# Patient Record
Sex: Male | Born: 1950 | Race: Black or African American | Hispanic: No | Smoking: Current every day smoker
Health system: Southern US, Community
[De-identification: ages and names within clinical notes are randomized; demographics above are authoritative.]

## PROBLEM LIST (undated history)

## (undated) DIAGNOSIS — Z87442 Personal history of urinary calculi: Secondary | ICD-10-CM

## (undated) DIAGNOSIS — M545 Low back pain, unspecified: Secondary | ICD-10-CM

## (undated) DIAGNOSIS — E7800 Pure hypercholesterolemia, unspecified: Secondary | ICD-10-CM

## (undated) DIAGNOSIS — I219 Acute myocardial infarction, unspecified: Secondary | ICD-10-CM

## (undated) DIAGNOSIS — I10 Essential (primary) hypertension: Secondary | ICD-10-CM

## (undated) DIAGNOSIS — I509 Heart failure, unspecified: Secondary | ICD-10-CM

## (undated) DIAGNOSIS — C3490 Malignant neoplasm of unspecified part of unspecified bronchus or lung: Secondary | ICD-10-CM

## (undated) DIAGNOSIS — Z95810 Presence of automatic (implantable) cardiac defibrillator: Secondary | ICD-10-CM

## (undated) DIAGNOSIS — N289 Disorder of kidney and ureter, unspecified: Secondary | ICD-10-CM

## (undated) DIAGNOSIS — I5020 Unspecified systolic (congestive) heart failure: Secondary | ICD-10-CM

## (undated) DIAGNOSIS — E119 Type 2 diabetes mellitus without complications: Secondary | ICD-10-CM

## (undated) DIAGNOSIS — Z720 Tobacco use: Secondary | ICD-10-CM

## (undated) DIAGNOSIS — Z923 Personal history of irradiation: Secondary | ICD-10-CM

## (undated) DIAGNOSIS — I209 Angina pectoris, unspecified: Secondary | ICD-10-CM

## (undated) DIAGNOSIS — G8929 Other chronic pain: Secondary | ICD-10-CM

## (undated) HISTORY — PX: CARDIAC CATHETERIZATION: SHX172

## (undated) HISTORY — PX: KNEE SURGERY: SHX244

## (undated) HISTORY — PX: INGUINAL HERNIA REPAIR: SUR1180

---

## 1980-12-14 DIAGNOSIS — Z87442 Personal history of urinary calculi: Secondary | ICD-10-CM

## 1980-12-14 HISTORY — DX: Personal history of urinary calculi: Z87.442

## 1998-10-29 ENCOUNTER — Inpatient Hospital Stay (HOSPITAL_COMMUNITY): Admission: EM | Admit: 1998-10-29 | Discharge: 1998-11-04 | Payer: Self-pay | Admitting: Emergency Medicine

## 1998-10-29 ENCOUNTER — Encounter: Payer: Self-pay | Admitting: Emergency Medicine

## 1999-03-08 ENCOUNTER — Emergency Department (HOSPITAL_COMMUNITY): Admission: EM | Admit: 1999-03-08 | Discharge: 1999-03-08 | Payer: Self-pay | Admitting: Emergency Medicine

## 1999-03-26 ENCOUNTER — Emergency Department (HOSPITAL_COMMUNITY): Admission: EM | Admit: 1999-03-26 | Discharge: 1999-03-26 | Payer: Self-pay | Admitting: Emergency Medicine

## 1999-03-26 ENCOUNTER — Encounter: Payer: Self-pay | Admitting: Emergency Medicine

## 1999-08-25 ENCOUNTER — Emergency Department (HOSPITAL_COMMUNITY): Admission: EM | Admit: 1999-08-25 | Discharge: 1999-08-25 | Payer: Self-pay | Admitting: Emergency Medicine

## 1999-08-27 ENCOUNTER — Emergency Department (HOSPITAL_COMMUNITY): Admission: EM | Admit: 1999-08-27 | Discharge: 1999-08-27 | Payer: Self-pay | Admitting: Emergency Medicine

## 2000-08-18 ENCOUNTER — Encounter: Payer: Self-pay | Admitting: Occupational Medicine

## 2000-08-18 ENCOUNTER — Encounter: Admission: RE | Admit: 2000-08-18 | Discharge: 2000-08-18 | Payer: Self-pay | Admitting: Occupational Medicine

## 2001-02-01 ENCOUNTER — Ambulatory Visit (HOSPITAL_BASED_OUTPATIENT_CLINIC_OR_DEPARTMENT_OTHER): Admission: RE | Admit: 2001-02-01 | Discharge: 2001-02-01 | Payer: Self-pay | Admitting: Orthopedic Surgery

## 2002-06-14 ENCOUNTER — Emergency Department (HOSPITAL_COMMUNITY): Admission: EM | Admit: 2002-06-14 | Discharge: 2002-06-14 | Payer: Self-pay | Admitting: Emergency Medicine

## 2004-10-19 ENCOUNTER — Emergency Department (HOSPITAL_COMMUNITY): Admission: EM | Admit: 2004-10-19 | Discharge: 2004-10-20 | Payer: Self-pay | Admitting: Emergency Medicine

## 2004-10-19 IMAGING — CR DG CERVICAL SPINE COMPLETE 4+V
7 series · 7 of 7 positions shown · non-contrast
Comparison: None.

CLINICAL DATA: Left arm and shoulder pain.  No history of injury. 
 CERVICAL SPINE, COMPLETE [DATE]:

[view not recorded (1 of 7)]
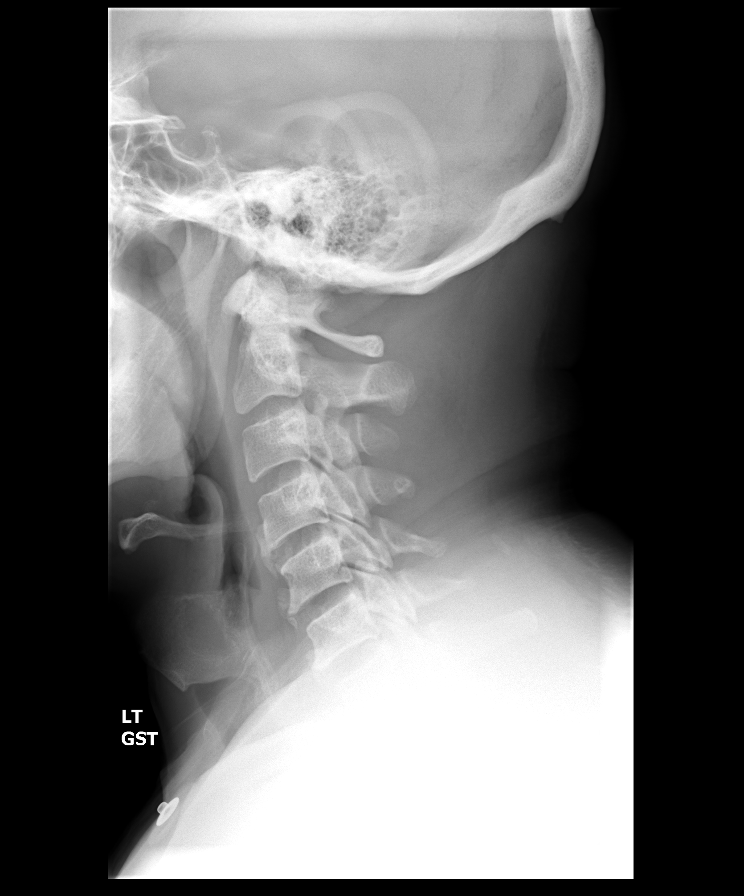

[view not recorded (2 of 7)]
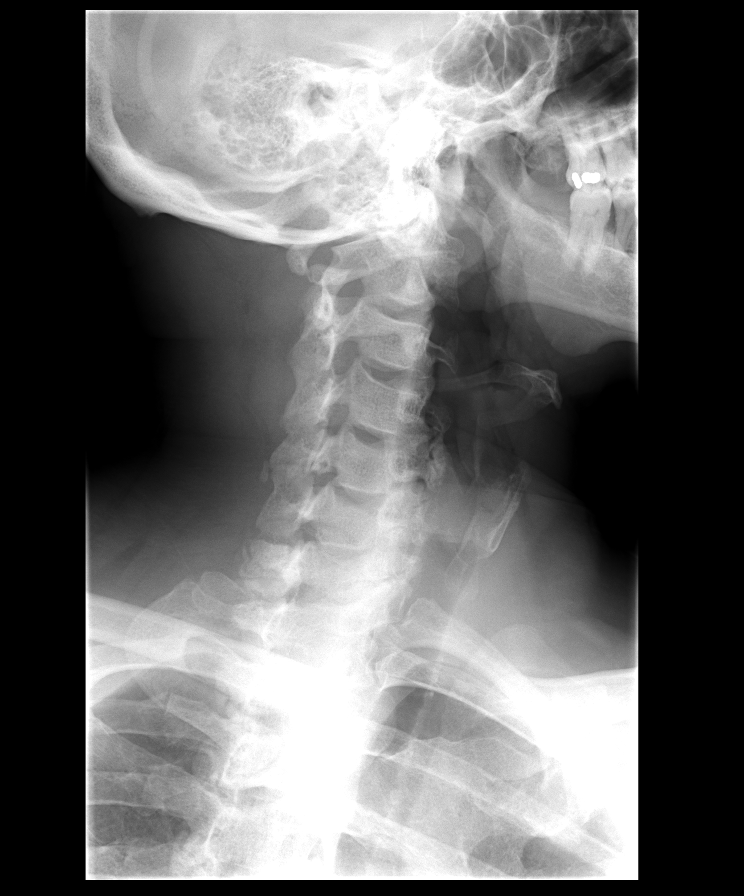

[view not recorded (3 of 7)]
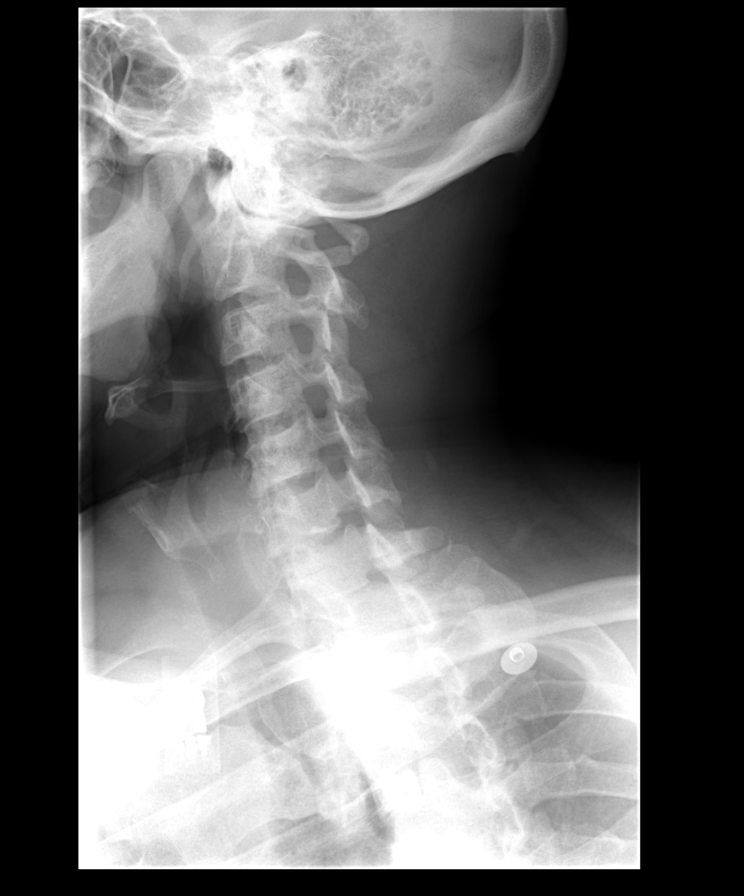

[view not recorded (4 of 7)]
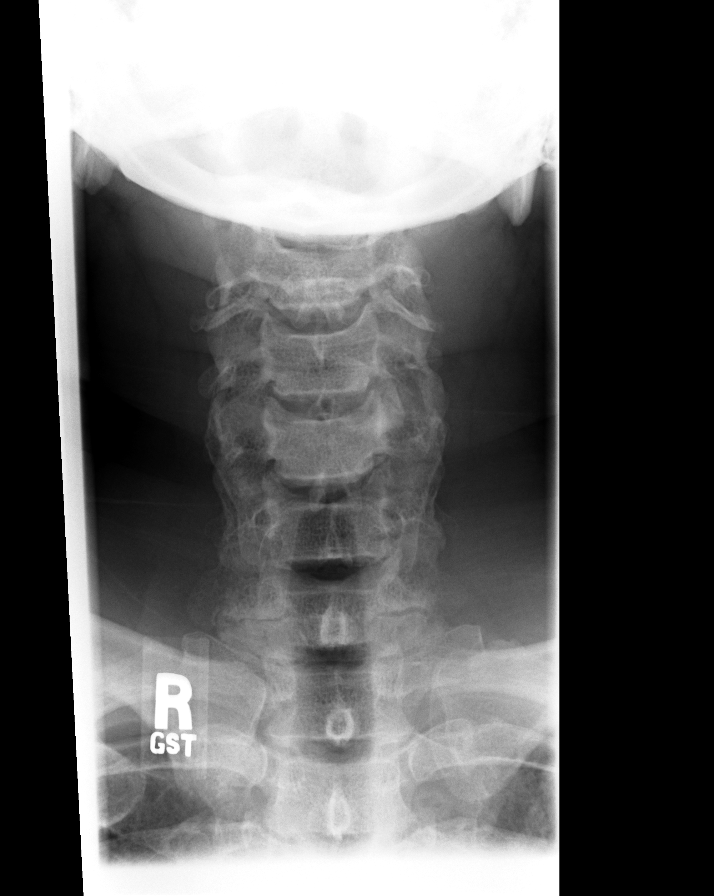

[view not recorded (5 of 7)]
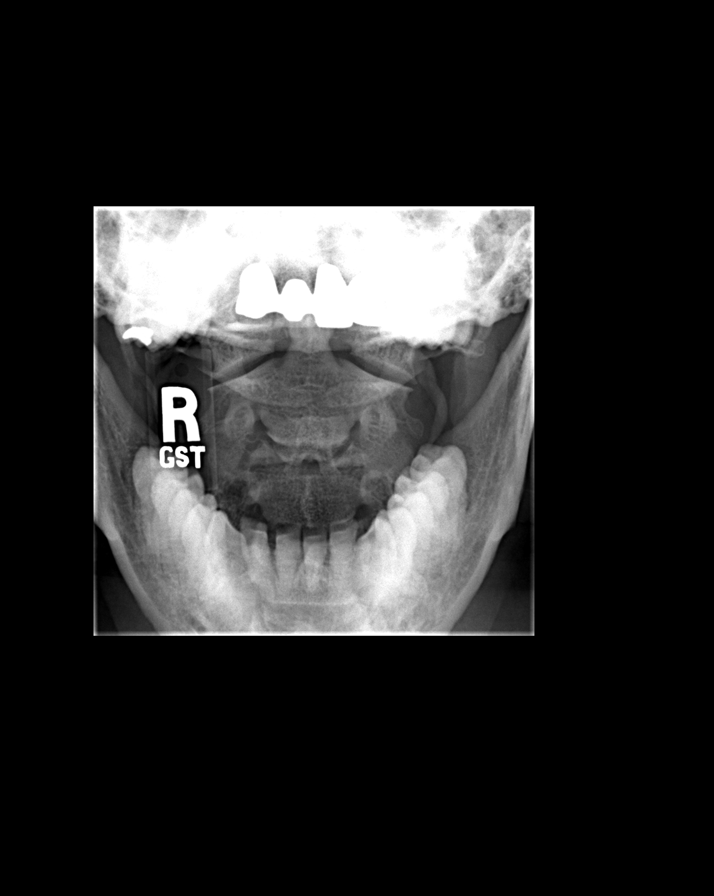

[view not recorded (6 of 7)]
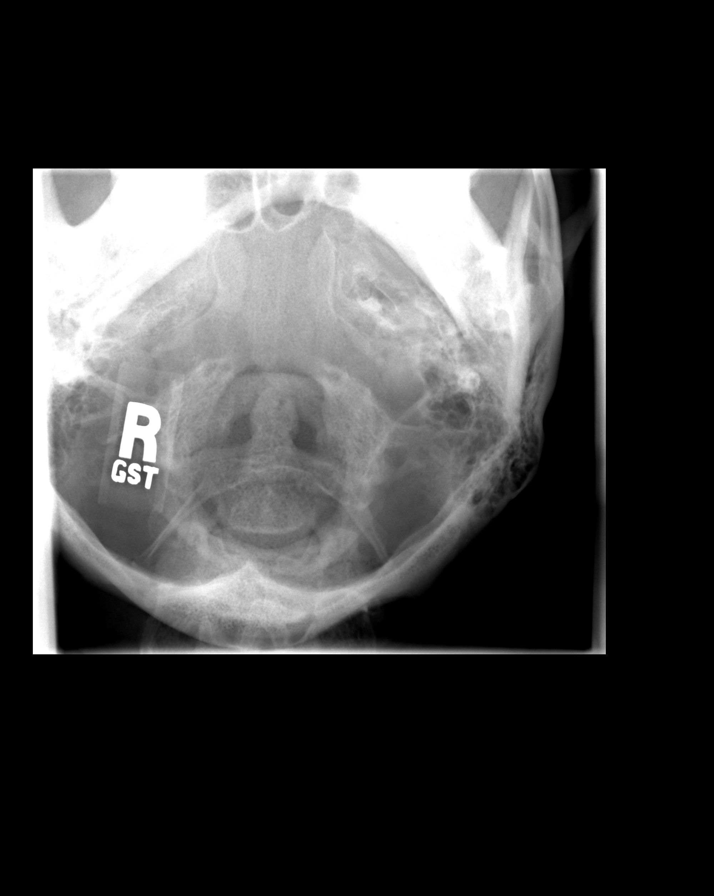

[view not recorded (7 of 7)]
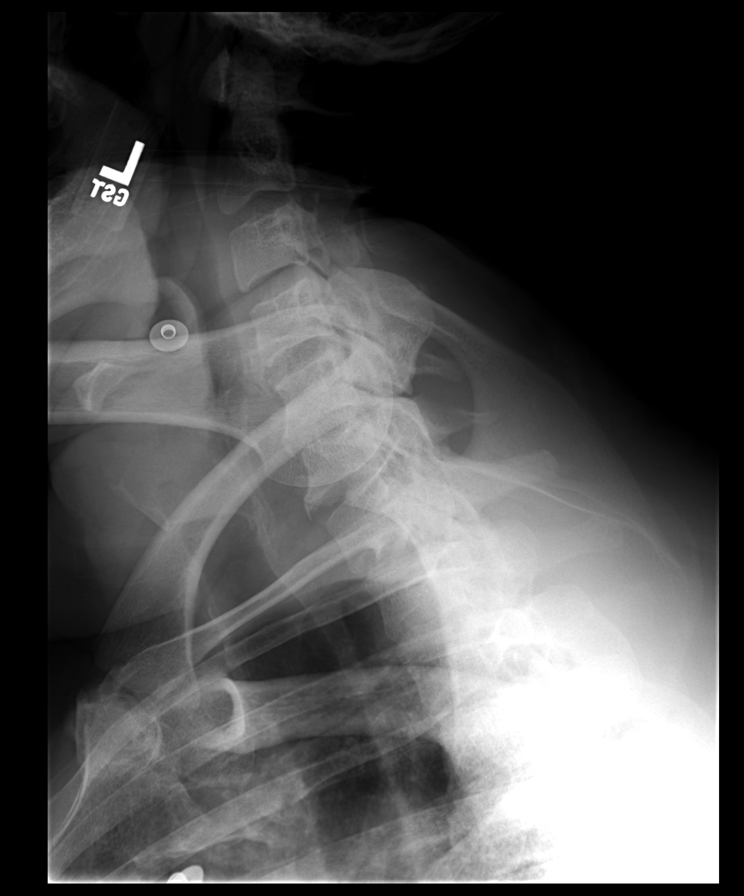

[7 of 7 positions shown; findings below may reference images not displayed]

FINDINGS: Five-view exam of the cervical spine shows no evidence for an acute fracture or subluxation.  Alignment is normal.  The intervertebral disc spaces are preserved.  Facet degeneration characterizes the lower cervical spine.  There may be narrowing of the right C5-6 and C6-7 neural foramen, although the apparent narrowing may be related to patient position.  The left neural foramen appear patent.  
 No evidence for prevertebral soft tissue thickening.
IMPRESSION: No acute bony abnormality.

## 2004-10-19 IMAGING — CR DG CHEST 2V
2 series · 2 of 2 positions shown · non-contrast
Comparison: None.

CLINICAL DATA: Shortness of breath and left shoulder pain.

[view not recorded (1 of 2)]
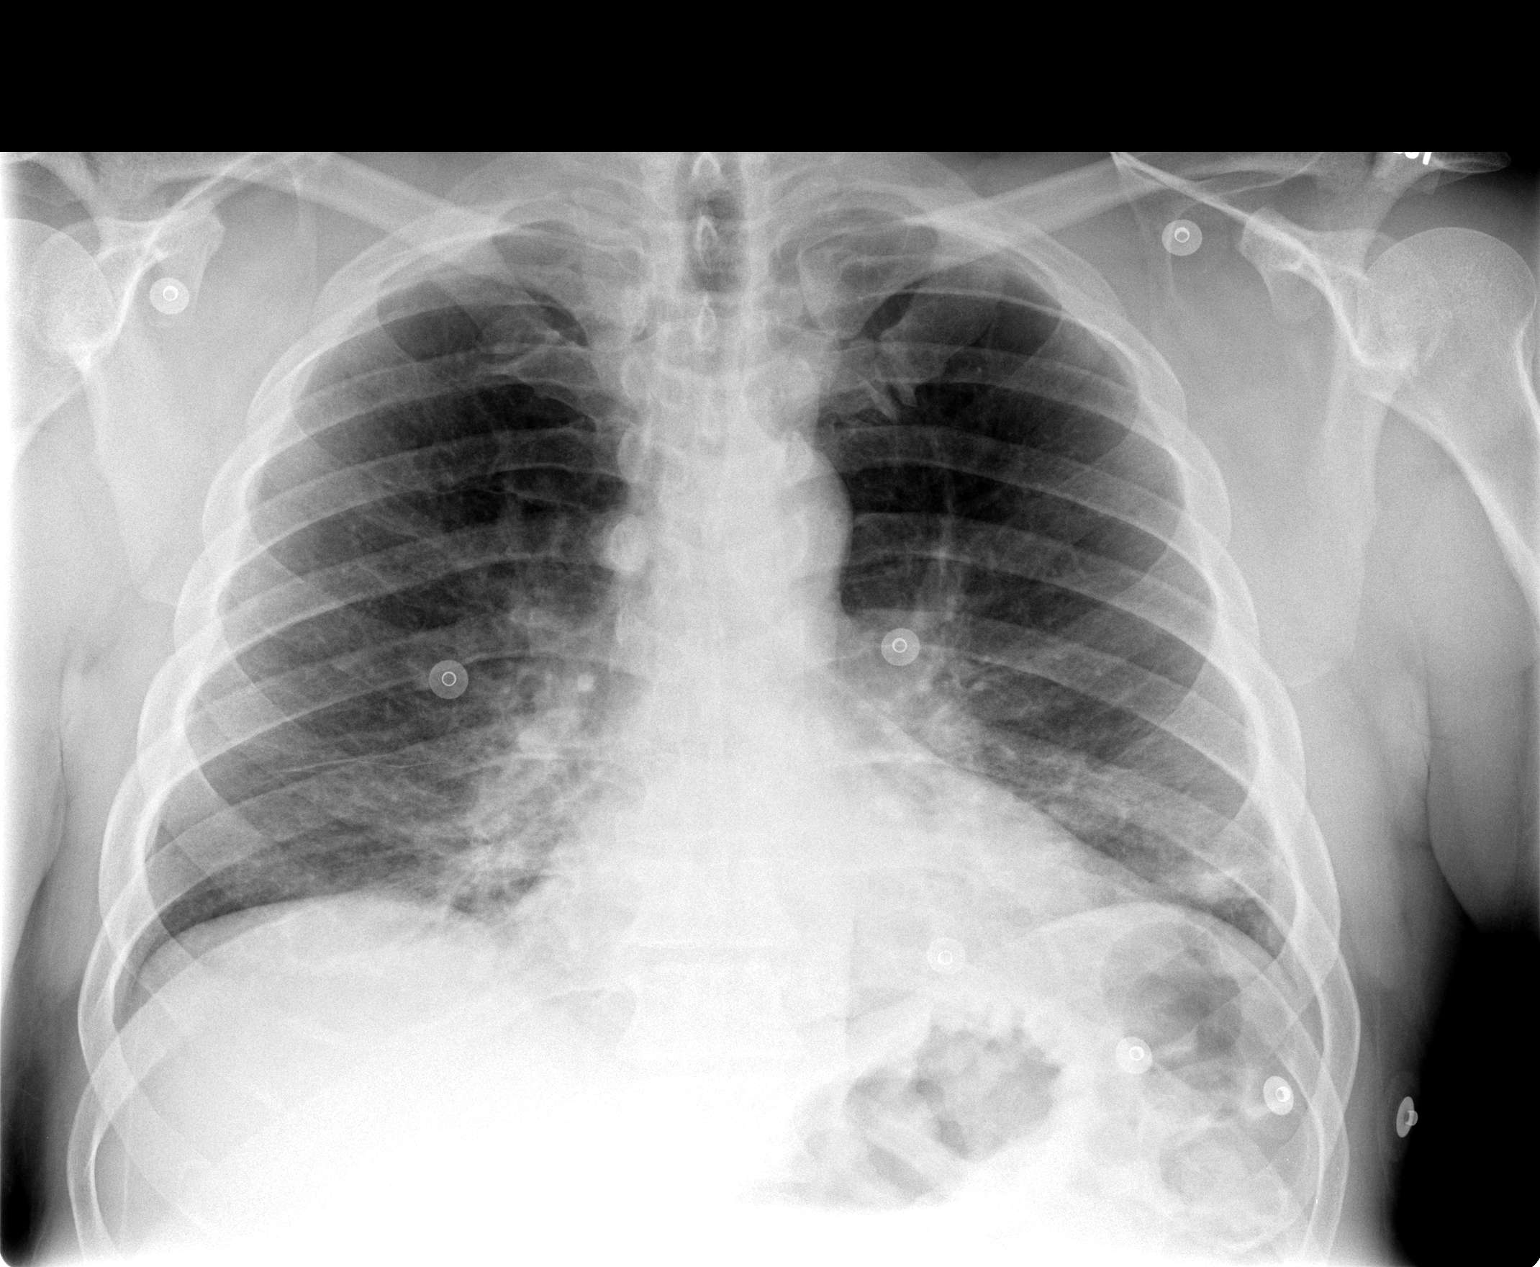

[view not recorded (2 of 2)]
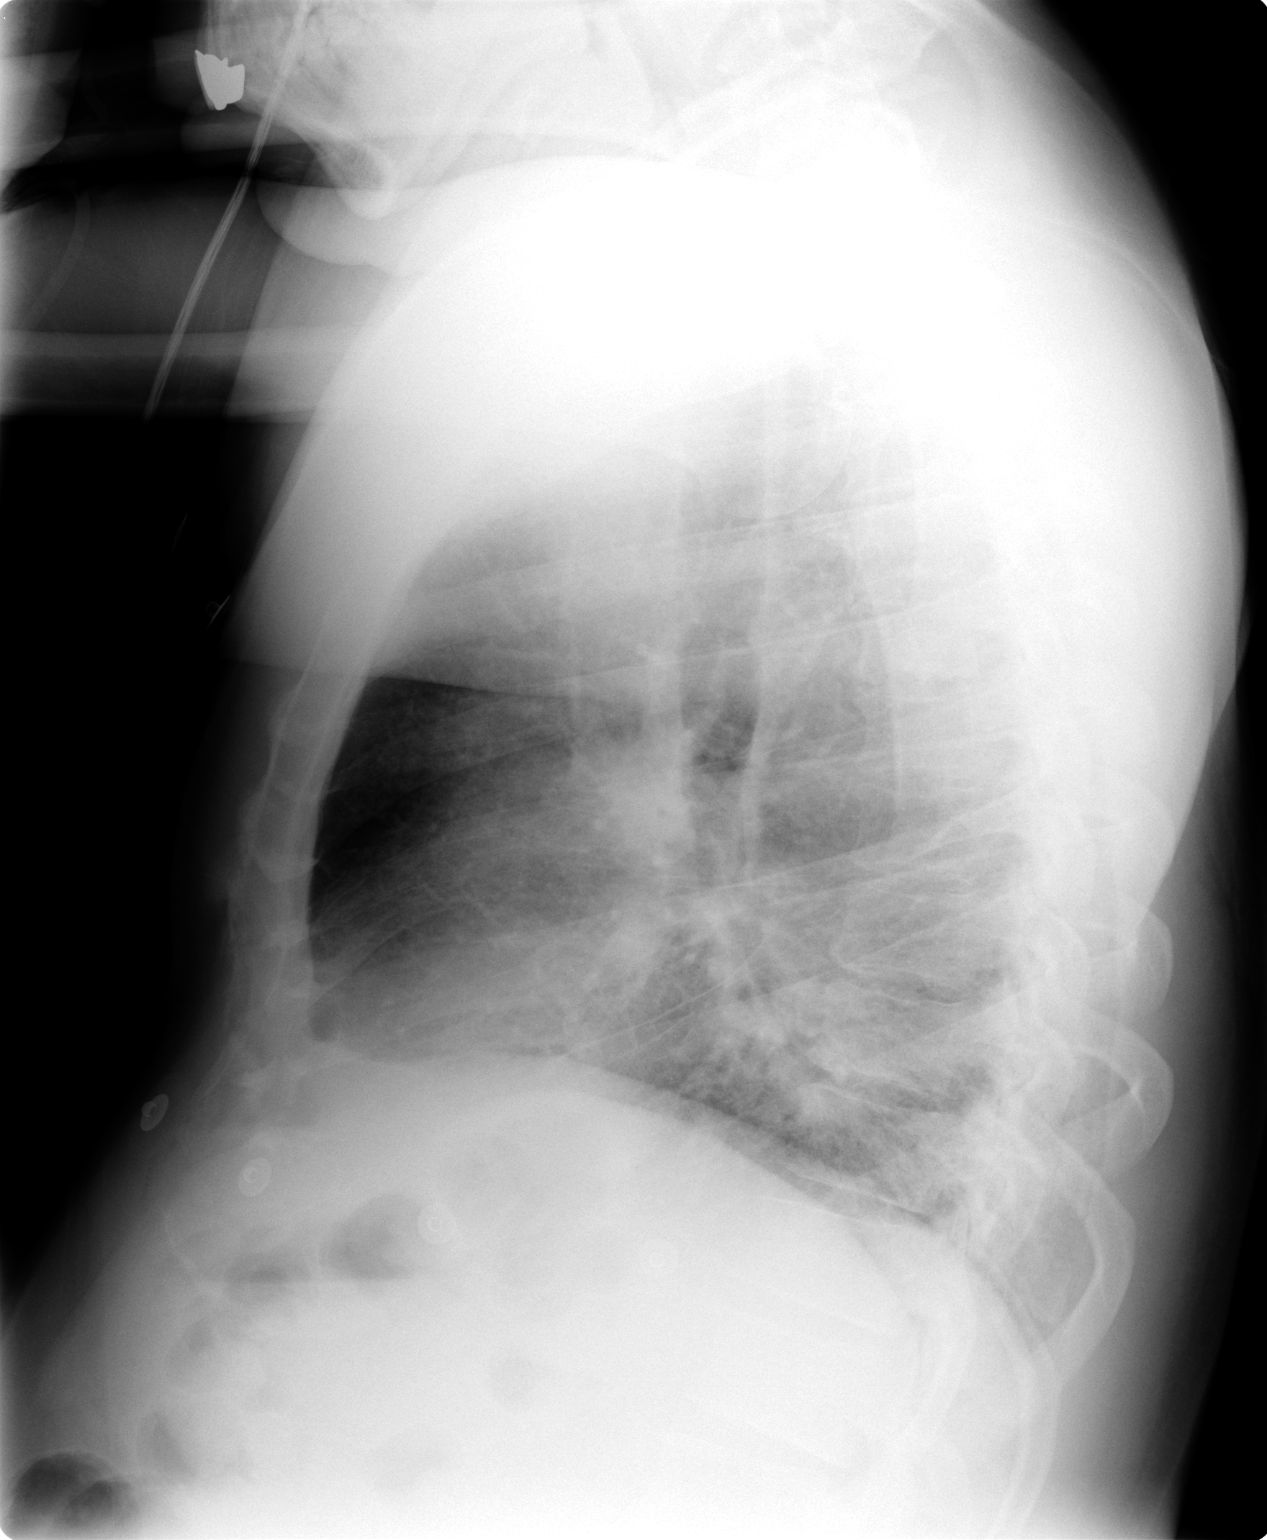

[2 of 2 positions shown; findings below may reference images not displayed]

CHEST - 2 VIEW:
 Two view chest shows low volumes with bibasilar atelectasis.  Nodular component to the opacity at the left base noted.  Low volumes accentuate the cardiac silhouette.  Imaged bony structures are unremarkable.  
 Lateral view shows an apparent 5 mm nodular opacity projecting at the posterior wall of the distal trachea.
IMPRESSION: 1.  Low volume film with bibasilar atelectasis.  There is a more nodular component to the changes in the left base and follow-up two-view chest x-ray after resolution of the patient?s acute symptoms is recommended to exclude an underlying parenchymal nodule.  
 2.  Question nodular opacity along the posterior wall of the distal trachea.  This could also be reassessed at the time of short-term interval follow-up chest x-ray.

## 2004-12-11 ENCOUNTER — Ambulatory Visit (HOSPITAL_COMMUNITY): Admission: RE | Admit: 2004-12-11 | Discharge: 2004-12-11 | Payer: Self-pay | Admitting: Family Medicine

## 2005-04-06 ENCOUNTER — Encounter (INDEPENDENT_AMBULATORY_CARE_PROVIDER_SITE_OTHER): Payer: Self-pay | Admitting: *Deleted

## 2005-04-06 ENCOUNTER — Ambulatory Visit (HOSPITAL_COMMUNITY): Admission: RE | Admit: 2005-04-06 | Discharge: 2005-04-06 | Payer: Self-pay | Admitting: *Deleted

## 2005-09-10 ENCOUNTER — Emergency Department (HOSPITAL_COMMUNITY): Admission: EM | Admit: 2005-09-10 | Discharge: 2005-09-10 | Payer: Self-pay | Admitting: Emergency Medicine

## 2006-03-21 ENCOUNTER — Emergency Department (HOSPITAL_COMMUNITY): Admission: AD | Admit: 2006-03-21 | Discharge: 2006-03-21 | Payer: Self-pay | Admitting: Family Medicine

## 2006-07-04 ENCOUNTER — Emergency Department (HOSPITAL_COMMUNITY): Admission: EM | Admit: 2006-07-04 | Discharge: 2006-07-04 | Payer: Self-pay | Admitting: Emergency Medicine

## 2010-01-13 ENCOUNTER — Emergency Department (HOSPITAL_COMMUNITY): Admission: EM | Admit: 2010-01-13 | Discharge: 2010-01-13 | Payer: Self-pay | Admitting: Emergency Medicine

## 2010-03-03 ENCOUNTER — Emergency Department (HOSPITAL_COMMUNITY): Admission: EM | Admit: 2010-03-03 | Discharge: 2010-03-03 | Payer: Self-pay | Admitting: Family Medicine

## 2010-12-23 IMAGING — CR DG HIP (WITH OR WITHOUT PELVIS) 2-3V*L*
3 series · 3 of 3 positions shown · non-contrast
Comparison: None

CLINICAL DATA: Left hip pain following injury

LEFT HIP - COMPLETE 2+ VIEW

[t pelvis a.p.]
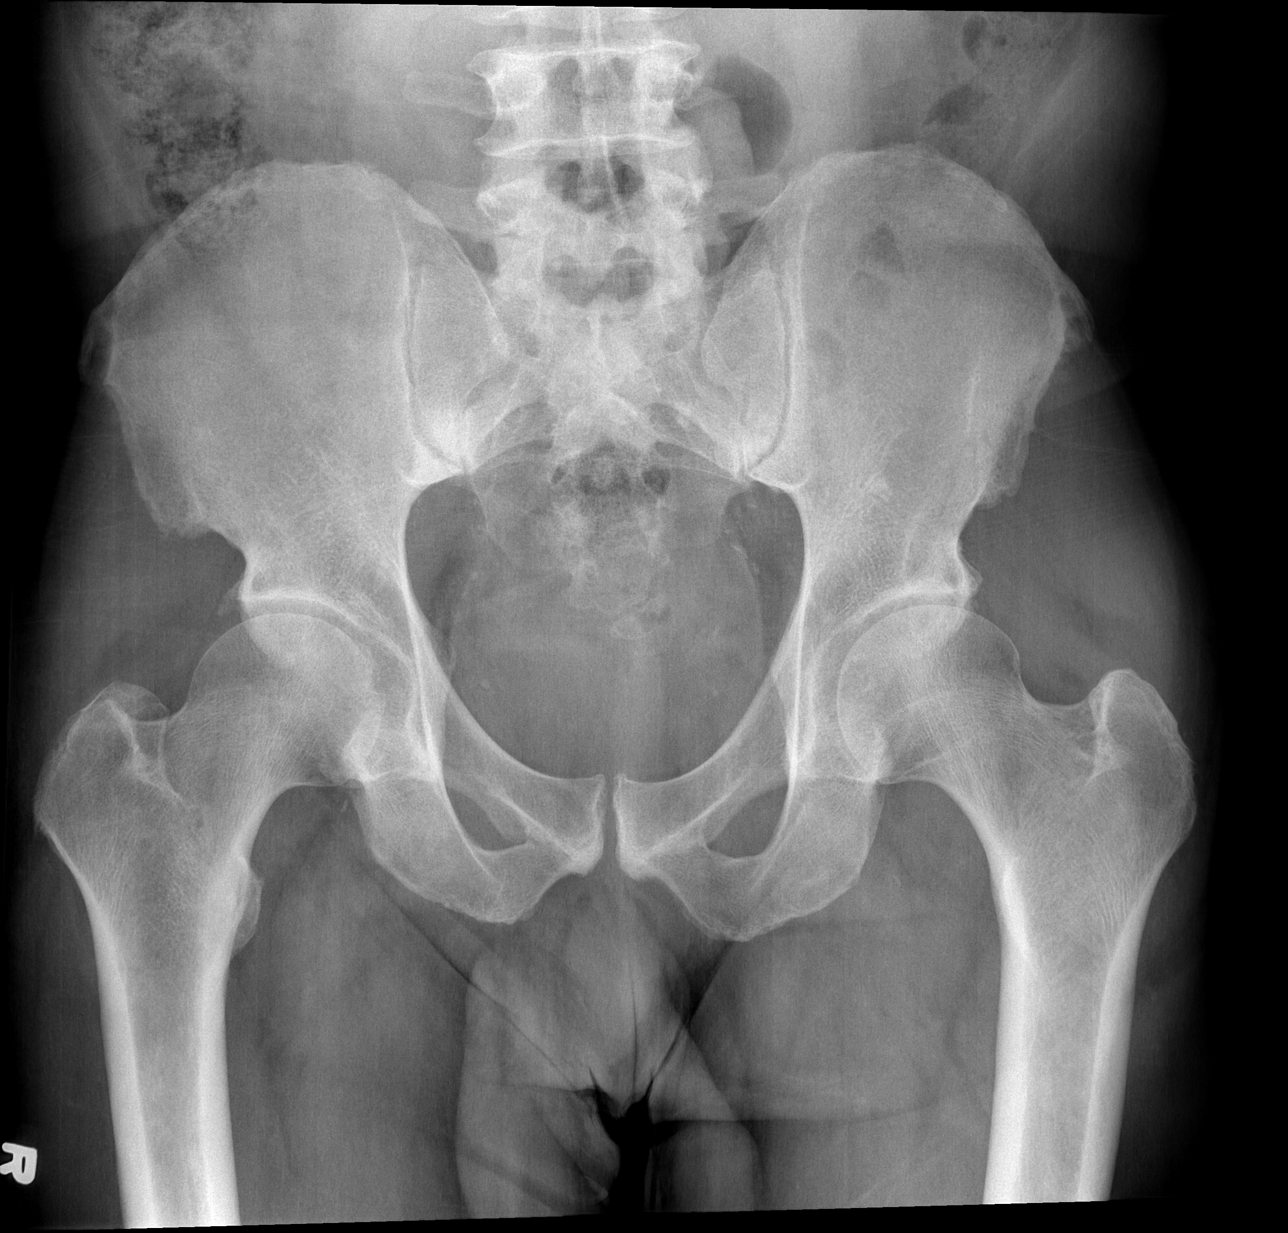

[t hip ap left]
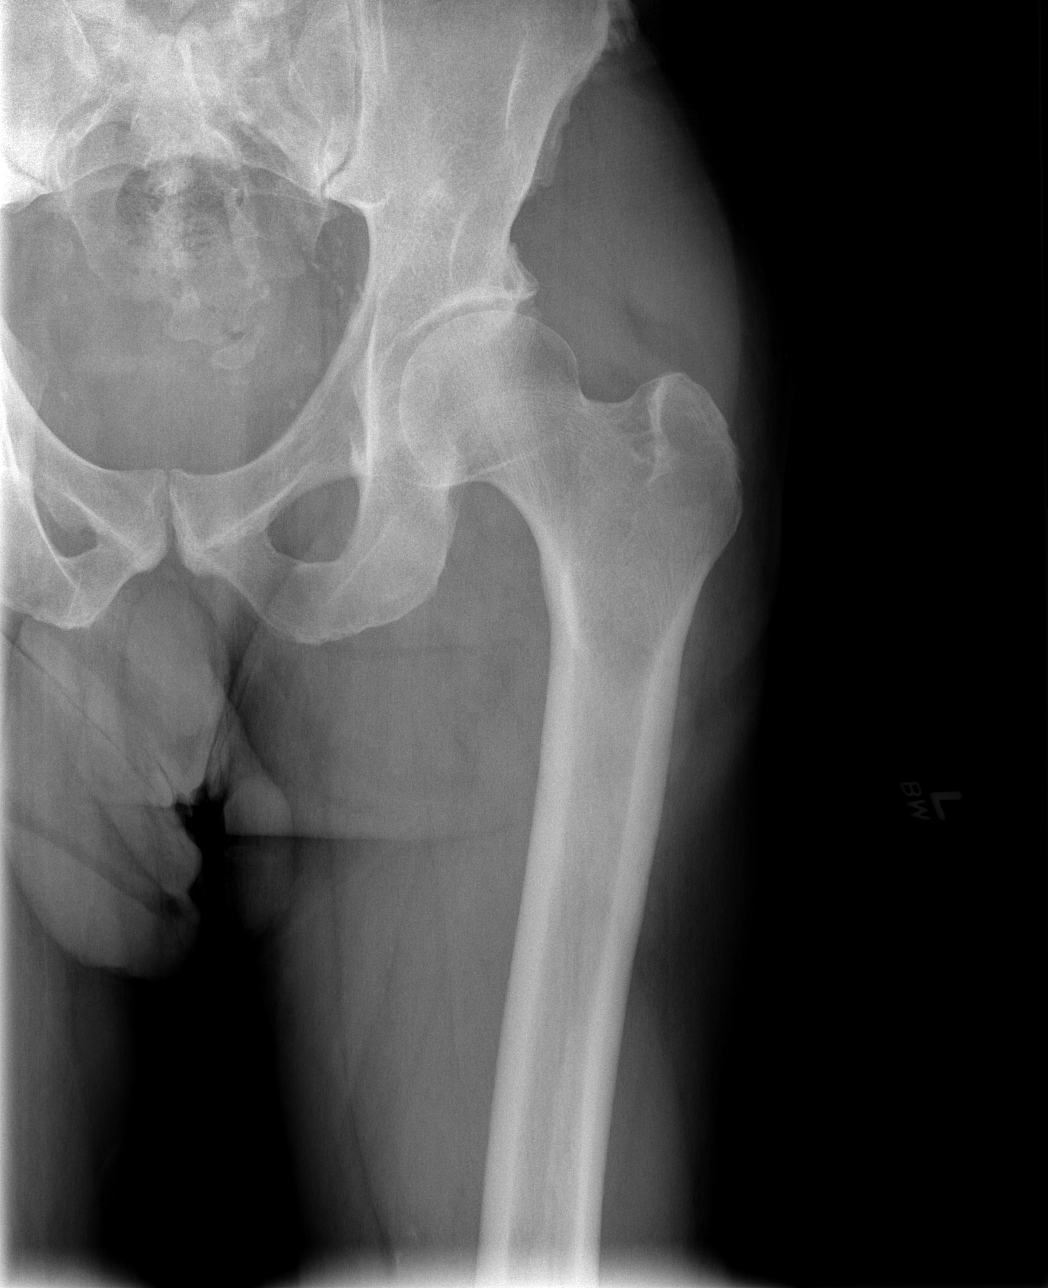

[t hip frog leg left]
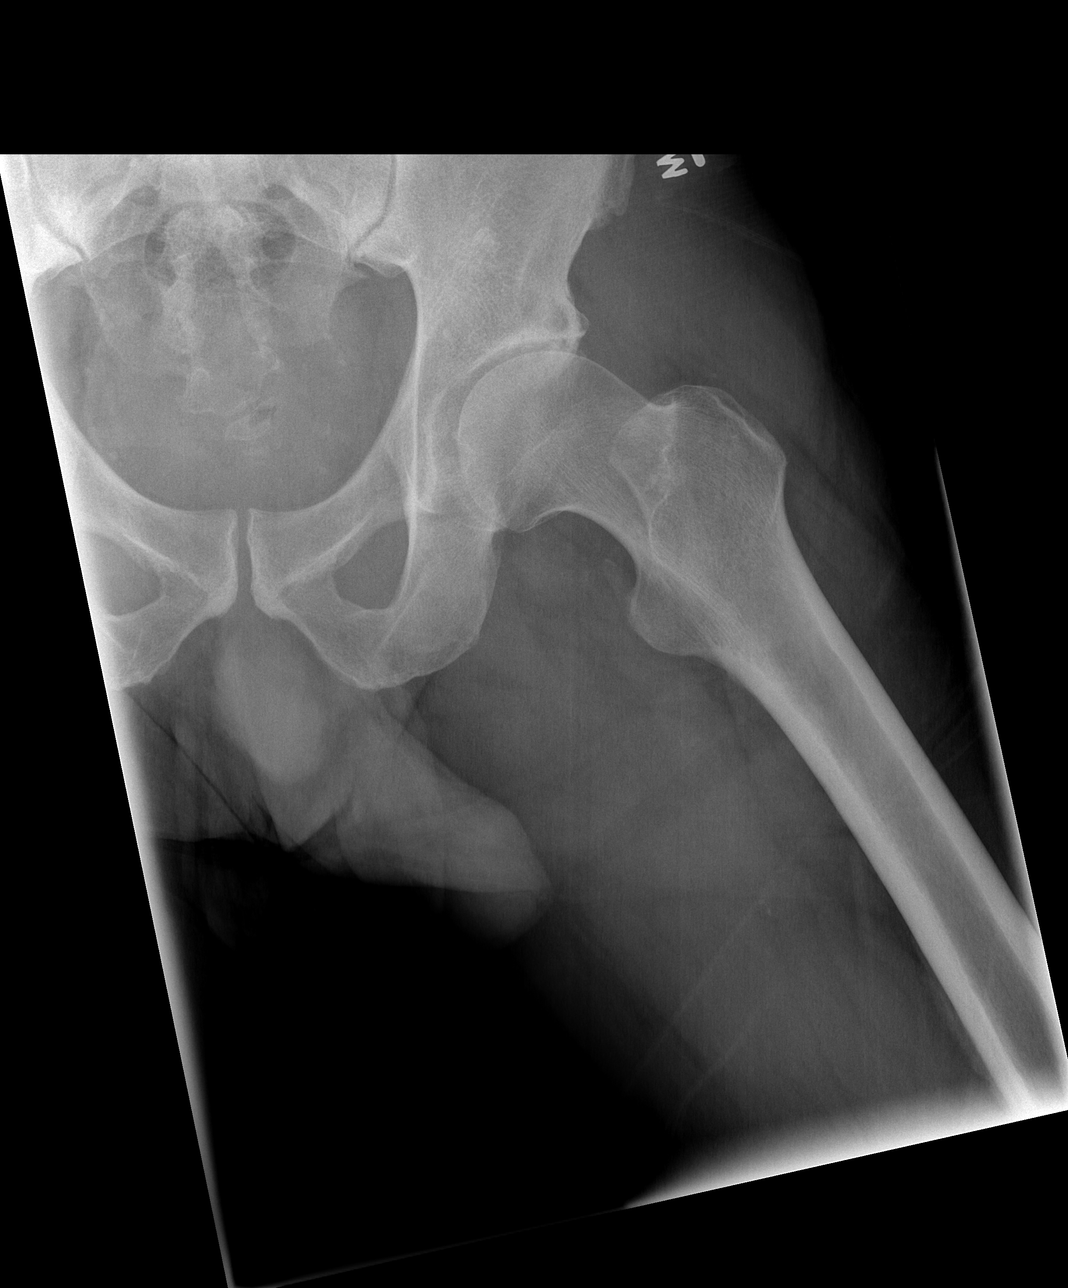

[3 of 3 positions shown; findings below may reference images not displayed]

FINDINGS: No fracture or dislocation.  Mild degenerative changes of
the hips and SI joints.
IMPRESSION: No acute findings.

## 2011-02-19 ENCOUNTER — Inpatient Hospital Stay (INDEPENDENT_AMBULATORY_CARE_PROVIDER_SITE_OTHER)
Admission: RE | Admit: 2011-02-19 | Discharge: 2011-02-19 | Disposition: A | Discharge status: Home / Self Care | Payer: BC Managed Care – PPO | Source: Ambulatory Visit | Attending: Family Medicine | Admitting: Family Medicine

## 2011-02-19 DIAGNOSIS — L723 Sebaceous cyst: Secondary | ICD-10-CM

## 2011-05-01 NOTE — Op Note (Signed)
[facility]  Patient:    Juan Norris, Juan Norris                          MRN: [id number] Proc. Date: 02/01/01 Adm. Date:  [id number] Attending:  Susa Day                           Operative Report  PREOPERATIVE DIAGNOSIS:  Chronic entrapment neuropathy, median nerve, right carpal tunnel.  POSTOPERATIVE DIAGNOSIS:  Chronic entrapment neuropathy, median nerve, right carpal tunnel.  OPERATION PERFORMED:  Release of right transverse carpal ligament.  OPERATING SURGEON:  Josephine Igo, M.D.  ASSISTANT:  Jonni Sanger, P.A.  ANESTHESIA:  General by LMA.  ANESTHESIOLOGIST:  Dr. Krista Blue.  INDICATIONS:  Juan Norris is a 60 year old gentleman who has been employed in Restaurant manager, fast food for years.  He had noted numbness in his hand at night followed by the onset of impaired prehension.  Clinical examination suggests entrapment neuropathy of the median nerve.  Electrodiagnostic studies confirmed median neuropathy.  Due to failure of nonoperative measures, the patient is brought to the operating room at this time for release of his right transverse carpal ligament.  DESCRIPTION OF PROCEDURE:  Juan Norris was brought to the operating room and placed in supine position on the operating table.  Following induction of general anesthesia, the right arm was prepped with Betadine soap and solution and sterilely draped.  Following exsanguination of the limb with an Esmarch bandage, the arterial tourniquet was inflated to 220 mmHg.  The procedure commenced with a short incision in line of the ring finger in the palm.  The subcutaneous tissues were carefully divided revealing the palmar fascia.  This was split longitudinally to reveal the common sensory branch of the median nerve.  These were followed back to the transverse carpal ligament which was carefully separated from the median nerve.  The ligament was released on its ulnar border extending  into the distal forearm.  This widely opened the carpal canal.  No masses or other predicaments were noted.  Bleeding points along the margin of the released ligament were electrocauterized with bipolar current followed by repair of the skin with intradermal 3-0 Prolene suture.  A compressive dressing was applied with a volar plaster splint maintaining the wrist in 5 degrees dorsiflexion.  For aftercare, Juan Norris is given prescription for Mepergan Fortis 24 tablets 1 or 2 p.o. q.4-6h. p.r.n. pain without refill. DD:  02/01/01 TD:  02/01/01 Job: [id number]

## 2012-04-07 ENCOUNTER — Observation Stay (HOSPITAL_COMMUNITY)
Admission: EM | Admit: 2012-04-07 | Discharge: 2012-04-08 | Disposition: A | Discharge status: Home / Self Care | Payer: BC Managed Care – PPO | Attending: Interventional Cardiology | Admitting: Interventional Cardiology

## 2012-04-07 ENCOUNTER — Emergency Department (HOSPITAL_COMMUNITY): Payer: BC Managed Care – PPO

## 2012-04-07 ENCOUNTER — Encounter (HOSPITAL_COMMUNITY): Payer: Self-pay

## 2012-04-07 DIAGNOSIS — I252 Old myocardial infarction: Secondary | ICD-10-CM

## 2012-04-07 DIAGNOSIS — I428 Other cardiomyopathies: Secondary | ICD-10-CM | POA: Insufficient documentation

## 2012-04-07 DIAGNOSIS — R079 Chest pain, unspecified: Principal | ICD-10-CM

## 2012-04-07 DIAGNOSIS — F17200 Nicotine dependence, unspecified, uncomplicated: Secondary | ICD-10-CM | POA: Insufficient documentation

## 2012-04-07 DIAGNOSIS — R0602 Shortness of breath: Secondary | ICD-10-CM | POA: Insufficient documentation

## 2012-04-07 HISTORY — DX: Acute myocardial infarction, unspecified: I21.9

## 2012-04-07 LAB — CARDIAC PANEL(CRET KIN+CKTOT+MB+TROPI)
CK, MB: 3.7 ng/mL (ref 0.3–4.0)
CK, MB: 3.2 ng/mL (ref 0.3–4.0)
Relative Index: 1.9 (ref 0.0–2.5)
Relative Index: 2 (ref 0.0–2.5)
Total CK: 193 U/L (ref 7–232)
Total CK: 159 U/L (ref 7–232)
Troponin I: <0.3 ng/mL (ref <0.30)

## 2012-04-07 LAB — BASIC METABOLIC PANEL
BUN: 12 mg/dL (ref 6–23)
CO2: 23 mEq/L (ref 19–32)
GFR calc Af Amer: >90 mL/min (ref >90)
GFR calc non Af Amer: >90 mL/min (ref >90)
Glucose, Bld: 187 mg/dL — ABNORMAL HIGH (ref 70–99)
Potassium: 3.5 mEq/L (ref 3.5–5.1)
Sodium: 138 mEq/L (ref 135–145)

## 2012-04-07 LAB — COMPREHENSIVE METABOLIC PANEL
ALT: 13 U/L (ref 0–53)
AST: 23 U/L (ref 0–37)
CO2: 26 mEq/L (ref 19–32)
Chloride: 104 mEq/L (ref 96–112)
GFR calc non Af Amer: 89 mL/min — ABNORMAL LOW (ref >90)
Potassium: 3.8 mEq/L (ref 3.5–5.1)
Sodium: 139 mEq/L (ref 135–145)
Total Bilirubin: 0.4 mg/dL (ref 0.3–1.2)

## 2012-04-07 LAB — CBC
HCT: 41.9 % (ref 39.0–52.0)
MCH: 29.9 pg (ref 26.0–34.0)
MCHC: 34.4 g/dL (ref 30.0–36.0)
Platelets: 207 10*3/uL (ref 150–400)

## 2012-04-07 LAB — PROTIME-INR: INR: 1.05 (ref 0.00–1.49)

## 2012-04-07 LAB — DIFFERENTIAL
Basophils Absolute: 0 10*3/uL (ref 0.0–0.1)
Eosinophils Absolute: 0.2 10*3/uL (ref 0.0–0.7)
Eosinophils Relative: 3 % (ref 0–5)
Lymphocytes Relative: 27 % (ref 12–46)
Lymphs Abs: 2.2 10*3/uL (ref 0.7–4.0)
Monocytes Relative: 6 % (ref 3–12)
Neutrophils Relative %: 64 % (ref 43–77)

## 2012-04-07 LAB — HEPARIN LEVEL (UNFRACTIONATED): Heparin Unfractionated: 0.25 IU/mL — ABNORMAL LOW (ref 0.30–0.70)

## 2012-04-07 LAB — POCT I-STAT TROPONIN I

## 2012-04-07 IMAGING — CR DG CHEST 1V PORT
1 series · 1 of 1 positions shown · non-contrast
Comparison: [DATE]

CLINICAL DATA: Chest pain

PORTABLE CHEST - 1 VIEW

[view not recorded]
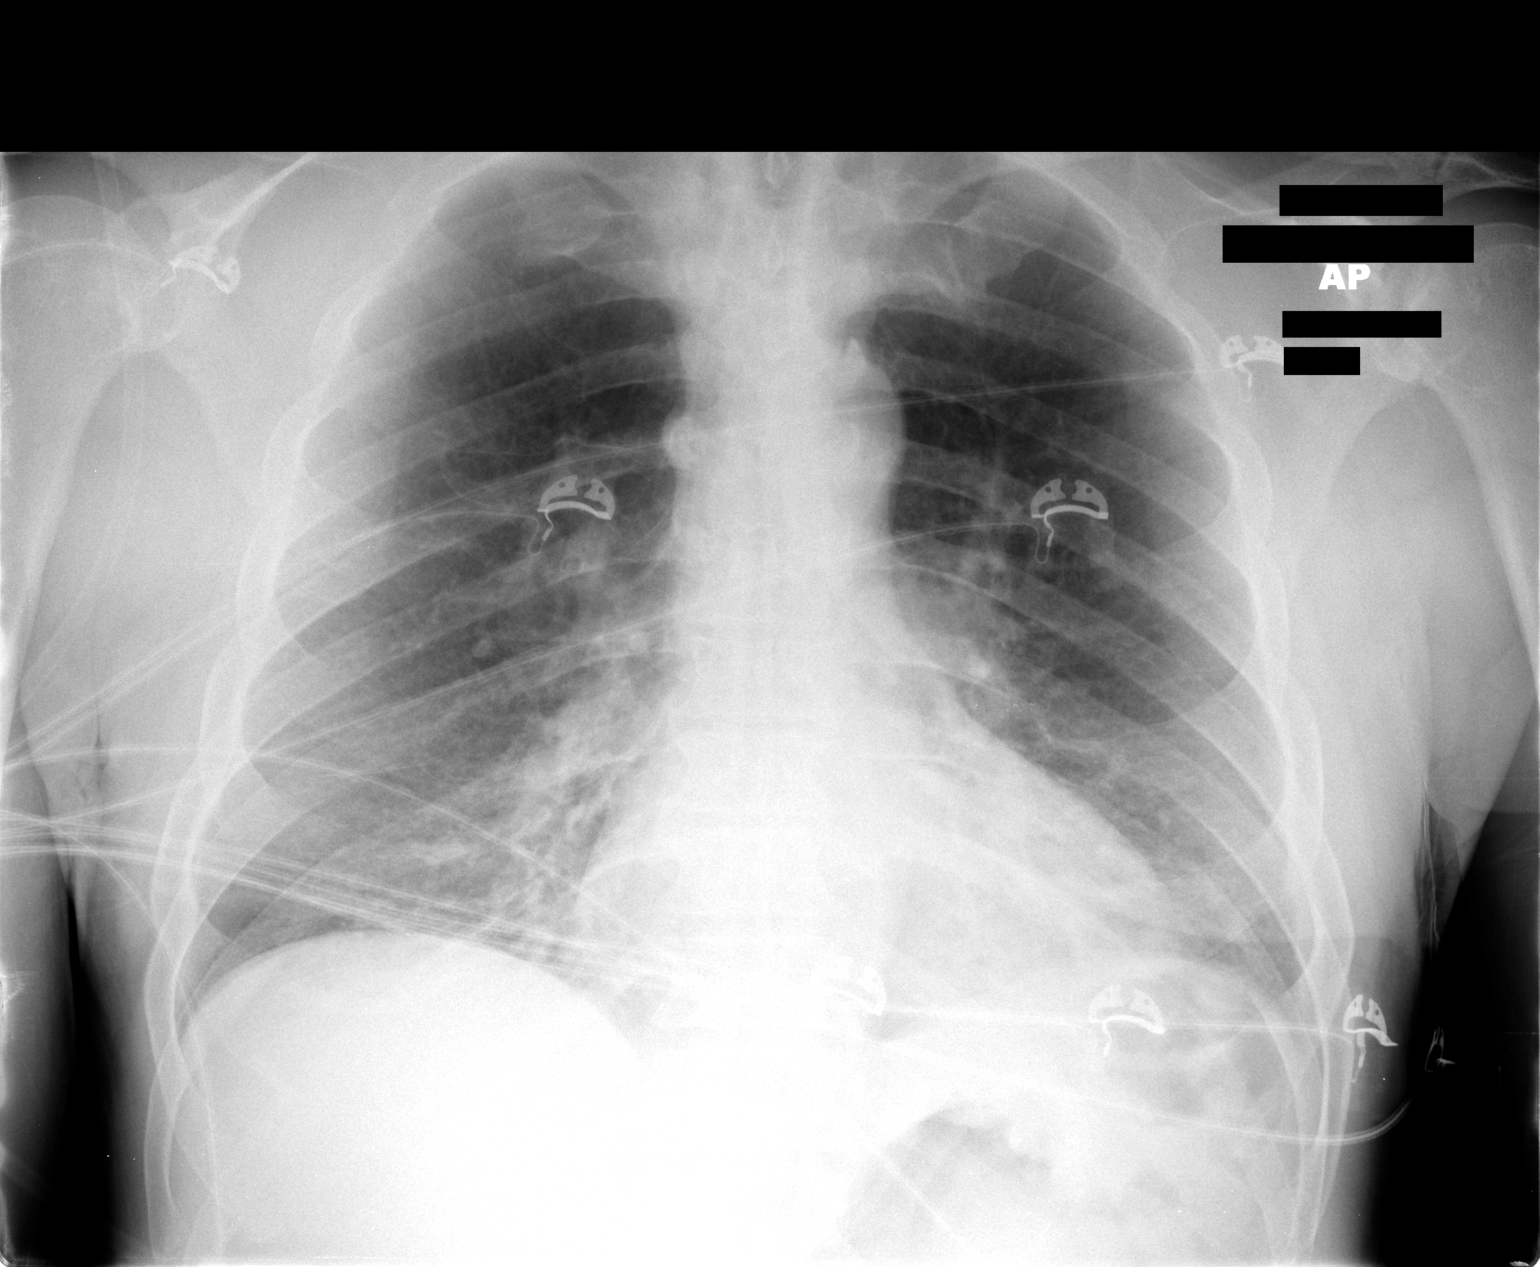

[1 of 1 positions shown; findings below may reference images not displayed]

FINDINGS: Central bibasilar airspace opacities left greater than
right have developed.  Upper lung zones clear.  Upper normal heart
size.  No pneumothorax.
IMPRESSION: Bibasilar airspace disease left greater than right.

## 2012-04-07 MED ORDER — NITROGLYCERIN 2 % TD OINT
1.0000 [in_us] | TOPICAL_OINTMENT | Freq: Once | TRANSDERMAL | Status: AC
  Administered 2012-04-07: 1 [in_us] via TOPICAL
  Filled 2012-04-07: qty 1

## 2012-04-07 MED ORDER — ASPIRIN EC 81 MG PO TBEC
81.0000 mg | DELAYED_RELEASE_TABLET | Freq: Every day | ORAL | Status: DC
  Administered 2012-04-08: 81 mg via ORAL
  Filled 2012-04-07: qty 1

## 2012-04-07 MED ORDER — ASPIRIN 81 MG PO CHEW: 324.0000 mg | CHEWABLE_TABLET | Freq: Once | ORAL | Status: DC

## 2012-04-07 MED ORDER — ALPRAZOLAM 0.25 MG PO TABS
0.2500 mg | ORAL_TABLET | Freq: Two times a day (BID) | ORAL | Status: DC | PRN
  Administered 2012-04-08: 0.25 mg via ORAL
  Filled 2012-04-07: qty 1

## 2012-04-07 MED ORDER — METOPROLOL TARTRATE 12.5 MG HALF TABLET
12.5000 mg | ORAL_TABLET | Freq: Two times a day (BID) | ORAL | Status: DC
  Administered 2012-04-07 – 2012-04-08 (×2): 12.5 mg via ORAL
  Filled 2012-04-07 (×3): qty 1

## 2012-04-07 MED ORDER — ASPIRIN 300 MG RE SUPP
300.0000 mg | RECTAL | Status: DC
  Filled 2012-04-07: qty 1

## 2012-04-07 MED ORDER — NITROGLYCERIN 0.4 MG SL SUBL: 0.4000 mg | SUBLINGUAL_TABLET | SUBLINGUAL | Status: DC | PRN

## 2012-04-07 MED ORDER — SODIUM CHLORIDE 0.9 % IV SOLN: 250.0000 mL | INTRAVENOUS | Status: DC | PRN

## 2012-04-07 MED ORDER — HEPARIN BOLUS VIA INFUSION
4000.0000 [IU] | Freq: Once | INTRAVENOUS | Status: AC
  Administered 2012-04-07: 4000 [IU] via INTRAVENOUS

## 2012-04-07 MED ORDER — NITROGLYCERIN 2 % TD OINT
0.5000 [in_us] | TOPICAL_OINTMENT | Freq: Four times a day (QID) | TRANSDERMAL | Status: DC
  Administered 2012-04-07 – 2012-04-08 (×3): 0.5 [in_us] via TOPICAL
  Filled 2012-04-07: qty 30

## 2012-04-07 MED ORDER — SODIUM CHLORIDE 0.9 % IJ SOLN: 3.0000 mL | INTRAMUSCULAR | Status: DC | PRN

## 2012-04-07 MED ORDER — SIMVASTATIN 40 MG PO TABS
40.0000 mg | ORAL_TABLET | Freq: Every day | ORAL | Status: DC
  Filled 2012-04-07: qty 1

## 2012-04-07 MED ORDER — HEPARIN (PORCINE) IN NACL 100-0.45 UNIT/ML-% IJ SOLN
1300.0000 [IU]/h | INTRAMUSCULAR | Status: DC
  Administered 2012-04-07: 1000 [IU]/h via INTRAVENOUS
  Administered 2012-04-08: 1300 [IU]/h via INTRAVENOUS
  Filled 2012-04-07 (×3): qty 250

## 2012-04-07 MED ORDER — SODIUM CHLORIDE 0.9 % IJ SOLN
3.0000 mL | Freq: Two times a day (BID) | INTRAMUSCULAR | Status: DC
  Administered 2012-04-08: 3 mL via INTRAVENOUS

## 2012-04-07 MED ORDER — ACETAMINOPHEN 325 MG PO TABS
650.0000 mg | ORAL_TABLET | ORAL | Status: DC | PRN
  Administered 2012-04-08: 650 mg via ORAL
  Filled 2012-04-07 (×2): qty 2

## 2012-04-07 MED ORDER — ZOLPIDEM TARTRATE 5 MG PO TABS: 10.0000 mg | ORAL_TABLET | Freq: Every evening | ORAL | Status: DC | PRN

## 2012-04-07 MED ORDER — ONDANSETRON HCL 4 MG/2ML IJ SOLN: 4.0000 mg | Freq: Four times a day (QID) | INTRAMUSCULAR | Status: DC | PRN

## 2012-04-07 NOTE — ED Notes (Signed)
Cardiology MD at bedside speaking to pt

## 2012-04-07 NOTE — ED Notes (Signed)
Pt sitting in stretcher in NAD, respirations even and unlabored. 

## 2012-04-07 NOTE — H&P (Signed)
Juan Norris is a 61 y.o. male  Admit date: 04/07/2012 Referring Physician:  [facility] Primary Cardiologist:: Gwynneth Albright, M.D. 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 He was sweeping/vacuuming when the discomfort started. There was radiation to the left arm. There was mild shortness of breath. He came to the emergency room where in transport by EMS nitroglycerin was given with gradual reduction in the discomfort. A nitroglycerin patch was started, and he is now totally pain free. He gives a history of prior heart attack in 1993 however, he was not hospitalized. He has no chronic medical illnesses and is on no medications.    PMH:    Past Medical History  Diagnosis Date  . Myocardial infarction     PSH:    Past Surgical History  Procedure Date  . Knee surgery     ALLERGIES:   Review of patient's allergies indicates no known allergies.  Prior to Admit Meds:   (Not in a hospital admission) Family HX:   History reviewed. No pertinent family history. Social HX:    History   Social History  . Marital Status: Married    Spouse Name: N/A    Number of Children: N/A  . Years of Education: N/A   Occupational History  . Not on file.   Social History Main Topics  . Smoking status: Current Everyday Smoker -- 0.5 packs/day for 40 years  . Smokeless tobacco: Not on file  . Alcohol Use: No  . Drug Use: No  . Sexually Active:    Other Topics Concern  . Not on file   Social History Narrative  . No narrative on file     ROS: No history of cancer. Denies chills and fever. Good appetite. No weight loss. No history of gastrointestinal disease. Never had a stroke. He denies peripheral edema. He has not had palpitations or syncope.  Physical Exam: Blood pressure 129/65, pulse 56, temperature 97.8 F  (36.6 C), temperature source Oral, resp. rate 14, height 5\' 6"  (1.676 m), weight 84.369 kg (186 lb), SpO2 98.00%.    Well-developed short statue African American male who is in no acute distress  HEENT exam is unremarkable.  Neck exam reveals no carotid bruits. Carotid upstrokes 2+ bilaterally.  The chest exam reveals no deformities. Lungs are clear. No wheezing or rales are heard.  Cardiac exam is unremarkable. No bruits. S4 gallop is audible. No rubs.  Abdomen soft. Bowel sounds are normal. Liver and spleen are not palpable.  Extremities revealed 2+ radial pulses bilaterally 2+ femoral pulses and 2+ posterior tibial pulses bilaterally.  The neurological exam is unremarkable.  Labs:   Lab Results  Component Value Date   WBC 8.0 04/07/2012   HGB 14.4 04/07/2012   HCT 41.9 04/07/2012   MCV 86.9 04/07/2012   PLT 207 04/07/2012     Lab 04/07/12 1504  NA 138  K 3.5  CL 103  CO2 23  BUN 12  CREATININE 0.82  CALCIUM 9.3  PROT --  BILITOT --  ALKPHOS --  ALT --  AST --  GLUCOSE 187*   Lab Results  Component Value Date   CKTOTAL 193 04/07/2012   CKMB 3.7 04/07/2012   TROPONINI <0.30 04/07/2012      Radiology:   PORTABLE CHEST - 1 VIEW  Comparison: 12/11/2004  Findings: Central bibasilar airspace opacities left greater than  right have developed. Upper lung zones clear. Upper normal heart  size. No pneumothorax.  IMPRESSION:  Bibasilar airspace disease left greater than right.  Original Report Authenticated By: Donavan Burnet, M.D.     EKG:  Normal sinus rhythm with anteroseptal infarction noted by Q waves V1 through V4. Left axis  ASSESSMENT:   1. Exertional chest discomfort, responsive to sublingual nitroglycerin and a 61 year old smoker with a history of prior "heart attack". The History around the heart attack is questionable since he was not admitted.  2. Tobacco abuse  3. Abnormal EKG with anteroseptal Q waves and left axis deviation with interventricular  conduction delay.  Plan:  1. Will obtain serial markers and EKGs.  2. Aspirin, statin therapy, monitoring, oxygen therapy, and Lovenox will be started.  3. Will need cath or stress test based upon data base. [name] 04/07/2012 7:04 PM

## 2012-04-07 NOTE — ED Provider Notes (Signed)
History     CSN: [id number]  Arrival date & time 04/07/12  1444   First MD Initiated Contact with Patient 04/07/12 1455      Chief Complaint  Patient presents with  . 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 The pain was sharp and constant and did not radiate. Resolved after EMS gave him nitroglycerin. Denies any shortness of breath, nausea diaphoresis. He denies any medical problems and takes no medications. He states he may have had a heart attack in 1993 he was just observed in the ER and sent home. he has never seen a cardiologist. He is pain-free at this time to a lesser in. He feels back to his baseline denies any shortness of breath, nausea, diaphoresis, chills or vomiting.  The history is provided by the patient and the EMS personnel.    Past Medical History  Diagnosis Date  . Myocardial infarction     Past Surgical History  Procedure Date  . Knee surgery     History reviewed. No pertinent family history.  History  Substance Use Topics  . Smoking status: Current Everyday Smoker -- 0.5 packs/day for 40 years  . Smokeless tobacco: Not on file  . Alcohol Use: No      Review of Systems  Constitutional: Negative for activity change and appetite change.  Respiratory: Positive for chest tightness and shortness of breath.   Cardiovascular: Positive for chest pain.  Gastrointestinal: Negative for nausea, vomiting and abdominal pain.  Genitourinary: Negative for dysuria.  Musculoskeletal: Negative for back pain.  Neurological: Negative for headaches.    Allergies  Review of patient's allergies indicates no known allergies.  Home Medications  No current outpatient prescriptions on file.  BP 137/82  Pulse 62  Temp(Src) 97.7 F (36.5 C) (Oral)  Resp 20  Ht 5\' 6"  (1.676 m)  Wt 186 lb  (84.369 kg)  BMI 30.02 kg/m2  SpO2 99%  Physical Exam  Constitutional: He is oriented to person, place, and time. He appears well-developed and well-nourished. No distress.  HENT:  Head: Normocephalic and atraumatic.  Mouth/Throat: Oropharynx is clear and moist. No oropharyngeal exudate.  Eyes: Conjunctivae and EOM are normal. Pupils are equal, round, and reactive to light.  Neck: Normal range of motion. Neck supple.  Cardiovascular: Normal rate, regular rhythm and normal heart sounds.   No murmur heard. Pulmonary/Chest: Effort normal and breath sounds normal. No respiratory distress.  Abdominal: Soft. There is no tenderness. There is no rebound and no guarding.  Musculoskeletal: Normal range of motion. He exhibits no edema and no tenderness.  Neurological: He is alert and oriented to person, place, and time. No cranial nerve deficit.  Skin: Skin is warm.    ED Course  Procedures (including critical care time)  Labs Reviewed  BASIC METABOLIC PANEL - Abnormal; Notable for the following:    Glucose, Bld 187 (*)    All other components within normal limits  COMPREHENSIVE METABOLIC PANEL - Abnormal; Notable for the following:    Glucose, Bld 193 (*)    GFR calc non Af Amer 89 (*)    All other components within normal limits  HEPARIN LEVEL (UNFRACTIONATED) - Abnormal; Notable for the following:    Heparin Unfractionated 0.25 (*)    All other components within normal limits  GLUCOSE, CAPILLARY - Abnormal; Notable for the following:    Glucose-Capillary 218 (*)  All other components within normal limits  CBC  DIFFERENTIAL  CARDIAC PANEL(CRET KIN+CKTOT+MB+TROPI)  PROTIME-INR  POCT I-STAT TROPONIN I  CARDIAC PANEL(CRET KIN+CKTOT+MB+TROPI)  MAGNESIUM  PRO B NATRIURETIC PEPTIDE  CBC  HEPARIN LEVEL (UNFRACTIONATED)  CARDIAC PANEL(CRET KIN+CKTOT+MB+TROPI)  CARDIAC PANEL(CRET KIN+CKTOT+MB+TROPI)  HEMOGLOBIN A1C  LIPID PANEL   Dg Chest Portable 1 View  04/07/2012  *RADIOLOGY  REPORT*  Clinical Data: Chest pain  PORTABLE CHEST - 1 VIEW  Comparison: 12/11/2004  Findings: Central bibasilar airspace opacities left greater than right have developed.  Upper lung zones clear.  Upper normal heart size.  No pneumothorax.  IMPRESSION: Bibasilar airspace disease left greater than right.  Original Report Authenticated By: Donavan Burnet, M.D.     1. Angina pectoris       MDM  Left-sided chest pain resolved with nitroglycerin concerning for angina. Pain-free at this time. The patient received aspirin. Will apply Nitropaste to chest. EKG shows anterior septal Q waves and J-point elevation in leads V3 and V4, no comparison  Troponin negative. History concerning for ACS.  D/w Dr. Katrinka Blazing of [facility] cardiology who will evaluate patient.    Date: 04/07/2012  Rate: 63  Rhythm: normal sinus rhythm  QRS Axis: left  Intervals: normal  ST/T Wave abnormalities: ST elevations anteriorly  Conduction Disutrbances:none  Narrative Interpretation: Anterior Q waves with J-point elevation in V3 and V4 concave upward ST segments  Old EKG Reviewed: none available    Date: 04/07/2012  Rate: 55  Rhythm: normal sinus rhythm and premature ventricular contractions (PVC)  QRS Axis: left  Intervals: normal  ST/T Wave abnormalities: ST elevations anteriorly  Conduction Disutrbances:none  Narrative Interpretation: J point elevation unchanged.  Old EKG Reviewed: changes noted  CRITICAL CARE Performed by: Glynn Octave   Total critical care time: 30  Critical care time was exclusive of separately billable procedures and treating other patients.  Critical care was necessary to treat or prevent imminent or life-threatening deterioration.  Critical care was time spent personally by me on the following activities: development of treatment plan with patient and/or surrogate as well as nursing, discussions with consultants, evaluation of patient's response to treatment, examination of patient,  obtaining history from patient or surrogate, ordering and performing treatments and interventions, ordering and review of laboratory studies, ordering and review of radiographic studies, pulse oximetry and re-evaluation of patient's condition.      Glynn Octave, MD 04/07/12 [phone]

## 2012-04-07 NOTE — ED Notes (Signed)
Lab at bedside

## 2012-04-07 NOTE — ED Notes (Signed)
Pt resting in stretcher.  Friend at bedside.

## 2012-04-07 NOTE — Progress Notes (Signed)
ANTICOAGULATION CONSULT NOTE - Initial Consult  Pharmacy Consult for [facility] Indication: chest pain/ACS  No Known Allergies  Patient Measurements: Height: 5\' 6"  (167.6 cm) Weight: 186 lb (84.369 kg) IBW/kg (Calculated) : 63.8  Heparin Dosing Weight: 72kg  Vital Signs: Temp: 97.8 F (36.6 C) (04/25 1458) Temp src: Oral (04/25 1458) BP: 126/67 mmHg (04/25 1534) Pulse Rate: 63  (04/25 1534)  Labs:  Basename 04/07/12 1504  HGB 14.4  HCT 41.9  PLT 207  APTT --  LABPROT 13.9  INR 1.05  HEPARINUNFRC --  CREATININE 0.82  CKTOTAL 193  CKMB 3.7  TROPONINI <0.30   Estimated Creatinine Clearance: 97.6 ml/min (by C-G formula based on Cr of 0.82).  Medical History: Past Medical History  Diagnosis Date  . Myocardial infarction     Medications:   (Not in a hospital admission)  Assessment: 61 y/o male patient admitted with chest pain requiring anticoagulation for r/o MI. CE neg x1, EKG shows anterior ST elevation.   Goal of Therapy:  Heparin level 0.3-0.7 units/ml   Plan:  Heparin 4000 unit IV bolus followed by infusion at 1000 units/hr. Check 6 hour heparin level with daily cbc and heparin level.  Verlene Mayer, PharmD, BCPS Pager [phone] 04/07/2012,4:01 PM

## 2012-04-07 NOTE — ED Notes (Signed)
Family at bedside. 

## 2012-04-07 NOTE — ED Notes (Signed)
Pt presents with onset of L sided chest pain while sweeping at work this morning.  Pt reports pain did not radiate and was constant until EMS gave him NTG.  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  Pt reports shortness of breath, denies any nausea or diaphoresis.  ASA 325mg  taken prior to EMS arrival.  PIV to [facility] is 18g angiocath.

## 2012-04-07 NOTE — Progress Notes (Signed)
MD on call for Dr. Katrinka Blazing said that he did want to start insulin for night time coverage. MD said that he would start insulin in the morning.   Juan Norris M

## 2012-04-07 NOTE — ED Notes (Signed)
Called pharmacy for heparin clarification.  PharmD will call back.

## 2012-04-07 NOTE — ED Notes (Signed)
Attempted to call report; RN unavailable to take report.

## 2012-04-07 NOTE — Progress Notes (Signed)
Pt's CBG was 218 MD on call was paged three times. Still waiting on response.   Juan Norris

## 2012-04-07 NOTE — ED Notes (Signed)
Provided pt with meal tray.  Pt eating in stretcher in NAD, respirations even and unlabored.

## 2012-04-07 NOTE — Progress Notes (Signed)
Pt's CBG was 218 MD on call was paged twice. Still waiting on response.   Juan Norris M

## 2012-04-07 NOTE — ED Notes (Signed)
Heparin infusion and bolus verified with Heriberto [place].  Pt verbalized understanding of heparin.

## 2012-04-07 NOTE — ED Notes (Signed)
Heparin ordered from pharmacy 

## 2012-04-08 ENCOUNTER — Observation Stay (HOSPITAL_COMMUNITY): Payer: BC Managed Care – PPO

## 2012-04-08 LAB — CBC
HCT: 41.2 % (ref 39.0–52.0)
Hemoglobin: 14.2 g/dL (ref 13.0–17.0)
RDW: 13.4 % (ref 11.5–15.5)
WBC: 8.5 10*3/uL (ref 4.0–10.5)

## 2012-04-08 LAB — HEPARIN LEVEL (UNFRACTIONATED): Heparin Unfractionated: 0.25 IU/mL — ABNORMAL LOW (ref 0.30–0.70)

## 2012-04-08 LAB — CARDIAC PANEL(CRET KIN+CKTOT+MB+TROPI)
Relative Index: 2.1 (ref 0.0–2.5)
Total CK: 137 U/L (ref 7–232)

## 2012-04-08 LAB — LIPID PANEL: LDL Cholesterol: 90 mg/dL (ref 0–99)

## 2012-04-08 MED ORDER — TECHNETIUM TC 99M TETROFOSMIN IV KIT
30.0000 | PACK | Freq: Once | INTRAVENOUS | Status: AC | PRN
  Administered 2012-04-08: 30 via INTRAVENOUS

## 2012-04-08 MED ORDER — SIMVASTATIN 40 MG PO TABS: 40.0000 mg | ORAL_TABLET | Freq: Every day | ORAL | Status: DC

## 2012-04-08 MED ORDER — TECHNETIUM TC 99M TETROFOSMIN IV KIT
10.0000 | PACK | Freq: Once | INTRAVENOUS | Status: AC | PRN
  Administered 2012-04-08: 10 via INTRAVENOUS

## 2012-04-08 MED ORDER — HEPARIN SODIUM (PORCINE) 5000 UNIT/ML IJ SOLN
5000.0000 [IU] | Freq: Three times a day (TID) | INTRAMUSCULAR | Status: DC
  Filled 2012-04-08 (×2): qty 1

## 2012-04-08 MED ORDER — LISINOPRIL 10 MG PO TABS: 10.0000 mg | ORAL_TABLET | Freq: Every day | ORAL | Status: DC

## 2012-04-08 MED ORDER — METOPROLOL TARTRATE 12.5 MG HALF TABLET: 12.5000 mg | ORAL_TABLET | Freq: Two times a day (BID) | ORAL | Status: DC

## 2012-04-08 MED ORDER — NITROGLYCERIN 0.4 MG SL SUBL: 0.4000 mg | SUBLINGUAL_TABLET | SUBLINGUAL | Status: DC | PRN

## 2012-04-08 MED ORDER — ACETAMINOPHEN 325 MG PO TABS: 650.0000 mg | ORAL_TABLET | ORAL | Status: DC | PRN

## 2012-04-08 MED ORDER — ASPIRIN 81 MG PO TBEC: 81.0000 mg | DELAYED_RELEASE_TABLET | Freq: Every day | ORAL | Status: AC

## 2012-04-08 NOTE — Progress Notes (Signed)
MD on call for Dr. Katrinka Blazing was paged for pt to get order for non-tele during the stress test ordered for today. Waiting on response.   Iman Reinertsen M

## 2012-04-08 NOTE — Discharge Summary (Signed)
Patient ID: Juan Norris MRN: [id number] DOB/AGE: June 10, 1951 61 y.o.  Admit date: 04/07/2012 Discharge date: 04/08/2012  Primary Discharge Diagnosis: Chest pain, cardiomyopathy Secondary Discharge Diagnosis: Tobacco use  Significant Diagnostic Studies:  Nuclear stress test showed mild ischemia in the distal anteroseptal region, ejection fraction calculated at 41%.  Echocardiogram demonstrated ejection fraction of 40-45% with mid to distal anteroseptal wall akinesis. 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 He was sleeping/vacuuming with radiation to the left arm. Mild shortness of breath. Transported to the ER via EMS. Nitroglycerin gradually reduced his discomfort. By the time Dr. Katrinka Blazing saw him in the emergency room, he was pain-free. He has a prior history in 1993 of a myocardial infarction but he does not recall being hospitalized.  EKG was suggestive of old anterior MI. Nuclear stress test demonstrated mild Ischemia in the anteroseptal/distal region. EF 41%. Echocardiogram also demonstrated decreased ejection fraction of 40-45% with mid to distal anteroseptal wall akinesis , hinge point noted.  I discussed his testing at length with both he and his boss from [employer]. His troponin has been negative x4. He has been chest pain-free since being on telemetry unit. He does complain of some lower left flank discomfort and this started after the stress test. He refused to take Tylenol previously. Currently he is pain-free.  I explained to him that it did appear that he possibly had an old infarction in that his ejection fraction was reduced. My suggestion to him was to proceed with cardiac catheterization to  further define coronary anatomy, in to confirm or deny possible etiology for decreased ejection fraction as ischemic.  I carefully explained the options of him staying here over the weekend and having the cardiac catheterization performed early next week versus going home and coming back as an outpatient for further cardiac workup/cardiac catheterization. Because he did not have an acute event i.e. troponins were negative, no high-risk ischemic changes noted on stress test, I feel comfortable allowing him to go home and to return for cardiac catheterization. He prefers this as well. I have relayed this to Dr. Katrinka Blazing who does not object with treatment  plan.  He clearly understands that if he has any further worsening discomfort/chest pain or any other worrisome symptoms to seek medical attention.  Given his decreased ejection fraction, I started him on low-dose lisinopril 10 mg once a day and we'll continue metoprolol 12.5 mg twice a day. Consider use of carvedilol in the future. His heart rate currently is in the upper 50s low 60s. This may limit further beta blocker titration.  Tobacco cessation.   Discharge Exam: Blood pressure 150/77, pulse 63, temperature 97.6 F (36.4 C), temperature source Oral, resp. rate 20, height 5\' 6"  (1.676 m), weight 82.5 kg (181 lb 14.1 oz), SpO2 99.00%.    General: Alert and oriented x3 no acute distress Cardiovascular: Regular rate and rhythm with no murmurs rubs or gallops Lungs: Clear to auscultation bilaterally no wheezes Abdomen: Soft nontender no bruits normal bowel sounds Musculoskeletal: No flank tenderness/no costovertebral angle tenderness Extremities: Warm dry, no edema Labs:   Lab Results  Component Value Date   WBC 8.5 04/08/2012   HGB 14.2 04/08/2012   HCT 41.2 04/08/2012   MCV 86.4 04/08/2012   PLT 206 04/08/2012  Lab 04/07/12 2114  NA 139  K 3.8  CL 104  CO2 26  BUN 12  CREATININE 0.93  CALCIUM 9.1  PROT 7.0  BILITOT 0.4  ALKPHOS 92    ALT 13  AST 23  GLUCOSE 193*   Lab Results  Component Value Date   CKTOTAL 130 04/08/2012   CKMB 2.7 04/08/2012   TROPONINI <0.30 04/08/2012    Lab Results  Component Value Date   CHOL 142 04/08/2012   Lab Results  Component Value Date   HDL 26* 04/08/2012   Lab Results  Component Value Date   LDLCALC 90 04/08/2012   Lab Results  Component Value Date   TRIG 131 04/08/2012   Lab Results  Component Value Date   CHOLHDL 5.5 04/08/2012   No results found for this basename: LDLDIRECT      FOLLOW UP PLANS AND APPOINTMENTS Discharge Orders    Future Orders Please Complete By Expires   Diet - low sodium heart healthy      Increase activity slowly      Discharge instructions      Comments:   If chest pain worsens or becomes more worrisome, seek medical attention.     Medication List  As of 04/08/2012  4:40 PM   TAKE these medications         acetaminophen 325 MG tablet   Commonly known as: TYLENOL   Take 2 tablets (650 mg total) by mouth every 4 (four) hours as needed.      aspirin 81 MG EC tablet   Take 1 tablet (81 mg total) by mouth daily.      lisinopril 10 MG tablet   Commonly known as: PRINIVIL,ZESTRIL   Take 1 tablet (10 mg total) by mouth daily.      metoprolol tartrate 12.5 mg Tabs   Commonly known as: LOPRESSOR   Take 0.5 tablets (12.5 mg total) by mouth 2 (two) times daily.      nitroGLYCERIN 0.4 MG SL tablet   Commonly known as: NITROSTAT   Place 1 tablet (0.4 mg total) under the tongue every 5 (five) minutes x 3 doses as needed for chest pain.      simvastatin 40 MG tablet   Commonly known as: ZOCOR   Take 1 tablet (40 mg total) by mouth daily at 6 PM.            Bonita Quin from our office [facility]) will be contacting him in regards to cardiac catheterization timing next week.  I do not want him to resume heavy activity until cardiac catheterization is performed.  BRING ALL MEDICATIONS WITH YOU TO FOLLOW UP APPOINTMENTS  Time spent with  patient to include physician time: Greater than 35 minutes spent with discharge, instructions, education, then reconciliation, review of stress test, echocardiogram.  SignedDonato Schultz 04/08/2012, 4:40 PM

## 2012-04-08 NOTE — Progress Notes (Signed)
Patient Name: Juan Norris Date of Encounter: 04/08/2012    SUBJECTIVE: Atypical chest pain last evening that would occur with change of position in the bed. No recurrence of the burning discomfort that he had at work.  TELEMETRY:  Sinus bradycardia.: Filed Vitals:   04/07/12 1945 04/07/12 2022 04/07/12 2129 04/08/12 0536  BP: 140/58 140/58 137/82 114/65  Pulse: 59 67 62 60  Temp:  97.5 F (36.4 C) 97.7 F (36.5 C) 97.7 F (36.5 C)  TempSrc:  Axillary Oral Oral  Resp:  20 20 16   Height:      Weight:    82.5 kg (181 lb 14.1 oz)  SpO2: 97% 95% 99% 94%   No intake or output data in the 24 hours ending 04/08/12 0744  LABS: Basic Metabolic Panel:  Basename 04/07/12 2114 04/07/12 1504  NA 139 138  K 3.8 3.5  CL 104 103  CO2 26 23  GLUCOSE 193* 187*  BUN 12 12  CREATININE 0.93 0.82  CALCIUM 9.1 9.3  MG 1.9 --  PHOS -- --   CBC:  Basename 04/07/12 1504  WBC 8.0  NEUTROABS 5.1  HGB 14.4  HCT 41.9  MCV 86.9  PLT 207   Cardiac Enzymes:  Basename 04/08/12 0300 04/07/12 2114 04/07/12 1504  CKTOTAL 137 159 193  CKMB 2.9 3.2 3.7  CKMBINDEX -- -- --  TROPONINI <0.30 <0.30 <0.30   BNP: <100  Hemoglobin A1C:  Basename 04/07/12 2114  HGBA1C 6.0*   Fasting Lipid Panel:  Basename 04/08/12 0253  CHOL 142  HDL 26*  LDLCALC 90  TRIG 914  CHOLHDL 5.5  LDLDIRECT --    Radiology/Studies:  Chronic bibasilar lung changes noted since 2005  Physical Exam: Blood pressure 114/65, pulse 60, temperature 97.7 F (36.5 C), temperature source Oral, resp. rate 16, height 5\' 6"  (1.676 m), weight 82.5 kg (181 lb 14.1 oz), SpO2 94.00%. Weight change:    S 4 gallop.  ASSESSMENT:  1. Chest pain without evidence of MI  2. Abnormal ECG suggestive of LVH or hypertrophic CM  3. Borderline DM  4. Tobacco use with chronic bibasilar findings on CXR   Plan:  1. Stress cardiolite 2. Echo to r/o LVH 3. Diabetes teaching  Signed, [name] 04/08/2012, 7:44 AM

## 2012-04-08 NOTE — Progress Notes (Signed)
ANTICOAGULATION CONSULT NOTE - Follow Up Consult  Pharmacy Consult for heparin Indication: chest pain/ACS  Labs:  [facility] 04/07/12 2246 04/07/12 2114 04/07/12 1504  HGB -- -- 14.4  HCT -- -- 41.9  PLT -- -- 207  APTT -- -- --  LABPROT -- -- 13.9  INR -- -- 1.05  HEPARINUNFRC 0.25* -- --  CREATININE -- 0.93 0.82  CKTOTAL -- 159 193  CKMB -- 3.2 3.7  TROPONINI -- <0.30 <0.30    Assessment: 61yo male slightly subtherapeutic on heparin with initial dosing for CP.  Goal of Therapy:  Heparin level 0.3-0.7 units/ml   Plan:  Will increase heparin gtt by 2 units/kg/hr to 1150 units/hr and check level with next CE.  Colleen Can PharmD BCPS 04/08/2012,12:02 AM

## 2012-04-08 NOTE — Progress Notes (Signed)
   Pt. Discharged 04/08/2012  4:50 PM Discharge instructions reviewed with patient/family. Patient/family verbalized understanding. All Rx's given. Questions answered as needed. Pt. Discharged to home with family/self.  Juan Norris

## 2012-04-08 NOTE — Progress Notes (Signed)
*  PRELIMINARY RESULTS* Echocardiogram 2D Echocardiogram has been performed.  Katheren Puller 04/08/2012, 3:16 PM

## 2012-04-08 NOTE — Progress Notes (Signed)
Inpatient Diabetes Program Recommendations  AACE/ADA: New Consensus Statement on Inpatient Glycemic Control (2009)  Target Ranges:  Prepandial:   less than 140 mg/dL      Peak postprandial:   less than 180 mg/dL (1-2 hours)      Critically ill patients:  140 - 180 mg/dL   Reason for Visit: Referral received.  Note patient has no previous history of diabetes.  His A1C is 6.0% indicating at risk for diabetes (prediabetes).  Briefly spoke to patient and gave him handout regarding "FAQ's regarding pre-diabetes" from the ADA.  Briefly discussed signs and symptoms of hyperglycemia and reiterated the importance of following up with his PCP.  Patient verbalized understanding.

## 2012-04-08 NOTE — Progress Notes (Signed)
UR Completed. Simmons, Paiton Fosco F [phone]  

## 2012-04-08 NOTE — Consult Note (Signed)
Pt is a 1/2 ppd smoker who is in contemplation stage. Discussed risk factors of continued smoking on his health status and his heart. Reviewed pharmacotherapies available to pt for quitting. He verbalizes understanding and will think about it. Referred to 1-800 quit now for f/u and support. Discussed oral fixation substitutes, second hand smoke and in home smoking policy. Reviewed and gave pt Written education/contact information.

## 2012-04-08 NOTE — Progress Notes (Signed)
ANTICOAGULATION CONSULT NOTE - Follow Up Consult  Pharmacy Consult:  Heparin Indication:  ACS / CP  No Known Allergies  Patient Measurements: Height: 5\' 6"  (167.6 cm) Weight: 181 lb 14.1 oz (82.5 kg) IBW/kg (Calculated) : 63.8  Heparin Dosing Weight: 72 kg  Vital Signs: Temp: 97.7 F (36.5 C) (04/26 0536) Temp src: Oral (04/26 0536) BP: 114/65 mmHg (04/26 0536) Pulse Rate: 60  (04/26 0536)  Labs:  Juan Norris 04/08/12 0855 04/08/12 0852 04/08/12 0300 04/07/12 2246 04/07/12 2114 04/07/12 1504  HGB 14.2 -- -- -- -- 14.4  HCT 41.2 -- -- -- -- 41.9  PLT 206 -- -- -- -- 207  APTT -- -- -- -- -- --  LABPROT -- -- -- -- -- 13.9  INR -- -- -- -- -- 1.05  HEPARINUNFRC 0.25* -- -- 0.25* -- --  CREATININE -- -- -- -- 0.93 0.82  CKTOTAL -- 130 137 -- 159 --  CKMB -- 2.7 2.9 -- 3.2 --  TROPONINI -- <0.30 <0.30 -- <0.30 --   Estimated Creatinine Clearance: 85.2 ml/min (by C-G formula based on Cr of 0.93).     Assessment: 60 YOM on heparin gtt for CP at admission.  No evidence for MI per MD and patient in stress test currently.  Heparin level unchanged despite rate increase.  Heparin was infusing without complications per RN.  Cardiac enzymes negative.   Goal of Therapy:  HL 0.3 - 0.7 units/mL    Plan:  - Increase heparin gtt to 1300 units/hr - Check 6 hr HL if still on heparin post stress test - Daily HL / CBC    Baruch Lewers D. Laney Potash, PharmD, BCPS Pager:  [phone] 04/08/2012, 10:49 AM

## 2012-04-11 ENCOUNTER — Other Ambulatory Visit: Payer: Self-pay | Admitting: Interventional Cardiology

## 2012-04-12 ENCOUNTER — Other Ambulatory Visit: Payer: Self-pay | Admitting: Interventional Cardiology

## 2012-04-14 ENCOUNTER — Encounter (HOSPITAL_BASED_OUTPATIENT_CLINIC_OR_DEPARTMENT_OTHER)
Admission: RE | Disposition: A | Discharge status: Home / Self Care | Payer: Self-pay | Source: Ambulatory Visit | Attending: Interventional Cardiology

## 2012-04-14 ENCOUNTER — Inpatient Hospital Stay (HOSPITAL_BASED_OUTPATIENT_CLINIC_OR_DEPARTMENT_OTHER)
Admission: RE | Admit: 2012-04-14 | Discharge: 2012-04-14 | Disposition: A | Discharge status: Home / Self Care | Payer: BC Managed Care – PPO | Source: Ambulatory Visit | Attending: Interventional Cardiology | Admitting: Interventional Cardiology

## 2012-04-14 DIAGNOSIS — R079 Chest pain, unspecified: Secondary | ICD-10-CM | POA: Insufficient documentation

## 2012-04-14 SURGERY — JV LEFT HEART CATHETERIZATION WITH CORONARY ANGIOGRAM
Anesthesia: Moderate Sedation

## 2012-04-14 MED ORDER — ACETAMINOPHEN 325 MG PO TABS: 650.0000 mg | ORAL_TABLET | ORAL | Status: DC | PRN

## 2012-04-14 MED ORDER — SODIUM CHLORIDE 0.9 % IJ SOLN: 3.0000 mL | INTRAMUSCULAR | Status: DC | PRN

## 2012-04-14 MED ORDER — SODIUM CHLORIDE 0.9 % IV SOLN: INTRAVENOUS | Status: DC

## 2012-04-14 MED ORDER — DIAZEPAM 5 MG PO TABS
5.0000 mg | ORAL_TABLET | ORAL | Status: AC
  Administered 2012-04-14: 5 mg via ORAL

## 2012-04-14 MED ORDER — ASPIRIN 81 MG PO CHEW
324.0000 mg | CHEWABLE_TABLET | ORAL | Status: AC
  Administered 2012-04-14: 243 mg via ORAL

## 2012-04-14 MED ORDER — FLUMAZENIL 1 MG/10ML IV SOLN
0.2000 mg | Freq: Once | INTRAVENOUS | Status: AC
  Administered 2012-04-14: 0.2 mg via INTRAVENOUS

## 2012-04-14 MED ORDER — SODIUM CHLORIDE 0.9 % IV SOLN: 250.0000 mL | INTRAVENOUS | Status: DC | PRN

## 2012-04-14 MED ORDER — SODIUM CHLORIDE 0.9 % IJ SOLN: 3.0000 mL | Freq: Two times a day (BID) | INTRAMUSCULAR | Status: DC

## 2012-04-14 MED ORDER — ONDANSETRON HCL 4 MG/2ML IJ SOLN: 4.0000 mg | Freq: Four times a day (QID) | INTRAMUSCULAR | Status: DC | PRN

## 2012-04-14 NOTE — OR Nursing (Signed)
Dr Katrinka Blazing at bedside to discuss results and treatment plan with pt

## 2012-04-14 NOTE — CV Procedure (Signed)
     Diagnostic Cardiac Catheterization Report  Juan Norris  61 y.o.  male [birth year]  Procedure Date: 04/14/2012  Referring Physician:  ?? Primary Cardiologist:: Darci Needle, III, MD   PROCEDURE:  Left heart catheterization with selective coronary angiography, left ventriculogram.  INDICATIONS:  Recurrent episodes of burning left chest discomfort, recent hospital admission for chest pain (MI ruled out), and abnormal stress nuclear study demonstrating mid to distal anterior wall ischemia with decreased anterior wall motion. See impression of nuclear study:  IMPRESSION:  Small area of reversible ischemia in the apical septal region.  Calculated ejection fraction 41%.  Original Report Authenticated By: Andreas Newport, M.D.  The risks, benefits, and details of the procedure were explained to the patient.  The patient verbalized understanding and wanted to proceed.  Informed written consent was obtained.  PROCEDURE TECHNIQUE:  After Xylocaine anesthesia a 4 Fr sheath was placed in the right femoral artery with a single anterior needle wall stick.   Coronary angiography was done using a 4 Fr A2 #4 left and right Judkins catheters.  Left ventriculography was done using a 4 [place] A2 multipurpose catheter. 200 mcg of intracoronary nitroglycerin was administered during left coronary angiography.   CONTRAST:  Total of 100 cc.  COMPLICATIONS:  Protracted hypotension requiring reversal of sedation with Romazicon.  HEMODYNAMICS:  Aortic pressure was 90/43 mm mercury; LV pressure was 97/7 mmHg; LVEDP 9 mm mercury.  There was no gradient between the left ventricle and aorta.    ANGIOGRAPHIC DATA:   The left main coronary artery is short but widely patent.  The left anterior descending artery is dominant 3 diagonal branches arise from the LAD. Systolic compression of the apical LAD is noted. Irregularities are noted in the mid LAD. Proximal LAD narrowing up to 50% completely resolved after  administration of 200 mcg of intracoronary nitroglycerin..  The left circumflex artery is free lateral wall branches arise from the circumflex in the form of obtuse marginals. Irregularities are noted in the mid circumflex. No significant obstruction is seen..  The right coronary artery is codominant with proximal tortuosity but no significant obstruction.Marland Kitchen  LEFT VENTRICULOGRAM:  Left ventricular angiogram was done in the 30 RAO projection and revealed abnormal left ventricular wall motion with mid to distal anteroapical hypokinesis and estimated ejection fraction of 50 %. \  IMPRESSIONS:  1. No significant obstructive coronary disease is noted. Proximal LAD catheter induced spasm was noted but relieved with intracoronary nitroglycerin.  2. Distal anterior wall hypokinesis with ejection fraction of 50%.  3. The patient does have a wall motion abnormality. Etiology is uncertain but could represent prior injury from transient LAD a diagonal occlusion. Possible etiologies would include remote episode of intense coronary spasm or plaque rupture with thrombosis (and spontaneous recanalization).   RECOMMENDATION:  1. Risk factor modification.  2. Consider GI workup to exclude the possibility that chest discomfort is related to esophageal reflux.Marland Kitchen

## 2012-04-14 NOTE — OR Nursing (Signed)
Discharge instructions reviewed and signed, pt stated understanding, ambulated in hall without difficulty, site level 0, transported to daughter's car via wheelchair 

## 2012-04-14 NOTE — H&P (Signed)
The patient was admitted to the hospital last week after developing chest burning while working at [employer]. He is a custodian at the screw. He ruled out for myocardial infarction, had a stress nuclear performed which revealed anteroapical ischemia, and is here to have diagnostic catheterization done to define anatomy. Since discharge from the hospital he has had one recurrence of chest burning that responded to sublingual nitroglycerin. The procedure, and risks including stroke, death, MI, bleeding, limb ischemia, kidney injury, allergy, among others were discussed in detail with the patient and accepted. There is no change in his physical exam since last week.

## 2012-04-14 NOTE — OR Nursing (Signed)
Tegaderm dressing applied, site level 0, bedrest begins at 1200 

## 2012-05-23 ENCOUNTER — Encounter: Payer: Self-pay | Admitting: *Deleted

## 2012-05-23 ENCOUNTER — Encounter: Payer: BC Managed Care – PPO | Attending: Interventional Cardiology | Admitting: *Deleted

## 2012-05-23 DIAGNOSIS — E119 Type 2 diabetes mellitus without complications: Secondary | ICD-10-CM | POA: Insufficient documentation

## 2012-05-23 DIAGNOSIS — Z713 Dietary counseling and surveillance: Secondary | ICD-10-CM | POA: Insufficient documentation

## 2012-05-23 NOTE — Patient Instructions (Signed)
Plan: Aim for 2 carb choices (30 grams) at each of 3 meals a day Consider having a banana for mid day carb choices at work instead of skipping that meal Continue with current activity level both at work and walking on your days off Consider reading food labels for total carb of foods Notify MD if notice symptoms of high BG such as frequent urination especially at night or increased thirst

## 2012-05-23 NOTE — Progress Notes (Signed)
  Medical Nutrition Therapy:  Appt start time: 0800 end time:  0900.  Assessment:  Primary concerns today: patient here for diabetes education. Lab work indicates he probably has pre-diabetes. 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 He has strong family history of diabetes and wants to avoid any complications. He states he was over weight while in the [employer] for 20 years and works very hard to keep his weight down.   MEDICATIONS: see list   DIETARY INTAKE:  Usual eating pattern includes 2 meals and no snacks per day.  Everyday foods include small amounts of lean meat and starch.  Avoided foods include tired of heavy meats, chicken, salty foods, vegetables, sweets .    24-hr recall:  B ( AM): left overs from supper like tuna and a biscuit, diet Mountain Dew Snk ( AM): none  L ( PM): none Snk ( PM): none or occasionally a fresh fruit D ( PM): occasionally fish, typically toast, or potatoes or rice,  Snk ( PM): none Beverages: diet Mountain Dew, coffee black, occasionally water Has been eating 1-2 meals a day for last 25 years  Usual physical activity: works as Arboriculturist at [employer], walks at least 5-10 miles a day at work, walks instead of driving  Estimated energy needs: 1500 calories 170 g carbohydrates 112 g protein 42 g fat  Progress Towards Goal(s):  In progress.   Nutritional Diagnosis:  NB-1.1 Food and nutrition-related knowledge deficit As related to diabetes.  As evidenced by A1c of 6.0% & FBG of 126 mg/dl on 06/03/3085.    Intervention:  Nutrition counseling and diabetes education provided. He does not eat enough to provide adequate nutrition. Discussed basic physiology of diabetes, symptoms of high and low BG, Carb Counting and Reading food labels Plan: Aim for 2 carb choices (30 grams) at each of 3 meals a day Consider having a banana for mid day carb choices at work instead of skipping that meal Continue with current  activity level both at work and walking on your days off Consider reading food labels for total carb of foods Notify MD if notice symptoms of high BG such as frequent urination especially at night or increased thirst  Handouts given during visit include: Living Well with Diabetes Carb Counting and Food Label handouts Meal Plan Card  Monitoring/Evaluation:  Dietary intake, exercise, reading food labels, and body weight prn.

## 2012-07-04 ENCOUNTER — Ambulatory Visit: Payer: Self-pay

## 2012-07-04 ENCOUNTER — Other Ambulatory Visit: Payer: Self-pay | Admitting: Occupational Medicine

## 2012-07-04 DIAGNOSIS — M79671 Pain in right foot: Secondary | ICD-10-CM

## 2012-07-04 IMAGING — CR DG FOOT COMPLETE 3+V*R*
4 series · 4 of 4 positions shown · non-contrast
Comparison: None.

CLINICAL DATA: Blunt force trauma, pain at first and second toes

RIGHT FOOT COMPLETE - 3+ VIEW

[view not recorded (1 of 4)]
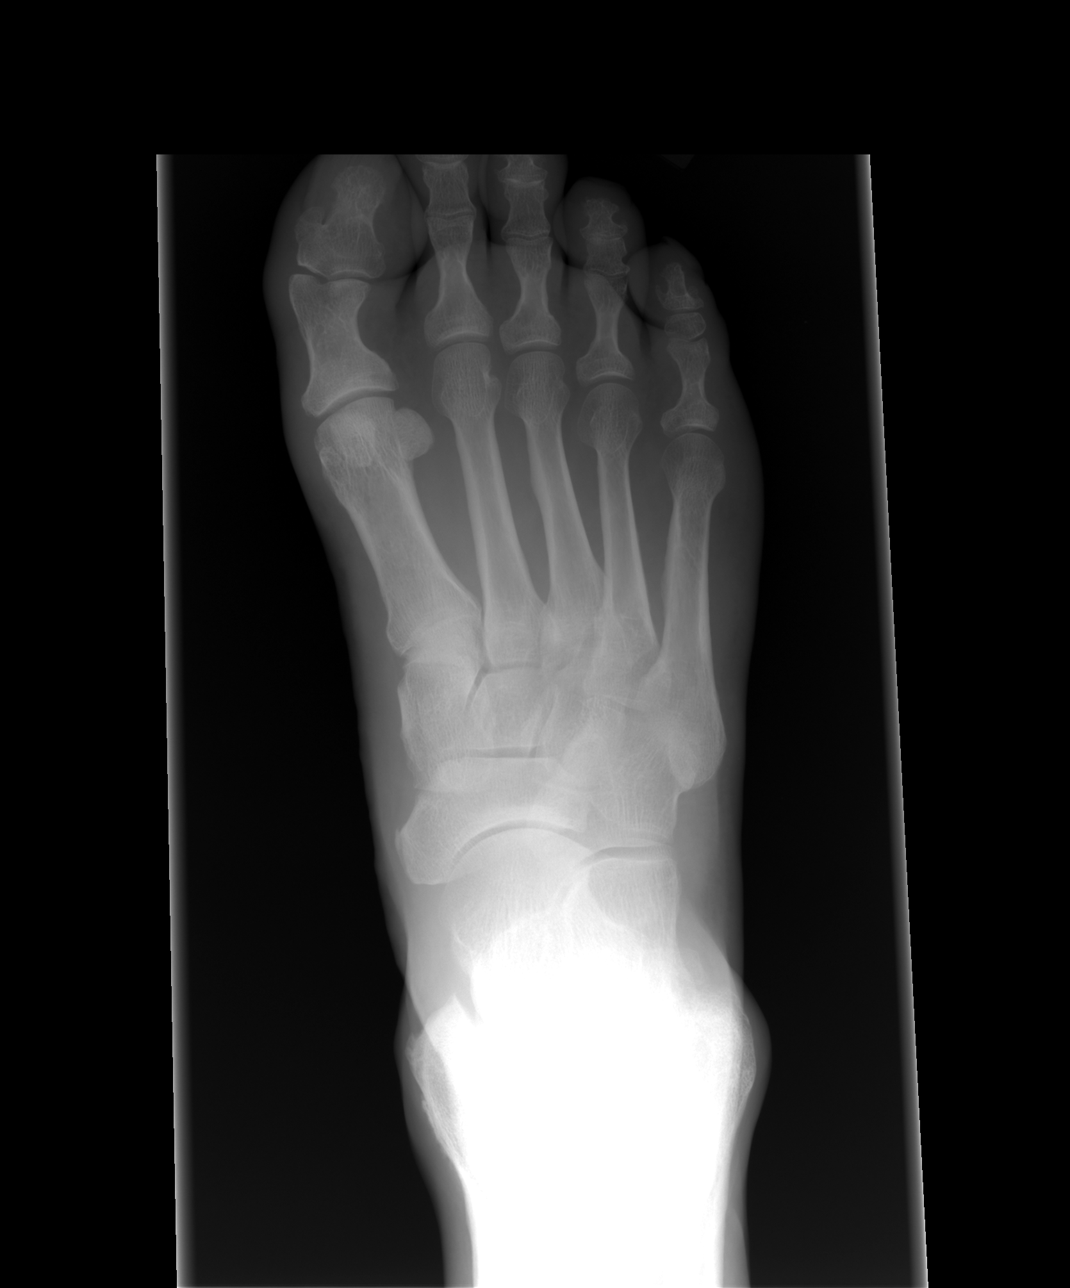

[view not recorded (2 of 4)]
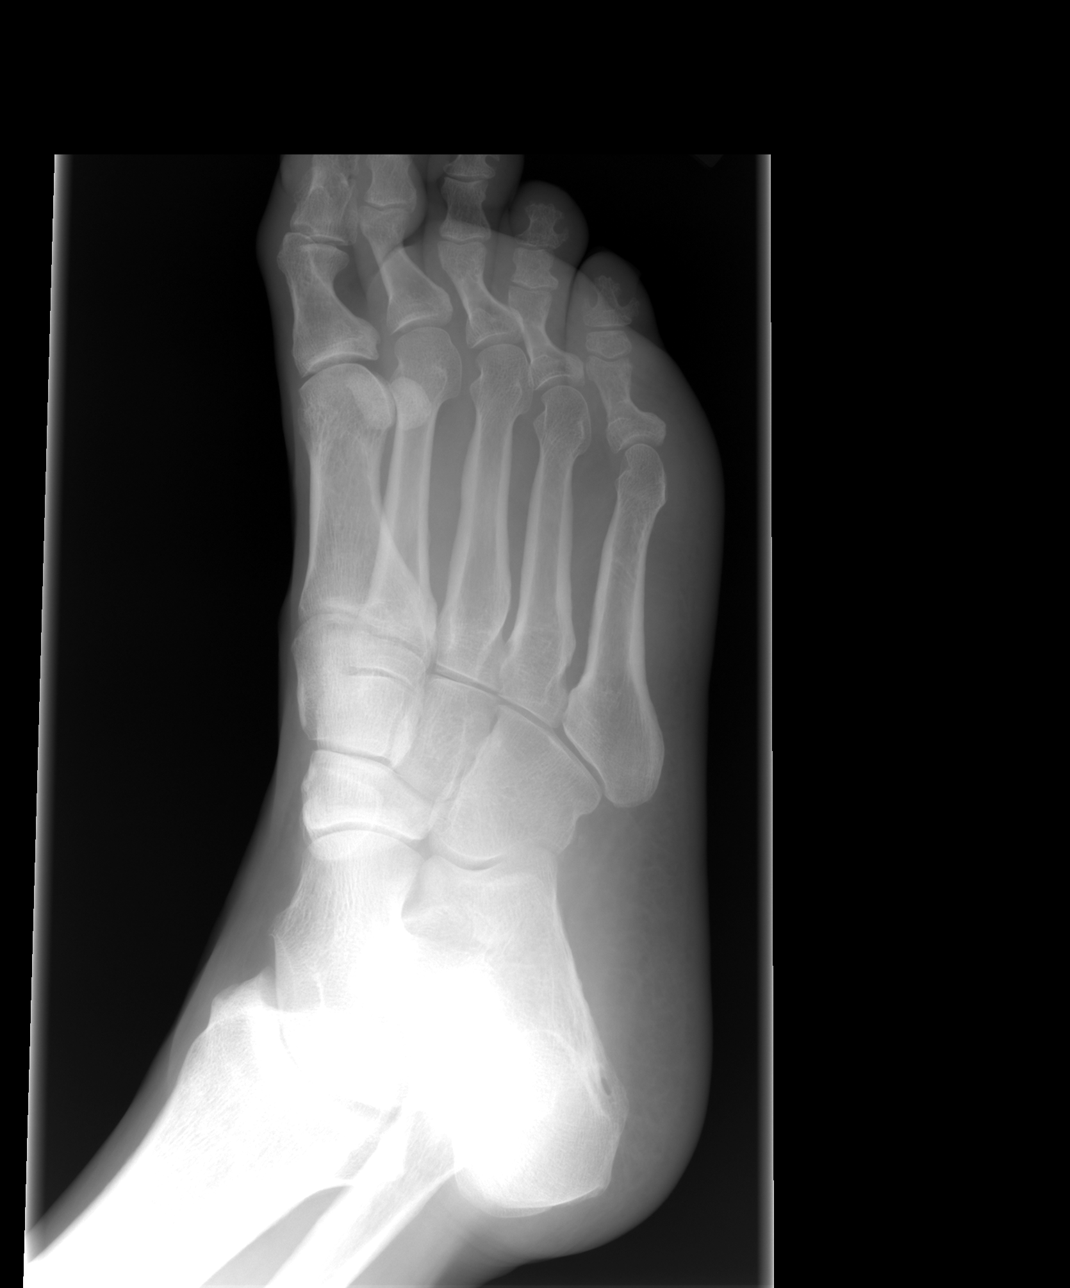

[view not recorded (3 of 4)]
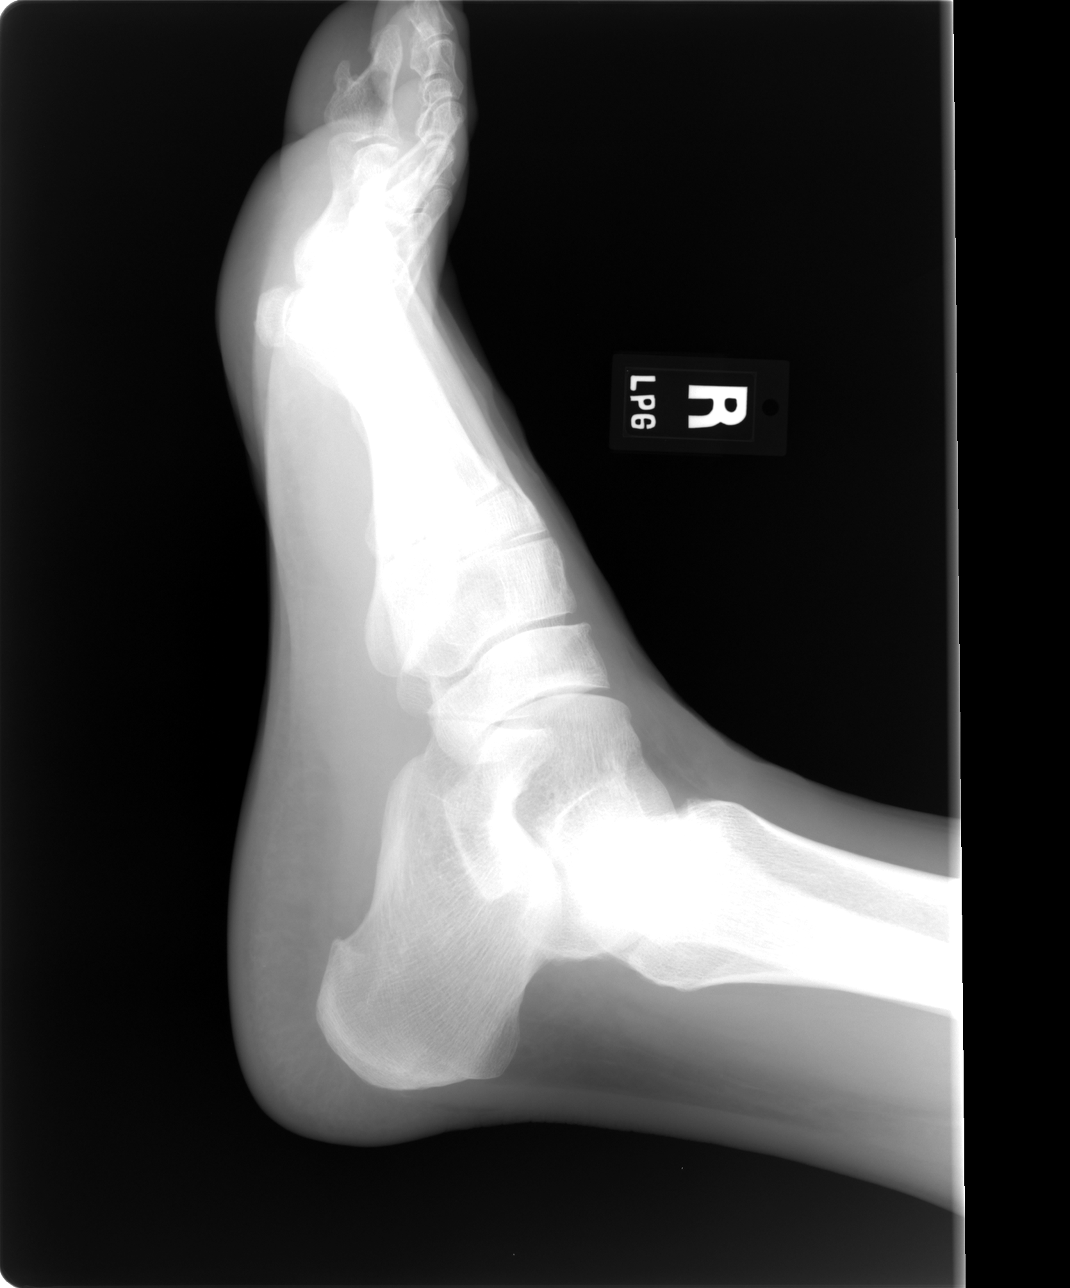

[view not recorded (4 of 4)]
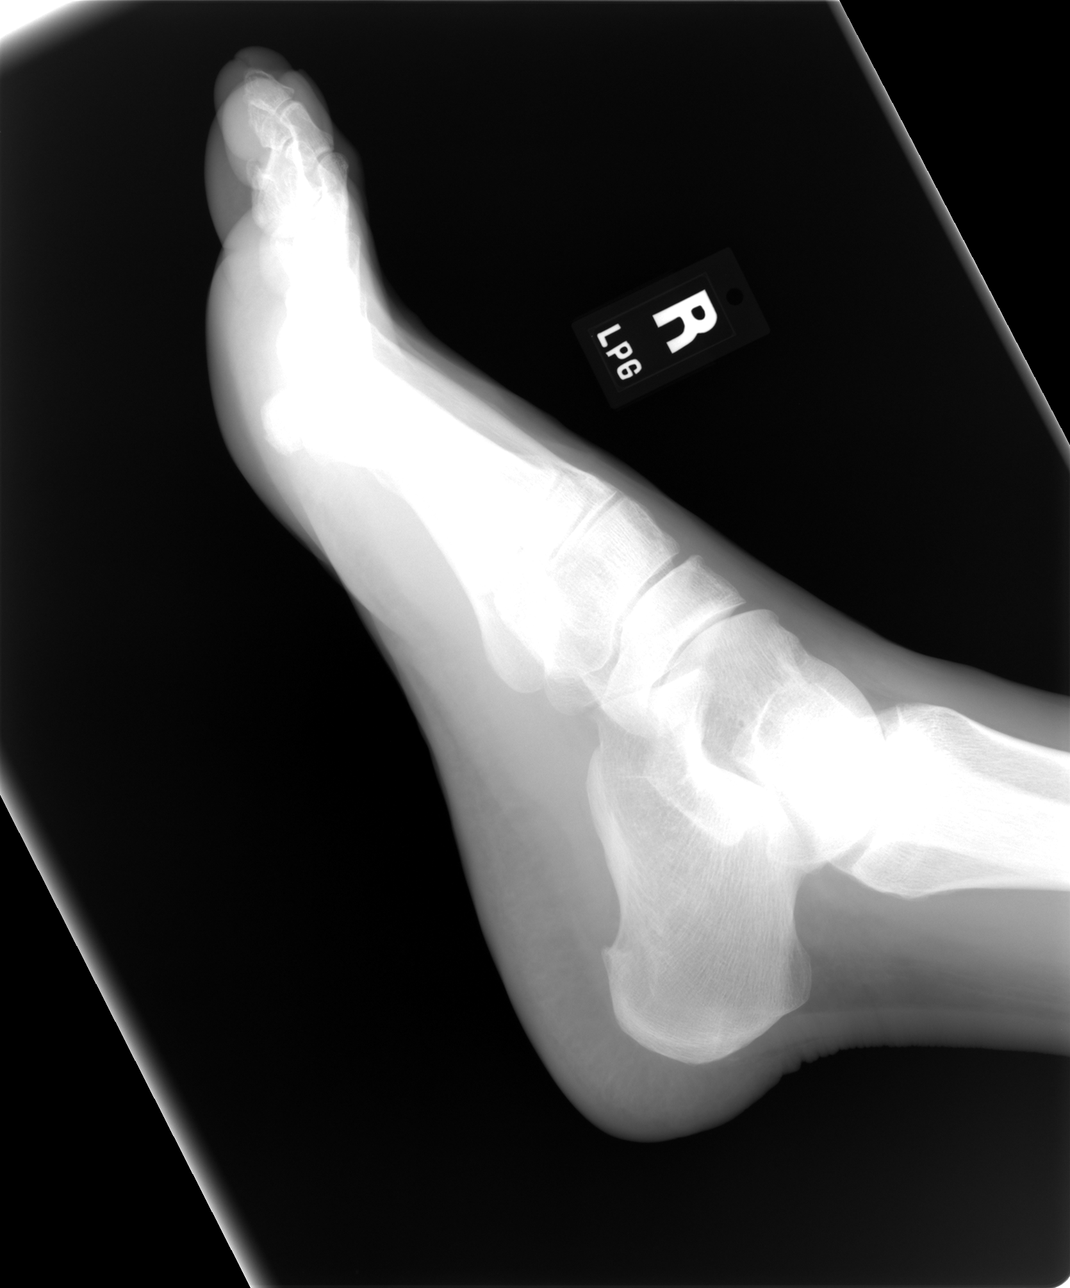

[4 of 4 positions shown; findings below may reference images not displayed]

FINDINGS: Osseous mineralization grossly normal.
Joint spaces preserved.
No acute fracture, dislocation or bone destruction.
IMPRESSION: No acute osseous abnormalities.

## 2012-11-17 ENCOUNTER — Emergency Department (HOSPITAL_COMMUNITY)
Admission: EM | Admit: 2012-11-17 | Discharge: 2012-11-17 | Disposition: A | Discharge status: Home / Self Care | Payer: BC Managed Care – PPO | Attending: Emergency Medicine | Admitting: Emergency Medicine

## 2012-11-17 ENCOUNTER — Emergency Department (HOSPITAL_COMMUNITY): Payer: BC Managed Care – PPO

## 2012-11-17 ENCOUNTER — Encounter (HOSPITAL_COMMUNITY): Payer: Self-pay | Admitting: *Deleted

## 2012-11-17 DIAGNOSIS — Z79899 Other long term (current) drug therapy: Secondary | ICD-10-CM | POA: Insufficient documentation

## 2012-11-17 DIAGNOSIS — I252 Old myocardial infarction: Secondary | ICD-10-CM | POA: Insufficient documentation

## 2012-11-17 DIAGNOSIS — I10 Essential (primary) hypertension: Secondary | ICD-10-CM | POA: Insufficient documentation

## 2012-11-17 DIAGNOSIS — F17200 Nicotine dependence, unspecified, uncomplicated: Secondary | ICD-10-CM | POA: Insufficient documentation

## 2012-11-17 DIAGNOSIS — R079 Chest pain, unspecified: Secondary | ICD-10-CM

## 2012-11-17 HISTORY — DX: Essential (primary) hypertension: I10

## 2012-11-17 HISTORY — DX: Angina pectoris, unspecified: I20.9

## 2012-11-17 LAB — CBC
HCT: 41 % (ref 39.0–52.0)
Hemoglobin: 14.1 g/dL (ref 13.0–17.0)
MCH: 29.5 pg (ref 26.0–34.0)
MCV: 85.8 fL (ref 78.0–100.0)
Platelets: 209 10*3/uL (ref 150–400)
RBC: 4.78 MIL/uL (ref 4.22–5.81)
WBC: 8.3 10*3/uL (ref 4.0–10.5)

## 2012-11-17 LAB — COMPREHENSIVE METABOLIC PANEL
BUN: 15 mg/dL (ref 6–23)
CO2: 26 mEq/L (ref 19–32)
Calcium: 9.2 mg/dL (ref 8.4–10.5)
Chloride: 106 mEq/L (ref 96–112)
Creatinine, Ser: 1.06 mg/dL (ref 0.50–1.35)
GFR calc Af Amer: 86 mL/min — ABNORMAL LOW (ref >90)
GFR calc non Af Amer: 74 mL/min — ABNORMAL LOW (ref >90)
Glucose, Bld: 123 mg/dL — ABNORMAL HIGH (ref 70–99)
Total Bilirubin: 0.3 mg/dL (ref 0.3–1.2)

## 2012-11-17 LAB — POCT I-STAT TROPONIN I: Troponin i, poc: 0 ng/mL (ref 0.00–0.08)

## 2012-11-17 LAB — URINALYSIS, ROUTINE W REFLEX MICROSCOPIC
Bilirubin Urine: NEGATIVE
Hgb urine dipstick: NEGATIVE
Ketones, ur: NEGATIVE mg/dL
Nitrite: NEGATIVE
Urobilinogen, UA: 0.2 mg/dL (ref 0.0–1.0)

## 2012-11-17 LAB — URINE MICROSCOPIC-ADD ON

## 2012-11-17 LAB — PROTIME-INR
INR: 1.18 (ref 0.00–1.49)
Prothrombin Time: 14.8 seconds (ref 11.6–15.2)

## 2012-11-17 LAB — APTT: aPTT: 32 seconds (ref 24–37)

## 2012-11-17 IMAGING — CR DG CHEST 1V PORT
1 series · 1 of 1 positions shown · non-contrast
Comparison: Portable chest x-ray of [DATE]

CLINICAL DATA: Chest pain with weakness, smoking history

PORTABLE CHEST - 1 VIEW

[AP]
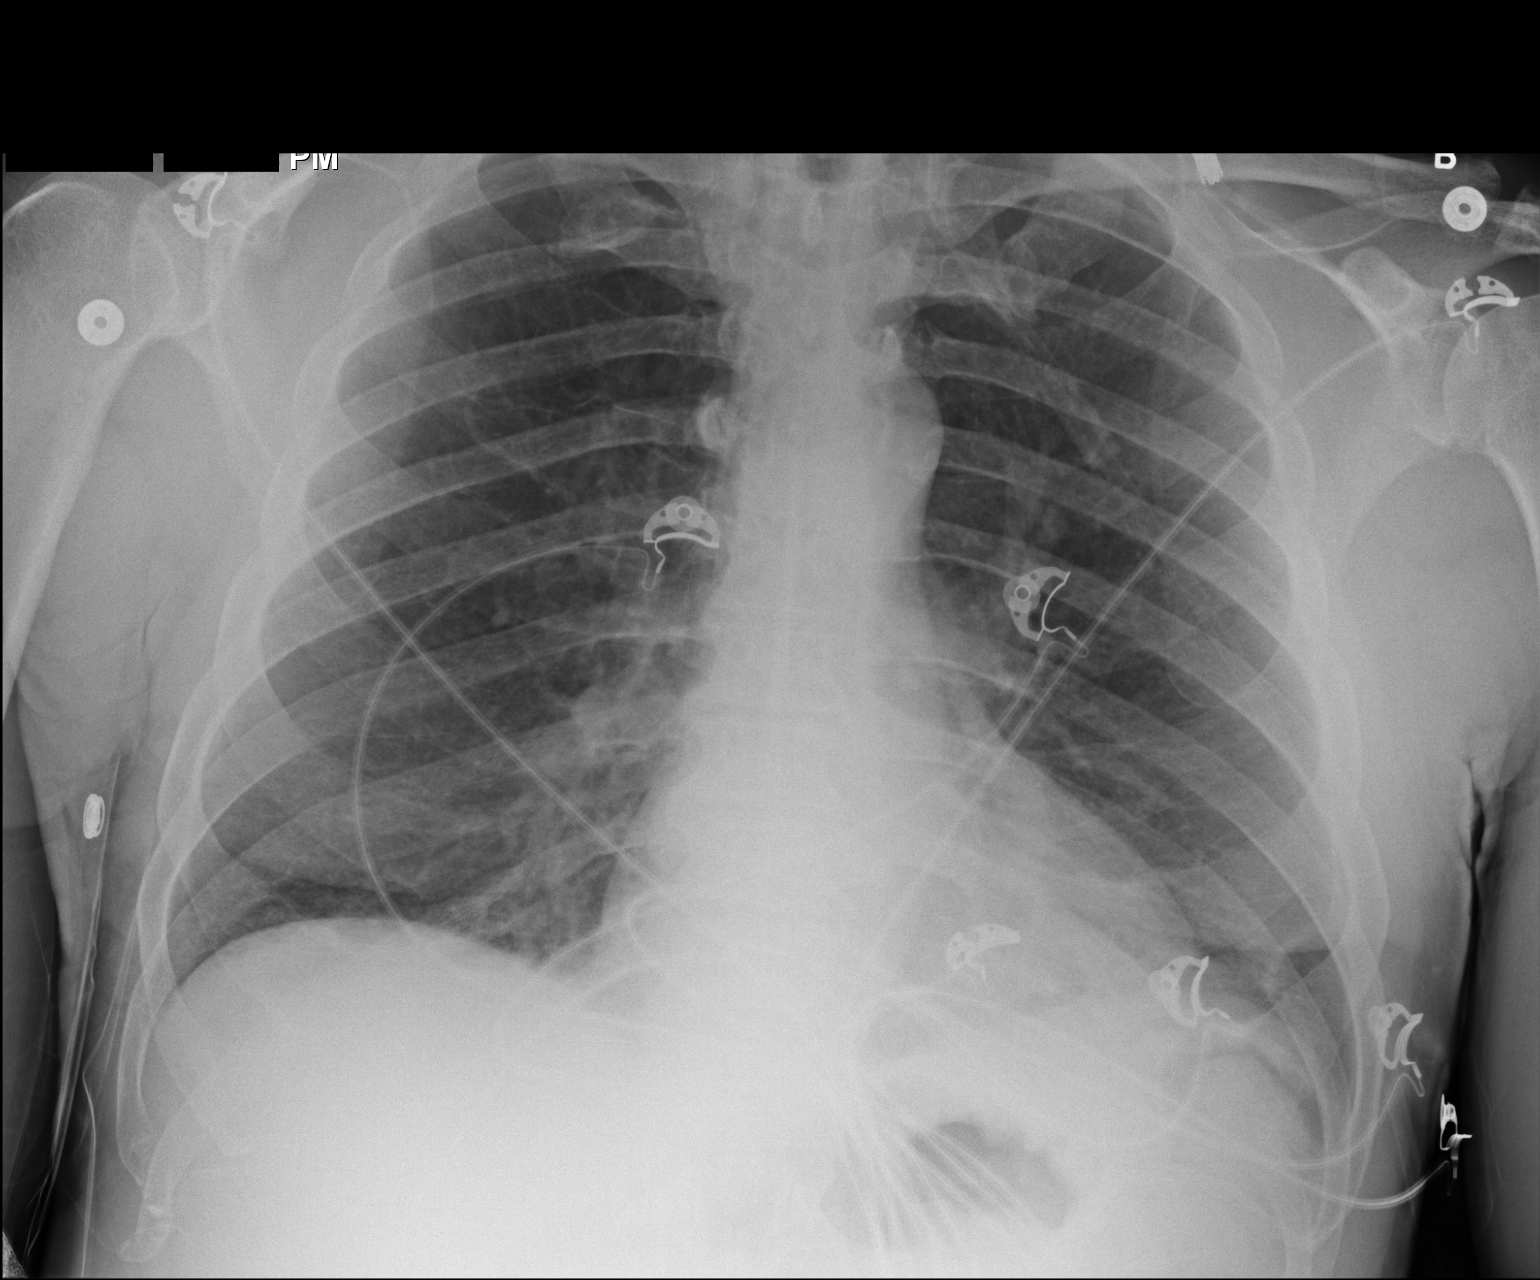

[1 of 1 positions shown; findings below may reference images not displayed]

FINDINGS: Minimal linear scarring is noted at the left lung base.
The right lung is clear.  The heart is within upper limits of
normal.  No bony abnormality is seen.
IMPRESSION: Stable linear scarring at the left lung base.  No active lung
disease.

## 2012-11-17 MED ORDER — NITROGLYCERIN 0.4 MG SL SUBL: 0.4000 mg | SUBLINGUAL_TABLET | SUBLINGUAL | Status: DC | PRN

## 2012-11-17 MED ORDER — SODIUM CHLORIDE 0.9 % IV SOLN
1000.0000 mL | INTRAVENOUS | Status: DC
  Administered 2012-11-17: 1000 mL via INTRAVENOUS

## 2012-11-17 MED ORDER — ASPIRIN 81 MG PO CHEW: 324.0000 mg | CHEWABLE_TABLET | Freq: Once | ORAL | Status: DC

## 2012-11-17 NOTE — ED Provider Notes (Signed)
Patient moved to CDU awaiting delta troponin.  Pt presented today with chest pain.  Dr Katrinka Blazing with [facility] was consulted and recommended a repeat set of cardiac markers.  Plan: delta troponin is negative, will discharge home with outpatient follow-up with Dr Katrinka Blazing this week.    BP 140/83  Pulse 51  Temp 97.6 F (36.4 C) (Oral)  Resp 18  SpO2 97%   On exam: hemodynamically stable, NAD, heart w/ RRR, lungs CTAB, Chest & abd non-tender, no peripheral edema or calf tenderness.  Pt is without further complaint of chest pain at this time.   8:16 PM Pt very agitated stating that he does not care what Dr Katrinka Blazing says he will not stay here in the hospital.  He states the Dr. Katrinka Blazing is not his cardiologist and he will never see that physician again because before he thought there was "something wrong with his heart Dr. Katrinka Blazing said there wasn't."  Second delta troponin negative.  I discussed the patient with Dr. Estell Harpin and he is amenable to discharging the patient.  I spoke at length with patient about the need to followup with the cardiologist I have given him Dr. Michaelle Copas number as well as followup information for [facility] cardiology.  1. Medications: usual home medications 2. Treatment: rest, drink plenty of fluids,  3. Follow Up: Please followup with your primary doctor for discussion of your diagnoses and further evaluation after today's visit; if you do not have a primary care doctor use the resource guide provided to find one; follow up with Dr. Katrinka Blazing or [facility] cardiology as listed in the discharge instructions  Dierdre Forth, PA-C 11/17/12 2037

## 2012-11-17 NOTE — ED Provider Notes (Signed)
History     CSN: [id number]  Arrival date & time 11/17/12  1425   First MD Initiated Contact with Patient 11/17/12 1505      Chief Complaint  Patient presents with  . Chest Pain    (Consider location/radiation/quality/duration/timing/severity/associated sxs/prior treatment) HPI Comments: A 61 year old male, who presents emergency department with chief complaint of chest pain around lunch today. Patient is a custodian at a local school, he states that while he was taking out the trash he experienced an episode of chest pain. He took one nitroglycerin, which relieved his symptoms. He proceeded with his work, taking out more trash for which the chest pain returned stronger than his experiences in the past. This was again relieved with nitroglycerin, but as it was stronger than in the past he came to the ED via EMS. Patient denies headache, shortness of breath, nausea, vomiting, diarrhea, constipation. He currently states that he's not having any chest pain. The pain is in his substernal region and radiated slightly to the left shoulder. He has experienced this before, and was admitted in May for chest pain rule by Dr. Verdis Prime, of [facility].  The history is provided by the patient. No language interpreter was used.    Past Medical History  Diagnosis Date  . Myocardial infarction   . Hypertension   . Anginal pain     Past Surgical History  Procedure Date  . Knee surgery   . Cardiac catheterization     No family history on file.  History  Substance Use Topics  . Smoking status: Current Every Day Smoker -- 0.5 packs/day for 40 years  . Smokeless tobacco: Never Used  . Alcohol Use: No      Review of Systems  All other systems reviewed and are negative.    Allergies  Review of patient's allergies indicates no known allergies.  Home Medications   Current Outpatient Rx  Name  Route  Sig  Dispense  Refill  . ASPIRIN 81 MG PO TBEC   Oral   Take 1 tablet (81 mg total) by  mouth daily.   30 tablet   12   . LISINOPRIL 10 MG PO TABS   Oral   Take 1 tablet (10 mg total) by mouth daily.   30 tablet   12   . METOPROLOL TARTRATE 12.5 MG HALF TABLET   Oral   Take 0.5 tablets (12.5 mg total) by mouth 2 (two) times daily.   30 tablet   12   . ADULT MULTIVITAMIN W/MINERALS CH   Oral   Take 2 tablets by mouth daily.         Marland Kitchen NITROGLYCERIN 0.4 MG SL SUBL   Sublingual   Place 1 tablet (0.4 mg total) under the tongue every 5 (five) minutes x 3 doses as needed for chest pain.   30 tablet   12   . SIMVASTATIN 40 MG PO TABS   Oral   Take 40 mg by mouth every morning.         . ACETAMINOPHEN 325 MG PO TABS   Oral   Take 650 mg by mouth every 4 (four) hours as needed. As needed for pain.           BP 126/55  Pulse 52  Temp 97.7 F (36.5 C) (Oral)  Resp 16  SpO2 97%  Physical Exam  Nursing note and vitals reviewed. Constitutional: He is oriented to person, place, and time. He appears well-developed and well-nourished.  HENT:  Head: Normocephalic and atraumatic.  Right Ear: External ear normal.  Left Ear: External ear normal.  Nose: Nose normal.  Mouth/Throat: Oropharynx is clear and moist. No oropharyngeal exudate.  Eyes: Conjunctivae normal and EOM are normal. Pupils are equal, round, and reactive to light. Right eye exhibits no discharge. Left eye exhibits no discharge. No scleral icterus.  Neck: Normal range of motion. Neck supple. No JVD present.  Cardiovascular: Normal rate, regular rhythm, normal heart sounds and intact distal pulses.  Exam reveals no gallop and no friction rub.   No murmur heard. Pulmonary/Chest: Effort normal and breath sounds normal. No respiratory distress. He has no wheezes. He has no rales. He exhibits no tenderness.  Abdominal: Soft. Bowel sounds are normal. He exhibits no distension and no mass. There is no tenderness. There is no rebound and no guarding.  Musculoskeletal: Normal range of motion. He exhibits  no edema and no tenderness.  Neurological: He is alert and oriented to person, place, and time. He has normal reflexes.       CN 3-12 intact  Skin: Skin is warm and dry.  Psychiatric: He has a normal mood and affect. His behavior is normal. Judgment and thought content normal.    ED Course  Procedures (including critical care time)   Labs Reviewed  CBC  COMPREHENSIVE METABOLIC PANEL  PROTIME-INR  APTT  URINALYSIS, ROUTINE W REFLEX MICROSCOPIC   Dg Chest Portable 1 View  11/17/2012  *RADIOLOGY REPORT*  Clinical Data: Chest pain with weakness, smoking history  PORTABLE CHEST - 1 VIEW  Comparison: Portable chest x-ray of 04/07/2012  Findings: Minimal linear scarring is noted at the left lung base. The right lung is clear.  The heart is within upper limits of normal.  No bony abnormality is seen.  IMPRESSION:  Stable linear scarring at the left lung base.  No active lung disease.   Original Report Authenticated By: Dwyane Dee, M.D.      1. Chest pain       MDM  This is a 61 year old male with chest pain.  I have discussed this patient with Dr. Estell Harpin, who tells me to consult Dr. Verdis Prime, with [facility].  Dr. Katrinka Blazing tells me that if the patient rules out, he may be discharged and may follow-up with Dr. Katrinka Blazing in his clinic.  4:38 PM I am going to move the patient to the CDU to have serial trops.  I have discussed this patient with Phillis Knack, PA-C, who will continue care upon arrival in the CDU.  The patient will be discharged if troponins remain negative.  He is to follow-up with Dr. Katrinka Blazing ASAP.  4:45 PM Patient DOES NOT want to follow-up with Dr. Katrinka Blazing or anyone else at [facility].  He would like a referral to a different Cardiologist.       Roxy Horseman, PA-C 11/17/12 1730

## 2012-11-17 NOTE — ED Notes (Signed)
Report given to [facility] in CDU

## 2012-11-17 NOTE — ED Notes (Signed)
Patient is resting comfortably. 

## 2012-11-17 NOTE — ED Provider Notes (Signed)
Medical screening examination/treatment/procedure(s) were performed by non-physician practitioner and as supervising physician I was immediately available for consultation/collaboration.   Benny Lennert, MD 11/17/12 [phone]

## 2012-11-17 NOTE — ED Provider Notes (Signed)
Medical screening examination/treatment/procedure(s) were performed by non-physician practitioner and as supervising physician I was immediately available for consultation/collaboration.   Deandria Klute L Eura Radabaugh, MD 11/17/12 2324 

## 2012-11-17 NOTE — ED Notes (Signed)
Per EMS- pt started having chest pain this morning. Pt reported sharp pain and he took one nitro. Pt continued to have chest pain so he took another. Pt took another with change in pain to heaviness and burning. Pt had just arrived at work when pain started. Pain increased with palpation but not movement . Pt also reports a lot of lifting while at work today. 324mg  of Asprin and 3rd nitro en route. Denies pain upon arrival to ED with EMS.

## 2012-11-17 NOTE — ED Provider Notes (Signed)
MSE was initiated and I personally evaluated the patient and placed orders (if any) at  3:00 PM on November 17, 2012.  The patient appears stable so that the remainder of the MSE may be completed by another provider.    Carleene Cooper III, MD 11/17/12 1500

## 2012-11-17 NOTE — ED Notes (Signed)
Patient is alert and orientedx4.  Discharge instructions were explained to the patient and he had no questions.

## 2012-11-17 NOTE — ED Notes (Signed)
Care transferred and report given to Vincenza Hews, RN/Leslie, RN

## 2012-11-17 NOTE — ED Notes (Signed)
Pt reports intermittent burning in left chest at this time.

## 2012-11-17 NOTE — ED Notes (Signed)
Report received from S. Anderson and patient moved from A7 to CDU1.

## 2012-12-08 ENCOUNTER — Emergency Department (HOSPITAL_COMMUNITY): Payer: BC Managed Care – PPO

## 2012-12-08 ENCOUNTER — Encounter (HOSPITAL_COMMUNITY): Payer: Self-pay | Admitting: *Deleted

## 2012-12-08 ENCOUNTER — Emergency Department (HOSPITAL_COMMUNITY)
Admission: EM | Admit: 2012-12-08 | Discharge: 2012-12-09 | Disposition: A | Discharge status: Home / Self Care | Payer: BC Managed Care – PPO | Attending: Emergency Medicine | Admitting: Emergency Medicine

## 2012-12-08 DIAGNOSIS — Z7982 Long term (current) use of aspirin: Secondary | ICD-10-CM | POA: Insufficient documentation

## 2012-12-08 DIAGNOSIS — Z79899 Other long term (current) drug therapy: Secondary | ICD-10-CM | POA: Insufficient documentation

## 2012-12-08 DIAGNOSIS — F430 Acute stress reaction: Secondary | ICD-10-CM | POA: Insufficient documentation

## 2012-12-08 DIAGNOSIS — F17200 Nicotine dependence, unspecified, uncomplicated: Secondary | ICD-10-CM | POA: Insufficient documentation

## 2012-12-08 DIAGNOSIS — I10 Essential (primary) hypertension: Secondary | ICD-10-CM | POA: Insufficient documentation

## 2012-12-08 DIAGNOSIS — I209 Angina pectoris, unspecified: Secondary | ICD-10-CM | POA: Insufficient documentation

## 2012-12-08 DIAGNOSIS — R079 Chest pain, unspecified: Secondary | ICD-10-CM

## 2012-12-08 DIAGNOSIS — I252 Old myocardial infarction: Secondary | ICD-10-CM | POA: Insufficient documentation

## 2012-12-08 LAB — CBC WITH DIFFERENTIAL/PLATELET
Basophils Relative: 0 % (ref 0–1)
Eosinophils Absolute: 0.2 10*3/uL (ref 0.0–0.7)
Eosinophils Relative: 2 % (ref 0–5)
Hemoglobin: 14.9 g/dL (ref 13.0–17.0)
Lymphs Abs: 2.6 10*3/uL (ref 0.7–4.0)
MCH: 29.2 pg (ref 26.0–34.0)
MCHC: 34.5 g/dL (ref 30.0–36.0)
MCV: 84.7 fL (ref 78.0–100.0)
Monocytes Absolute: 0.6 10*3/uL (ref 0.1–1.0)
Monocytes Relative: 8 % (ref 3–12)
Neutrophils Relative %: 58 % (ref 43–77)
RBC: 5.1 MIL/uL (ref 4.22–5.81)

## 2012-12-08 LAB — COMPREHENSIVE METABOLIC PANEL
Albumin: 3.8 g/dL (ref 3.5–5.2)
Alkaline Phosphatase: 94 U/L (ref 39–117)
BUN: 10 mg/dL (ref 6–23)
Calcium: 9.1 mg/dL (ref 8.4–10.5)
Creatinine, Ser: 0.98 mg/dL (ref 0.50–1.35)
GFR calc Af Amer: >90 mL/min (ref >90)
Glucose, Bld: 100 mg/dL — ABNORMAL HIGH (ref 70–99)
Potassium: 3.9 mEq/L (ref 3.5–5.1)
Total Protein: 7.3 g/dL (ref 6.0–8.3)

## 2012-12-08 LAB — TROPONIN I: Troponin I: <0.3 ng/mL (ref <0.30)

## 2012-12-08 IMAGING — CR DG CHEST 1V PORT
1 series · 1 of 1 positions shown · non-contrast
Comparison: Chest radiograph performed [DATE]

CLINICAL DATA: Chest pain.

PORTABLE CHEST - 1 VIEW

[AP]
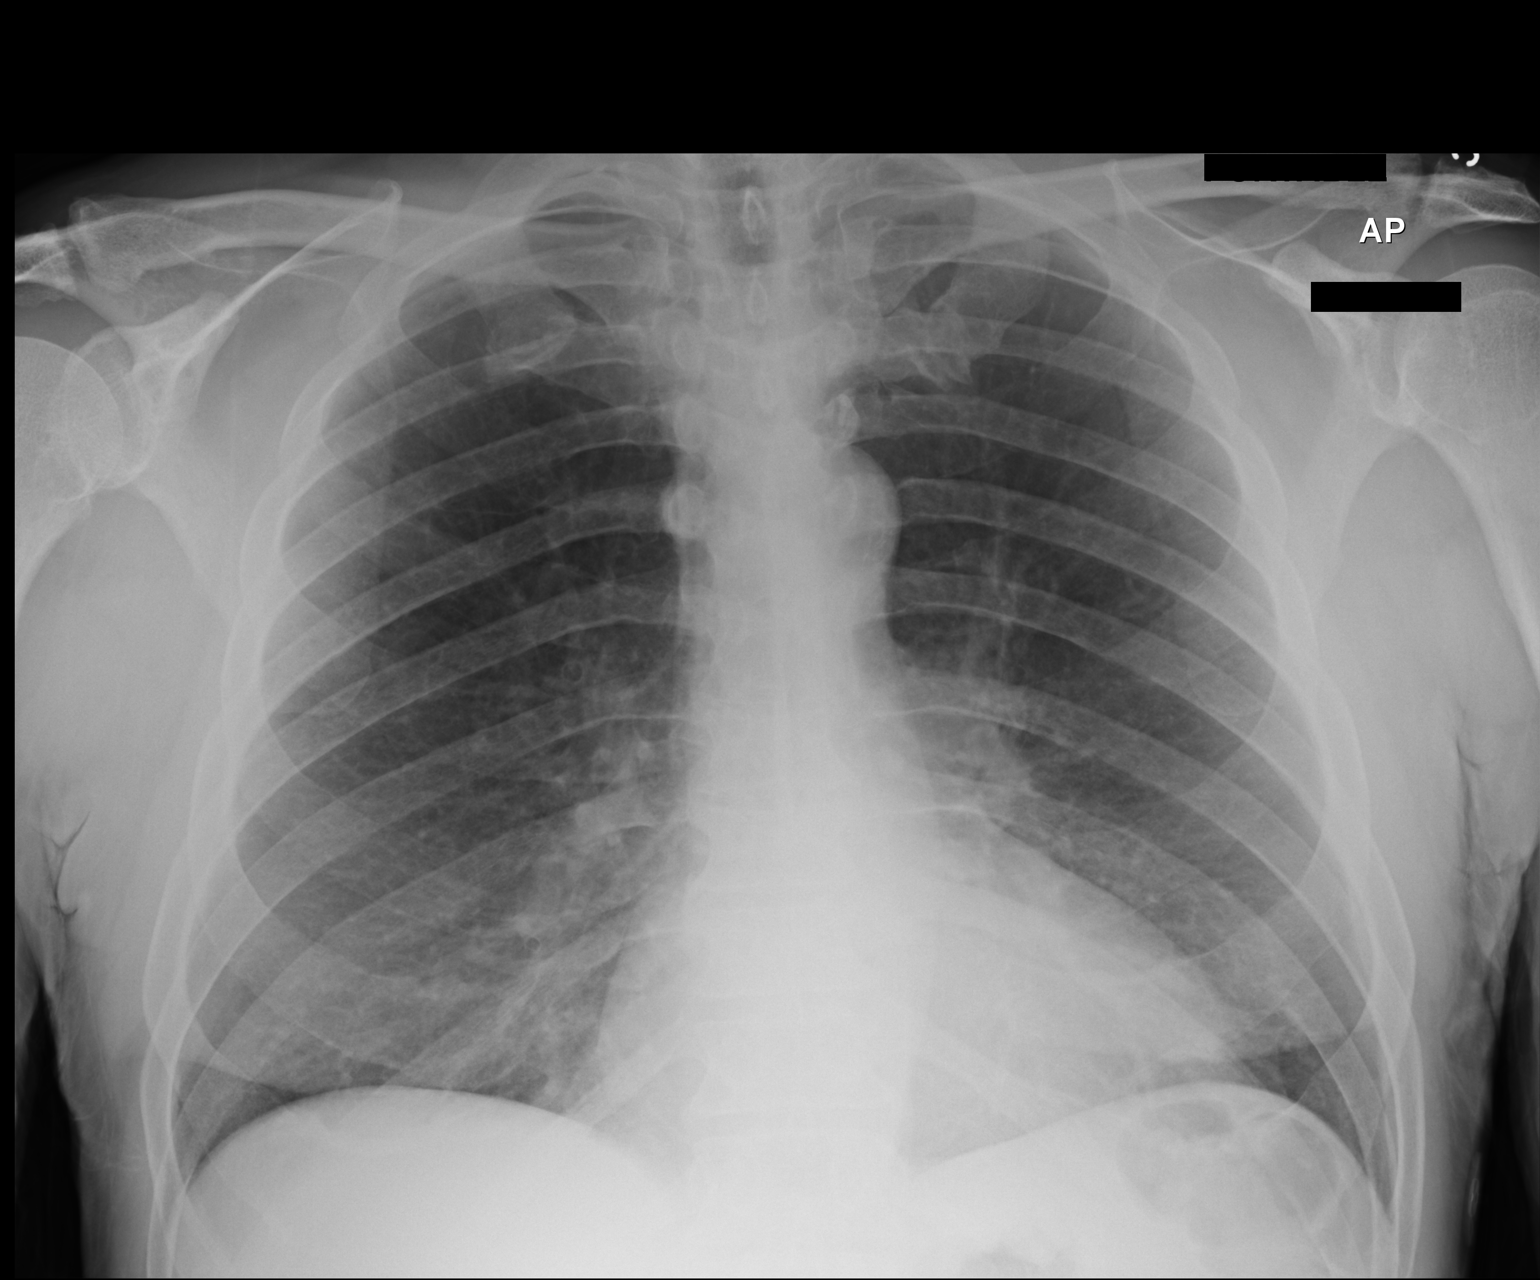

[1 of 1 positions shown; findings below may reference images not displayed]

FINDINGS: The lungs are well-aerated.  Minimal scarring is again
noted at the left lung base.  There is no evidence of focal
opacification, pleural effusion or pneumothorax.

The cardiomediastinal silhouette is within normal limits.  No acute
osseous abnormalities are seen.  Mild degenerative change is noted
at the acromioclavicular joints bilaterally.
IMPRESSION: No acute cardiopulmonary process seen.

## 2012-12-08 MED ORDER — LORAZEPAM 1 MG PO TABS
1.0000 mg | ORAL_TABLET | Freq: Once | ORAL | Status: AC
  Administered 2012-12-09: 1 mg via ORAL
  Filled 2012-12-08: qty 1

## 2012-12-08 NOTE — ED Notes (Signed)
The pt is c/o lt upper chest pain with lt arm radiation all day.  His brother died this am.  He has taken on sl nitro that stopped his lt arm pain

## 2012-12-08 NOTE — ED Provider Notes (Addendum)
History     CSN: [id number]  Arrival date & time 12/08/12  1941   First MD Initiated Contact with Patient 12/08/12 2257      Chief Complaint  Patient presents with  . Chest Pain    (Consider location/radiation/quality/duration/timing/severity/associated sxs/prior treatment) Patient is a 61 y.o. male presenting with chest pain. The history is provided by the patient.  Chest Pain The chest pain began 6 - 12 hours ago. The chest pain is resolved. The pain is associated with stress. At its most intense, the pain is at 6/10. The pain is currently at 1/10. The severity of the pain is mild. The quality of the pain is described as aching. The pain does not radiate. Pertinent negatives for primary symptoms include no fever and no shortness of breath. He tried nitroglycerin and aspirin for the symptoms.  His past medical history is significant for MI.     Past Medical History  Diagnosis Date  . Myocardial infarction   . Hypertension   . Anginal pain     Past Surgical History  Procedure Date  . Knee surgery   . Cardiac catheterization     No family history on file.  History  Substance Use Topics  . Smoking status: Current Every Day Smoker -- 0.5 packs/day for 40 years  . Smokeless tobacco: Never Used  . Alcohol Use: No      Review of Systems  Constitutional: Negative for fever.  Respiratory: Negative for shortness of breath.   Cardiovascular: Positive for chest pain.  All other systems reviewed and are negative.    Allergies  Review of patient's allergies indicates no known allergies.  Home Medications   Current Outpatient Rx  Name  Route  Sig  Dispense  Refill  . ASPIRIN 81 MG PO TBEC   Oral   Take 1 tablet (81 mg total) by mouth daily.   30 tablet   12   . LISINOPRIL 10 MG PO TABS   Oral   Take 1 tablet (10 mg total) by mouth daily.   30 tablet   12   . METOPROLOL TARTRATE 12.5 MG HALF TABLET   Oral   Take 0.5 tablets (12.5 mg total) by mouth 2 (two)  times daily.   30 tablet   12   . ADULT MULTIVITAMIN W/MINERALS CH   Oral   Take 2 tablets by mouth daily.         Marland Kitchen NITROGLYCERIN 0.4 MG SL SUBL   Sublingual   Place 1 tablet (0.4 mg total) under the tongue every 5 (five) minutes x 3 doses as needed for chest pain.   30 tablet   12   . SIMVASTATIN 40 MG PO TABS   Oral   Take 40 mg by mouth every morning.           BP 138/77  Pulse 69  Temp 97.5 F (36.4 C) (Oral)  Resp 18  SpO2 96%  Physical Exam  Constitutional: He is oriented to person, place, and time. He appears well-developed and well-nourished.  HENT:  Head: Normocephalic and atraumatic.  Eyes: Conjunctivae normal are normal. Pupils are equal, round, and reactive to light.  Neck: Normal range of motion. Neck supple.  Cardiovascular: Normal rate, regular rhythm, normal heart sounds and intact distal pulses.   Pulmonary/Chest: Effort normal and breath sounds normal.  Abdominal: Soft. Bowel sounds are normal.  Neurological: He is alert and oriented to person, place, and time.  Skin: Skin is warm and dry.  Psychiatric: He has a normal mood and affect. His behavior is normal. Judgment and thought content normal.    ED Course  Procedures (including critical care time)  Labs Reviewed  COMPREHENSIVE METABOLIC PANEL - Abnormal; Notable for the following:    Glucose, Bld 100 (*)     GFR calc non Af Amer 87 (*)     All other components within normal limits  CBC WITH DIFFERENTIAL  TROPONIN I   No results found.   No diagnosis found.  Date: 12/08/2012  Rate: 67  Rhythm: normal sinus rhythm  QRS Axis: left  Intervals: normal  ST/T Wave abnormalities: nonspecific ST changes  Conduction Disutrbances:left bundle branch block  Narrative Interpretation:   Old EKG Reviewed: unchanged    MDM  + chest pain after recent emotional stress. Resolved.  Recent stress test and cath.  Cath without stent placement and subsequent medical management. Pt does not desire  to stay in hospital, and declines admission.  Aware that needs urgent cardiac workup.  No pain currently.  No pain x 6hrs with neg ce.  Will dc to oupt fu,  Ret new/worsening sxs        Kyi Romanello Lytle Michaels, MD 12/08/12 2321  Mcihael Hinderman Lytle Michaels, MD 12/08/12 2356  Audre Cenci Lytle Michaels, MD 12/09/12 0001

## 2012-12-09 NOTE — Progress Notes (Signed)
Chaplain responded to request from nursing secretary to visit patient who mentioned the recent death of his brother.  Patient was very talkative regarding his spiritual celebration for his brother's death vs. His personal human emotion of loss. Encounter was 55 minutes. Rev. Audie Box Chaplain [phone]

## 2012-12-09 NOTE — ED Notes (Signed)
Med given, med explained, pt alert, NAD, calm, interactive, sitting in chair, speaking with chaplain.

## 2012-12-14 DIAGNOSIS — I2510 Atherosclerotic heart disease of native coronary artery without angina pectoris: Secondary | ICD-10-CM | POA: Insufficient documentation

## 2014-01-03 ENCOUNTER — Encounter (HOSPITAL_COMMUNITY): Payer: Self-pay | Admitting: Emergency Medicine

## 2014-01-03 DIAGNOSIS — N390 Urinary tract infection, site not specified: Secondary | ICD-10-CM | POA: Insufficient documentation

## 2014-01-03 DIAGNOSIS — N419 Inflammatory disease of prostate, unspecified: Secondary | ICD-10-CM | POA: Insufficient documentation

## 2014-01-03 DIAGNOSIS — I10 Essential (primary) hypertension: Secondary | ICD-10-CM | POA: Insufficient documentation

## 2014-01-03 DIAGNOSIS — R350 Frequency of micturition: Secondary | ICD-10-CM | POA: Insufficient documentation

## 2014-01-03 DIAGNOSIS — F17200 Nicotine dependence, unspecified, uncomplicated: Secondary | ICD-10-CM | POA: Insufficient documentation

## 2014-01-03 DIAGNOSIS — K4090 Unilateral inguinal hernia, without obstruction or gangrene, not specified as recurrent: Secondary | ICD-10-CM | POA: Insufficient documentation

## 2014-01-03 DIAGNOSIS — I252 Old myocardial infarction: Secondary | ICD-10-CM | POA: Insufficient documentation

## 2014-01-03 DIAGNOSIS — Z79899 Other long term (current) drug therapy: Secondary | ICD-10-CM | POA: Insufficient documentation

## 2014-01-03 LAB — URINALYSIS, ROUTINE W REFLEX MICROSCOPIC
Bilirubin Urine: NEGATIVE
GLUCOSE, UA: NEGATIVE mg/dL
HGB URINE DIPSTICK: NEGATIVE
KETONES UR: NEGATIVE mg/dL
Nitrite: NEGATIVE
PROTEIN: NEGATIVE mg/dL
Specific Gravity, Urine: 1.008 (ref 1.005–1.030)
Urobilinogen, UA: 1 mg/dL (ref 0.0–1.0)
pH: 6.5 (ref 5.0–8.0)

## 2014-01-03 LAB — URINE MICROSCOPIC-ADD ON

## 2014-01-03 NOTE — ED Notes (Signed)
thwe pt is c/o drinking water and having to void frequently for several days an he has swelling rt testicle for ?? Length of time

## 2014-01-04 ENCOUNTER — Emergency Department (HOSPITAL_COMMUNITY)
Admission: EM | Admit: 2014-01-04 | Discharge: 2014-01-04 | Disposition: A | Discharge status: Home / Self Care | Payer: BC Managed Care – PPO | Attending: Emergency Medicine | Admitting: Emergency Medicine

## 2014-01-04 DIAGNOSIS — N419 Inflammatory disease of prostate, unspecified: Secondary | ICD-10-CM

## 2014-01-04 DIAGNOSIS — R739 Hyperglycemia, unspecified: Secondary | ICD-10-CM

## 2014-01-04 DIAGNOSIS — N390 Urinary tract infection, site not specified: Secondary | ICD-10-CM

## 2014-01-04 DIAGNOSIS — K4090 Unilateral inguinal hernia, without obstruction or gangrene, not specified as recurrent: Secondary | ICD-10-CM

## 2014-01-04 LAB — GLUCOSE, CAPILLARY: GLUCOSE-CAPILLARY: 155 mg/dL — AB (ref 70–99)

## 2014-01-04 MED ORDER — CIPROFLOXACIN HCL 500 MG PO TABS: 500.0000 mg | ORAL_TABLET | Freq: Two times a day (BID) | ORAL | Status: DC

## 2014-01-04 NOTE — ED Provider Notes (Signed)
CSN: [id number]     Arrival date & time 01/03/14  2302 History   First MD Initiated Contact with Patient 01/04/14 [phone]     Chief Complaint  Patient presents with  . complaints x 2    (Consider location/radiation/quality/duration/timing/severity/associated sxs/prior Treatment) HPI 63 yo male presents to the ER from home with complaint of increased urinary frequency, right groin swelling, and increased thirst.  He has had right groin swelling for some time, denies any pain associated with swelling.  Dysuria ongoing for the last few days.  Similar sxs last month, treated by urgent care with unknown abx.  Some suprapubic discomfort.  No penile discharge.  No fevers.    Past Medical History  Diagnosis Date  . Myocardial infarction   . Hypertension   . Anginal pain    Past Surgical History  Procedure Laterality Date  . Knee surgery    . Cardiac catheterization     No family history on file. History  Substance Use Topics  . Smoking status: Current Every Day Smoker -- 0.50 packs/day for 40 years  . Smokeless tobacco: Never Used  . Alcohol Use: No    Review of Systems  All other systems reviewed and are negative.    Allergies  Review of patient's allergies indicates no known allergies.  Home Medications   Current Outpatient Rx  Name  Route  Sig  Dispense  Refill  . nitroGLYCERIN (NITROSTAT) 0.4 MG SL tablet   Sublingual   Place 1 tablet (0.4 mg total) under the tongue every 5 (five) minutes x 3 doses as needed for chest pain.   30 tablet   12   . ciprofloxacin (CIPRO) 500 MG tablet   Oral   Take 1 tablet (500 mg total) by mouth 2 (two) times daily.   28 tablet   0    BP 111/78  Pulse 89  Temp(Src) 99 F (37.2 C) (Oral)  Resp 18  Ht 5\' 5"  (1.651 m)  Wt 150 lb (68.04 kg)  BMI 24.96 kg/m2  SpO2 99% Physical Exam  Nursing note and vitals reviewed. Constitutional: He is oriented to person, place, and time. He appears well-developed and well-nourished.  HENT:   Head: Normocephalic and atraumatic.  Nose: Nose normal.  Mouth/Throat: Oropharynx is clear and moist.  Eyes: Conjunctivae and EOM are normal. Pupils are equal, round, and reactive to light.  Neck: Normal range of motion. Neck supple. No JVD present. No tracheal deviation present. No thyromegaly present.  Cardiovascular: Normal rate, regular rhythm, normal heart sounds and intact distal pulses.  Exam reveals no gallop and no friction rub.   No murmur heard. Pulmonary/Chest: Effort normal and breath sounds normal. No stridor. No respiratory distress. He has no wheezes. He has no rales. He exhibits no tenderness.  Abdominal: Soft. Bowel sounds are normal. He exhibits mass (reducible hernia noted right inguinal region). He exhibits no distension. There is no tenderness. There is no rebound and no guarding.  Genitourinary: Guaiac negative stool. No penile tenderness.  Mildly enlarged prostate, nontender, slight bogginess  Musculoskeletal: Normal range of motion. He exhibits no edema and no tenderness.  Lymphadenopathy:    He has no cervical adenopathy.  Neurological: He is alert and oriented to person, place, and time. He exhibits normal muscle tone. Coordination normal.  Skin: Skin is warm and dry. No rash noted. No erythema. No pallor.  Psychiatric: He has a normal mood and affect. His behavior is normal. Judgment and thought content normal.    ED  Course  Procedures (including critical care time) Labs Review Labs Reviewed  URINALYSIS, ROUTINE W REFLEX MICROSCOPIC - Abnormal; Notable for the following:    Leukocytes, UA MODERATE (*)    All other components within normal limits  GLUCOSE, CAPILLARY - Abnormal; Notable for the following:    Glucose-Capillary 155 (*)    All other components within normal limits  URINE CULTURE  URINE MICROSCOPIC-ADD ON   Imaging Review No results found.  EKG Interpretation   None       MDM   1. Left inguinal hernia   2. Elevated blood sugar   3.  Urinary tract infection   4. Prostatitis    63 year old male with dysuria.  Reports recent treatment in the last month for UTI.  Appears to have UTI on exam.  He has no prostate tenderness.  On rectal exam, but prostate is slightly boggy.  Concern for prostatitis.  Plan to treat with Cipro.  Patient also incidentally noted to have right inguinal hernia.  Patient instructed to followup with surgery as needed for this.  His blood sugar was elevated, 155.  He is to followup with his primary care Dr. for recheck    Kalman Drape, MD 01/04/14 [phone]

## 2014-01-04 NOTE — Discharge Instructions (Signed)
Please take medication as prescribed.  Follow up with your doctor for recheck of your blood sugar in 5-7 days.  Follow up with the urology office for recheck of your urinary tract infection/prostate problems.  If your hernia is causing you problems, worsening pain, or bulging out and not going back in, please contact the surgery office.    Inguinal Hernia, Adult Muscles help keep everything in the body in its proper place. But if a weak spot in the muscles develops, something can poke through. That is called a hernia. When this happens in the lower part of the belly (abdomen), it is called an inguinal hernia. (It takes its name from a part of the body in this region called the inguinal canal.) A weak spot in the wall of muscles lets some fat or part of the small intestine bulge through. An inguinal hernia can develop at any age. Men get them more often than women. CAUSES  In adults, an inguinal hernia develops over time.  It can be triggered by:  Suddenly straining the muscles of the lower abdomen.  Lifting heavy objects.  Straining to have a bowel movement. Difficult bowel movements (constipation) can lead to this.  Constant coughing. This may be caused by smoking or lung disease.  Being overweight.  Being pregnant.  Working at a job that requires long periods of standing or heavy lifting.  Having had an inguinal hernia before. One type can be an emergency situation. It is called a strangulated inguinal hernia. It develops if part of the small intestine slips through the weak spot and cannot get back into the abdomen. The blood supply can be cut off. If that happens, part of the intestine may die. This situation requires emergency surgery. SYMPTOMS  Often, a small inguinal hernia has no symptoms. It is found when a healthcare provider does a physical exam. Larger hernias usually have symptoms.   In adults, symptoms may include:  A lump in the groin. This is easier to see when the  person is standing. It might disappear when lying down.  In men, a lump in the scrotum.  Pain or burning in the groin. This occurs especially when lifting, straining or coughing.  A dull ache or feeling of pressure in the groin.  Signs of a strangulated hernia can include:  A bulge in the groin that becomes very painful and tender to the touch.  A bulge that turns red or purple.  Fever, nausea and vomiting.  Inability to have a bowel movement or to Manninen gas. DIAGNOSIS  To decide if you have an inguinal hernia, a healthcare provider will probably do a physical examination.  This will include asking questions about any symptoms you have noticed.  The healthcare provider might feel the groin area and ask you to cough. If an inguinal hernia is felt, the healthcare provider may try to slide it back into the abdomen.  Usually no other tests are needed. TREATMENT  Treatments can vary. The size of the hernia makes a difference. Options include:  Watchful waiting. This is often suggested if the hernia is small and you have had no symptoms.  No medical procedure will be done unless symptoms develop.  You will need to watch closely for symptoms. If any occur, contact your healthcare provider right away.  Surgery. This is used if the hernia is larger or you have symptoms.  Open surgery. This is usually an outpatient procedure (you will not stay overnight in a hospital). An cut (  incision) is made through the skin in the groin. The hernia is put back inside the abdomen. The weak area in the muscles is then repaired by herniorrhaphy or hernioplasty. Herniorrhaphy: in this type of surgery, the weak muscles are sewn back together. Hernioplasty: a patch or mesh is used to close the weak area in the abdominal wall.  Laparoscopy. In this procedure, a surgeon makes small incisions. A thin tube with a tiny video camera (called a laparoscope) is put into the abdomen. The surgeon repairs the hernia  with mesh by looking with the video camera and using two long instruments. HOME CARE INSTRUCTIONS   After surgery to repair an inguinal hernia:  You will need to take pain medicine prescribed by your healthcare provider. Follow all directions carefully.  You will need to take care of the wound from the incision.  Your activity will be restricted for awhile. This will probably include no heavy lifting for several weeks. You also should not do anything too active for a few weeks. When you can return to work will depend on the type of job that you have.  During "watchful waiting" periods, you should:  Maintain a healthy weight.  Eat a diet high in fiber (fruits, vegetables and whole grains).  Drink plenty of fluids to avoid constipation. This means drinking enough water and other liquids to keep your urine clear or pale yellow.  Do not lift heavy objects.  Do not stand for long periods of time.  Quit smoking. This should keep you from developing a frequent cough. SEEK MEDICAL CARE IF:   A bulge develops in your groin area.  You feel pain, a burning sensation or pressure in the groin. This might be worse if you are lifting or straining.  You develop a fever of more than 100.5 F (38.1 C). SEEK IMMEDIATE MEDICAL CARE IF:   Pain in the groin increases suddenly.  A bulge in the groin gets bigger suddenly and does not go down.  For men, there is sudden pain in the scrotum. Or, the size of the scrotum increases.  A bulge in the groin area becomes red or purple and is painful to touch.  You have nausea or vomiting that does not go away.  You feel your heart beating much faster than normal.  You cannot have a bowel movement or Vangorder gas.  You develop a fever of more than 102.0 F (38.9 C). Document Released: 04/18/2009 Document Revised: 02/22/2012 Document Reviewed: 04/18/2009 [facility] Patient Information 2014 [place], [place].  Prostatitis Prostatitis is redness, soreness,  and puffiness (swelling) of the prostate gland. The prostate gland is the walnut-sized gland located just below your bladder. HOME CARE:   Take all medicines as told by your doctor.  Take warm-water baths (sitz baths) as told by your doctor. GET HELP IF:  Your symptoms get worse, not better.  You have a fever. GET HELP RIGHT AWAY IF:   You have chills.  You feel sick to your stomach (nauseous) or like you will throw up (vomit).  You feel lightheaded or like you will Weidemann out (faint).  You are unable to pee (urinate).  You have blood or blood clumps (clots) in your pee (urine). MAKE SURE YOU:  Understand these instructions.  Will watch your condition.  Will get help right away if you are not doing well or get worse. Document Released: 05/31/2012 Document Revised: 08/02/2013 Document Reviewed: 06/19/2013 [facility] Patient Information 2014 [place], [place].  Urinary Tract Infection Urinary tract infections (  UTIs) can develop anywhere along your urinary tract. Your urinary tract is your body's drainage system for removing wastes and extra water. Your urinary tract includes two kidneys, two ureters, a bladder, and a urethra. Your kidneys are a pair of bean-shaped organs. Each kidney is about the size of your fist. They are located below your ribs, one on each side of your spine. CAUSES Infections are caused by microbes, which are microscopic organisms, including fungi, viruses, and bacteria. These organisms are so small that they can only be seen through a microscope. Bacteria are the microbes that most commonly cause UTIs. SYMPTOMS  Symptoms of UTIs may vary by age and gender of the patient and by the location of the infection. Symptoms in young women typically include a frequent and intense urge to urinate and a painful, burning feeling in the bladder or urethra during urination. Older women and men are more likely to be tired, shaky, and weak and have muscle aches and abdominal pain.  A fever may mean the infection is in your kidneys. Other symptoms of a kidney infection include pain in your back or sides below the ribs, nausea, and vomiting. DIAGNOSIS To diagnose a UTI, your caregiver will ask you about your symptoms. Your caregiver also will ask to provide a urine sample. The urine sample will be tested for bacteria and white blood cells. White blood cells are made by your body to help fight infection. TREATMENT  Typically, UTIs can be treated with medication. Because most UTIs are caused by a bacterial infection, they usually can be treated with the use of antibiotics. The choice of antibiotic and length of treatment depend on your symptoms and the type of bacteria causing your infection. HOME CARE INSTRUCTIONS  If you were prescribed antibiotics, take them exactly as your caregiver instructs you. Finish the medication even if you feel better after you have only taken some of the medication.  Drink enough water and fluids to keep your urine clear or pale yellow.  Avoid caffeine, tea, and carbonated beverages. They tend to irritate your bladder.  Empty your bladder often. Avoid holding urine for long periods of time.  Empty your bladder before and after sexual intercourse.  After a bowel movement, women should cleanse from front to back. Use each tissue only once. SEEK MEDICAL CARE IF:   You have back pain.  You develop a fever.  Your symptoms do not begin to resolve within 3 days. SEEK IMMEDIATE MEDICAL CARE IF:   You have severe back pain or lower abdominal pain.  You develop chills.  You have nausea or vomiting.  You have continued burning or discomfort with urination. MAKE SURE YOU:   Understand these instructions.  Will watch your condition.  Will get help right away if you are not doing well or get worse. Document Released: 09/09/2005 Document Revised: 05/31/2012 Document Reviewed: 01/08/2012 [facility] Patient Information 2014 [place].  High Blood Sugar High blood sugar (hyperglycemia) means that the level of sugar in your blood is higher than it should be. Signs of high blood sugar include:  Feeling thirsty.  Frequent peeing (urinating).  Feeling tired or sleepy.  Dry mouth.  Vision changes.  Feeling weak.  Feeling hungry but losing weight.  Numbness and tingling in your hands or feet.  Headache. When you ignore these signs, your blood sugar may keep going up. These problems may get worse, and other problems may begin. HOME CARE  Check your blood sugars as told by your doctor.  Write down the numbers with the date and time.  Take the right amount of insulin or diabetes pills at the right time. Write down the dose with date and time.  Refill your insulin or diabetes pills before running out.  Watch what you eat. Follow your meal plan.  Drink liquids without sugar, such as water. Check with your doctor if you have kidney or heart disease.  Follow your doctor's orders for exercise. Exercise at the same time of day.  Keep your doctor's appointments. GET HELP RIGHT AWAY IF:   You have trouble thinking or are confused.  You have fast breathing with fruity smelling breath.  You Weiskopf out (faint).  You have 2 to 3 days of high blood sugars and you do not know why.  You have chest pain.  You are feeling sick to your stomach (nauseous) or throwing up (vomiting).  You have sudden vision changes. MAKE SURE YOU:   Understand these instructions.  Will watch your condition.  Will get help right away if you are not doing well or get worse. Document Released: 09/27/2009 Document Revised: 02/22/2012 Document Reviewed: 09/27/2009 [facility] Patient Information 2014 [place], [place].

## 2015-07-10 DIAGNOSIS — M545 Low back pain, unspecified: Secondary | ICD-10-CM | POA: Insufficient documentation

## 2015-07-10 DIAGNOSIS — G8929 Other chronic pain: Secondary | ICD-10-CM | POA: Insufficient documentation

## 2016-01-24 DIAGNOSIS — K4020 Bilateral inguinal hernia, without obstruction or gangrene, not specified as recurrent: Secondary | ICD-10-CM | POA: Insufficient documentation

## 2017-05-07 ENCOUNTER — Encounter (HOSPITAL_COMMUNITY): Payer: Self-pay | Admitting: Emergency Medicine

## 2017-05-07 DIAGNOSIS — I10 Essential (primary) hypertension: Secondary | ICD-10-CM | POA: Diagnosis not present

## 2017-05-07 DIAGNOSIS — R1031 Right lower quadrant pain: Secondary | ICD-10-CM | POA: Diagnosis present

## 2017-05-07 DIAGNOSIS — F17200 Nicotine dependence, unspecified, uncomplicated: Secondary | ICD-10-CM | POA: Diagnosis not present

## 2017-05-07 DIAGNOSIS — Z79899 Other long term (current) drug therapy: Secondary | ICD-10-CM | POA: Diagnosis not present

## 2017-05-07 LAB — URINALYSIS, ROUTINE W REFLEX MICROSCOPIC
BACTERIA UA: NONE SEEN
Bilirubin Urine: NEGATIVE
Glucose, UA: NEGATIVE mg/dL
Ketones, ur: NEGATIVE mg/dL
Leukocytes, UA: NEGATIVE
Nitrite: NEGATIVE
PH: 6 (ref 5.0–8.0)
Protein, ur: 30 mg/dL — AB
SPECIFIC GRAVITY, URINE: 1.013 (ref 1.005–1.030)

## 2017-05-07 NOTE — ED Triage Notes (Signed)
Pt presents to ED for right flank pain since this morning, now radiating around to the front.  Pt denies any urinary changes, but does have a hx of kidney stones.  Pt c/o increased pain with palpation and movement.

## 2017-05-08 ENCOUNTER — Emergency Department (HOSPITAL_COMMUNITY): Payer: Medicare Other

## 2017-05-08 ENCOUNTER — Emergency Department (HOSPITAL_COMMUNITY)
Admission: EM | Admit: 2017-05-08 | Discharge: 2017-05-08 | Disposition: A | Discharge status: Home / Self Care | Payer: Medicare Other | Attending: Emergency Medicine | Admitting: Emergency Medicine

## 2017-05-08 DIAGNOSIS — R109 Unspecified abdominal pain: Secondary | ICD-10-CM

## 2017-05-08 LAB — CBC WITH DIFFERENTIAL/PLATELET
BASOS PCT: 0 %
Basophils Absolute: 0 10*3/uL (ref 0.0–0.1)
EOS ABS: 0.1 10*3/uL (ref 0.0–0.7)
EOS PCT: 1 %
HCT: 38.1 % — ABNORMAL LOW (ref 39.0–52.0)
Hemoglobin: 12.8 g/dL — ABNORMAL LOW (ref 13.0–17.0)
Lymphocytes Relative: 25 %
Lymphs Abs: 2.1 10*3/uL (ref 0.7–4.0)
MCH: 29.3 pg (ref 26.0–34.0)
MCHC: 33.6 g/dL (ref 30.0–36.0)
MCV: 87.2 fL (ref 78.0–100.0)
Monocytes Absolute: 0.7 10*3/uL (ref 0.1–1.0)
Monocytes Relative: 8 %
Neutro Abs: 5.4 10*3/uL (ref 1.7–7.7)
Neutrophils Relative %: 66 %
PLATELETS: 196 10*3/uL (ref 150–400)
RBC: 4.37 MIL/uL (ref 4.22–5.81)
RDW: 13.2 % (ref 11.5–15.5)
WBC: 8.3 10*3/uL (ref 4.0–10.5)

## 2017-05-08 LAB — BASIC METABOLIC PANEL
Anion gap: 9 (ref 5–15)
BUN: 11 mg/dL (ref 6–20)
CHLORIDE: 107 mmol/L (ref 101–111)
CO2: 22 mmol/L (ref 22–32)
CREATININE: 0.96 mg/dL (ref 0.61–1.24)
Calcium: 8.7 mg/dL — ABNORMAL LOW (ref 8.9–10.3)
Glucose, Bld: 160 mg/dL — ABNORMAL HIGH (ref 65–99)
POTASSIUM: 3.5 mmol/L (ref 3.5–5.1)
SODIUM: 138 mmol/L (ref 135–145)

## 2017-05-08 IMAGING — CT CT RENAL STONE PROTOCOL
2 of 4 series · 16 of 46 positions shown, 18 images · non-contrast
Comparison: None.

CLINICAL DATA: RIGHT flank pain for 2 days radiating to anterior
abdomen, worse with palpation and movement. History of kidney
stones.

EXAM:
CT ABDOMEN AND PELVIS WITHOUT CONTRAST
TECHNIQUE: Multidetector CT imaging of the abdomen and pelvis was performed
following the standard protocol without IV contrast.

[Series 3: stone study 5.0 i30f 2 · axial · 0.67mm/px · z∈[+945,+1350]mm · 13 of 89 slices shown, 15 images]
[im 4/89  soft-tissue]
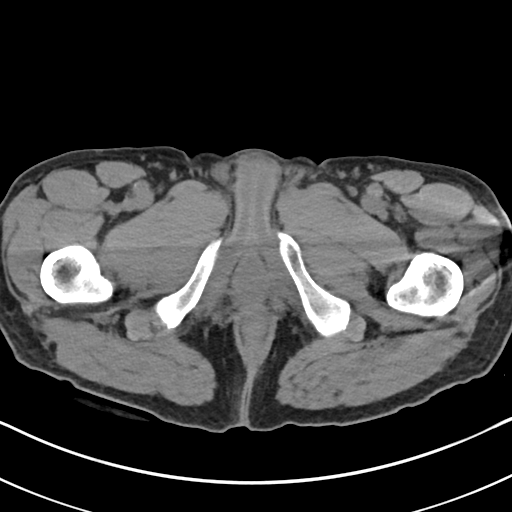
[im 4/89  bone]
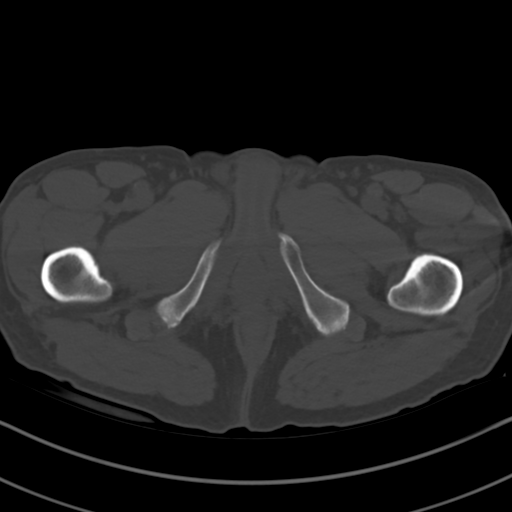
[im 11/89  soft-tissue]
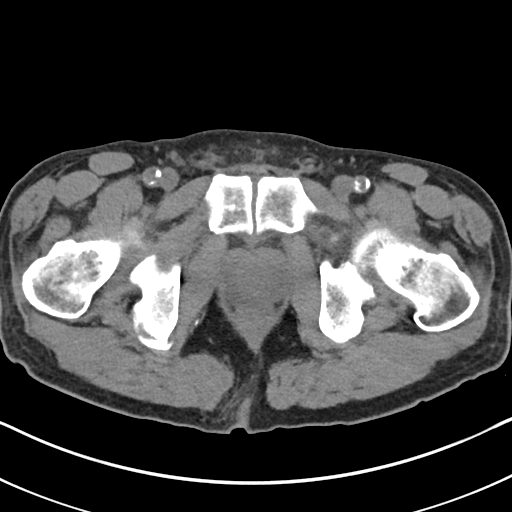
[im 17/89  soft-tissue]
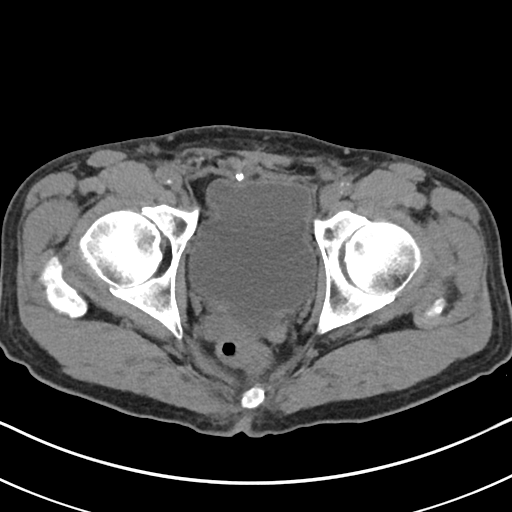
[im 24/89  soft-tissue]
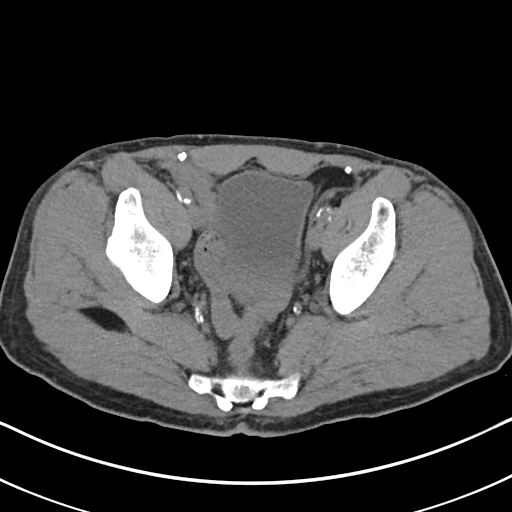
[im 31/89  soft-tissue]
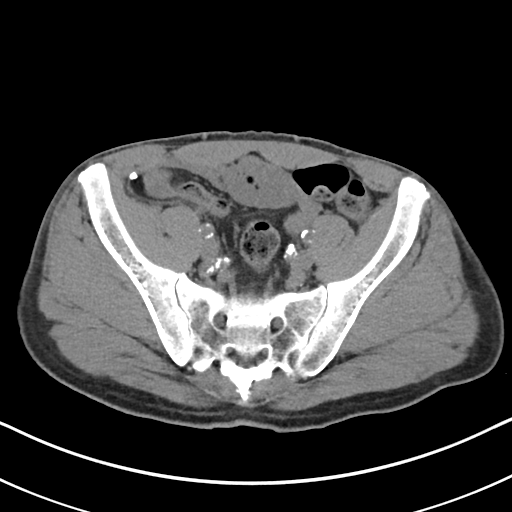
[im 38/89  soft-tissue]
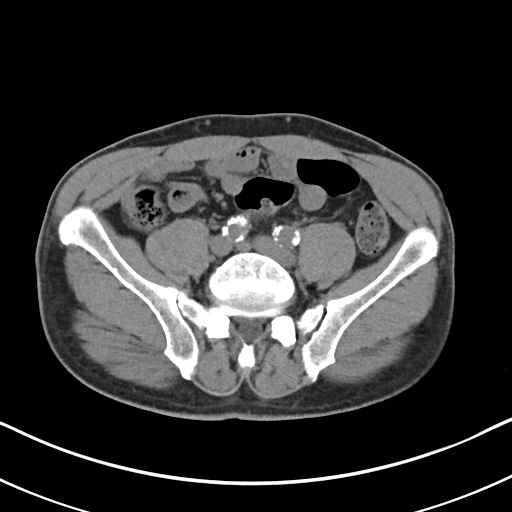
[im 45/89  soft-tissue]
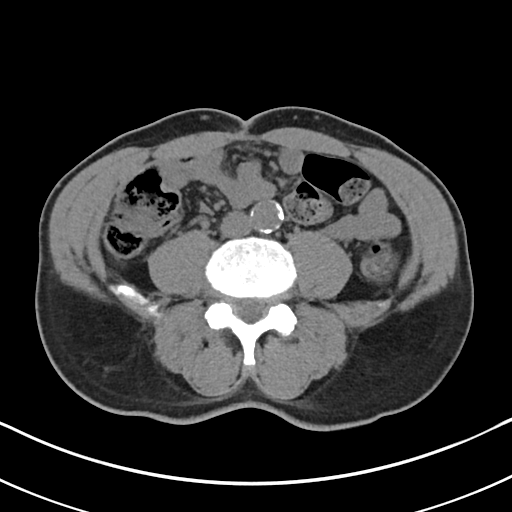
[im 51/89  soft-tissue]
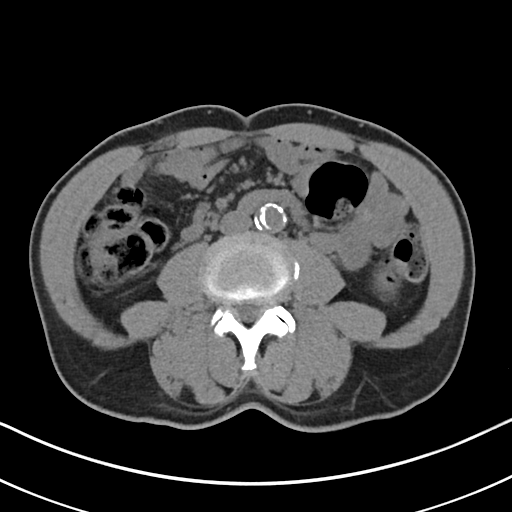
[im 58/89  soft-tissue]
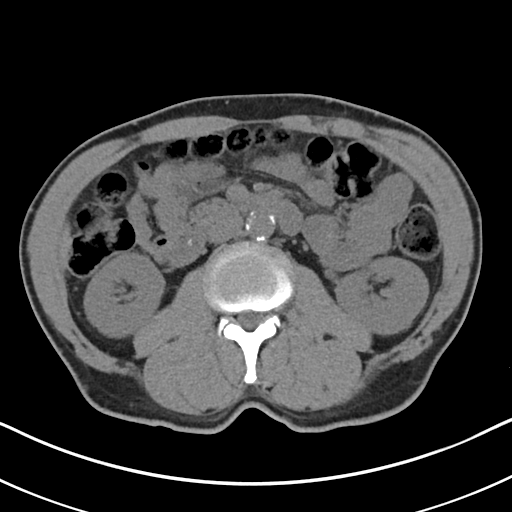
[im 58/89  bone]
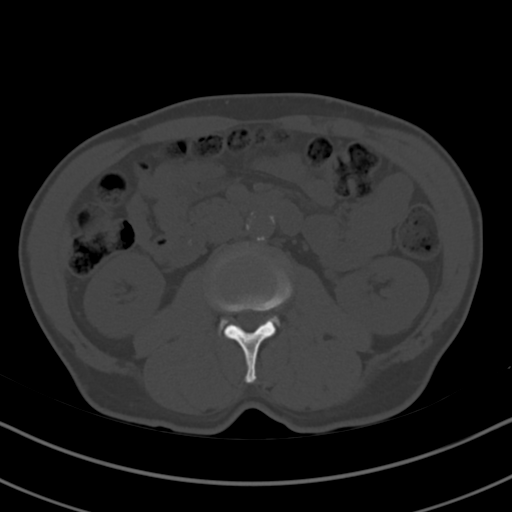
[im 65/89  soft-tissue]
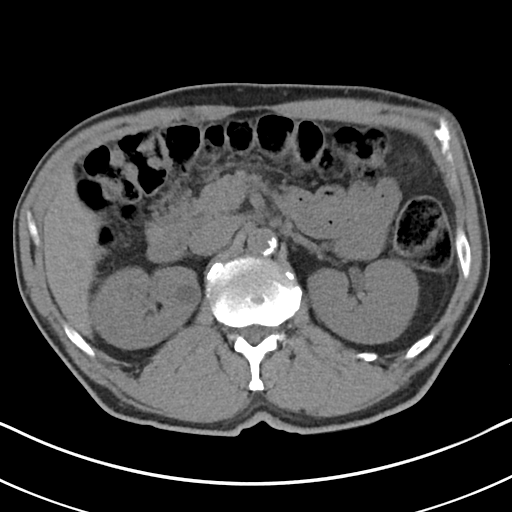
[im 72/89  soft-tissue]
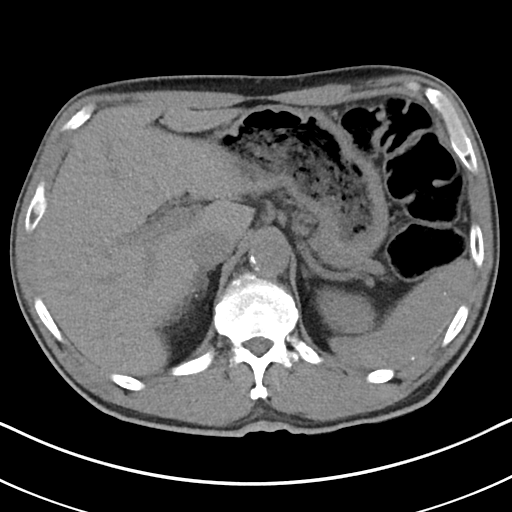
[im 78/89  soft-tissue]
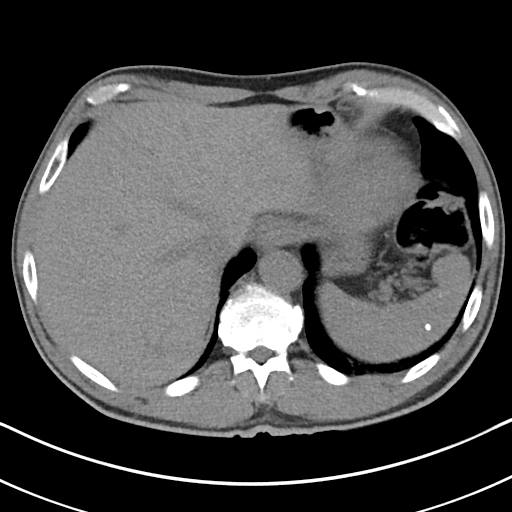
[im 85/89  soft-tissue]
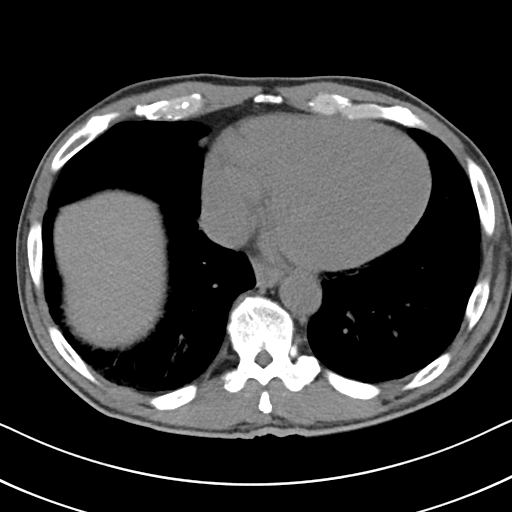

[Series 6: coronal soft tissue · coronal · 0.76mm/px · 3 of 78 slices shown]
[im 26/78  soft-tissue]
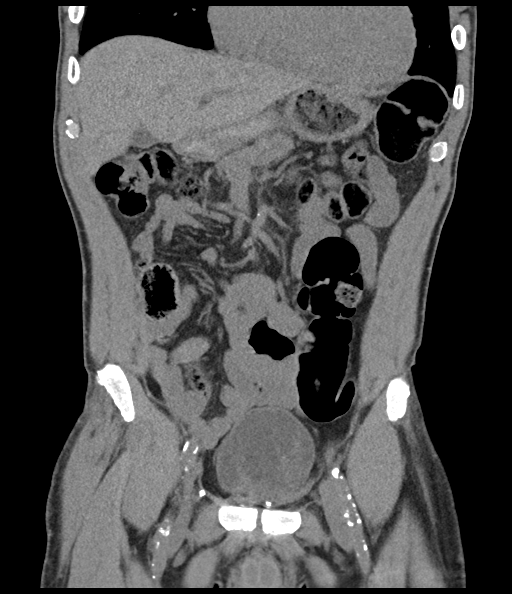
[im 35/78  soft-tissue]
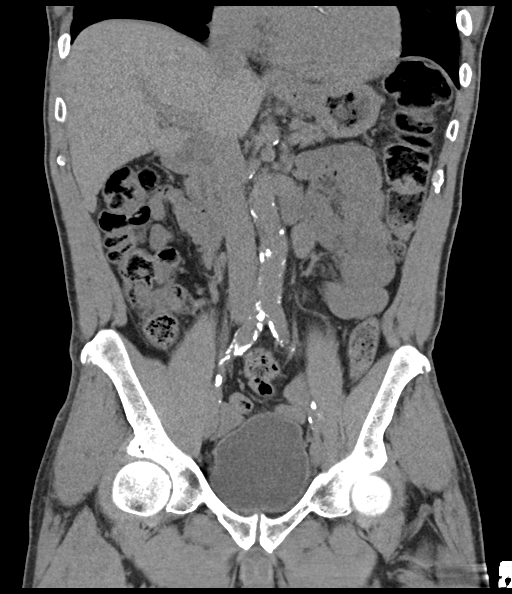
[im 43/78  soft-tissue]
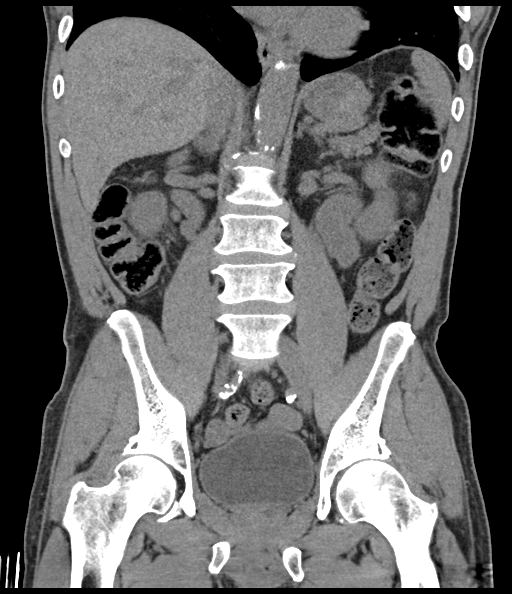

[16 of 46 positions shown; findings below may reference images not displayed]

FINDINGS: LOWER CHEST: Dependent atelectasis. Mild cardiomegaly. No
pericardial effusions.

HEPATOBILIARY: Normal.

PANCREAS: Mildly atrophic pancreatic tail without acute pancreatic
process by noncontrast CT.

SPLEEN: Calcified granulomas, otherwise unremarkable.

ADRENALS/URINARY TRACT: Kidneys are orthotopic, demonstrating normal
size and morphology. No nephrolithiasis, hydronephrosis; limited
assessment for renal masses on this nonenhanced examination. 14 mm
LEFT interpolar and 17 mm RIGHT upper cysts. The unopacified ureters
are normal in course and caliber. Urinary bladder is well distended
and unremarkable. Normal adrenal glands.

STOMACH/BOWEL: The stomach, small and large bowel are normal in
course and caliber without inflammatory changes, sensitivity
decreased by lack of enteric contrast. Moderate retained large bowel
stool. Normal appendix.

VASCULAR/LYMPHATIC: Moderate aortoiliac calcific atherosclerosis
with old infrarenal aortic dissection. No lymphadenopathy by CT size
criteria.

REPRODUCTIVE: Mild prostatomegaly.

OTHER: No intraperitoneal free fluid or free air.

MUSCULOSKELETAL: Non-acute. Status post inguinal herniorrhaphy with
mesh. Moderate sacroiliac osteoarthrosis.
IMPRESSION: No urolithiasis, obstructive uropathy nor acute intra-abdominal/
pelvic process. Normal appendix.

Moderate retained large bowel stool.

Mild cardiomegaly.

## 2017-05-08 MED ORDER — FENTANYL CITRATE (PF) 100 MCG/2ML IJ SOLN
50.0000 ug | Freq: Once | INTRAMUSCULAR | Status: AC
  Administered 2017-05-08: 50 ug via INTRAVENOUS
  Filled 2017-05-08: qty 2

## 2017-05-08 NOTE — ED Provider Notes (Signed)
Medical screening examination/treatment/procedure(s) were conducted as a shared visit with non-physician practitioner(s) and myself.  I personally evaluated the patient during the encounter.   EKG Interpretation None       By signing my name below, I, Dyke Brackett, attest that this documentation has been prepared under the direction and in the presence of Champ, Keilee Denman, MD . Electronically Signed: Dyke Brackett, Scribe. 05/08/2017. 3:45 AM.   Pt reports right flank pain onset this morning, now radiating to front. Pt denies any urinary changes.  PE: Lungs CTA, regular rate and rhythm, normal bowel sounds, abdomen is soft, moist mucus membranes. Pt is intoxicated and smells of alcohol.   I personally performed the services described in this documentation, which was scribed in my presence. The recorded information has been reviewed and is accurate.      Valen Mascaro, MD 05/08/17 [id number]

## 2017-05-08 NOTE — ED Provider Notes (Signed)
[facility] DEPT Provider Note   CSN: [id number] Arrival date & time: 05/07/17  2024     History   Chief Complaint Chief Complaint  Patient presents with  . Flank Pain    HPI Juan Norris is a 66 y.o. male.  Patient presents to the emergency department with chief complaint of right flank pain. States the pain started this morning. He reports that it radiates around to his right lower quadrant. He denies any dysuria or hematuria. He does report history of kidney stones. Denies any fevers, chills, nausea, vomiting. He has not taken anything for symptoms. There are no other associated symptoms. He endorses some tenderness to palpation.   The history is provided by the patient. No language interpreter was used.    Past Medical History:  Diagnosis Date  . Anginal pain ([place])   . Hypertension   . Myocardial infarction [facility])     Patient Active Problem List   Diagnosis Date Noted  . Chest pain 04/07/2012  . History of heart attack 04/07/2012    Past Surgical History:  Procedure Laterality Date  . CARDIAC CATHETERIZATION    . KNEE SURGERY         Home Medications    Prior to Admission medications   Medication Sig Start Date End Date Taking? Authorizing Provider  ciprofloxacin (CIPRO) 500 MG tablet Take 1 tablet (500 mg total) by mouth 2 (two) times daily. 01/04/14   Linton Flemings, MD  nitroGLYCERIN (NITROSTAT) 0.4 MG SL tablet Place 1 tablet (0.4 mg total) under the tongue every 5 (five) minutes x 3 doses as needed for chest pain. 04/08/12 01/04/14  Jerline Pain, MD    Family History History reviewed. No pertinent family history.  Social History Social History  Substance Use Topics  . Smoking status: Current Some Day Smoker    Packs/day: 0.50    Years: 40.00  . Smokeless tobacco: Never Used  . Alcohol use No     Allergies   Patient has no known allergies.   Review of Systems Review of Systems  All other systems reviewed and are negative.    Physical  Exam Updated Vital Signs BP (!) 153/85 (BP Location: Right Arm)   Pulse 94   Temp 97.7 F (36.5 C) (Oral)   Resp 18   Ht 5\' 7"  (1.702 m)   Wt 74.2 kg (163 lb 9.6 oz)   SpO2 100%   BMI 25.62 kg/m   Physical Exam  Constitutional: He is oriented to person, place, and time. He appears well-developed and well-nourished.  HENT:  Head: Normocephalic and atraumatic.  Eyes: Conjunctivae and EOM are normal. Pupils are equal, round, and reactive to light. Right eye exhibits no discharge. Left eye exhibits no discharge. No scleral icterus.  Neck: Normal range of motion. Neck supple. No JVD present.  Cardiovascular: Normal rate, regular rhythm and normal heart sounds.  Exam reveals no gallop and no friction rub.   No murmur heard. Pulmonary/Chest: Effort normal and breath sounds normal. No respiratory distress. He has no wheezes. He has no rales. He exhibits no tenderness.  Abdominal: Soft. He exhibits no distension and no mass. There is no tenderness. There is no rebound and no guarding.  Right flank somewhat tender to palpation  Musculoskeletal: Normal range of motion. He exhibits no edema or tenderness.  Neurological: He is alert and oriented to person, place, and time.  Skin: Skin is warm and dry.  Psychiatric: He has a normal mood and affect. His behavior  is normal. Judgment and thought content normal.  Nursing note and vitals reviewed.    ED Treatments / Results  Labs (all labs ordered are listed, but only abnormal results are displayed) Labs Reviewed  URINALYSIS, ROUTINE W REFLEX MICROSCOPIC - Abnormal; Notable for the following:       Result Value   Hgb urine dipstick SMALL (*)    Protein, ur 30 (*)    Squamous Epithelial / LPF 0-5 (*)    All other components within normal limits  CBC WITH DIFFERENTIAL/PLATELET - Abnormal; Notable for the following:    Hemoglobin 12.8 (*)    HCT 38.1 (*)    All other components within normal limits  BASIC METABOLIC PANEL - Abnormal; Notable  for the following:    Glucose, Bld 160 (*)    Calcium 8.7 (*)    All other components within normal limits    EKG  EKG Interpretation None       Radiology Ct Renal Stone Study  Result Date: 05/08/2017 CLINICAL DATA:  RIGHT flank pain for 2 days radiating to anterior abdomen, worse with palpation and movement. History of kidney stones. EXAM: CT ABDOMEN AND PELVIS WITHOUT CONTRAST TECHNIQUE: Multidetector CT imaging of the abdomen and pelvis was performed following the standard protocol without IV contrast. COMPARISON:  None. FINDINGS: LOWER CHEST: Dependent atelectasis. Mild cardiomegaly. No pericardial effusions. HEPATOBILIARY: Normal. PANCREAS: Mildly atrophic pancreatic tail without acute pancreatic process by noncontrast CT. SPLEEN: Calcified granulomas, otherwise unremarkable. ADRENALS/URINARY TRACT: Kidneys are orthotopic, demonstrating normal size and morphology. No nephrolithiasis, hydronephrosis; limited assessment for renal masses on this nonenhanced examination. 14 mm LEFT interpolar and 17 mm RIGHT upper cysts. The unopacified ureters are normal in course and caliber. Urinary bladder is well distended and unremarkable. Normal adrenal glands. STOMACH/BOWEL: The stomach, small and large bowel are normal in course and caliber without inflammatory changes, sensitivity decreased by lack of enteric contrast. Moderate retained large bowel stool. Normal appendix. 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 No lymphadenopathy by CT size criteria. REPRODUCTIVE: Mild prostatomegaly. OTHER: No intraperitoneal free fluid or free air. MUSCULOSKELETAL: Non-acute. Status post inguinal herniorrhaphy with mesh. Moderate sacroiliac osteoarthrosis. IMPRESSION: No urolithiasis, obstructive uropathy nor acute intra-abdominal/ pelvic process. Normal appendix. Moderate retained large bowel stool. Mild cardiomegaly. Electronically Signed   By: Elon Alas M.D.   On: 05/08/2017 01:17    Procedures Procedures (including critical care time)  Medications Ordered in ED Medications  fentaNYL (SUBLIMAZE) injection 50 mcg (50 mcg Intravenous Given 05/08/17 0153)     Initial Impression / Assessment and Plan / ED Course  I have reviewed the triage vital signs and the nursing notes.  Pertinent labs & imaging results that were available during my care of the patient were reviewed by me and considered in my medical decision making (see chart for details).     Patient with right flank pain. No hematuria. CT scan is negative for renal stone. Patient does have some tenderness to palpation of the right flank. Question whether he has some lumbar radiculopathy. His vital signs are stable. He is well-appearing. He is appropriate for outpatient follow-up. I discussed plan with patient. He understands and agrees the plan. He is stable and ready for discharge.  Final Clinical Impressions(s) / ED Diagnoses   Final diagnoses:  Right flank pain    New Prescriptions New Prescriptions   No medications on file     Delaine Lame 05/08/17 [id number]    Palumbo, April, MD 05/08/17  0619  

## 2017-05-08 NOTE — ED Notes (Signed)
Patient transported to CT 

## 2018-03-14 DIAGNOSIS — I219 Acute myocardial infarction, unspecified: Secondary | ICD-10-CM

## 2018-03-14 HISTORY — DX: Acute myocardial infarction, unspecified: I21.9

## 2018-03-29 ENCOUNTER — Inpatient Hospital Stay (HOSPITAL_COMMUNITY)
Admission: EM | Admit: 2018-03-29 | Discharge: 2018-03-31 | DRG: 280 | Disposition: A | Discharge status: Home / Self Care | Payer: Medicare Other | Attending: Cardiology | Admitting: Cardiology

## 2018-03-29 ENCOUNTER — Other Ambulatory Visit: Payer: Self-pay

## 2018-03-29 ENCOUNTER — Ambulatory Visit (HOSPITAL_COMMUNITY)
Admission: EM | Admit: 2018-03-29 | Discharge: 2018-03-29 | Disposition: A | Discharge status: Home / Self Care | Payer: Medicare Other | Source: Home / Self Care

## 2018-03-29 ENCOUNTER — Emergency Department (HOSPITAL_COMMUNITY): Payer: Medicare Other

## 2018-03-29 ENCOUNTER — Encounter (HOSPITAL_COMMUNITY): Payer: Self-pay | Admitting: Emergency Medicine

## 2018-03-29 DIAGNOSIS — I5041 Acute combined systolic (congestive) and diastolic (congestive) heart failure: Secondary | ICD-10-CM

## 2018-03-29 DIAGNOSIS — I440 Atrioventricular block, first degree: Secondary | ICD-10-CM | POA: Diagnosis present

## 2018-03-29 DIAGNOSIS — N289 Disorder of kidney and ureter, unspecified: Secondary | ICD-10-CM

## 2018-03-29 DIAGNOSIS — I110 Hypertensive heart disease with heart failure: Secondary | ICD-10-CM | POA: Diagnosis present

## 2018-03-29 DIAGNOSIS — I208 Other forms of angina pectoris: Secondary | ICD-10-CM

## 2018-03-29 DIAGNOSIS — I5043 Acute on chronic combined systolic (congestive) and diastolic (congestive) heart failure: Secondary | ICD-10-CM | POA: Diagnosis present

## 2018-03-29 DIAGNOSIS — I214 Non-ST elevation (NSTEMI) myocardial infarction: Principal | ICD-10-CM | POA: Diagnosis present

## 2018-03-29 DIAGNOSIS — I252 Old myocardial infarction: Secondary | ICD-10-CM

## 2018-03-29 DIAGNOSIS — R748 Abnormal levels of other serum enzymes: Secondary | ICD-10-CM | POA: Diagnosis not present

## 2018-03-29 DIAGNOSIS — F17200 Nicotine dependence, unspecified, uncomplicated: Secondary | ICD-10-CM | POA: Diagnosis present

## 2018-03-29 DIAGNOSIS — I10 Essential (primary) hypertension: Secondary | ICD-10-CM | POA: Diagnosis not present

## 2018-03-29 DIAGNOSIS — I428 Other cardiomyopathies: Secondary | ICD-10-CM | POA: Diagnosis present

## 2018-03-29 DIAGNOSIS — I2510 Atherosclerotic heart disease of native coronary artery without angina pectoris: Secondary | ICD-10-CM | POA: Diagnosis present

## 2018-03-29 DIAGNOSIS — I340 Nonrheumatic mitral (valve) insufficiency: Secondary | ICD-10-CM | POA: Diagnosis present

## 2018-03-29 DIAGNOSIS — R7989 Other specified abnormal findings of blood chemistry: Secondary | ICD-10-CM

## 2018-03-29 DIAGNOSIS — I361 Nonrheumatic tricuspid (valve) insufficiency: Secondary | ICD-10-CM | POA: Diagnosis not present

## 2018-03-29 DIAGNOSIS — R001 Bradycardia, unspecified: Secondary | ICD-10-CM | POA: Diagnosis not present

## 2018-03-29 DIAGNOSIS — Z8249 Family history of ischemic heart disease and other diseases of the circulatory system: Secondary | ICD-10-CM

## 2018-03-29 DIAGNOSIS — I447 Left bundle-branch block, unspecified: Secondary | ICD-10-CM | POA: Diagnosis present

## 2018-03-29 DIAGNOSIS — R0602 Shortness of breath: Secondary | ICD-10-CM | POA: Diagnosis not present

## 2018-03-29 DIAGNOSIS — R778 Other specified abnormalities of plasma proteins: Secondary | ICD-10-CM

## 2018-03-29 DIAGNOSIS — Z720 Tobacco use: Secondary | ICD-10-CM | POA: Diagnosis not present

## 2018-03-29 HISTORY — DX: Tobacco use: Z72.0

## 2018-03-29 HISTORY — DX: Disorder of kidney and ureter, unspecified: N28.9

## 2018-03-29 HISTORY — DX: Unspecified systolic (congestive) heart failure: I50.20

## 2018-03-29 LAB — HEMOGLOBIN A1C
Hgb A1c MFr Bld: 5.7 % — ABNORMAL HIGH (ref 4.8–5.6)
Mean Plasma Glucose: 116.89 mg/dL

## 2018-03-29 LAB — BASIC METABOLIC PANEL
ANION GAP: 7 (ref 5–15)
BUN: 15 mg/dL (ref 6–20)
CALCIUM: 9.4 mg/dL (ref 8.9–10.3)
CO2: 25 mmol/L (ref 22–32)
Chloride: 109 mmol/L (ref 101–111)
Creatinine, Ser: 1.19 mg/dL (ref 0.61–1.24)
GFR calc Af Amer: >60 mL/min (ref >60)
GFR calc non Af Amer: >60 mL/min (ref >60)
Glucose, Bld: 106 mg/dL — ABNORMAL HIGH (ref 65–99)
Potassium: 5.3 mmol/L — ABNORMAL HIGH (ref 3.5–5.1)
Sodium: 141 mmol/L (ref 135–145)

## 2018-03-29 LAB — I-STAT TROPONIN, ED: Troponin i, poc: 1.4 ng/mL (ref 0.00–0.08)

## 2018-03-29 LAB — CBC
HEMATOCRIT: 43.2 % (ref 39.0–52.0)
HEMOGLOBIN: 14.5 g/dL (ref 13.0–17.0)
MCH: 29.6 pg (ref 26.0–34.0)
MCHC: 33.6 g/dL (ref 30.0–36.0)
MCV: 88.2 fL (ref 78.0–100.0)
Platelets: 209 10*3/uL (ref 150–400)
RBC: 4.9 MIL/uL (ref 4.22–5.81)
RDW: 13.5 % (ref 11.5–15.5)
WBC: 6 10*3/uL (ref 4.0–10.5)

## 2018-03-29 LAB — TROPONIN I
TROPONIN I: 1.43 ng/mL — AB (ref <0.03)
TROPONIN I: 1.26 ng/mL — AB (ref <0.03)

## 2018-03-29 LAB — PROTIME-INR
INR: 1.1
Prothrombin Time: 14.1 seconds (ref 11.4–15.2)

## 2018-03-29 LAB — APTT: aPTT: 30 seconds (ref 24–36)

## 2018-03-29 LAB — BRAIN NATRIURETIC PEPTIDE: B NATRIURETIC PEPTIDE 5: 286.2 pg/mL — AB (ref 0.0–100.0)

## 2018-03-29 IMAGING — CR DG CHEST 2V
2 series · 2 of 2 positions shown · non-contrast
Comparison: [DATE] chest radiograph

CLINICAL DATA: 66 y/o M; mid chest pain with shortness of breath
and burning down the left arm.

EXAM:
CHEST - 2 VIEW

[chest pa]
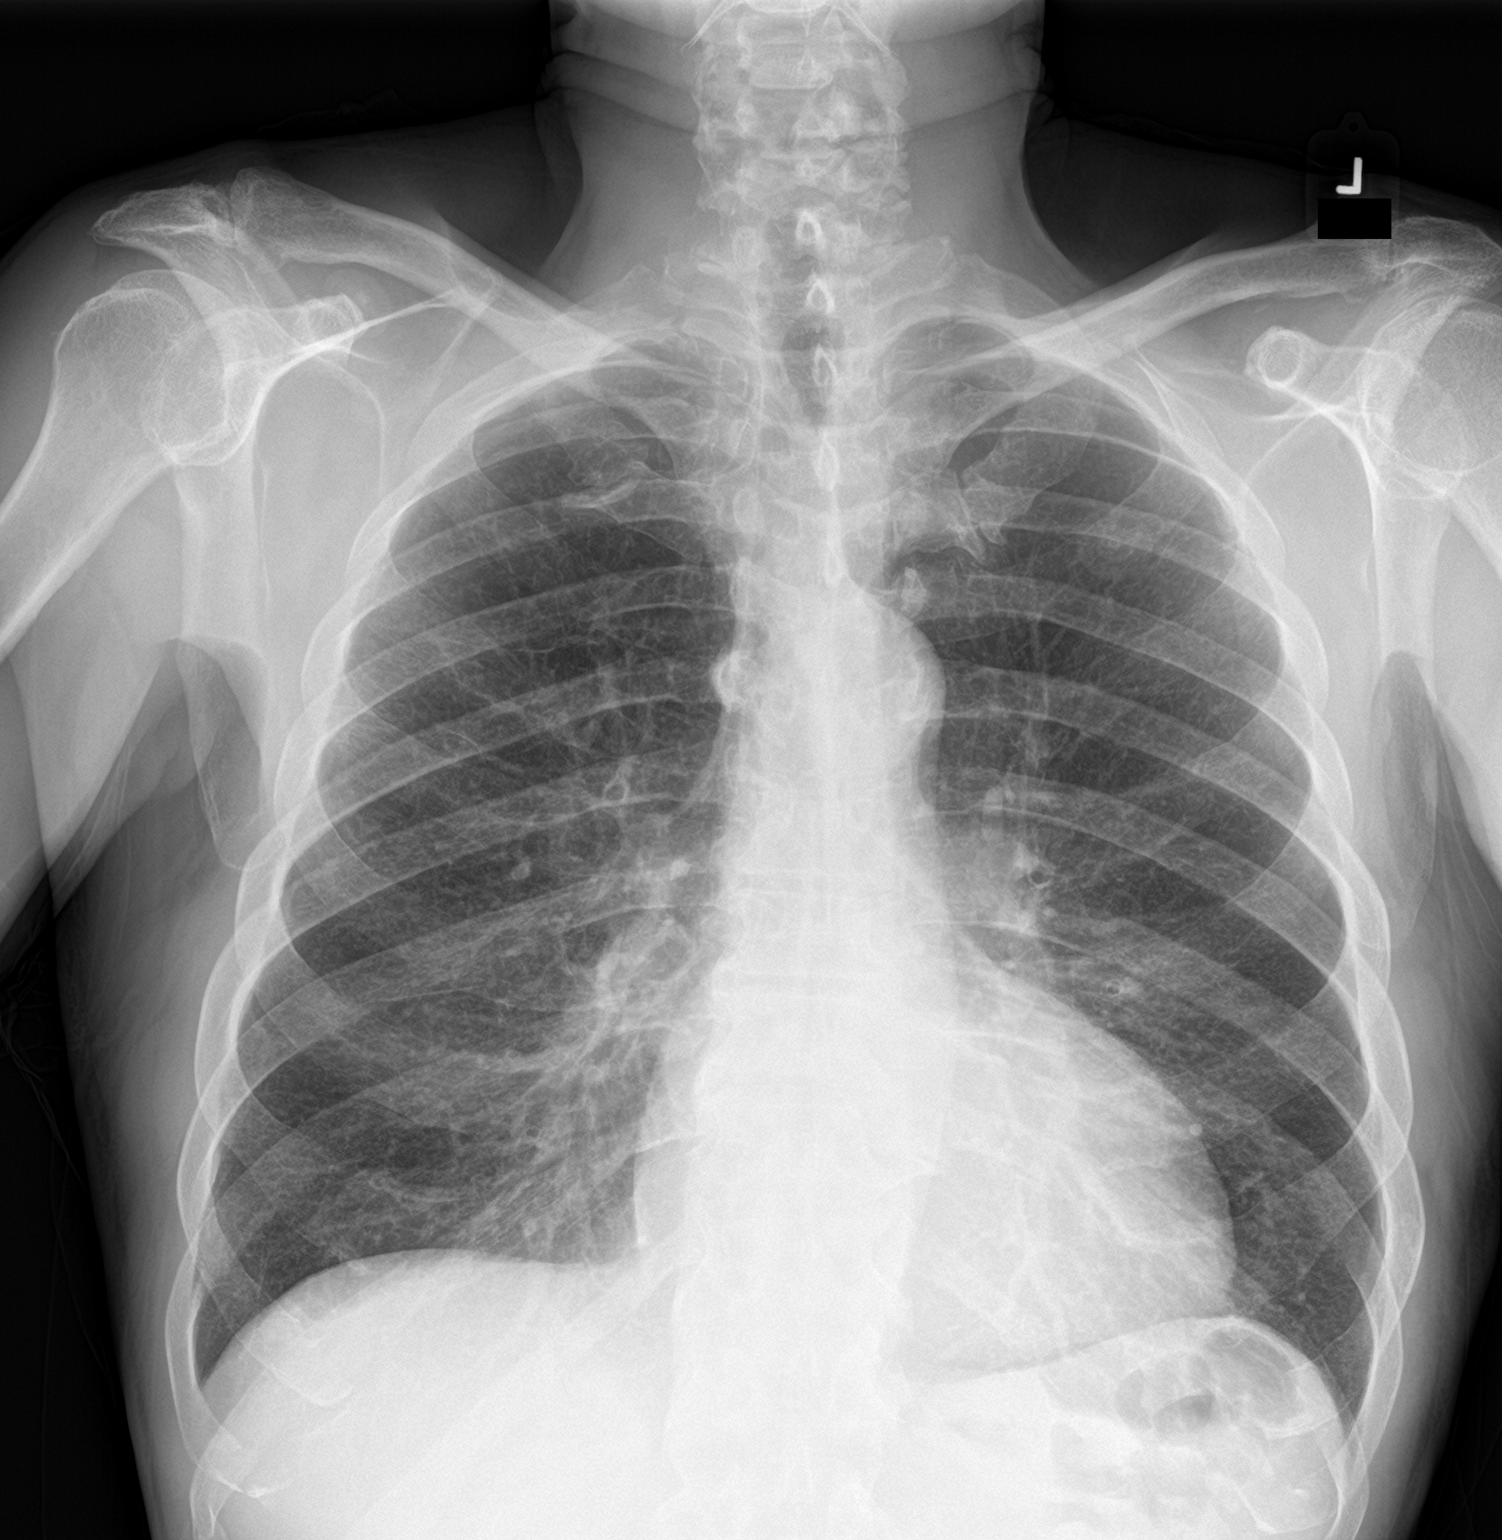

[chest lat]
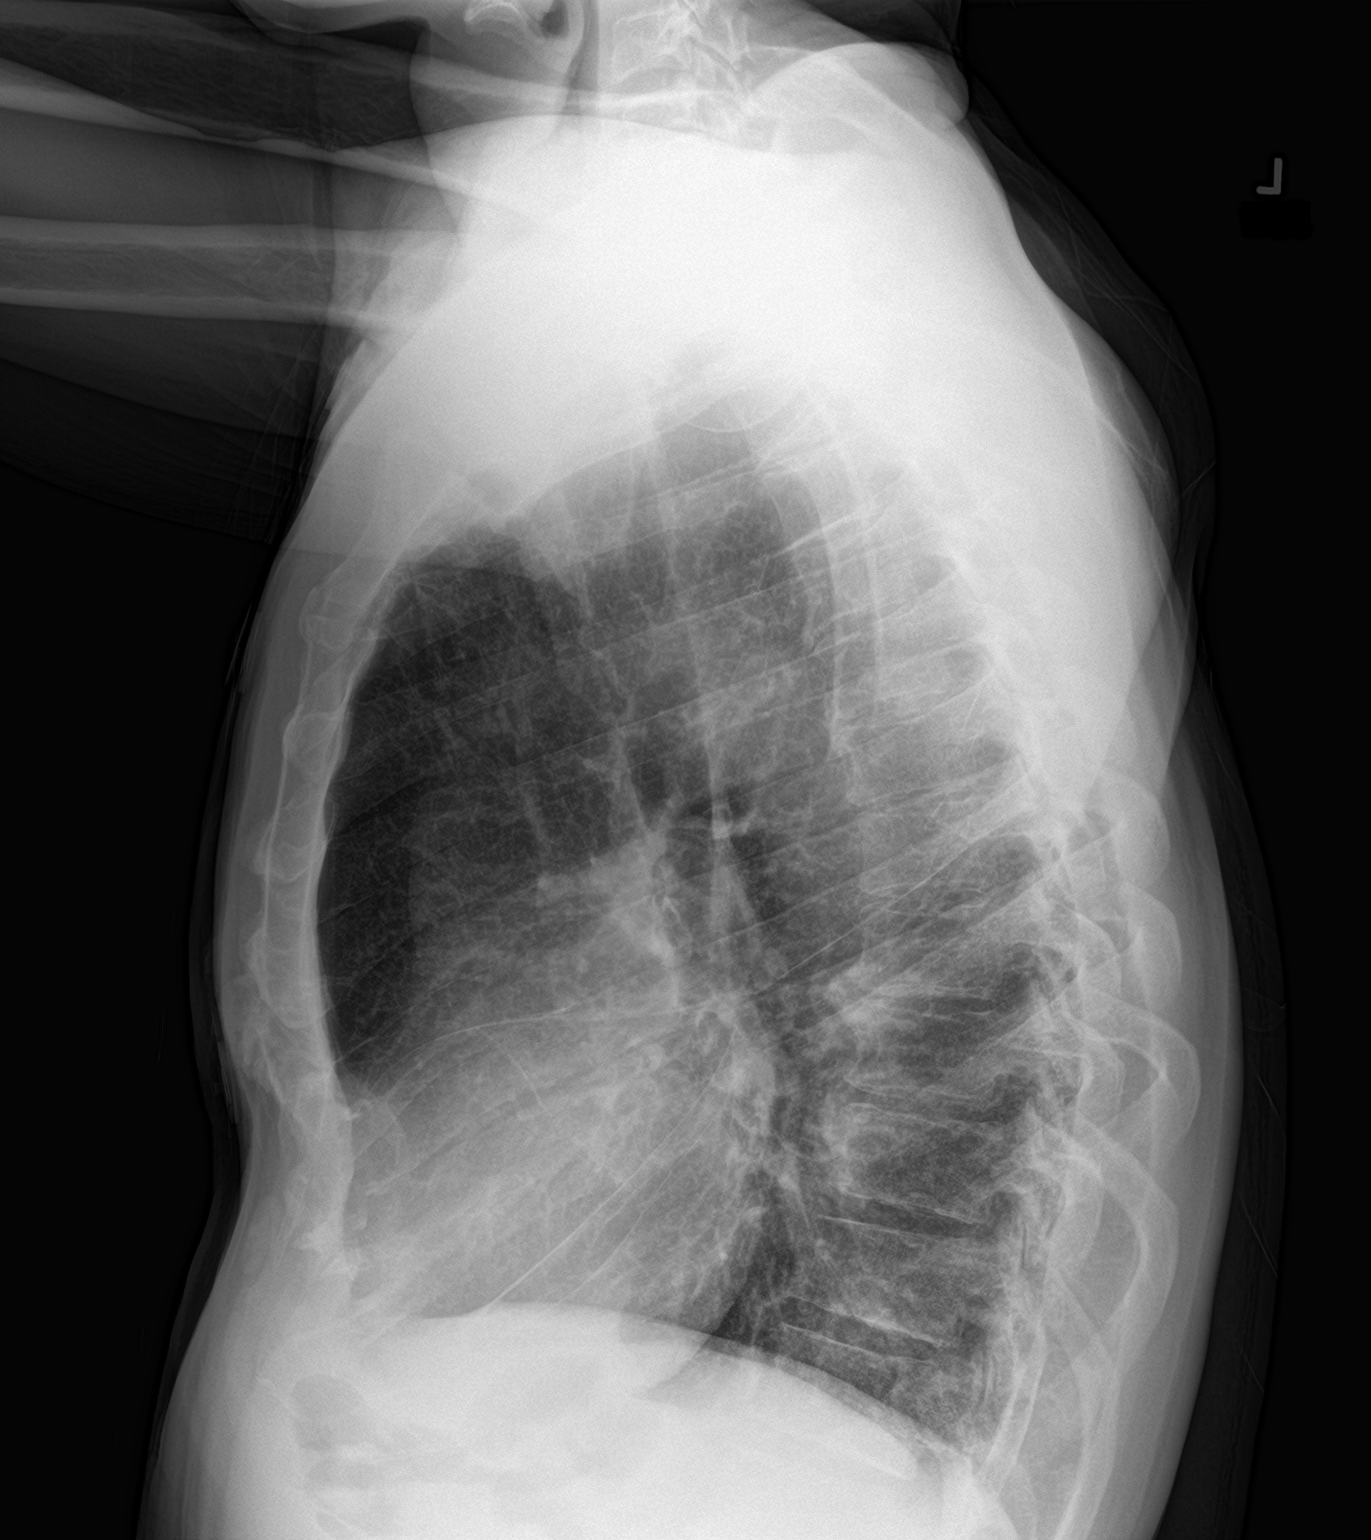

[2 of 2 positions shown; findings below may reference images not displayed]

FINDINGS: Stable borderline enlarged cardiac silhouette. Aortic
atherosclerosis with calcification. Clear lungs. No pleural effusion
or pneumothorax. No acute osseous abnormality is evident.
IMPRESSION: No acute pulmonary process identified. Stable borderline
cardiomegaly. Aortic atherosclerosis.

By: CHERUKURI M.D.

## 2018-03-29 MED ORDER — ATORVASTATIN CALCIUM 80 MG PO TABS
80.0000 mg | ORAL_TABLET | Freq: Every day | ORAL | Status: DC
  Administered 2018-03-30: 80 mg via ORAL
  Filled 2018-03-29: qty 1

## 2018-03-29 MED ORDER — PNEUMOCOCCAL VAC POLYVALENT 25 MCG/0.5ML IJ INJ
0.5000 mL | INJECTION | INTRAMUSCULAR | Status: DC
  Filled 2018-03-29: qty 0.5

## 2018-03-29 MED ORDER — HEPARIN (PORCINE) IN NACL 100-0.45 UNIT/ML-% IJ SOLN
1100.0000 [IU]/h | INTRAMUSCULAR | Status: DC
  Administered 2018-03-29: 900 [IU]/h via INTRAVENOUS
  Filled 2018-03-29: qty 250

## 2018-03-29 MED ORDER — NITROGLYCERIN 0.4 MG SL SUBL: 0.4000 mg | SUBLINGUAL_TABLET | SUBLINGUAL | Status: DC | PRN

## 2018-03-29 MED ORDER — ACETAMINOPHEN 325 MG PO TABS: 650.0000 mg | ORAL_TABLET | ORAL | Status: DC | PRN

## 2018-03-29 MED ORDER — ONDANSETRON HCL 4 MG/2ML IJ SOLN: 4.0000 mg | Freq: Four times a day (QID) | INTRAMUSCULAR | Status: DC | PRN

## 2018-03-29 MED ORDER — SODIUM CHLORIDE 0.9 % IV SOLN
INTRAVENOUS | Status: DC
  Administered 2018-03-29 – 2018-03-30 (×2): via INTRAVENOUS

## 2018-03-29 MED ORDER — HEPARIN (PORCINE) IN NACL 100-0.45 UNIT/ML-% IJ SOLN: 12.0000 [IU]/kg/h | INTRAMUSCULAR | Status: DC

## 2018-03-29 MED ORDER — SODIUM CHLORIDE 0.9% FLUSH: 3.0000 mL | Freq: Two times a day (BID) | INTRAVENOUS | Status: DC

## 2018-03-29 MED ORDER — ASPIRIN 81 MG PO CHEW
324.0000 mg | CHEWABLE_TABLET | Freq: Once | ORAL | Status: AC
  Administered 2018-03-29: 324 mg via ORAL
  Filled 2018-03-29: qty 4

## 2018-03-29 MED ORDER — ASPIRIN 81 MG PO CHEW
81.0000 mg | CHEWABLE_TABLET | ORAL | Status: AC
  Administered 2018-03-30: 81 mg via ORAL
  Filled 2018-03-29: qty 1

## 2018-03-29 MED ORDER — SODIUM CHLORIDE 0.9 % IV SOLN: INTRAVENOUS | Status: DC

## 2018-03-29 MED ORDER — ASPIRIN EC 81 MG PO TBEC
81.0000 mg | DELAYED_RELEASE_TABLET | Freq: Every day | ORAL | Status: DC
  Administered 2018-03-30 – 2018-03-31 (×2): 81 mg via ORAL
  Filled 2018-03-29 (×2): qty 1

## 2018-03-29 MED ORDER — SODIUM CHLORIDE 0.9% FLUSH: 3.0000 mL | INTRAVENOUS | Status: DC | PRN

## 2018-03-29 MED ORDER — SODIUM CHLORIDE 0.9 % IV SOLN: 250.0000 mL | INTRAVENOUS | Status: DC | PRN

## 2018-03-29 MED ORDER — HEPARIN SODIUM (PORCINE) 5000 UNIT/ML IJ SOLN
4000.0000 [IU] | Freq: Once | INTRAMUSCULAR | Status: AC
  Administered 2018-03-29: 4000 [IU] via INTRAVENOUS

## 2018-03-29 NOTE — ED Notes (Signed)
Trop I sent down at 2010 and currently in process.

## 2018-03-29 NOTE — ED Triage Notes (Signed)
Pt c/o Chest pain, states "I think im going to go to the ER instead". Pt left the building and went to the ER

## 2018-03-29 NOTE — ED Triage Notes (Signed)
Patient complains of burning in the left side of his chest that started last night. States he thought that it was heart burn, but today burning sensation and transitioned into soreness in his chest.

## 2018-03-29 NOTE — ED Notes (Signed)
Informed triage nurse Claiborne Billings of I-stat troponin 1.40

## 2018-03-29 NOTE — ED Provider Notes (Signed)
[facility] EMERGENCY DEPARTMENT Provider Note   CSN: [id number] Arrival date & time: 03/29/18  1446     History   Chief Complaint Chief Complaint  Patient presents with  . Chest Pain    HPI Juan [facility] is a 67 y.o. male.  HPI  J[facility] is a 67 y.o. male presents to emergency department complaining of chest pain.  Patient states his chest pain began yesterday.  That he has had some burning in the left arm and wrist as well.  He attributed to acid reflux.  He states this morning he continued to have pain in his arm and chest which did not improve throughout the day so he came to emergency department.  He denies any shortness of breath.  He denies any dizziness or lightheadedness.  No nausea or vomiting.  He did not take any medications prior to coming in.  He states he did go to [facility] to pick up some acid reflux medications but states they were too expensive.  Patient has had cardiac cath in 2013, did not have any stents done, was told they were normal.  He denies following up with primary care doctor or cardiologist since then.  Patient goes to [place] but states he is not happy with her care.  He is a smoker but states he cut down recently.  He is unsure of his cholesterol.  He does not take any medications daily.   Past Medical History:  Diagnosis Date  . Anginal pain ([place])   . Hypertension   . Myocardial infarction [facility])     Patient Active Problem List   Diagnosis Date Noted  . Chest pain 04/07/2012  . History of heart attack 04/07/2012    Past Surgical History:  Procedure Laterality Date  . CARDIAC CATHETERIZATION    . KNEE SURGERY          Home Medications    Prior to Admission medications   Medication Sig Start Date End Date Taking? Authorizing Provider  ciprofloxacin (CIPRO) 500 MG tablet Take 1 tablet (500 mg total) by mouth 2 (two) times daily. 01/04/14   Linton Flemings, MD  nitroGLYCERIN (NITROSTAT) 0.4 MG SL tablet Place 1 tablet (0.4 mg total)  under the tongue every 5 (five) minutes x 3 doses as needed for chest pain. 04/08/12 01/04/14  Jerline Pain, MD    Family History No family history on file.  Social History Social History   Tobacco Use  . Smoking status: Current Some Day Smoker    Packs/day: 0.50    Years: 40.00    Pack years: 20.00  . Smokeless tobacco: Never Used  Substance Use Topics  . Alcohol use: No  . Drug use: No     Allergies   Patient has no known allergies.   Review of Systems Review of Systems  Constitutional: Negative for chills and fever.  Respiratory: Negative for cough, chest tightness and shortness of breath.   Cardiovascular: Positive for chest pain. Negative for palpitations and leg swelling.  Gastrointestinal: Negative for abdominal distention, abdominal pain, diarrhea, nausea and vomiting.  Genitourinary: Negative for dysuria, frequency, hematuria and urgency.  Musculoskeletal: Negative for arthralgias, myalgias, neck pain and neck stiffness.  Skin: Negative for rash.  Allergic/Immunologic: Negative for immunocompromised state.  Neurological: Negative for dizziness, weakness, light-headedness, numbness and headaches.  All other systems reviewed and are negative.    Physical Exam Updated Vital Signs BP 136/61 (BP Location: Left Arm)   Pulse 78  Temp 98 F (36.7 C) (Oral)   Resp 16   SpO2 97%   Physical Exam  Constitutional: He appears well-developed and well-nourished. No distress.  HENT:  Head: Normocephalic and atraumatic.  Eyes: Conjunctivae are normal.  Neck: Neck supple.  Cardiovascular: Normal rate, regular rhythm and normal heart sounds.  Pulmonary/Chest: Effort normal. No respiratory distress. He has no wheezes. He has no rales. He exhibits no tenderness.  Abdominal: Soft. Bowel sounds are normal. He exhibits no distension. There is no tenderness. There is no rebound.  Musculoskeletal: He exhibits no edema.  Neurological: He is alert.  Skin: Skin is warm and  dry.  Nursing note and vitals reviewed.    ED Treatments / Results  Labs (all labs ordered are listed, but only abnormal results are displayed) Labs Reviewed  BASIC METABOLIC PANEL - Abnormal; Notable for the following components:      Result Value   Potassium 5.3 (*)    Glucose, Bld 106 (*)    All other components within normal limits  I-STAT TROPONIN, ED - Abnormal; Notable for the following components:   Troponin i, poc 1.40 (*)    All other components within normal limits  CBC  PROTIME-INR  BRAIN NATRIURETIC PEPTIDE  APTT  RAPID URINE DRUG SCREEN, HOSP PERFORMED    EKG EKG Interpretation  Date/Time:  Tuesday March 29 2018 15:00:47 EDT Ventricular Rate:  77 PR Interval:  180 QRS Duration: 146 QT Interval:  444 QTC Calculation: 502 R Axis:   -172 Text Interpretation:  Normal sinus rhythm Possible Left atrial enlargement Left bundle branch block Lateral infarct , age undetermined Abnormal ECG Confirmed by Pattricia Boss [phone]) on 03/29/2018 4:01:30 PM   Radiology Dg Chest 2 View  Result Date: 03/29/2018 CLINICAL DATA:  68 y/o M; mid chest pain with shortness of breath and burning down the left arm. EXAM: CHEST - 2 VIEW COMPARISON:  12/08/2012 chest radiograph FINDINGS: Stable borderline enlarged cardiac silhouette. Aortic atherosclerosis with calcification. Clear lungs. No pleural effusion or pneumothorax. No acute osseous abnormality is evident. IMPRESSION: No acute pulmonary process identified. Stable borderline cardiomegaly. Aortic atherosclerosis. Electronically Signed   By: Kristine Garbe M.D.   On: 03/29/2018 15:59    Procedures Procedures (including critical care time)  CRITICAL CARE Performed by: Nabiha Planck Total critical care time: 30 minutes Critical care time was exclusive of separately billable procedures and treating other patients. Critical care was necessary to treat or prevent imminent or life-threatening deterioration. Critical  care was time spent personally by me on the following activities: development of treatment plan with patient and/or surrogate as well as nursing, discussions with consultants, evaluation of patient's response to treatment, examination of patient, obtaining history from patient or surrogate, ordering and performing treatments and interventions, ordering and review of laboratory studies, ordering and review of radiographic studies, pulse oximetry and re-evaluation of patient's condition.  Medications Ordered in ED Medications  aspirin chewable tablet 324 mg (has no administration in time range)  0.9 %  sodium chloride infusion (has no administration in time range)  heparin injection 60 Units/kg (has no administration in time range)  heparin ADULT infusion 100 units/mL (25000 units/221mL sodium chloride 0.45%) (has no administration in time range)  nitroGLYCERIN (NITROSTAT) SL tablet 0.4 mg (has no administration in time range)     Initial Impression / Assessment and Plan / ED Course  I have reviewed the triage vital signs and the nursing notes.  Pertinent labs & imaging results that were available during my  care of the patient were reviewed by me and considered in my medical decision making (see chart for details).     4:18 PM Trop 1.4, active CP. Pt seen and examined. Pt with chest pain and burning since yesterday. Pain in left arm. Pt in NAD. VS normal. Peripheral pulses equal. No concern for dissection or PE at this time. No SOB. Concerning for ACS. Cardiac cath in 04/14/12, showed no sig obstructive coronary disease. Had proximal LAD catherter induced spasm. Pt did have anterior wall hypokinesis with EF 50%. Pt currently not on any meds. Does not follow up with pcp. Smoker. Cholesterol unknown. Will call cardiology   4:32 PM Spoke with cardiology, will come see pt.   6:46 PM Patient has still not been seen by cardiology.  I re-paged them. 2nd troponin ordered.   Pt seen by cardiology,  will admit.   Vitals:   03/29/18 2030 03/29/18 2145 03/29/18 2215 03/29/18 2247  BP: (!) 126/56 (!) 121/56 (!) 135/53 (!) 131/57  Pulse: (!) 57 61 70 65  Resp: 16 17 15 16   Temp:    (!) 97.5 F (36.4 C)  TempSrc:    Oral  SpO2: (!) 88% 92% 90% 99%  Weight:    70.3 kg (155 lb)  Height:    5\' 5"  (1.651 m)     Final Clinical Impressions(s) / ED Diagnoses   Final diagnoses:  NSTEMI (non-ST elevated myocardial infarction) [facility])    ED Discharge Orders    None       Jeannett Senior, PA-C 03/30/18 0022    Pattricia Boss, MD 03/31/18 [phone]

## 2018-03-29 NOTE — Progress Notes (Signed)
[facility] for heparin Indication: chest pain/ACS  Heparin Dosing Weight: 76.2 kg  Labs: Recent Labs    03/29/18 1518  HGB 14.5  HCT 43.2  PLT 209  CREATININE 1.19    Assessment: 66 yom presenting with CP. Pharmacy consulted to dose heparin for ACS. Not on anticoagulation PTA. CBC wnl, troponin 1.4. No bleed documented. Estimated weight in the ED 168 lbs per patient.  Goal of Therapy:  Heparin level 0.3-0.7 units/ml Monitor platelets by anticoagulation protocol: Yes   Plan:  Heparin 4000 unit bolus Start heparin at 900 units/h 6h heparin level Daily heparin level/CBC Monitor s/sx bleeding  Elicia Lamp, PharmD, BCPS Clinical Pharmacist 03/29/2018 4:18 PM

## 2018-03-29 NOTE — H&P (Addendum)
Cardiology Admission History and Physical:   Patient ID: Juan [facility]; MRN: [id number]; DOB: [birth year]   Admission date: 03/29/2018  Primary Care Provider: Default, Provider, MD Primary Cardiologist: New (Seen by Dr. Tamala Julian in 2013)  Chief Complaint:  Chest Pain   Patient Profile:   Juan Norris is a 67 y.o. male with a history of angiographically normal coronaries in 2013 with catheter induced spasm of LAD during cath, as well as h/o tobacco use, HTN and family h/o heart disease presenting to the [facility] with chest pain and elevated troponin.   History of Present Illness:   Juan [facility] underwent LHC in 2013 for chest pain and abnormal NST. Cath was done by Dr. Tamala Julian and showed angiographically normal coronaries and catheter induced spasm of LAD during cath, which improved with intracoronary nitroglycerine. He also has a long ongoing h/o tobacco abuse. He has been smoking for 40+ years. He reports his brother has h/o coronary stents and has a PPM.   Pt reports new SSCP and left arm pain off and on since 2 days ago. Intermittent, but progressively worsening. Feels like indigestion/ burning in chest and arm. Somewhat worsened by exertion. He also felt diaphoretic earlier today.   In [facility], EKG shows NSR with LBBB (old). Troponin is abnormal at 1.4. IV heparin has been started by pharmacy. BP is normotensive. SCr 1.19. He is currently CP free on IV heparin.   Past Medical History:  Diagnosis Date  . Anginal pain ([place])   . Hypertension   . Myocardial infarction [facility])     Past Surgical History:  Procedure Laterality Date  . CARDIAC CATHETERIZATION    . KNEE SURGERY       Medications Prior to Admission: Prior to Admission medications   Medication Sig Start Date End Date Taking? Authorizing Provider  Multiple Vitamin (MULTIVITAMIN WITH MINERALS) TABS tablet Take 1 tablet by mouth daily.   Yes [provider]  ciprofloxacin (CIPRO) 500 MG tablet Take 1 tablet (500 mg total) by mouth 2  (two) times daily. Patient not taking: Reported on 03/29/2018 01/04/14   Linton Flemings, MD  nitroGLYCERIN (NITROSTAT) 0.4 MG SL tablet Place 1 tablet (0.4 mg total) under the tongue every 5 (five) minutes x 3 doses as needed for chest pain. Patient not taking: Reported on 03/29/2018 04/08/12 01/04/14  Jerline Pain, MD     Allergies:   No Known Allergies  Social History:   Social History   Socioeconomic History  . Marital status: Married    Spouse name: Not on file  . Number of children: Not on file  . Years of education: Not on file  . Highest education level: Not on file  Occupational History  . Not on file  Social Needs  . Financial resource strain: Not on file  . Food insecurity:    Worry: Not on file    Inability: Not on file  . Transportation needs:    Medical: Not on file    Non-medical: Not on file  Tobacco Use  . Smoking status: Current Some Day Smoker    Packs/day: 0.50    Years: 40.00    Pack years: 20.00  . Smokeless tobacco: Never Used  Substance and Sexual Activity  . Alcohol use: No  . Drug use: No  . Sexual activity: Not on file  Lifestyle  . Physical activity:    Days per week: Not on file    Minutes per session: Not on file  . Stress: Not on  file  Relationships  . Social connections:    Talks on phone: Not on file    Gets together: Not on file    Attends religious service: Not on file    Active member of club or organization: Not on file    Attends meetings of clubs or organizations: Not on file    Relationship status: Not on file  . Intimate partner violence:    Fear of current or ex partner: Not on file    Emotionally abused: Not on file    Physically abused: Not on file    Forced sexual activity: Not on file  Other Topics Concern  . Not on file  Social History Narrative  . Not on file    Family History:   [name] family history includes CAD in his brother.    ROS:  Please see the history of present illness.  All other ROS reviewed  and negative.     Physical Exam/Data:   Vitals:   03/29/18 1815 03/29/18 1830 03/29/18 1900 03/29/18 1930  BP: 127/64 (!) 124/58 (!) 123/59 129/66  Pulse: (!) 58 (!) 57 (!) 56 61  Resp: 16 19 13  (!) 23  Temp:      TempSrc:      SpO2: 91% 90% 91% 97%  Weight:      Height:       No intake or output data in the 24 hours ending 03/29/18 1938 Filed Weights   03/29/18 1600  Weight: 168 lb (76.2 kg)   Body mass index is 27.13 kg/m.  General:  Well nourished, well developed, in no acute distress HEENT: normal Lymph: no adenopathy Neck: no JVD Endocrine:  No thryomegaly Vascular: No carotid bruits; FA pulses 2+ bilaterally without bruits  Cardiac:  normal S1, S2; RRR; no murmur  Lungs:  clear to auscultation bilaterally, no wheezing, rhonchi or rales  Abd: soft, nontender, no hepatomegaly  Ext: no edema Musculoskeletal:  No deformities, BUE and BLE strength normal and equal Skin: warm and dry  Neuro:  CNs 2-12 intact, no focal abnormalities noted Psych:  Normal affect    EKG:  The ECG that was done 03/29/18 was personally reviewed and demonstrates NSR with LBBB (old)  Relevant CV Studies: [facility] 04/14/12  ANGIOGRAPHIC DATA:   The left main coronary artery is short but widely patent.  The left anterior descending artery is dominant 3 diagonal branches arise from the LAD. Systolic compression of the apical LAD is noted. Irregularities are noted in the mid LAD. Proximal LAD narrowing up to 50% completely resolved after administration of 200 mcg of intracoronary nitroglycerin..  The left circumflex artery is free lateral wall branches arise from the circumflex in the form of obtuse marginals. Irregularities are noted in the mid circumflex. No significant obstruction is seen..  The right coronary artery is codominant with proximal tortuosity but no significant obstruction.Marland Kitchen  LEFT VENTRICULOGRAM:  Left ventricular angiogram was done in the 30 RAO projection and revealed abnormal  left ventricular wall motion with mid to distal anteroapical hypokinesis and estimated ejection fraction of 50 %. \  IMPRESSIONS:  1. No significant obstructive coronary disease is noted. Proximal LAD catheter induced spasm was noted but relieved with intracoronary nitroglycerin.  2. Distal anterior wall hypokinesis with ejection fraction of 50%.  3. The patient does have a wall motion abnormality. Etiology is uncertain but could represent prior injury from transient LAD a diagonal occlusion. Possible etiologies would include remote episode of intense coronary spasm or plaque  rupture with thrombosis (and spontaneous recanalization).  2D Echo 03/2012 Study Conclusions  - Left ventricle: The cavity size was mildly dilated. Systolic function was mildly to moderately reduced. The estimated ejection fraction was in the range of 40% to 45%. There is akinesis of the mid to distal anteroseptal myocardium. Doppler parameters are consistent with abnormal left ventricular relaxation (grade 1 diastolic dysfunction). Doppler parameters are consistent with elevated mean left atrial filling pressure. - Mitral valve: Mild regurgitation. - Left atrium: The atrium was mildly dilated.  Laboratory Data:  Chemistry Recent Labs  Lab 03/29/18 1518  NA 141  K 5.3*  CL 109  CO2 25  GLUCOSE 106*  BUN 15  CREATININE 1.19  CALCIUM 9.4  GFRNONAA >60  GFRAA >60  ANIONGAP 7    No results for input(s): PROT, ALBUMIN, AST, ALT, ALKPHOS, BILITOT in the last 168 hours. Hematology Recent Labs  Lab 03/29/18 1518  WBC 6.0  RBC 4.90  HGB 14.5  HCT 43.2  MCV 88.2  MCH 29.6  MCHC 33.6  RDW 13.5  PLT 209   Cardiac Enzymes Recent Labs  Lab 03/29/18 1620  TROPONINI 1.43*    Recent Labs  Lab 03/29/18 1523  TROPIPOC 1.40*    BNP Recent Labs  Lab 03/29/18 1620  BNP 286.2*    DDimer No results for input(s): DDIMER in the last 168 hours.  Radiology/Studies:  Dg Chest 2  View  Result Date: 03/29/2018 CLINICAL DATA:  67 y/o M; mid chest pain with shortness of breath and burning down the left arm. EXAM: CHEST - 2 VIEW COMPARISON:  12/08/2012 chest radiograph FINDINGS: Stable borderline enlarged cardiac silhouette. Aortic atherosclerosis with calcification. Clear lungs. No pleural effusion or pneumothorax. No acute osseous abnormality is evident. IMPRESSION: No acute pulmonary process identified. Stable borderline cardiomegaly. Aortic atherosclerosis. Electronically Signed   By: Kristine Garbe M.D.   On: 03/29/2018 15:59    Assessment and Plan:   1. NSTEMI: classic symptoms. SSCP and left arm pain w/ elevated troponin at 1.4. EKG shows NSR with LBBB (old). BP stable. HR borderline brady in the low 60s. He is currently CP free on IV heparin. Will continue and plan for [facility] +/- PCI tomorrow. Will add high dose statin and check FLP in the AM. Add ASA 81 mg. Will hold off on BB given low baseline HR. Will continue to cycle troponin's to assess trend. Order 2D echo. NPO at midnight. Smoking cessation advised.  Severity of Illness: The appropriate patient status for this patient is INPATIENT. Inpatient status is judged to be reasonable and necessary in order to provide the required intensity of service to ensure the patient's safety. [name] presenting symptoms, physical exam findings, and initial radiographic and laboratory data in the context of their chronic comorbidities is felt to place them at high risk for further clinical deterioration. Furthermore, it is not anticipated that the patient will be medically stable for discharge from the hospital within 2 midnights of admission. The following factors support the patient status of inpatient.   " [name] presenting symptoms include Unstable angina. " The worrisome physical exam findings include abnormal EKG. " The initial radiographic and laboratory data are worrisome because of elevated troponin. " The  chronic co-morbidities include HTN and chronic tobacco abuse.   * I certify that at the point of admission it is my clinical judgment that the patient will require inpatient hospital care spanning beyond 2 midnights from the point of admission due to high intensity of  service, high risk for further deterioration and high frequency of surveillance required.*    For questions or updates, please contact [place] Please consult [email or web address] for contact info under Cardiology/STEMI.    Signed, Lyda Jester, PA-C  03/29/2018 7:38 PM   The patient was seen and examined, and I agree with the history, physical exam, assessment and plan as documented above, with modifications as noted below. I have also personally reviewed all relevant documentation, old records, labs, and both radiographic and cardiovascular studies (cath and echo above). I have also independently interpreted old and new ECG's.  67 yr old male with aforementioned history who presented to [facility] with chest pain.   He ate part of an apple turnover last night at 7 pm and began experiencing left forearm pain, tingling, and numbness followed by chest pain one hour later. He felt his pulse and "it was racing". Symptoms subsided by 11 pm and he fell asleep. He went to work this morning and while walking uphill, he began experiencing similar symptoms accompanied by shortness of breath and diaphoresis.  He thought symptoms were related to GERD. He went home, drank some orange juice, and asked a neighbor to take him to the pharmacy.  While in the pharmacy, symptoms got worse and they directed him to the [facility].  He has smoked 0.5 ppd x 47 years. One brother has coronary stents, another brother has a pacemaker, and another brother has an AICD.  Physical exam was unremarkable.  ECG which I personally reviewed demonstrates sinus rhythm with a LBBB (old, seen on 12/08/2012) and high lateral Q waves and ST depression in V6 (both  of which are new findings).  Initial serum troponin elevated at 1.43. BNP mildly elevated at 286. CBC and renal function are normal.  Chest xray without acute pulmonary process and shows stable borderline cardiomegaly.  He is currently symptom free and is on IV heparin.  HR in low 60 bpm range.  Will start ASA and high intensity statin therapy. Will arrange for an echocardiogram tomorrow to assess cardiac structure and function. Will hold off on institution of beta blockers given HR in low 60 bpm range.  As presentation is consistent with a NSTEMI, will arrange for left heart catheterization and coronary angiography tomorrow.  Management strategy discussed with patient and with his only daughter, Juan [facility], by phone.   Kate Sable, MD, [facility]  03/29/2018 7:56 PM

## 2018-03-29 NOTE — ED Notes (Signed)
Report given to [facility]

## 2018-03-29 NOTE — ED Notes (Signed)
Cardiology at bedside.

## 2018-03-30 ENCOUNTER — Encounter (HOSPITAL_COMMUNITY): Payer: Self-pay | Admitting: *Deleted

## 2018-03-30 ENCOUNTER — Inpatient Hospital Stay (HOSPITAL_COMMUNITY)
Admission: EM | Disposition: A | Discharge status: Home / Self Care | Payer: Self-pay | Source: Home / Self Care | Attending: Cardiology

## 2018-03-30 ENCOUNTER — Inpatient Hospital Stay (HOSPITAL_COMMUNITY): Payer: Medicare Other

## 2018-03-30 DIAGNOSIS — R778 Other specified abnormalities of plasma proteins: Secondary | ICD-10-CM

## 2018-03-30 DIAGNOSIS — I361 Nonrheumatic tricuspid (valve) insufficiency: Secondary | ICD-10-CM

## 2018-03-30 DIAGNOSIS — I10 Essential (primary) hypertension: Secondary | ICD-10-CM

## 2018-03-30 DIAGNOSIS — I2510 Atherosclerotic heart disease of native coronary artery without angina pectoris: Secondary | ICD-10-CM

## 2018-03-30 DIAGNOSIS — R001 Bradycardia, unspecified: Secondary | ICD-10-CM

## 2018-03-30 DIAGNOSIS — I428 Other cardiomyopathies: Secondary | ICD-10-CM

## 2018-03-30 DIAGNOSIS — R7989 Other specified abnormal findings of blood chemistry: Secondary | ICD-10-CM

## 2018-03-30 DIAGNOSIS — R748 Abnormal levels of other serum enzymes: Secondary | ICD-10-CM

## 2018-03-30 HISTORY — PX: LEFT HEART CATH AND CORONARY ANGIOGRAPHY: CATH118249

## 2018-03-30 LAB — TROPONIN I
TROPONIN I: 0.91 ng/mL — AB (ref <0.03)
Troponin I: 0.82 ng/mL (ref <0.03)

## 2018-03-30 LAB — BASIC METABOLIC PANEL
ANION GAP: 7 (ref 5–15)
BUN: 13 mg/dL (ref 6–20)
CALCIUM: 8.3 mg/dL — AB (ref 8.9–10.3)
CHLORIDE: 113 mmol/L — AB (ref 101–111)
CO2: 21 mmol/L — AB (ref 22–32)
Creatinine, Ser: 0.9 mg/dL (ref 0.61–1.24)
GFR calc non Af Amer: >60 mL/min (ref >60)
Glucose, Bld: 93 mg/dL (ref 65–99)
Potassium: 4.4 mmol/L (ref 3.5–5.1)
Sodium: 141 mmol/L (ref 135–145)

## 2018-03-30 LAB — CBC
HCT: 38.3 % — ABNORMAL LOW (ref 39.0–52.0)
HEMATOCRIT: 37.3 % — AB (ref 39.0–52.0)
HEMOGLOBIN: 12.5 g/dL — AB (ref 13.0–17.0)
HEMOGLOBIN: 12.8 g/dL — AB (ref 13.0–17.0)
MCH: 29.6 pg (ref 26.0–34.0)
MCH: 29.4 pg (ref 26.0–34.0)
MCHC: 33.5 g/dL (ref 30.0–36.0)
MCHC: 33.4 g/dL (ref 30.0–36.0)
MCV: 88 fL (ref 78.0–100.0)
MCV: 88.2 fL (ref 78.0–100.0)
Platelets: 191 10*3/uL (ref 150–400)
Platelets: 195 10*3/uL (ref 150–400)
RBC: 4.23 MIL/uL (ref 4.22–5.81)
RBC: 4.35 MIL/uL (ref 4.22–5.81)
RDW: 13.4 % (ref 11.5–15.5)
RDW: 13.7 % (ref 11.5–15.5)
WBC: 4.6 10*3/uL (ref 4.0–10.5)
WBC: 4.1 10*3/uL (ref 4.0–10.5)

## 2018-03-30 LAB — HEPARIN LEVEL (UNFRACTIONATED)
HEPARIN UNFRACTIONATED: 0.46 [IU]/mL (ref 0.30–0.70)
Heparin Unfractionated: 0.23 IU/mL — ABNORMAL LOW (ref 0.30–0.70)

## 2018-03-30 LAB — LIPID PANEL
Cholesterol: 114 mg/dL (ref 0–200)
HDL: 24 mg/dL — AB (ref >40)
LDL Cholesterol: 77 mg/dL (ref 0–99)
Total CHOL/HDL Ratio: 4.8 RATIO
Triglycerides: 65 mg/dL (ref <150)
VLDL: 13 mg/dL (ref 0–40)

## 2018-03-30 LAB — ECHOCARDIOGRAM COMPLETE
HEIGHTINCHES: 65 in
WEIGHTICAEL: 2444.46 [oz_av]

## 2018-03-30 LAB — RAPID URINE DRUG SCREEN, HOSP PERFORMED
AMPHETAMINES: NOT DETECTED
Barbiturates: NOT DETECTED
Benzodiazepines: NOT DETECTED
Cocaine: NOT DETECTED
OPIATES: NOT DETECTED
TETRAHYDROCANNABINOL: NOT DETECTED

## 2018-03-30 LAB — PROTIME-INR
INR: 1.14
PROTHROMBIN TIME: 14.5 s (ref 11.4–15.2)

## 2018-03-30 SURGERY — LEFT HEART CATH AND CORONARY ANGIOGRAPHY
Anesthesia: LOCAL

## 2018-03-30 MED ORDER — SODIUM CHLORIDE 0.9 % IV SOLN: 250.0000 mL | INTRAVENOUS | Status: DC | PRN

## 2018-03-30 MED ORDER — SODIUM CHLORIDE 0.9% FLUSH
3.0000 mL | Freq: Two times a day (BID) | INTRAVENOUS | Status: DC
  Administered 2018-03-30 – 2018-03-31 (×2): 3 mL via INTRAVENOUS

## 2018-03-30 MED ORDER — ACETAMINOPHEN 325 MG PO TABS: 650.0000 mg | ORAL_TABLET | ORAL | Status: DC | PRN

## 2018-03-30 MED ORDER — SODIUM CHLORIDE 0.9% FLUSH: 3.0000 mL | INTRAVENOUS | Status: DC | PRN

## 2018-03-30 MED ORDER — HEPARIN BOLUS VIA INFUSION
2000.0000 [IU] | Freq: Once | INTRAVENOUS | Status: AC
  Administered 2018-03-30: 2000 [IU] via INTRAVENOUS
  Filled 2018-03-30: qty 2000

## 2018-03-30 MED ORDER — SODIUM CHLORIDE 0.9 % IV SOLN
INTRAVENOUS | Status: AC | PRN
  Administered 2018-03-30: 125 mL/h via INTRAVENOUS

## 2018-03-30 MED ORDER — ONDANSETRON HCL 4 MG/2ML IJ SOLN: 4.0000 mg | Freq: Four times a day (QID) | INTRAMUSCULAR | Status: DC | PRN

## 2018-03-30 MED ORDER — HEPARIN SODIUM (PORCINE) 1000 UNIT/ML IJ SOLN
INTRAMUSCULAR | Status: AC
  Filled 2018-03-30: qty 1

## 2018-03-30 MED ORDER — HEPARIN (PORCINE) IN NACL 2-0.9 UNIT/ML-% IJ SOLN
INTRAMUSCULAR | Status: DC | PRN
  Administered 2018-03-30: 10 mL via INTRA_ARTERIAL

## 2018-03-30 MED ORDER — IOHEXOL 350 MG/ML SOLN
INTRAVENOUS | Status: DC | PRN
  Administered 2018-03-30: 55 mL

## 2018-03-30 MED ORDER — HEPARIN (PORCINE) IN NACL 2-0.9 UNIT/ML-% IJ SOLN
INTRAMUSCULAR | Status: AC | PRN
  Administered 2018-03-30 (×2): 500 mL

## 2018-03-30 MED ORDER — HEPARIN SODIUM (PORCINE) 1000 UNIT/ML IJ SOLN
INTRAMUSCULAR | Status: DC | PRN
  Administered 2018-03-30: 3500 [IU] via INTRAVENOUS

## 2018-03-30 MED ORDER — LIDOCAINE HCL (PF) 1 % IJ SOLN
INTRAMUSCULAR | Status: AC
  Filled 2018-03-30: qty 30

## 2018-03-30 MED ORDER — LIDOCAINE HCL (PF) 1 % IJ SOLN
INTRAMUSCULAR | Status: DC | PRN
  Administered 2018-03-30: 2 mL

## 2018-03-30 MED ORDER — VERAPAMIL HCL 2.5 MG/ML IV SOLN
INTRAVENOUS | Status: AC
  Filled 2018-03-30: qty 2

## 2018-03-30 MED ORDER — NITROGLYCERIN 1 MG/10 ML FOR IR/CATH LAB
INTRA_ARTERIAL | Status: AC
  Filled 2018-03-30: qty 10

## 2018-03-30 MED ORDER — SODIUM CHLORIDE 0.9 % IV SOLN
INTRAVENOUS | Status: AC
  Administered 2018-03-30: 11:00:00 via INTRAVENOUS

## 2018-03-30 MED ORDER — LISINOPRIL 5 MG PO TABS
5.0000 mg | ORAL_TABLET | Freq: Every day | ORAL | Status: DC
Start: 2018-03-30 — End: 2018-03-31
  Administered 2018-03-30 – 2018-03-31 (×2): 5 mg via ORAL
  Filled 2018-03-30 (×2): qty 1

## 2018-03-30 SURGICAL SUPPLY — 13 items
BAND CMPR LRG ZPHR (HEMOSTASIS) ×1
BAND ZEPHYR COMPRESS 30 LONG (HEMOSTASIS) ×1 IMPLANT
CATH INFINITI 5 FR JL3.5 (CATHETERS) ×1 IMPLANT
CATH INFINITI 5FR MULTPACK ANG (CATHETERS) ×1 IMPLANT
GUIDEWIRE INQWIRE 1.5J.035X260 (WIRE) IMPLANT
INQWIRE 1.5J .035X260CM (WIRE) ×2
KIT HEART LEFT (KITS) ×2 IMPLANT
NDL PERC 21GX4CM (NEEDLE) IMPLANT
NEEDLE PERC 21GX4CM (NEEDLE) ×2 IMPLANT
PACK CARDIAC CATHETERIZATION (CUSTOM PROCEDURE TRAY) ×2 IMPLANT
SHEATH RAIN RADIAL 21G 6FR (SHEATH) ×1 IMPLANT
TRANSDUCER W/STOPCOCK (MISCELLANEOUS) ×2 IMPLANT
TUBING CIL FLEX 10 FLL-RA (TUBING) ×2 IMPLANT

## 2018-03-30 NOTE — Progress Notes (Addendum)
Progress Note  Patient Name: Juan Norris Date of Encounter: 03/30/2018  Primary Cardiologist: New (seen by Dr. Tamala Julian in 2013)  Subjective   Pt feeling well this AM. Only has chest discomfort with chest palpation. Trop levels are elevated. Otherwise, he has no complaints this AM.   Inpatient Medications    Scheduled Meds: . aspirin EC  81 mg Oral Daily  . atorvastatin  80 mg Oral q1800  . pneumococcal 23 valent vaccine  0.5 mL Intramuscular Tomorrow-1000  . sodium chloride flush  3 mL Intravenous Q12H   Continuous Infusions: . sodium chloride 125 mL/hr at 03/30/18 0108  . sodium chloride    . sodium chloride Stopped (03/30/18 0532)  . heparin 1,100 Units/hr (03/30/18 0123)   PRN Meds: sodium chloride, acetaminophen, nitroGLYCERIN, ondansetron (ZOFRAN) IV, sodium chloride flush   Vital Signs    Vitals:   03/29/18 2145 03/29/18 2215 03/29/18 2247 03/30/18 0554  BP: (!) 121/56 (!) 135/53 (!) 131/57 110/62  Pulse: 61 70 65 (!) 57  Resp: 17 15 16 19   Temp:   (!) 97.5 F (36.4 C) 97.6 F (36.4 C)  TempSrc:   Oral Oral  SpO2: 92% 90% 99% 98%  Weight:   155 lb (70.3 kg) 152 lb 12.5 oz (69.3 kg)  Height:   5\' 5"  (1.651 m)     Intake/Output Summary (Last 24 hours) at 03/30/2018 0802 Last data filed at 03/30/2018 0612 Gross per 24 hour  Intake 1215.23 ml  Output 1370 ml  Net -154.77 ml   Filed Weights   03/29/18 1600 03/29/18 2247 03/30/18 0554  Weight: 168 lb (76.2 kg) 155 lb (70.3 kg) 152 lb 12.5 oz (69.3 kg)   Physical Exam   General: Well developed, well nourished, NAD Skin: Warm, dry, intact  Head: Normocephalic, atraumatic, clear, moist mucus membranes. Neck: Negative for carotid bruits. No JVD Lungs:Clear to ausculation bilaterally. No wheezes, rales, or rhonchi. Breathing is unlabored. Cardiovascular: RRR with S1 S2. No murmurs, rubs, or gallops Abdomen: Soft, non-tender, non-distended with normoactive bowel sounds. No obvious abdominal masses. MSK:  Strength and tone appear normal for age. 5/5 in all extremities Extremities: No edema. No clubbing or cyanosis. DP/PT pulses 2+ bilaterally Neuro: Alert and oriented. No focal deficits. No facial asymmetry. MAE spontaneously. Psych: Responds to questions appropriately with normal affect.    Labs    Chemistry Recent Labs  Lab 03/29/18 1518  NA 141  K 5.3*  CL 109  CO2 25  GLUCOSE 106*  BUN 15  CREATININE 1.19  CALCIUM 9.4  GFRNONAA >60  GFRAA >60  ANIONGAP 7     Hematology Recent Labs  Lab 03/29/18 1518 03/30/18 0015  WBC 6.0 4.1  RBC 4.90 4.23  HGB 14.5 12.5*  HCT 43.2 37.3*  MCV 88.2 88.2  MCH 29.6 29.6  MCHC 33.6 33.5  RDW 13.5 13.7  PLT 209 191    Cardiac Enzymes Recent Labs  Lab 03/29/18 1620 03/29/18 2002 03/30/18 0015  TROPONINI 1.43* 1.26* 0.91*    Recent Labs  Lab 03/29/18 1523  TROPIPOC 1.40*     BNP Recent Labs  Lab 03/29/18 1620  BNP 286.2*     DDimer No results for input(s): DDIMER in the last 168 hours.   Radiology    Dg Chest 2 View  Result Date: 03/29/2018 CLINICAL DATA:  67 y/o M; mid chest pain with shortness of breath and burning down the left arm. EXAM: CHEST - 2 VIEW COMPARISON:  12/08/2012  chest radiograph FINDINGS: Stable borderline enlarged cardiac silhouette. Aortic atherosclerosis with calcification. Clear lungs. No pleural effusion or pneumothorax. No acute osseous abnormality is evident. IMPRESSION: No acute pulmonary process identified. Stable borderline cardiomegaly. Aortic atherosclerosis. Electronically Signed   By: Kristine Garbe M.D.   On: 03/29/2018 15:59    Telemetry    03/30/18 NSR with 1st degree AV block and HR 57 - Personally Reviewed  ECG    No new tracing as of 03/30/18 - Personally Reviewed  Cardiac Studies   LHC 04/14/12  ANGIOGRAPHIC DATA:The left main coronary artery is short but widely patent.  The left anterior descending artery is dominant 3 diagonal branches arise from the  LAD. Systolic compression of the apical LAD is noted. Irregularities are noted in the mid LAD. Proximal LAD narrowing up to 50% completely resolved after administration of 200 mcg of intracoronary nitroglycerin..  The left circumflex artery is free lateral wall branches arise from the circumflex in the form of obtuse marginals. Irregularities are noted in the mid circumflex. No significant obstruction is seen..  The right coronary artery is codominant with proximal tortuosity but no significant obstruction.Marland Kitchen  LEFT VENTRICULOGRAM:Left ventricular angiogram was done in the 30 RAO projection and revealed abnormal left ventricular wall motion with mid to distal anteroapical hypokinesis and estimated ejection fraction of 50 %. \  IMPRESSIONS:1.No significant obstructive coronary disease is noted. Proximal LAD catheter induced spasm was noted but relieved with intracoronary nitroglycerin.  2. Distal anterior wall hypokinesis with ejection fraction of 50%.  3. The patient does have a wall motion abnormality. Etiology is uncertain but could represent prior injury from transient LAD a diagonal occlusion. Possible etiologies would include remote episode of intense coronary spasm or plaque rupture with thrombosis (and spontaneous recanalization).  2D Echo 03/2012 Study Conclusions  - Left ventricle: The cavity size was mildly dilated. Systolic function was mildly to moderately reduced. The estimated ejection fraction was in the range of 40% to 45%. There is akinesis of the mid to distal anteroseptal myocardium. Doppler parameters are consistent with abnormal left ventricular relaxation (grade 1 diastolic dysfunction). Doppler parameters are consistent with elevated mean left atrial filling pressure. - Mitral valve: Mild regurgitation. - Left atrium: The atrium was mildly dilated.  Patient Profile     67 y.o. male with a history of angiographically normal coronaries in  2013 with catheter induced spasm of LAD during cath, as well as h/o tobacco use, HTN and family h/o heart disease presenting to the ED with chest pain and elevated troponin.   Assessment & Plan    1. 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 Tobacco abuse: -Smoking cessation strongly encouraged  3. HTN: -Stable, 105/53>110/62>131/57 -No need for BP lowering agents at this time -BB on hold secondary to low HR on presentation     Signed, Kathyrn Drown NP-C [place] Pager: [phone] 03/30/2018, 8:02 AM    For questions or updates, please contact   Please consult [email or web address] for contact info under Cardiology/STEMI.   The  patient was seen, examined and discussed with Kathyrn Drown, NP  and I agree with the above.   Cath showed mild non-obstructive CAD, echo bedside dilated LV, with at least moderate LVH, ? Non-compaction, severe LV dysfunction, ? Non-compaction. On physical exam not fluid overloaded.  He is rather hypotensive and bradycardic, I will start lisinopril 5 mg po daily. BP too low to start Entresto.  Ena Dawley, MD 03/30/2018

## 2018-03-30 NOTE — Progress Notes (Signed)
ANTICOAGULATION CONSULT NOTE   Pharmacy Consult for Heparin Indication: chest pain/ACS  No Known Allergies  Patient Measurements: Height: 5\' 5"  (165.1 cm) Weight: 155 lb (70.3 kg) IBW/kg (Calculated) : 61.5  Vital Signs: Temp: 97.5 F (36.4 C) (04/16 2247) Temp Source: Oral (04/16 2247) BP: 131/57 (04/16 2247) Pulse Rate: 65 (04/16 2247)  Labs: Recent Labs    03/29/18 1518 03/29/18 1620 03/29/18 2002 03/30/18 0006 03/30/18 0015  HGB 14.5  --   --   --  12.5*  HCT 43.2  --   --   --  37.3*  PLT 209  --   --   --  191  APTT  --  30  --   --   --   LABPROT  --  14.1  --   --   --   INR  --  1.10  --   --   --   HEPARINUNFRC  --   --   --  0.23*  --   CREATININE 1.19  --   --   --   --   TROPONINI  --  1.43* 1.26*  --   --     Estimated Creatinine Clearance: 53.1 mL/min (by C-G formula based on SCr of 1.19 mg/dL).  Assessment: 67 y.o. male with chest pain for heparin  Goal of Therapy:  Heparin level 0.3-0.7 units/ml Monitor platelets by anticoagulation protocol: Yes   Plan:  Heparin 2000 units IV bolus, then increase heparin 1100 units/hr Follow up after cath today   Caryl Pina 03/30/2018,1:18 AM

## 2018-03-30 NOTE — Progress Notes (Signed)
  Echocardiogram 2D Echocardiogram has been performed.  Juan Norris 03/30/2018, 12:01 PM

## 2018-03-30 NOTE — Interval H&P Note (Signed)
History and Physical Interval Note:  03/30/2018 9:30 AM  Juan Norris  has presented today for cardiac cath with the diagnosis of NSTEMI  The various methods of treatment have been discussed with the patient and family. After consideration of risks, benefits and other options for treatment, the patient has consented to  Procedure(s): LEFT HEART CATH AND CORONARY ANGIOGRAPHY (N/A) as a surgical intervention .  [name] history has been reviewed, patient examined, no change in status, stable for surgery.  I have reviewed the patient's chart and labs.  Questions were answered to the patient's satisfaction.    Cath Lab Visit (complete for each Cath Lab visit)  Clinical Evaluation Leading to the Procedure:   ACS: Yes.    Non-ACS:    Anginal Classification: CCS IV  Anti-ischemic medical therapy: No Therapy  Non-Invasive Test Results: No non-invasive testing performed  Prior CABG: No previous CABG        Lauree Chandler

## 2018-03-30 NOTE — CV Procedure (Signed)
2D echo attempted but cath lab in route to pick up patient. Try echo later.

## 2018-03-31 ENCOUNTER — Encounter (HOSPITAL_COMMUNITY): Payer: Self-pay | Admitting: Cardiology

## 2018-03-31 DIAGNOSIS — N289 Disorder of kidney and ureter, unspecified: Secondary | ICD-10-CM

## 2018-03-31 DIAGNOSIS — Z720 Tobacco use: Secondary | ICD-10-CM

## 2018-03-31 DIAGNOSIS — I5041 Acute combined systolic (congestive) and diastolic (congestive) heart failure: Secondary | ICD-10-CM

## 2018-03-31 LAB — BASIC METABOLIC PANEL
Anion gap: 8 (ref 5–15)
BUN: 10 mg/dL (ref 6–20)
CHLORIDE: 111 mmol/L (ref 101–111)
CO2: 21 mmol/L — ABNORMAL LOW (ref 22–32)
Calcium: 8.5 mg/dL — ABNORMAL LOW (ref 8.9–10.3)
Creatinine, Ser: 1.02 mg/dL (ref 0.61–1.24)
Glucose, Bld: 108 mg/dL — ABNORMAL HIGH (ref 65–99)
POTASSIUM: 4 mmol/L (ref 3.5–5.1)
SODIUM: 140 mmol/L (ref 135–145)

## 2018-03-31 LAB — CBC
HCT: 38.9 % — ABNORMAL LOW (ref 39.0–52.0)
Hemoglobin: 13.1 g/dL (ref 13.0–17.0)
MCH: 29.8 pg (ref 26.0–34.0)
MCHC: 33.7 g/dL (ref 30.0–36.0)
MCV: 88.4 fL (ref 78.0–100.0)
Platelets: 210 10*3/uL (ref 150–400)
RBC: 4.4 MIL/uL (ref 4.22–5.81)
RDW: 13.8 % (ref 11.5–15.5)
WBC: 6.3 10*3/uL (ref 4.0–10.5)

## 2018-03-31 MED ORDER — ASPIRIN 81 MG PO TBEC: 81.0000 mg | DELAYED_RELEASE_TABLET | Freq: Every day | ORAL | Status: DC

## 2018-03-31 MED ORDER — LISINOPRIL 5 MG PO TABS: 5.0000 mg | ORAL_TABLET | Freq: Every day | ORAL | 30 refills | Status: DC

## 2018-03-31 MED ORDER — ATORVASTATIN CALCIUM 80 MG PO TABS: 80.0000 mg | ORAL_TABLET | Freq: Every day | ORAL | 3 refills | Status: DC

## 2018-03-31 MED FILL — Nitroglycerin IV Soln 100 MCG/ML in D5W: INTRA_ARTERIAL | Qty: 10 | Status: AC

## 2018-03-31 NOTE — Discharge Summary (Signed)
.     Discharge Summary    Patient ID: Juan Norris,  MRN: [id number], DOB/AGE: 1951/06/14 67 y.o.  Admit date: 03/29/2018 Discharge date: 03/31/2018  Primary Care Provider: Default, Provider Primary Cardiologist: Dr. Meda Coffee   Discharge Diagnoses    Active Problems:   NSTEMI (non-ST elevated myocardial infarction) [facility])   Elevated troponin   Tobacco use   Acute combined systolic and diastolic heart failure (HCC)   Acute renal insufficiency  Allergies No Known Allergies  Diagnostic Studies/Procedures    Cath 03/30/18:  Ost 2nd Mrg lesion is 40% stenosed.  Prox Cx to Mid Cx lesion is 20% stenosed.  Prox LAD lesion is 10% stenosed.   1. Mild non-obstructive CAD. His presentation could be due to coronary vasospasm.  2. Normal LV filling pressure.   Recommendations: Medical management of mild CAD. Consider addition of a long acting nitrate or calcium channel blocker for possible spasm. Echo is planned today to assess LV function.  _____________   Echo 03/30/18: Study Conclusions  - Left ventricle: The cavity size was mildly dilated. Wall   thickness was increased in a pattern of mild LVH. Systolic   function was severely reduced. The estimated ejection fraction   was in the range of 20% to 25%. Diffuse hypokinesis. Features are   consistent with a pseudonormal left ventricular filling pattern,   with concomitant abnormal relaxation and increased filling   pressure (grade 2 diastolic dysfunction). - Aortic valve: Trileaflet; moderately thickened, moderately   calcified leaflets. There was mild regurgitation. - Mitral valve: Mobility was restricted. The findings are   consistent with mild stenosis. There was mild regurgitation. Mean   gradient (D): 6 mm Hg. Valve area by pressure half-time: 2.29   cm^2. Valve area by continuity equation (using LVOT flow): 1.28   cm^2. - Tricuspid valve: There was mild regurgitation.  History of Present Illness     Juan Norris is  a 67 y.o. male with a history of angiographically normal coronaries in 2013 with catheter induced spasm of LAD during cath, as well as h/o tobacco use, HTN and family h/o heart disease presenting to the ED with chest pain and elevated troponin.  Juan Norris underwent LHC in 2013 for chest pain and abnormal NST. Cath was done by Dr. Tamala Julian and showed angiographically normal coronaries and catheter induced spasm of LAD during cath, which improved with intracoronary nitroglycerine. He also has a long ongoing h/o tobacco abuse. He has been smoking for 40+ years. He reports his brother has h/o coronary stents and has a [place] [facility] Course   On 03/29/18 the pt reported experiencing new SSCP and left arm pain off and on for the last two days. He states that the pain worsened with exertion and was associated with some diaphoresis, however he had no SOB, palpitations, dizziness, LE swelling, orthopnea or syncope. He states that it feels like indigestion/ burning in chest and arm.  In the ED, EKG shows NSR with LBBB (old). Troponin is abnormal at 1.4. IV heparin was started by pharmacy in anticipation of taking him to the cath lab for further evaluaiton. BP is normotensive and he was mildly bradycardic. SCr 1.19. He has been CP free since admission with no CP recurrence.   He proceeded to the cath lab on 03/30/18 which revealed mild non-obstructive CAD. It is thought that his presentation could be due to coronary vasospasm. Recommendations were made for medical management of mild CAD. Consider addition of a long acting  nitrate or calcium channel blocker for possible spasm when BP is able to tolerate. Echo completed on 03/30/18 showed LVEF of 20% to 25% with diffuse hypokinesis. This is decreased from previous study in 2013. He showed no s/s of fluid volume overload. He has been stable. Ambulated self with no CP. Right radial cath site is unremarkable. Follow up echocardiogram in 3 months, LVEF < 35% at that time  --> ICD referral  Assessment and Plan outlined below:   1. 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 Tobacco abuse: -Smoking cessation strongly encouraged  3. HTN: -Stable, 105/53>110/62>131/57 -No need for BP lowering agents at this time -BB on hold secondary to low HR on presentation  4. 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 Mild acute renal insufficiency: -Creatitine peak at 1.02 during stay -Will need to get BMET at follow up with the addition of ACE-I   Consultants: None   The patient has been seen and examined by Dr. Meda Coffee who feels that he is stable and ready fpr discharge today  _____________  Discharge Vitals Blood pressure (!) 110/45, pulse 60, temperature 97.8 F (36.6 C), temperature source Oral, resp. rate 18, height 5\' 5"  (1.651 m), weight 152 lb 12.5 oz (69.3 kg), SpO2 100 %.  Filed Weights   03/29/18 1600 03/29/18 2247 03/30/18 0554  Weight: 168 lb (76.2 kg) 155 lb (70.3 kg) 152 lb 12.5 oz (69.3 kg)    Labs & Radiologic Studies    CBC Recent Labs    03/30/18 0737 03/31/18 0410  WBC 4.6 6.3  HGB 12.8* 13.1  HCT 38.3* 38.9*  MCV 88.0 88.4  PLT 195 425   Basic Metabolic Panel Recent Labs    03/30/18 0737 03/31/18 0410  NA 141 140  K 4.4 4.0    CL 113* 111  CO2 21* 21*  GLUCOSE 93 108*  BUN 13 10  CREATININE 0.90 1.02  CALCIUM 8.3* 8.5*   Liver Function Tests No results for input(s): AST, ALT, ALKPHOS, BILITOT, PROT, ALBUMIN in the last 72 hours. No results for input(s): LIPASE, AMYLASE in the last 72 hours. Cardiac Enzymes Recent Labs    03/29/18 2002 03/30/18 0015 03/30/18 0737  TROPONINI 1.26* 0.91* 0.82*   BNP Invalid input(s): POCBNP D-Dimer No results for input(s): DDIMER in the last 72 hours. Hemoglobin A1C Recent Labs    03/29/18 1638  HGBA1C 5.7*   Fasting Lipid Panel Recent Labs    03/30/18 0006  CHOL 114  HDL 24*  LDLCALC 77  TRIG 65  CHOLHDL 4.8   Thyroid Function Tests No results for input(s): TSH, T4TOTAL, T3FREE, THYROIDAB in the last 72 hours.  Invalid input(s): FREET3 _____________  Dg Chest 2 View  Result Date: 03/29/2018 CLINICAL DATA:  67 y/o M; mid chest pain with shortness of breath and burning down the left  arm. EXAM: CHEST - 2 VIEW COMPARISON:  12/08/2012 chest radiograph FINDINGS: Stable borderline enlarged cardiac silhouette. Aortic atherosclerosis with calcification. Clear lungs. No pleural effusion or pneumothorax. No acute osseous abnormality is evident. IMPRESSION: No acute pulmonary process identified. Stable borderline cardiomegaly. Aortic atherosclerosis. Electronically Signed   By: Kristine Garbe M.D.   On: 03/29/2018 15:59   Disposition   Pt is being discharged home today in good condition.  Follow-up Plans & Appointments    Follow-up Information    Consuelo Pandy, PA-C Follow up on 04/18/2018.   Specialties:  Cardiology, Radiology Why:  Your follow up appointment will be on 04/18/18 at 0930 with Lyda Jester, PA with Dr. Meda Coffee. Please arrive by 0915am.  Contact information: [address] [place] [address] [phone]          Discharge Instructions    (HEART FAILURE PATIENTS) Call MD:  Anytime you have any of the  following symptoms: 1) 3 pound weight gain in 24 hours or 5 pounds in 1 week 2) shortness of breath, with or without a dry hacking cough 3) swelling in the hands, feet or stomach 4) if you have to sleep on extra pillows at night in order to breathe.   Complete by:  As directed    Call MD for:  difficulty breathing, headache or visual disturbances   Complete by:  As directed    Call MD for:  persistant dizziness or light-headedness   Complete by:  As directed    Call MD for:  persistant nausea and vomiting   Complete by:  As directed    Call MD for:  redness, tenderness, or signs of infection (pain, swelling, redness, odor or green/yellow discharge around incision site)   Complete by:  As directed    Call MD for:  severe uncontrolled pain   Complete by:  As directed    Call MD for:  temperature >100.4   Complete by:  As directed    Diet - low sodium heart healthy   Complete by:  As directed    Discharge instructions   Complete by:  As directed    No driving for 3 days. No lifting over 5 lbs for 1 week. No sexual activity for 1 week. You may return to work on 04/11/18. Keep procedure site clean & dry. If you notice increased pain, swelling, bleeding or pus, call/return!  You may shower, but no soaking baths/hot tubs/pools for 1 week.   Would advise to generally stick to lower sodium diet (aiming for maximum 2,000mg  per day) and staying hydrated but not to excess >2L per day of total fluid intake.  One of your heart tests showed weakness of the heart muscle this admission. This may make you more susceptible to weight gain from fluid retention, which can lead to symptoms that we call heart failure. Please follow these special instructions:  1. Follow a low-salt diet - you are allowed no more than 2,000mg  of sodium per day. Watch your fluid intake. In general, you should not be taking in more than 2 liters of fluid per day (no more than 8 glasses per day). This includes sources of water in foods  like soup, coffee, tea, milk, etc. 2. Weigh yourself on the same scale at same time of day and keep a log. 3. Call your doctor: (Anytime you feel any of the following symptoms)  - 3lb weight gain overnight or 5lb within a few days - Shortness of breath, with  or without a dry hacking cough  - Swelling in the hands, feet or stomach  - If you have to sleep on extra pillows at night in order to breathe   IT IS IMPORTANT TO LET YOUR DOCTOR KNOW EARLY ON IF YOU ARE HAVING SYMPTOMS SO WE CAN HELP YOU!   Increase activity slowly   Complete by:  As directed      Discharge Medications   Allergies as of 03/31/2018   No Known Allergies     Medication List    STOP taking these medications   ciprofloxacin 500 MG tablet Commonly known as:  CIPRO     TAKE these medications   aspirin 81 MG EC tablet Take 1 tablet (81 mg total) by mouth daily. Start taking on:  04/01/2018   atorvastatin 80 MG tablet Commonly known as:  LIPITOR Take 1 tablet (80 mg total) by mouth daily.   lisinopril 5 MG tablet Commonly known as:  PRINIVIL,ZESTRIL Take 1 tablet (5 mg total) by mouth daily. Start taking on:  04/01/2018   multivitamin with minerals Tabs tablet Take 1 tablet by mouth daily.   nitroGLYCERIN 0.4 MG SL tablet Commonly known as:  NITROSTAT Place 1 tablet (0.4 mg total) under the tongue every 5 (five) minutes x 3 doses as needed for chest pain.       Aspirin prescribed at discharge?  Yes High Intensity Statin Prescribed? (Lipitor 40-80mg  or Crestor 20-40mg ): Yes Beta Blocker Prescribed? No: BP not allow For EF <40%, was ACEI/ARB Prescribed? Yes ADP Receptor Inhibitor Prescribed? (i.e. Plavix etc.-Includes Medically Managed Patients): No: no intervention  For EF <40%, Aldosterone Inhibitor Prescribed? No: when BP allows  Was EF assessed during THIS hospitalization? Yes Was Cardiac Rehab II ordered? (Included Medically managed Patients): No: ambulated self with no CP   Outstanding  Labs/Studies   -Please arrange a BMET check for mildly elevated creatinine at discharge of 1.02 -Follow up echocardiogram in 3 months -Lipid panel and LFT's in 6 weeks   Duration of Discharge Encounter   Greater than 30 minutes including physician time.  SignedKathyrn Drown NP 03/31/2018, 12:20 PM

## 2018-03-31 NOTE — Progress Notes (Addendum)
Progress Note  Patient Name: Juan Norris Date of Encounter: 03/31/2018  Primary Cardiologist: Dr. Meda Coffee   Subjective   Pt feeling well today. Walked in the hall multiple times. Denies chest pain, SOB or palpitations this AM.  Inpatient Medications    Scheduled Meds: . aspirin EC  81 mg Oral Daily  . atorvastatin  80 mg Oral q1800  . lisinopril  5 mg Oral Daily  . pneumococcal 23 valent vaccine  0.5 mL Intramuscular Tomorrow-1000  . sodium chloride flush  3 mL Intravenous Q12H   Continuous Infusions: . sodium chloride 125 mL/hr at 03/30/18 0108  . sodium chloride     PRN Meds: sodium chloride, acetaminophen, acetaminophen, nitroGLYCERIN, ondansetron (ZOFRAN) IV, ondansetron (ZOFRAN) IV, sodium chloride flush   Vital Signs    Vitals:   03/30/18 1217 03/30/18 1640 03/30/18 2003 03/31/18 0638  BP: (!) 115/50 (!) 108/54 122/62 (!) 115/58  Pulse: 62 (!) 59 73 60  Resp: 16 18 18 18   Temp: 97.9 F (36.6 C) 98.8 F (37.1 C) 97.8 F (36.6 C)   TempSrc: Oral Oral Oral   SpO2: 97% 96% 96% 100%  Weight:      Height:        Intake/Output Summary (Last 24 hours) at 03/31/2018 0829 Last data filed at 03/31/2018 0600 Gross per 24 hour  Intake 1860 ml  Output 250 ml  Net 1610 ml   Filed Weights   03/29/18 1600 03/29/18 2247 03/30/18 0554  Weight: 168 lb (76.2 kg) 155 lb (70.3 kg) 152 lb 12.5 oz (69.3 kg)    Physical Exam   General: Well developed, well nourished, NAD Skin: Warm, dry, intact  Head: Normocephalic, atraumatic, clear, moist mucus membranes. Neck: Negative for carotid bruits. No JVD Lungs:Clear to ausculation bilaterally. No wheezes, rales, or rhonchi. Breathing is unlabored. Cardiovascular: RRR with S1 S2. No murmurs, rubs, or gallops Abdomen: Soft, non-tender, non-distended with normoactive bowel sounds. No obvious abdominal masses. MSK: Strength and tone appear normal for age. 5/5 in all extremities Extremities: No edema. No clubbing or cyanosis.  DP/PT pulses 2+ bilaterally Neuro: Alert and oriented. No focal deficits. No facial asymmetry. MAE spontaneously. Psych: Responds to questions appropriately with normal affect.    Labs    Chemistry Recent Labs  Lab 03/29/18 1518 03/30/18 0737 03/31/18 0410  NA 141 141 140  K 5.3* 4.4 4.0  CL 109 113* 111  CO2 25 21* 21*  GLUCOSE 106* 93 108*  BUN 15 13 10   CREATININE 1.19 0.90 1.02  CALCIUM 9.4 8.3* 8.5*  GFRNONAA >60 >60 >60  GFRAA >60 >60 >60  ANIONGAP 7 7 8      Hematology Recent Labs  Lab 03/30/18 0015 03/30/18 0737 03/31/18 0410  WBC 4.1 4.6 6.3  RBC 4.23 4.35 4.40  HGB 12.5* 12.8* 13.1  HCT 37.3* 38.3* 38.9*  MCV 88.2 88.0 88.4  MCH 29.6 29.4 29.8  MCHC 33.5 33.4 33.7  RDW 13.7 13.4 13.8  PLT 191 195 210    Cardiac Enzymes Recent Labs  Lab 03/29/18 1620 03/29/18 2002 03/30/18 0015 03/30/18 0737  TROPONINI 1.43* 1.26* 0.91* 0.82*    Recent Labs  Lab 03/29/18 1523  TROPIPOC 1.40*     BNP Recent Labs  Lab 03/29/18 1620  BNP 286.2*     DDimer No results for input(s): DDIMER in the last 168 hours.   Radiology    Dg Chest 2 View  Result Date: 03/29/2018 CLINICAL DATA:  67 y/o M; mid  chest pain with shortness of breath and burning down the left arm. EXAM: CHEST - 2 VIEW COMPARISON:  12/08/2012 chest radiograph FINDINGS: Stable borderline enlarged cardiac silhouette. Aortic atherosclerosis with calcification. Clear lungs. No pleural effusion or pneumothorax. No acute osseous abnormality is evident. IMPRESSION: No acute pulmonary process identified. Stable borderline cardiomegaly. Aortic atherosclerosis. Electronically Signed   By: Kristine Garbe M.D.   On: 03/29/2018 15:59    Telemetry    03/31/18 SB with HR in the 45-50 range  - Personally Reviewed  ECG    No new tracing as of 03/31/18 - Personally Reviewed  Cardiac Studies   Cath 03/30/18:   Colon Flattery 2nd Mrg lesion is 40% stenosed.  Prox Cx to Mid Cx lesion is 20%  stenosed.  Prox LAD lesion is 10% stenosed.   1. Mild non-obstructive CAD. His presentation could be due to coronary vasospasm.  2. Normal LV filling pressure.   Recommendations: Medical management of mild CAD. Consider addition of a long acting nitrate or calcium channel blocker for possible spasm. Echo is planned today to assess LV function.    Echo 03/30/18: Study Conclusions  - Left ventricle: The cavity size was mildly dilated. Wall   thickness was increased in a pattern of mild LVH. Systolic   function was severely reduced. The estimated ejection fraction   was in the range of 20% to 25%. Diffuse hypokinesis. Features are   consistent with a pseudonormal left ventricular filling pattern,   with concomitant abnormal relaxation and increased filling   pressure (grade 2 diastolic dysfunction). - Aortic valve: Trileaflet; moderately thickened, moderately   calcified leaflets. There was mild regurgitation. - Mitral valve: Mobility was restricted. The findings are   consistent with mild stenosis. There was mild regurgitation. Mean   gradient (D): 6 mm Hg. Valve area by pressure half-time: 2.29   cm^2. Valve area by continuity equation (using LVOT flow): 1.28   cm^2. - Tricuspid valve: There was mild regurgitation.   Patient Profile     67 y.o. male with a history of angiographically normal coronaries in 2013 with catheter induced spasm of LAD during cath, as well as h/otobacco use,HTN and family h/o heart diseasepresenting to the ED with chest pain and elevated troponin.  Assessment & Plan    1. 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  Features areconsistent with a pseudonormal left ventricular filling pattern, with concomitant abnormal relaxation and increased filling pressure (grade 2 diastolic dysfunction) -ASA 81, statin   2. Tobacco abuse: -Smoking cessation strongly encouraged  3. HTN: -Stable, 110/45>115/58>122/62 -No need for BP lowering agents at this time -BB on hold secondary to low HR on presentation   4. Chronic systolic and diastolic heart failure: -Per echo 03/30/18 with decreased EF to 20-25% with hypokinesis and grade II DD -No s/s of fluid volume overload -Will need to optimize medical therapy however will be cautious given his bradycardia   Signed, Kathyrn Drown NP-C [place] Pager: [phone] 03/31/2018, 8:29 AM  For questions or updates, please contact   Please consult [email or web address] for contact info under Cardiology/STEMI.  The patient was seen, examined and discussed with Kathyrn Drown, NP  and I agree with the above.   Cath showed mild non-obstructive CAD, echo bedside dilated LV, with at least moderate LVH, ? Non-compaction, severe LV dysfunction, ? Non-compaction. On physical exam not fluid overloaded.  He is rather hypotensive and bradycardic, I will start lisinopril 5 mg po daily. BP too low to start Entresto. We will discharge today, arrange for a follow up within 2 weeks, consider adding spironolactone 12.5 mg po daily at the next visit if the blood pressure allows it. Echo in 3 months, LVEF < 35% at that time --> ICD referral.   Ena Dawley, MD 03/31/2018

## 2018-04-18 ENCOUNTER — Encounter: Payer: Self-pay | Admitting: *Deleted

## 2018-04-18 ENCOUNTER — Encounter: Payer: Self-pay | Admitting: Cardiology

## 2018-04-18 ENCOUNTER — Ambulatory Visit (INDEPENDENT_AMBULATORY_CARE_PROVIDER_SITE_OTHER): Payer: Medicare Other | Admitting: Cardiology

## 2018-04-18 VITALS — BP 102/58 | HR 86 | Ht 65.0 in | Wt 148.8 lb

## 2018-04-18 DIAGNOSIS — I420 Dilated cardiomyopathy: Secondary | ICD-10-CM

## 2018-04-18 DIAGNOSIS — I428 Other cardiomyopathies: Secondary | ICD-10-CM | POA: Diagnosis not present

## 2018-04-18 DIAGNOSIS — I5022 Chronic systolic (congestive) heart failure: Secondary | ICD-10-CM

## 2018-04-18 MED ORDER — METOPROLOL SUCCINATE ER 25 MG PO TB24: 12.5000 mg | ORAL_TABLET | Freq: Every day | ORAL | 6 refills | Status: DC

## 2018-04-18 NOTE — Progress Notes (Signed)
04/18/2018 Juan Norris   [birth year]  [id number]  Primary Physician Default, Provider, MD Primary Cardiologist: Dr. Meda Coffee   Reason for Visit/CC: f/u for Nonischemic Cardiomyopathy  HPI:  Juan Norris is a 67 y.o. male who is being seen today for hospital follow-up as well as for management of nonischemic cardiomyopathy.  He has a history of tobacco abuse, hypertension and family history of coronary disease.  He recently presented to [facility] on March 29, 2018 with chest pain concerning for unstable angina.  He ruled in for non-STEMI with troponin peaking at 1.43.  He underwent cardiac catheterization on April 17 which showed mild nonobstructive CAD.  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  Medical therapy was recommended.  Echocardiogram was performed and revealed severely reduced left ventricular EF down to 20 to 25% with diffuse hypokinesis along with grade 2 diastolic dysfunction, making the diagnosis of nonischemic cardiomyopathy.  Medical management was recommended however limited due to hypotension.  Prior to discharge from the hospital, he was only able to tolerate low-dose ACE inhibitor.  He was placed on lisinopril 5 mg daily.  He presents to clinic today for post hospital follow-up.  He denies resting dyspnea, no weight gain, no lower extremity edema, orthopnea or PND.  However he has exertional dyspnea with mild to moderate activities.  He reports full medication compliance.  His blood pressure today is 102/58.  Pulse rates 86 bpm.  Radial cath site is stable.  He has not had any further chest pain. No palpitations, dizziness, syncope/ near syncope.   Cardiac Studies  Cath 03/30/18:   Colon Flattery 2nd Mrg lesion is 40% stenosed.  Prox Cx to Mid Cx lesion is 20% stenosed.  Prox LAD lesion is 10% stenosed.  1. Mild non-obstructive CAD. His presentation could be due to coronary vasospasm.  2. Normal LV filling pressure.   Recommendations: Medical management of mild  CAD. Consider addition of a long acting nitrate or calcium channel blocker for possible spasm. Echo is planned today to assess LV function.    Echo 03/30/18: Study Conclusions  - Left ventricle: The cavity size was mildly dilated. Wall thickness was increased in a pattern of mild LVH. Systolic function was severely reduced. The estimated ejection fraction was in the range of 20% to 25%. Diffuse hypokinesis. Features are consistent with a pseudonormal left ventricular filling pattern, with concomitant abnormal relaxation and increased filling pressure (grade 2 diastolic dysfunction). - Aortic valve: Trileaflet; moderately thickened, moderately calcified leaflets. There was mild regurgitation. - Mitral valve: Mobility was restricted. The findings are consistent with mild stenosis. There was mild regurgitation. Mean gradient (D): 6 mm Hg. Valve area by pressure half-time: 2.29 cm^2. Valve area by continuity equation (using LVOT flow): 1.28 cm^2. - Tricuspid valve: There was mild regurgitation.   Current Meds  Medication Sig  . aspirin EC 81 MG EC tablet Take 1 tablet (81 mg total) by mouth daily.  Marland Kitchen atorvastatin (LIPITOR) 80 MG tablet Take 1 tablet (80 mg total) by mouth daily.  Marland Kitchen lisinopril (PRINIVIL,ZESTRIL) 5 MG tablet Take 1 tablet (5 mg total) by mouth daily.  . Multiple Vitamin (MULTIVITAMIN WITH MINERALS) TABS tablet Take 1 tablet by mouth daily.   No Known Allergies Past Medical History:  Diagnosis Date  . Acute renal insufficiency   . Anginal pain ([place])   . Hypertension   . Myocardial infarction ([place])   . Systolic heart failure ([place])   . Tobacco use    Family History  Problem Relation Age of Onset  . CAD Brother    Past Surgical History:  Procedure Laterality Date  . CARDIAC CATHETERIZATION    . KNEE SURGERY    . LEFT HEART CATH AND CORONARY ANGIOGRAPHY N/A 03/30/2018   Procedure: LEFT HEART CATH AND CORONARY ANGIOGRAPHY;  Surgeon:  Burnell Blanks, MD;  Location: [facility];  Service: Cardiovascular;  Laterality: N/A;   Social History   Socioeconomic History  . Marital status: Married    Spouse name: Not on file  . Number of children: Not on file  . Years of education: Not on file  . Highest education level: Not on file  Occupational History  . Not on file  Social Needs  . Financial resource strain: Not on file  . Food insecurity:    Worry: Not on file    Inability: Not on file  . Transportation needs:    Medical: Not on file    Non-medical: Not on file  Tobacco Use  . Smoking status: Current Some Day Smoker    Packs/day: 0.50    Years: 40.00    Pack years: 20.00  . Smokeless tobacco: Never Used  Substance and Sexual Activity  . Alcohol use: No  . Drug use: No  . Sexual activity: Not on file  Lifestyle  . Physical activity:    Days per week: Not on file    Minutes per session: Not on file  . Stress: Not on file  Relationships  . Social connections:    Talks on phone: Not on file    Gets together: Not on file    Attends religious service: Not on file    Active member of club or organization: Not on file    Attends meetings of clubs or organizations: Not on file    Relationship status: Not on file  . Intimate partner violence:    Fear of current or ex partner: Not on file    Emotionally abused: Not on file    Physically abused: Not on file    Forced sexual activity: Not on file  Other Topics Concern  . Not on file  Social History Narrative  . Not on file     Review of Systems: General: negative for chills, fever, night sweats or weight changes.  Cardiovascular: negative for chest pain, dyspnea on exertion, edema, orthopnea, palpitations, paroxysmal nocturnal dyspnea or shortness of breath Dermatological: negative for rash Respiratory: negative for cough or wheezing Urologic: negative for hematuria Abdominal: negative for nausea, vomiting, diarrhea, bright red blood per  rectum, melena, or hematemesis Neurologic: negative for visual changes, syncope, or dizziness All other systems reviewed and are otherwise negative except as noted above.   Physical Exam:  Blood pressure (!) 102/58, pulse 86, height 5\' 5"  (1.651 m), weight 148 lb 12 oz (67.5 kg).  General appearance: alert, cooperative and no distress Neck: no carotid bruit and no JVD Lungs: clear to auscultation bilaterally Heart: regular rate and rhythm, S1, S2 normal, no murmur, click, rub or gallop Extremities: extremities normal, atraumatic, no cyanosis or edema Pulses: 2+ and symmetric Skin: Skin color, texture, turgor normal. No rashes or lesions Neurologic: Grossly normal  EKG not performed  -- personally reviewed   ASSESSMENT AND PLAN:   1. NICM: EF 20-25%. Mild nonobstructive CAD on cath. His volume is stable. No weight gain, LEE, orthopnea nor PND. He does note exertional fatigue with minimal activity. No palpitations, syncope, near syncope. We will continue low dose lisinopril and  will start low dose BB. BP is currently 102/58. HR 86 bpm. Will start Toprol XL, 12.5 mg once daily. We discussed low sodium diet and daily weights. He will f/u in HTN clinic in 2 weeks for further med adjustment and again with Dr. Meda Coffee or APP in 4-5 weeks for further titration of HF meds. He will need a repeat echo after 3 months of maximally tolerated medical therapy for systolic HF and referal to EP for consideration for ICD if EF remains < 35%.    Tulani Kidney Ladoris Gene, M[facility] 04/18/2018 9:39 AM

## 2018-04-18 NOTE — Patient Instructions (Addendum)
Medication Instructions:   START  TAKING TOPROL XL 12.5 MG ONCE A DAY    If you need a refill on your cardiac medications before your next appointment, please call your pharmacy.  Labwork: NONE ORDERED  TODAY    Testing/Procedures: NONE ORDERED  TODAY    Follow-Up:  2 WEEKS WITH HTN CLINIC FOR  MEDICATION TITRATION      4-5 WEEKS WITH [facility]    3 MONTHS WITH DR Meda Coffee     Any Other Special Instructions Will Be Listed Below (If Applicable).

## 2018-05-02 ENCOUNTER — Encounter: Payer: Self-pay | Admitting: Cardiology

## 2018-05-11 NOTE — Progress Notes (Signed)
Patient ID: Juan Norris                 DOB: 04-09-1951                      MRN: [id number]     HPI: Juan Norris is a 67 y.o. male patient of Dr. Meda Coffee who presents today for medication titration. PMH significant for tobacco abuse, hypertension and family history of coronary disease.  He recently presented to [facility] on March 29, 2018 with chest pain concerning for unstable angina.  He ruled in for non-STEMI with troponin peaking at 1.43.  He underwent cardiac catheterization on April 17 which showed mild nonobstructive CAD.  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  Medical therapy was recommended.  Echocardiogram was performed and revealed severely reduced left ventricular EF down to 20 to 25% with diffuse hypokinesis along with grade 2 diastolic dysfunction, making the diagnosis of nonischemic cardiomyopathy.  Medical management was recommended however limited due to hypotension.  Prior to discharge from the hospital, he was only able to tolerate low-dose ACE inhibitor.  He was placed on lisinopril 5 mg daily.  He presents today for additional medication titration. He denies dizziness. If he does not eat, his pills upset his stomach. He states that if he vacuums or works on house he will get "a little sting" in his chest. He states that on a scale of 1-10 this pain is 0.001. He is following low sodium diet.   Current HTN meds:  Lisinopril 5mg  daily in the morning Metoprolol succinate 12.5mg  daily in the morning  Previously tried: none  BP goal: <130/80  Family History: mother - died of leukemia, father passed of cancer, brother with stents, brother with pacemaker, brother with pacemakers, sister passed suddenly in 6s  Social History: current smoker (much less per his report - 1 pack last 2 weeks), denies alcohol (last drink Oct 1999 and last drugs then too)  Home BP readings: He does not have a cuff  Wt Readings from Last 3 Encounters:  04/18/18 148 lb 12 oz (67.5  kg)  03/30/18 152 lb 12.5 oz (69.3 kg)  05/07/17 163 lb 9.6 oz (74.2 kg)   BP Readings from Last 3 Encounters:  05/12/18 (!) 104/50  04/18/18 (!) 102/58  03/31/18 (!) 110/45   Pulse Readings from Last 3 Encounters:  05/12/18 61  04/18/18 86  03/31/18 60    Renal function: CrCl cannot be calculated (Patient's most recent lab result is older than the maximum 21 days allowed.).  Past Medical History:  Diagnosis Date  . Acute renal insufficiency   . Anginal pain ([place])   . Hypertension   . Myocardial infarction ([place])   . Systolic heart failure ([place])   . Tobacco use     Current Outpatient Medications on File Prior to Visit  Medication Sig Dispense Refill  . aspirin EC 81 MG EC tablet Take 1 tablet (81 mg total) by mouth daily.    Marland Kitchen atorvastatin (LIPITOR) 80 MG tablet Take 1 tablet (80 mg total) by mouth daily. 60 tablet 3  . lisinopril (PRINIVIL,ZESTRIL) 5 MG tablet Take 1 tablet (5 mg total) by mouth daily. 60 tablet 30  . metoprolol succinate (TOPROL-XL) 25 MG 24 hr tablet Take 0.5 tablets (12.5 mg total) by mouth daily. 30 tablet 6  . Multiple Vitamin (MULTIVITAMIN WITH MINERALS) TABS tablet Take 1 tablet by mouth daily.    . nitroGLYCERIN (NITROSTAT)  0.4 MG SL tablet Place 1 tablet (0.4 mg total) under the tongue every 5 (five) minutes x 3 doses as needed for chest pain. (Patient not taking: Reported on 03/29/2018) 30 tablet 12   No current facility-administered medications on file prior to visit.     No Known Allergies  Blood pressure (!) 104/50, pulse 61.   Assessment/Plan: Hypertension: BMET today with starting lisinopril. BP today is soft. Will continue medications at current doses as I am not sure that diastolic pressure will tolerate a dose increase. Follow up with Ellen Henri, PA and Dr. Meda Coffee as scheduled. Follow up with HTN clinic as needed.    Thank you, Lelan Pons. Patterson Hammersmith, [facility]  05/12/2018 9:10 AM

## 2018-05-12 ENCOUNTER — Encounter: Payer: Self-pay | Admitting: Pharmacist

## 2018-05-12 ENCOUNTER — Ambulatory Visit (INDEPENDENT_AMBULATORY_CARE_PROVIDER_SITE_OTHER): Payer: Medicare Other | Admitting: Pharmacist

## 2018-05-12 VITALS — BP 104/50 | HR 61

## 2018-05-12 DIAGNOSIS — I5041 Acute combined systolic (congestive) and diastolic (congestive) heart failure: Secondary | ICD-10-CM

## 2018-05-12 LAB — BASIC METABOLIC PANEL
BUN/Creatinine Ratio: 14 (ref 10–24)
BUN: 13 mg/dL (ref 8–27)
CO2: 24 mmol/L (ref 20–29)
Calcium: 9.1 mg/dL (ref 8.6–10.2)
Chloride: 106 mmol/L (ref 96–106)
Creatinine, Ser: 0.96 mg/dL (ref 0.76–1.27)
GFR calc Af Amer: 94 mL/min/{1.73_m2} (ref >59)
GFR calc non Af Amer: 81 mL/min/{1.73_m2} (ref >59)
Glucose: 126 mg/dL — ABNORMAL HIGH (ref 65–99)
Potassium: 4.5 mmol/L (ref 3.5–5.2)
Sodium: 142 mmol/L (ref 134–144)

## 2018-05-12 NOTE — Patient Instructions (Signed)
Blood work today Artist).   CONTINUE all medications as prescribed.   Follow up as scheduled.

## 2018-05-24 ENCOUNTER — Encounter (INDEPENDENT_AMBULATORY_CARE_PROVIDER_SITE_OTHER): Payer: Self-pay

## 2018-05-24 ENCOUNTER — Ambulatory Visit (INDEPENDENT_AMBULATORY_CARE_PROVIDER_SITE_OTHER): Payer: Medicare Other | Admitting: Cardiology

## 2018-05-24 ENCOUNTER — Encounter: Payer: Self-pay | Admitting: Cardiology

## 2018-05-24 VITALS — BP 118/60 | HR 54 | Ht 65.0 in | Wt 150.0 lb

## 2018-05-24 DIAGNOSIS — I428 Other cardiomyopathies: Secondary | ICD-10-CM

## 2018-05-24 MED ORDER — LISINOPRIL 5 MG PO TABS: 5.0000 mg | ORAL_TABLET | Freq: Every day | ORAL | 3 refills | Status: DC

## 2018-05-24 NOTE — Progress Notes (Signed)
05/24/2018 Juan Norris   1951/11/21  [id number]  Primary Physician Default, Provider, MD Primary Cardiologist: Dr. Meda Coffee    Reason for Visit/CC: f/u for NICM  HPI:  Juan Norris is a 67 y.o. male who is being seen today for management of nonischemic cardiomyopathy.  He has a history of tobacco abuse, hypertension and family history of coronary disease.  He recently presented to [facility] on March 29, 2018 with chest pain concerning for unstable angina.  He ruled in for non-STEMI with troponin peaking at 1.43.  He underwent cardiac catheterization on April 17 which showed mild nonobstructive CAD.  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  Medical therapy was recommended.  Echocardiogram was performed and revealed severely reduced left ventricular EF down to 20 to 25% with diffuse hypokinesis along with grade 2 diastolic dysfunction, making the diagnosis of nonischemic cardiomyopathy.  Medical management was recommended however limited due to hypotension.  Prior to discharge from the hospital, he was only able to tolerate low-dose ACE inhibitor.  He was placed on lisinopril 5 mg daily.  I evaluated him for post hospital f/u on 04/18/18. He was doing fairly well. No post cath complications and volume was stable, however he noted some mild fatigue. No recurrent CP. BP was 102/58. HR 86 bpm. I started him on low dose BB, Toprol XL, 12.5 mg once daily. He had a f/u visit with the pharmacist 3 weeks later to reassess response to medication and for further medication adjustment. He tolerated the addition of metoprolol ok w/o side effects, however his BP at f/u was too soft for further med titration. No adjustments made.   He is back today for f/u. Still doing ok. His exercise tolerance is a bit better but still gets tired with long distance walking. No CP, palpitations, dizziness, syncope/ near syncope. No weight changes. Denies LEE, orthopnea and PND. He reports full med compliance but not  yet taken morning meds. His BP prior to morning meds is 118/60 (due to take lisinopril 5 and Toprol 12.5). HR in the 70s. He avoids salt.   Cardiac Studies  Cath 03/30/18:   Colon Flattery 2nd Mrg lesion is 40% stenosed.  Prox Cx to Mid Cx lesion is 20% stenosed.  Prox LAD lesion is 10% stenosed.  1. Mild non-obstructive CAD. His presentation could be due to coronary vasospasm.  2. Normal LV filling pressure.   Recommendations: Medical management of mild CAD. Consider addition of a long acting nitrate or calcium channel blocker for possible spasm. Echo is planned today to assess LV function.    Echo 03/30/18: Study Conclusions  - Left ventricle: The cavity size was mildly dilated. Wall thickness was increased in a pattern of mild LVH. Systolic function was severely reduced. The estimated ejection fraction was in the range of 20% to 25%. Diffuse hypokinesis. Features are consistent with a pseudonormal left ventricular filling pattern, with concomitant abnormal relaxation and increased filling pressure (grade 2 diastolic dysfunction). - Aortic valve: Trileaflet; moderately thickened, moderately calcified leaflets. There was mild regurgitation. - Mitral valve: Mobility was restricted. The findings are consistent with mild stenosis. There was mild regurgitation. Mean gradient (D): 6 mm Hg. Valve area by pressure half-time: 2.29 cm^2. Valve area by continuity equation (using LVOT flow): 1.28 cm^2. - Tricuspid valve: There was mild regurgitation.   Current Meds  Medication Sig  . aspirin EC 81 MG EC tablet Take 1 tablet (81 mg total) by mouth daily.  Juan Norris Kitchen atorvastatin (LIPITOR) 80  MG tablet Take 1 tablet (80 mg total) by mouth daily.  Juan Norris Kitchen lisinopril (PRINIVIL,ZESTRIL) 5 MG tablet Take 1 tablet (5 mg total) by mouth daily.  . metoprolol succinate (TOPROL-XL) 25 MG 24 hr tablet Take 0.5 tablets (12.5 mg total) by mouth daily.  . Multiple Vitamin (MULTIVITAMIN WITH  MINERALS) TABS tablet Take 1 tablet by mouth daily.   No Known Allergies Past Medical History:  Diagnosis Date  . Acute renal insufficiency   . Anginal pain ([place])   . Hypertension   . Myocardial infarction ([place])   . Systolic heart failure ([place])   . Tobacco use    Family History  Problem Relation Age of Onset  . CAD Brother    Past Surgical History:  Procedure Laterality Date  . CARDIAC CATHETERIZATION    . KNEE SURGERY    . LEFT HEART CATH AND CORONARY ANGIOGRAPHY N/A 03/30/2018   Procedure: LEFT HEART CATH AND CORONARY ANGIOGRAPHY;  Surgeon: Burnell Blanks, MD;  Location: [place] [facility];  Service: Cardiovascular;  Laterality: N/A;   Social History   Socioeconomic History  . Marital status: Married    Spouse name: Not on file  . Number of children: Not on file  . Years of education: Not on file  . Highest education level: Not on file  Occupational History  . Not on file  Social Needs  . Financial resource strain: Not on file  . Food insecurity:    Worry: Not on file    Inability: Not on file  . Transportation needs:    Medical: Not on file    Non-medical: Not on file  Tobacco Use  . Smoking status: Current Some Day Smoker    Packs/day: 0.50    Years: 40.00    Pack years: 20.00  . Smokeless tobacco: Never Used  Substance and Sexual Activity  . Alcohol use: No  . Drug use: No  . Sexual activity: Not on file  Lifestyle  . Physical activity:    Days per week: Not on file    Minutes per session: Not on file  . Stress: Not on file  Relationships  . Social connections:    Talks on phone: Not on file    Gets together: Not on file    Attends religious service: Not on file    Active member of club or organization: Not on file    Attends meetings of clubs or organizations: Not on file    Relationship status: Not on file  . Intimate partner violence:    Fear of current or ex partner: Not on file    Emotionally abused: Not on file    Physically  abused: Not on file    Forced sexual activity: Not on file  Other Topics Concern  . Not on file  Social History Narrative  . Not on file     Review of Systems: General: negative for chills, fever, night sweats or weight changes.  Cardiovascular: negative for chest pain, dyspnea on exertion, edema, orthopnea, palpitations, paroxysmal nocturnal dyspnea or shortness of breath Dermatological: negative for rash Respiratory: negative for cough or wheezing Urologic: negative for hematuria Abdominal: negative for nausea, vomiting, diarrhea, bright red blood per rectum, melena, or hematemesis Neurologic: negative for visual changes, syncope, or dizziness All other systems reviewed and are otherwise negative except as noted above.   Physical Exam:  Blood pressure 118/60, pulse (!) 54, height 5\' 5"  (1.651 m), weight 150 lb (68 kg), SpO2 98 %.  General  appearance: alert, cooperative and no distress Neck: no carotid bruit and no JVD Lungs: clear to auscultation bilaterally Heart: regular rate and rhythm, S1, S2 normal, no murmur, click, rub or gallop Extremities: extremities normal, atraumatic, no cyanosis or edema Pulses: 2+ and symmetric Skin: Skin color, texture, turgor normal. No rashes or lesions Neurologic: Grossly normal  EKG not performed  -- personally reviewed   ASSESSMENT AND PLAN:   1. NICM: Recent diagnosis.  Echocardiogram April 2019 showed an EF of of 20 to 25% with cardiac catheterization showing normal coronary arteries.  He is on medical therapy with low-dose ACE inhibitor and low-dose beta-blocker.  At his last 2 office visits, we have tried titrating his heart failure regimen however have been limited by soft blood pressure.  He is currently tolerating 5 mg of lisinopril and 12.5 mg of metoprolol succinate once daily.  BP today prior to taking his morning medications is 118/60.  I do not think he will be able to tolerate any higher doses of medications at this time.  This may  just be the maximum amount of medications he will be able to tolerate.  He has follow-up with Dr. Meda Coffee in 3 months.  He is stable from a volume standpoint.  We will continue his medications at the current dose until his next office appointment.  Dr. Meda Coffee may elect to get a repeat echocardiogram at that time to reassess left ventricular EF.  If his EF remains less than 35% despite maximally tolerated heart failure medications, then he will need referral to EP for consideration for ICD for primary prevention of sudden cardiac death.  He was reminded to continue with daily weights and to notify our office if he gains more than 3 pounds in 24 hours more than 5 pounds in 1 week.  We also discussed the importance of maintaining a low-sodium diet, which he has been doing.   Follow-Up w/ Dr. Meda Coffee in 3 months.   Shirrell Solinger Ladoris Gene, MHS [facility] 05/24/2018 11:58 AM

## 2018-05-24 NOTE — Addendum Note (Signed)
Addended by: Claude Manges on: 05/24/2018 02:29 PM   Modules accepted: Orders

## 2018-05-24 NOTE — Patient Instructions (Signed)
Medication Instructions:   Your physician recommends that you continue on your current medications as directed. Please refer to the Current Medication list given to you today.   If you need a refill on your cardiac medications before your next appointment, please call your pharmacy.  Labwork: NONE ORDERED  TODAY    Testing/Procedures: NONE ORDERED  TODAY    Follow-Up: AS SCHEDULED WITH DR Meda Coffee     Any Other Special Instructions Will Be Listed Below (If Applicable).   MAKE SURE YOU CHECK YOUR WEIGHT DAILY. CONTACT OFFICE BACK IF YOU GAIN 3 LBS IN 24 HOURS OR 5 LBS IN A WEEK

## 2018-06-24 ENCOUNTER — Telehealth: Payer: Self-pay | Admitting: Cardiology

## 2018-06-24 NOTE — Telephone Encounter (Signed)
New Message    [facility] is calling they need the patient's last B/P and echo reading.

## 2018-06-27 NOTE — Telephone Encounter (Signed)
Voicemail left with [facility] representative. Asked her to fax over EF% form.

## 2018-09-01 ENCOUNTER — Encounter: Payer: Self-pay | Admitting: Cardiology

## 2018-09-01 ENCOUNTER — Ambulatory Visit (INDEPENDENT_AMBULATORY_CARE_PROVIDER_SITE_OTHER): Payer: Medicare Other | Admitting: Cardiology

## 2018-09-01 VITALS — BP 124/76 | HR 50 | Ht 65.0 in | Wt 155.0 lb

## 2018-09-01 DIAGNOSIS — I428 Other cardiomyopathies: Secondary | ICD-10-CM

## 2018-09-01 DIAGNOSIS — I5043 Acute on chronic combined systolic (congestive) and diastolic (congestive) heart failure: Secondary | ICD-10-CM

## 2018-09-01 DIAGNOSIS — I5041 Acute combined systolic (congestive) and diastolic (congestive) heart failure: Secondary | ICD-10-CM

## 2018-09-01 MED ORDER — SACUBITRIL-VALSARTAN 24-26 MG PO TABS: 1.0000 | ORAL_TABLET | Freq: Two times a day (BID) | ORAL | 2 refills | Status: DC

## 2018-09-01 NOTE — Patient Instructions (Addendum)
Medication Instructions:   STOP TAKING LISINOPRIL NOW  START TAKING ENTRESTO 24-26 MG BY MOUTH TWICE DAILY--START TAKING THIS Saturday 09/03/18.    Testing/Procedures:  Your physician has requested that you have an echocardiogram. Echocardiography is a painless test that uses sound waves to create images of your heart. It provides your doctor with information about the size and shape of your heart and how well your heart's chambers and valves are working. This procedure takes approximately one hour. There are no restrictions for this procedure.    Follow-Up:  3 MONTHS WITH DR Meda Coffee OR WITH AN EXTENDER ON HER TEAM SAME DAY SHE IS IN THE OFFICE       If you need a refill on your cardiac medications before your next appointment, please call your pharmacy.

## 2018-09-01 NOTE — Progress Notes (Signed)
09/03/2018 Juan Norris   09-03-1951  [id number]  Primary Physician Default, Provider, MD Primary Cardiologist: Dr. Meda Coffee    Reason for Visit/CC: 3 months f/u for NICM  HPI:  Juan Norris is a 67 y.o. male who is being seen today for management of nonischemic cardiomyopathy.  He has a history of tobacco abuse, hypertension and family history of coronary disease.  He presented to [facility] on March 29, 2018 with chest pain concerning for unstable angina.  He ruled in for non-STEMI with troponin peaking at 1.43.  He underwent cardiac catheterization on April 17 which showed mild nonobstructive CAD.  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  Medical therapy was recommended.  Echocardiogram showed LVEF 20 to 25% with diffuse hypokinesis along with grade 2 diastolic dysfunction.  09/01/2018 - 3 months follow up, he states that his exercise tolerance has improved slightly, he is functional class IIa, he is able to tolerate his meds, denies dizziness, palpitations, LE edema, orthopnea, PND.  Cardiac Studies  Cath 03/30/18:   Colon Flattery 2nd Mrg lesion is 40% stenosed.  Prox Cx to Mid Cx lesion is 20% stenosed.  Prox LAD lesion is 10% stenosed.  1. Mild non-obstructive CAD. His presentation could be due to coronary vasospasm.  2. Normal LV filling pressure.   Recommendations: Medical management of mild CAD. Consider addition of a long acting nitrate or calcium channel blocker for possible spasm. Echo is planned today to assess LV function.    Echo 03/30/18: Study Conclusions  - Left ventricle: The cavity size was mildly dilated. Wall thickness was increased in a pattern of mild LVH. Systolic function was severely reduced. The estimated ejection fraction was in the range of 20% to 25%. Diffuse hypokinesis. Features are consistent with a pseudonormal left ventricular filling pattern, with concomitant abnormal relaxation and increased filling pressure (grade 2  diastolic dysfunction). - Aortic valve: Trileaflet; moderately thickened, moderately calcified leaflets. There was mild regurgitation. - Mitral valve: Mobility was restricted. The findings are consistent with mild stenosis. There was mild regurgitation. Mean gradient (D): 6 mm Hg. Valve area by pressure half-time: 2.29 cm^2. Valve area by continuity equation (using LVOT flow): 1.28 cm^2. - Tricuspid valve: There was mild regurgitation.   Current Meds  Medication Sig  . aspirin EC 81 MG EC tablet Take 1 tablet (81 mg total) by mouth daily.  Marland Kitchen atorvastatin (LIPITOR) 80 MG tablet Take 1 tablet (80 mg total) by mouth daily.  . metoprolol succinate (TOPROL-XL) 25 MG 24 hr tablet Take 0.5 tablets (12.5 mg total) by mouth daily.  . Multiple Vitamin (MULTIVITAMIN WITH MINERALS) TABS tablet Take 1 tablet by mouth daily.  . nitroGLYCERIN (NITROSTAT) 0.4 MG SL tablet Place 1 tablet (0.4 mg total) under the tongue every 5 (five) minutes x 3 doses as needed for chest pain.  . [DISCONTINUED] lisinopril (PRINIVIL,ZESTRIL) 5 MG tablet Take 1 tablet (5 mg total) by mouth daily.   No Known Allergies Past Medical History:  Diagnosis Date  . Acute renal insufficiency   . Anginal pain ([place])   . Hypertension   . Myocardial infarction ([place])   . Systolic heart failure ([place])   . Tobacco use    Family History  Problem Relation Age of Onset  . CAD Brother    Past Surgical History:  Procedure Laterality Date  . CARDIAC CATHETERIZATION    . KNEE SURGERY    . LEFT HEART CATH AND CORONARY ANGIOGRAPHY N/A 03/30/2018   Procedure: LEFT HEART  CATH AND CORONARY ANGIOGRAPHY;  Surgeon: Burnell Blanks, MD;  Location: [place] [facility];  Service: Cardiovascular;  Laterality: N/A;   Social History   Socioeconomic History  . Marital status: Married    Spouse name: Not on file  . Number of children: Not on file  . Years of education: Not on file  . Highest education level: Not on file    Occupational History  . Not on file  Social Needs  . Financial resource strain: Not on file  . Food insecurity:    Worry: Not on file    Inability: Not on file  . Transportation needs:    Medical: Not on file    Non-medical: Not on file  Tobacco Use  . Smoking status: Current Some Day Smoker    Packs/day: 0.50    Years: 40.00    Pack years: 20.00  . Smokeless tobacco: Never Used  Substance and Sexual Activity  . Alcohol use: No  . Drug use: No  . Sexual activity: Not on file  Lifestyle  . Physical activity:    Days per week: Not on file    Minutes per session: Not on file  . Stress: Not on file  Relationships  . Social connections:    Talks on phone: Not on file    Gets together: Not on file    Attends religious service: Not on file    Active member of club or organization: Not on file    Attends meetings of clubs or organizations: Not on file    Relationship status: Not on file  . Intimate partner violence:    Fear of current or ex partner: Not on file    Emotionally abused: Not on file    Physically abused: Not on file    Forced sexual activity: Not on file  Other Topics Concern  . Not on file  Social History Narrative  . Not on file     Review of Systems: General: negative for chills, fever, night sweats or weight changes.  Cardiovascular: negative for chest pain, dyspnea on exertion, edema, orthopnea, palpitations, paroxysmal nocturnal dyspnea or shortness of breath Dermatological: negative for rash Respiratory: negative for cough or wheezing Urologic: negative for hematuria Abdominal: negative for nausea, vomiting, diarrhea, bright red blood per rectum, melena, or hematemesis Neurologic: negative for visual changes, syncope, or dizziness All other systems reviewed and are otherwise negative except as noted above.   Physical Exam:  Blood pressure 124/76, pulse (!) 50, height 5\' 5"  (1.651 m), weight 155 lb (70.3 kg), SpO2 94 %.  General appearance: alert,  cooperative and no distress Neck: no carotid bruit and no JVD Lungs: clear to auscultation bilaterally Heart: regular rate and rhythm, S1, S2 normal, no murmur, click, rub or gallop Extremities: extremities normal, atraumatic, no cyanosis or edema Pulses: 2+ and symmetric Skin: Skin color, texture, turgor normal. No rashes or lesions Neurologic: Grossly normal  EKG performed, showed SB, 1.AVB, NSIVCD, unchanged from prior  -- personally reviewed   ASSESSMENT AND PLAN:   1. NICM: normal cath in 03/2018, LVEF 20-25%, we will repeat echocardiogram, functional class IIa, switch lisinopril to Entresto 26/24 mg, advised to hold lisinopril for 2 days before starting Entresto. Continue metoprolol. HR 50 BPM.  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 Hypertension - controlled  Follow-Up w/ Dr. Meda Coffee in 3 months.   Ena Dawley , MD [facility] 09/03/2018 10:52 PM

## 2018-09-06 ENCOUNTER — Telehealth: Payer: Self-pay

## 2018-09-06 NOTE — Telephone Encounter (Signed)
I have done an [place] through covermymeds. Key: [id number]  I received the following message: Gertha Calkin (Key: Eda Keys)  Delene Loll 24-26MG  OR TABS    Message from [employer] CW:[id number];HWTUUE:[id number];Review Type:Prior Auth;Coverage Start Date:08/07/2018;Coverage End Date:12/13/2098

## 2018-09-19 ENCOUNTER — Other Ambulatory Visit: Payer: Self-pay

## 2018-09-19 ENCOUNTER — Ambulatory Visit (HOSPITAL_COMMUNITY): Payer: Medicare Other | Attending: Cardiovascular Disease

## 2018-09-19 DIAGNOSIS — I080 Rheumatic disorders of both mitral and aortic valves: Secondary | ICD-10-CM | POA: Insufficient documentation

## 2018-09-19 DIAGNOSIS — R079 Chest pain, unspecified: Secondary | ICD-10-CM | POA: Diagnosis not present

## 2018-09-19 DIAGNOSIS — R0600 Dyspnea, unspecified: Secondary | ICD-10-CM | POA: Insufficient documentation

## 2018-09-19 DIAGNOSIS — I428 Other cardiomyopathies: Secondary | ICD-10-CM | POA: Insufficient documentation

## 2018-09-19 DIAGNOSIS — I5043 Acute on chronic combined systolic (congestive) and diastolic (congestive) heart failure: Secondary | ICD-10-CM | POA: Diagnosis not present

## 2018-09-19 DIAGNOSIS — Z720 Tobacco use: Secondary | ICD-10-CM | POA: Diagnosis not present

## 2018-09-19 DIAGNOSIS — I110 Hypertensive heart disease with heart failure: Secondary | ICD-10-CM | POA: Diagnosis not present

## 2018-09-19 DIAGNOSIS — I5041 Acute combined systolic (congestive) and diastolic (congestive) heart failure: Secondary | ICD-10-CM

## 2018-09-19 DIAGNOSIS — I252 Old myocardial infarction: Secondary | ICD-10-CM | POA: Insufficient documentation

## 2018-09-19 DIAGNOSIS — Z8249 Family history of ischemic heart disease and other diseases of the circulatory system: Secondary | ICD-10-CM | POA: Insufficient documentation

## 2018-09-19 DIAGNOSIS — I5042 Chronic combined systolic (congestive) and diastolic (congestive) heart failure: Secondary | ICD-10-CM | POA: Insufficient documentation

## 2018-09-29 ENCOUNTER — Ambulatory Visit (INDEPENDENT_AMBULATORY_CARE_PROVIDER_SITE_OTHER): Payer: Medicare Other | Admitting: Cardiology

## 2018-09-29 ENCOUNTER — Telehealth: Payer: Self-pay | Admitting: *Deleted

## 2018-09-29 ENCOUNTER — Encounter: Payer: Self-pay | Admitting: Cardiology

## 2018-09-29 VITALS — BP 110/56 | HR 65 | Ht 65.0 in | Wt 151.0 lb

## 2018-09-29 DIAGNOSIS — I5041 Acute combined systolic (congestive) and diastolic (congestive) heart failure: Secondary | ICD-10-CM

## 2018-09-29 DIAGNOSIS — I447 Left bundle-branch block, unspecified: Secondary | ICD-10-CM

## 2018-09-29 DIAGNOSIS — I428 Other cardiomyopathies: Secondary | ICD-10-CM | POA: Diagnosis not present

## 2018-09-29 DIAGNOSIS — I10 Essential (primary) hypertension: Secondary | ICD-10-CM

## 2018-09-29 LAB — BASIC METABOLIC PANEL
BUN/Creatinine Ratio: 12 (ref 10–24)
BUN: 11 mg/dL (ref 8–27)
CO2: 23 mmol/L (ref 20–29)
Calcium: 9.1 mg/dL (ref 8.6–10.2)
Chloride: 105 mmol/L (ref 96–106)
Creatinine, Ser: 0.94 mg/dL (ref 0.76–1.27)
GFR calc Af Amer: 97 mL/min/{1.73_m2} (ref >59)
GFR calc non Af Amer: 84 mL/min/{1.73_m2} (ref >59)
Glucose: 141 mg/dL — ABNORMAL HIGH (ref 65–99)
Potassium: 4.4 mmol/L (ref 3.5–5.2)
Sodium: 143 mmol/L (ref 134–144)

## 2018-09-29 LAB — PRO B NATRIURETIC PEPTIDE: NT-Pro BNP: 2041 pg/mL — ABNORMAL HIGH (ref 0–376)

## 2018-09-29 MED ORDER — ATORVASTATIN CALCIUM 40 MG PO TABS: 40.0000 mg | ORAL_TABLET | Freq: Every day | ORAL | 2 refills | Status: DC

## 2018-09-29 MED ORDER — SPIRONOLACTONE 25 MG PO TABS: 12.5000 mg | ORAL_TABLET | Freq: Every day | ORAL | 3 refills | Status: DC

## 2018-09-29 NOTE — Telephone Encounter (Signed)
-----   Message from Dorothy Spark, MD sent at 09/29/2018  6:15 PM EDT ----- High BNP, normal kidney function and electrolytes, please add aldactone 12.5 mg po daily to his regimen

## 2018-09-29 NOTE — Patient Instructions (Signed)
Medication Instructions:   DECREASE YOUR ATORVASTATIN (LIPITOR) TO 40 MG ONCE DAILY  If you need a refill on your cardiac medications before your next appointment, please call your pharmacy.     Lab work:  TODAY--BMET AND PRO-BNP If you have labs (blood work) drawn today and your tests are completely normal, you will receive your results only by: Marland Kitchen MyChart Message (if you have MyChart) OR . A paper copy in the mail If you have any lab test that is abnormal or we need to change your treatment, we will call you to review the results.    Testing/Procedures:  Your physician has requested that you have an echocardiogram. Echocardiography is a painless test that uses sound waves to create images of your heart. It provides your doctor with information about the size and shape of your heart and how well your heart's chambers and valves are working. This procedure takes approximately one hour. There are no restrictions for this procedure.  THIS ECHO NEEDS TO BE SCHEDULED FOR 3 MONTHS OUT PER DR NELSON    You have been referred to SEE OUR ELECTROPHYSIOLOGIST HERE IN OUR OFFICE FOR CONSIDERATION OF ICD IMPLANTATION     Follow-Up: At [facility], you and your health needs are our priority.  As part of our continuing mission to provide you with exceptional heart care, we have created designated Provider Care Teams.  These Care Teams include your primary Cardiologist (physician) and Advanced Practice Providers (APPs -  Physician Assistants and Nurse Practitioners) who all work together to provide you with the care you need, when you need it. You will need a follow up appointment in 4 months.  Please call our office 2 months in advance to schedule this appointment.  You may see Ena Dawley, MD or one of the following Advanced Practice Providers on your designated Care Team:   [place], PA-C Melina Copa, PA-C . Ermalinda Barrios, PA-C

## 2018-09-29 NOTE — Progress Notes (Signed)
09/29/2018 [facility]   March 22, 1951  [id number]  Primary Physician Default, Provider, MD Primary Cardiologist: Dr. Meda Norris   Reason for Visit/CC: 3 months f/u for NICM  HPI:  J[facility] is a 67 y.o. male who is being seen today for management of nonischemic cardiomyopathy.  He has a history of tobacco abuse, hypertension and family history of coronary disease.  He presented to [facility] on March 29, 2018 with chest pain concerning for unstable angina.  He ruled in for non-STEMI with troponin peaking at 1.43.  He underwent cardiac catheterization on April 17 which showed mild nonobstructive CAD.  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  Medical therapy was recommended.  Echocardiogram showed LVEF 20 to 25% with diffuse hypokinesis along with grade 2 diastolic dysfunction.  09/29/2018, the patient is coming up after 3 months, he has been very compliant with his medications, he has been switched to [facility] in September, so far with no improvement in symptoms.  Overall since April he feels better however he gets really tired and short of breath after walking minimal distances such as one block.  He denies any palpitation dizziness or syncope.  He has no lower extremity edema orthopnea or proximal nocturnal dyspnea.  He tolerates his medications well.   Cardiac Studies  Cath 03/30/18:   [facility] 2nd Mrg lesion is 40% stenosed.  Prox Cx to Mid Cx lesion is 20% stenosed.  Prox LAD lesion is 10% stenosed.  1. Mild non-obstructive CAD. His presentation could be due to coronary vasospasm.  2. Normal LV filling pressure.   Recommendations: Medical management of mild CAD. Consider addition of a long acting nitrate or calcium channel blocker for possible spasm. Echo is planned today to assess LV function.    Echo 03/30/18: Study Conclusions  - Left ventricle: The cavity size was mildly dilated. Wall thickness was increased in a pattern of mild LVH. Systolic function  was severely reduced. The estimated ejection fraction was in the range of 20% to 25%. Diffuse hypokinesis. Features are consistent with a pseudonormal left ventricular filling pattern, with concomitant abnormal relaxation and increased filling pressure (grade 2 diastolic dysfunction). - Aortic valve: Trileaflet; moderately thickened, moderately calcified leaflets. There was mild regurgitation. - Mitral valve: Mobility was restricted. The findings are consistent with mild stenosis. There was mild regurgitation. Mean gradient (D): 6 mm Hg. Valve area by pressure half-time: 2.29 cm^2. Valve area by continuity equation (using LVOT flow): 1.28 cm^2. - Tricuspid valve: There was mild regurgitation.   Current Meds  Medication Sig  . aspirin EC 81 MG EC tablet Take 1 tablet (81 mg total) by mouth daily.  Marland Kitchen atorvastatin (LIPITOR) 80 MG tablet Take 1 tablet (80 mg total) by mouth daily.  . metoprolol succinate (TOPROL-XL) 25 MG 24 hr tablet Take 0.5 tablets (12.5 mg total) by mouth daily.  . Multiple Vitamin (MULTIVITAMIN WITH MINERALS) TABS tablet Take 1 tablet by mouth daily.  . nitroGLYCERIN (NITROSTAT) 0.4 MG SL tablet Place 1 tablet (0.4 mg total) under the tongue every 5 (five) minutes x 3 doses as needed for chest pain.  . sacubitril-valsartan (ENTRESTO) 24-26 MG Take 1 tablet by mouth 2 (two) times daily.   No Known Allergies Past Medical History:  Diagnosis Date  . Acute renal insufficiency   . Anginal pain ([facility])   . Hypertension   . Myocardial infarction ([facility])   . Systolic heart failure ([facility])   . Tobacco use    Family History  Problem Relation Age of Onset  . CAD Brother    Past Surgical History:  Procedure Laterality Date  . CARDIAC CATHETERIZATION    . KNEE SURGERY    . LEFT HEART CATH AND CORONARY ANGIOGRAPHY N/A 03/30/2018   Procedure: LEFT HEART CATH AND CORONARY ANGIOGRAPHY;  Surgeon: Juan Blanks, MD;  Location: [facility];   Service: Cardiovascular;  Laterality: N/A;   Social History   Socioeconomic History  . Marital status: Married    Spouse name: Not on file  . Number of children: Not on file  . Years of education: Not on file  . Highest education level: Not on file  Occupational History  . Not on file  Social Needs  . Financial resource strain: Not on file  . Food insecurity:    Worry: Not on file    Inability: Not on file  . Transportation needs:    Medical: Not on file    Non-medical: Not on file  Tobacco Use  . Smoking status: Current Some Day Smoker    Packs/day: 0.50    Years: 40.00    Pack years: 20.00  . Smokeless tobacco: Never Used  Substance and Sexual Activity  . Alcohol use: No  . Drug use: No  . Sexual activity: Not on file  Lifestyle  . Physical activity:    Days per week: Not on file    Minutes per session: Not on file  . Stress: Not on file  Relationships  . Social connections:    Talks on phone: Not on file    Gets together: Not on file    Attends religious service: Not on file    Active member of club or organization: Not on file    Attends meetings of clubs or organizations: Not on file    Relationship status: Not on file  . Intimate partner violence:    Fear of current or ex partner: Not on file    Emotionally abused: Not on file    Physically abused: Not on file    Forced sexual activity: Not on file  Other Topics Concern  . Not on file  Social History Narrative  . Not on file     Review of Systems: General: negative for chills, fever, night sweats or weight changes.  Cardiovascular: negative for chest pain, dyspnea on exertion, edema, orthopnea, palpitations, paroxysmal nocturnal dyspnea or shortness of breath Dermatological: negative for rash Respiratory: negative for cough or wheezing Urologic: negative for hematuria Abdominal: negative for nausea, vomiting, diarrhea, bright red blood per rectum, melena, or hematemesis Neurologic: negative for  visual changes, syncope, or dizziness All other systems reviewed and are otherwise negative except as noted above.   Physical Exam:  Blood pressure (!) 110/56, pulse 65, height 5\' 5"  (1.651 m), weight 151 lb (68.5 kg), SpO2 96 %.  General appearance: alert, cooperative and no distress Neck: no carotid bruit and no JVD Lungs: clear to auscultation bilaterally Heart: regular rate and rhythm, S1, S2 normal, no murmur, click, rub or gallop Extremities: extremities normal, atraumatic, no cyanosis or edema Pulses: 2+ and symmetric Skin: Skin color, texture, turgor normal. No rashes or lesions Neurologic: Grossly normal  EKG performed, showed SB, 1.AVB, NSIVCD, unchanged from prior  -- personally reviewed   Echo: 09/19/2018 - Left ventricle: The cavity size was severely dilated. Wall   thickness was increased in a pattern of moderate LVH. Systolic   function was severely reduced. The estimated ejection fraction   was in  the range of 20% to 25%. Dyskinesis of the   entireanteroseptal myocardium. Features are consistent with a   pseudonormal left ventricular filling pattern, with concomitant   abnormal relaxation and increased filling pressure (grade 2   diastolic dysfunction). - Aortic valve: There was mild regurgitation. - Mitral valve: The findings are consistent with mild stenosis.   There was moderate regurgitation. Valve area by pressure   half-time: 2.2 cm^2. Valve area by continuity equation (using   LVOT flow): 1.47 cm^2. - Left atrium: The atrium was moderately to severely dilated. - Right atrium: The atrium was mildly dilated.   ASSESSMENT AND PLAN:   1. NICM: normal cath in 03/2018, LVEF 20-25%, repeat echocardiogram showed LVEF of 20 to 25%, functional class IIb, we will continue Entresto 26/24 mg, metoprolol 12.5 p.o. daily.  Blood pressure too low to add Aldactone.  HR 50 BPM. -We will refer to EP for consideration of by BiV ICD placement as he also has left bundle branch  block with QRS width of 152 ms. -We will check BMP and BNP today as he was just started on Entresto.  2. Chronic combined systolic and diastolic CHF - as above, the patient is euvolemic.  3. Hypertension - controlled  4.  Hyperlipidemia -continue atorvastatin 40 mg daily.  Follow-Up w/ Dr. Meda Norris in 3 months.  We will repeat an echocardiogram at that time.  We will refer to EP for by BiV ICD placement.  Ena Dawley, MD [facility] 09/29/2018 11:01 AM

## 2018-09-29 NOTE — Telephone Encounter (Signed)
Notified the pt that per Dr Meda Coffee, his labs showed high BNP, normal kidney function and electrolytes, so she recommends that he start taking spironolactone 12.5 mg po daily.  Confirmed the pharmacy of choice with the pt.  Pt education provided on how to take this med.  Pt verbalized understanding and agrees with this plan.

## 2018-10-05 ENCOUNTER — Telehealth: Payer: Self-pay | Admitting: Cardiology

## 2018-10-05 NOTE — Telephone Encounter (Signed)
Voicemail left with [facility] rep asked if EF% Form could be faxed over to obtain records.

## 2018-10-05 NOTE — Telephone Encounter (Signed)
New Message          Shawn calling from [facility] is needing the patient EF percent. Pls call and advise.

## 2018-10-05 NOTE — Telephone Encounter (Signed)
Will forward this call to our Medical Records Dept for further follow-up with [facility], for HIPPA compliance.

## 2018-10-24 ENCOUNTER — Encounter: Payer: Self-pay | Admitting: Internal Medicine

## 2018-10-24 ENCOUNTER — Ambulatory Visit (INDEPENDENT_AMBULATORY_CARE_PROVIDER_SITE_OTHER): Payer: Medicare Other | Admitting: Internal Medicine

## 2018-10-24 VITALS — BP 116/60 | HR 66 | Ht 65.0 in | Wt 149.2 lb

## 2018-10-24 DIAGNOSIS — I428 Other cardiomyopathies: Secondary | ICD-10-CM | POA: Diagnosis not present

## 2018-10-24 DIAGNOSIS — I447 Left bundle-branch block, unspecified: Secondary | ICD-10-CM

## 2018-10-24 LAB — CBC WITH DIFFERENTIAL/PLATELET
BASOS ABS: 0 10*3/uL (ref 0.0–0.2)
BASOS: 0 %
EOS (ABSOLUTE): 0 10*3/uL (ref 0.0–0.4)
EOS: 0 %
HEMATOCRIT: 43.6 % (ref 37.5–51.0)
HEMOGLOBIN: 14.8 g/dL (ref 13.0–17.7)
IMMATURE GRANS (ABS): 0 10*3/uL (ref 0.0–0.1)
Immature Granulocytes: 0 %
LYMPHS ABS: 2 10*3/uL (ref 0.7–3.1)
LYMPHS: 18 %
MCH: 29 pg (ref 26.6–33.0)
MCHC: 33.9 g/dL (ref 31.5–35.7)
MCV: 85 fL (ref 79–97)
MONOCYTES: 7 %
Monocytes Absolute: 0.7 10*3/uL (ref 0.1–0.9)
NEUTROS ABS: 8.2 10*3/uL — AB (ref 1.4–7.0)
Neutrophils: 75 %
Platelets: 301 10*3/uL (ref 150–450)
RBC: 5.11 x10E6/uL (ref 4.14–5.80)
RDW: 13.1 % (ref 12.3–15.4)
WBC: 11 10*3/uL — ABNORMAL HIGH (ref 3.4–10.8)

## 2018-10-24 LAB — BASIC METABOLIC PANEL
BUN / CREAT RATIO: 11 (ref 10–24)
BUN: 12 mg/dL (ref 8–27)
CHLORIDE: 104 mmol/L (ref 96–106)
CO2: 23 mmol/L (ref 20–29)
Calcium: 9.6 mg/dL (ref 8.6–10.2)
Creatinine, Ser: 1.07 mg/dL (ref 0.76–1.27)
GFR, EST AFRICAN AMERICAN: 83 mL/min/{1.73_m2} (ref >59)
GFR, EST NON AFRICAN AMERICAN: 71 mL/min/{1.73_m2} (ref >59)
GLUCOSE: 81 mg/dL (ref 65–99)
Potassium: 4.7 mmol/L (ref 3.5–5.2)
Sodium: 142 mmol/L (ref 134–144)

## 2018-10-24 NOTE — Progress Notes (Signed)
Electrophysiology Office Note   Date:  10/24/2018   ID:  PRICE LACHAPELLE, DOB [birth year], MRN [id number]  PCP:  Default, Provider, MD  Cardiologist:  Dr Meda Coffee Primary Electrophysiologist: Thompson Grayer, MD    CC: CHF   History of Present Illness: [facility] is a 67 y.o. male who presents today for electrophysiology evaluation.   He is referred by Dr Meda Coffee for further risk stratification of sudden death.  He initially presented 4/19 with chest pain.  Cath revealed mild nonobstructive CAD.  He was noted to have EF 20%.  He was treated with an optimal medical strategy.  His EF remains 20% by echo 09/19/18. He has fatigue and poor exercise tolerance.  He is SOB with walking one block.    Today, he denies symptoms of palpitations, further chest pain, orthopnea, PND, lower extremity edema, claudication, dizziness, presyncope, syncope, bleeding, or neurologic sequela. The patient is tolerating medications without difficulties and is otherwise without complaint today.    Past Medical History:  Diagnosis Date  . Acute renal insufficiency   . Anginal pain ([place])   . Hypertension   . Myocardial infarction ([place])   . Systolic heart failure ([place])   . Tobacco use    Past Surgical History:  Procedure Laterality Date  . CARDIAC CATHETERIZATION    . KNEE SURGERY    . LEFT HEART CATH AND CORONARY ANGIOGRAPHY N/A 03/30/2018   Procedure: LEFT HEART CATH AND CORONARY ANGIOGRAPHY;  Surgeon: Burnell Blanks, MD;  Location: [place] [facility];  Service: Cardiovascular;  Laterality: N/A;     Current Outpatient Medications  Medication Sig Dispense Refill  . aspirin EC 81 MG EC tablet Take 1 tablet (81 mg total) by mouth daily.    Marland Kitchen atorvastatin (LIPITOR) 40 MG tablet Take 1 tablet (40 mg total) by mouth daily. 90 tablet 2  . Multiple Vitamin (MULTIVITAMIN WITH MINERALS) TABS tablet Take 1 tablet by mouth daily.    . sacubitril-valsartan (ENTRESTO) 24-26 MG Take 1 tablet by mouth 2 (two) times  daily. 180 tablet 2  . spironolactone (ALDACTONE) 25 MG tablet Take 0.5 tablets (12.5 mg total) by mouth daily. 30 tablet 3  . nitroGLYCERIN (NITROSTAT) 0.4 MG SL tablet Place 1 tablet (0.4 mg total) under the tongue every 5 (five) minutes x 3 doses as needed for chest pain. 30 tablet 12   No current facility-administered medications for this visit.     Allergies:   Patient has no known allergies.   Social History:  The patient  reports that he has been smoking. He has a 20.00 pack-year smoking history. He has never used smokeless tobacco. He reports that he does not drink alcohol or use drugs.   Family History:  [name]  family history includes CAD in his brother.    ROS:  Please see the history of present illness.   All other systems are personally reviewed and negative.    PHYSICAL EXAM: VS:  BP 116/60   Pulse 66   Ht 5\' 5"  (1.651 m)   Wt 149 lb 3.2 oz (67.7 kg)   SpO2 98%   BMI 24.83 kg/m  , BMI Body mass index is 24.83 kg/m. GEN: Well nourished, well developed, in no acute distress  HEENT: normal  Neck: no JVD, carotid bruits, or masses Cardiac: RRR; no murmurs, rubs, or gallops,no edema  Respiratory:  clear to auscultation bilaterally, normal work of breathing GI: soft, nontender, nondistended, + BS MS: no deformity or atrophy  Skin: warm and dry  Neuro:  Strength and sensation are intact Psych: euthymic mood, full affect  EKG:  EKG is ordered today. The ekg ordered today is personally reviewed and shows sinus rhythm with LBBB (QRS 150 msec)   Recent Labs: 03/29/2018: B Natriuretic Peptide 286.2 03/31/2018: Hemoglobin 13.1; Platelets 210 09/29/2018: BUN 11; Creatinine, Ser 0.94; NT-Pro BNP 2,041; Potassium 4.4; Sodium 143  personally reviewed   Lipid Panel     Component Value Date/Time   CHOL 114 03/30/2018 0006   TRIG 65 03/30/2018 0006   HDL 24 (L) 03/30/2018 0006   CHOLHDL 4.8 03/30/2018 0006   VLDL 13 03/30/2018 0006   LDLCALC 77 03/30/2018 0006    personally reviewed   Wt Readings from Last 3 Encounters:  10/24/18 149 lb 3.2 oz (67.7 kg)  09/29/18 151 lb (68.5 kg)  09/01/18 155 lb (70.3 kg)      Other studies personally reviewed: Additional studies/ records that were reviewed today include: Dr Francesca Oman notes, prior echos and ekgs  Review of the above records today demonstrates: as above   ASSESSMENT AND PLAN:  1.  Nonischemic CM The patient has a nonischemic CM (EF 20%), NYHA Class III CHF, and LBBB with QRS > 150 msec.  He is referred by Dr Meda Coffee for risk stratification of sudden death and consideration of BiV ICD implantation.  At this time, he meets MADIT II/ SCD-HeFT criteria for ICD implantation for primary prevention of sudden death.  I have had a thorough discussion with the patient reviewing options.  The patient has had opportunities to ask questions and have them answered. The patient and I have decided together through a shared decision making process to proceed with a BiV ICD at this time.   Risks, benefits, alternatives to BiV ICD implantation were discussed in detail with the patient today. The patient understands that the risks include but are not limited to bleeding, infection, pneumothorax, perforation, tamponade, vascular damage, renal failure, MI, stroke, death, inappropriate shocks, and lead dislodgement and wishes to proceed.  We will therefore schedule device implantation at the next available time.  2. HTN Stable No change required today   Current medicines are reviewed at length with the patient today.   The patient does not have concerns regarding his medicines.  The following changes were made today:  none    Signed, Thompson Grayer, MD  10/24/2018 8:49 AM     [facility] [address] [place] [address] [phone] (office) [phone] (fax)

## 2018-10-24 NOTE — H&P (View-Only) (Signed)
Electrophysiology Office Note   Date:  10/24/2018   ID:  J[facility], DOB 05/14/1951, MRN [id number]  PCP:  Default, Provider, MD  Cardiologist:  Dr Meda Coffee Primary Electrophysiologist: Thompson Grayer, MD    CC: CHF   History of Present Illness: [facility] is a 67 y.o. male who presents today for electrophysiology evaluation.   He is referred by Dr Meda Coffee for further risk stratification of sudden death.  He initially presented 4/19 with chest pain.  Cath revealed mild nonobstructive CAD.  He was noted to have EF 20%.  He was treated with an optimal medical strategy.  His EF remains 20% by echo 09/19/18. He has fatigue and poor exercise tolerance.  He is SOB with walking one block.    Today, he denies symptoms of palpitations, further chest pain, orthopnea, PND, lower extremity edema, claudication, dizziness, presyncope, syncope, bleeding, or neurologic sequela. The patient is tolerating medications without difficulties and is otherwise without complaint today.    Past Medical History:  Diagnosis Date  . Acute renal insufficiency   . Anginal pain ([place])   . Hypertension   . Myocardial infarction ([place])   . Systolic heart failure ([place])   . Tobacco use    Past Surgical History:  Procedure Laterality Date  . CARDIAC CATHETERIZATION    . KNEE SURGERY    . LEFT HEART CATH AND CORONARY ANGIOGRAPHY N/A 03/30/2018   Procedure: LEFT HEART CATH AND CORONARY ANGIOGRAPHY;  Surgeon: Burnell Blanks, MD;  Location: [place] [facility];  Service: Cardiovascular;  Laterality: N/A;     Current Outpatient Medications  Medication Sig Dispense Refill  . aspirin EC 81 MG EC tablet Take 1 tablet (81 mg total) by mouth daily.    Marland Kitchen atorvastatin (LIPITOR) 40 MG tablet Take 1 tablet (40 mg total) by mouth daily. 90 tablet 2  . Multiple Vitamin (MULTIVITAMIN WITH MINERALS) TABS tablet Take 1 tablet by mouth daily.    . sacubitril-valsartan (ENTRESTO) 24-26 MG Take 1 tablet by mouth 2 (two) times  daily. 180 tablet 2  . spironolactone (ALDACTONE) 25 MG tablet Take 0.5 tablets (12.5 mg total) by mouth daily. 30 tablet 3  . nitroGLYCERIN (NITROSTAT) 0.4 MG SL tablet Place 1 tablet (0.4 mg total) under the tongue every 5 (five) minutes x 3 doses as needed for chest pain. 30 tablet 12   No current facility-administered medications for this visit.     Allergies:   Patient has no known allergies.   Social History:  The patient  reports that he has been smoking. He has a 20.00 pack-year smoking history. He has never used smokeless tobacco. He reports that he does not drink alcohol or use drugs.   Family History:  [name]  family history includes CAD in his brother.    ROS:  Please see the history of present illness.   All other systems are personally reviewed and negative.    PHYSICAL EXAM: VS:  BP 116/60   Pulse 66   Ht 5\' 5"  (1.651 m)   Wt 149 lb 3.2 oz (67.7 kg)   SpO2 98%   BMI 24.83 kg/m  , BMI Body mass index is 24.83 kg/m. GEN: Well nourished, well developed, in no acute distress  HEENT: normal  Neck: no JVD, carotid bruits, or masses Cardiac: RRR; no murmurs, rubs, or gallops,no edema  Respiratory:  clear to auscultation bilaterally, normal work of breathing GI: soft, nontender, nondistended, + BS MS: no deformity or atrophy  Skin: warm and dry  Neuro:  Strength and sensation are intact Psych: euthymic mood, full affect  EKG:  EKG is ordered today. The ekg ordered today is personally reviewed and shows sinus rhythm with LBBB (QRS 150 msec)   Recent Labs: 03/29/2018: B Natriuretic Peptide 286.2 03/31/2018: Hemoglobin 13.1; Platelets 210 09/29/2018: BUN 11; Creatinine, Ser 0.94; NT-Pro BNP 2,041; Potassium 4.4; Sodium 143  personally reviewed   Lipid Panel     Component Value Date/Time   CHOL 114 03/30/2018 0006   TRIG 65 03/30/2018 0006   HDL 24 (L) 03/30/2018 0006   CHOLHDL 4.8 03/30/2018 0006   VLDL 13 03/30/2018 0006   LDLCALC 77 03/30/2018 0006    personally reviewed   Wt Readings from Last 3 Encounters:  10/24/18 149 lb 3.2 oz (67.7 kg)  09/29/18 151 lb (68.5 kg)  09/01/18 155 lb (70.3 kg)      Other studies personally reviewed: Additional studies/ records that were reviewed today include: Dr Francesca Oman notes, prior echos and ekgs  Review of the above records today demonstrates: as above   ASSESSMENT AND PLAN:  1.  Nonischemic CM The patient has a nonischemic CM (EF 20%), NYHA Class III CHF, and LBBB with QRS > 150 msec.  He is referred by Dr Meda Coffee for risk stratification of sudden death and consideration of BiV ICD implantation.  At this time, he meets MADIT II/ SCD-HeFT criteria for ICD implantation for primary prevention of sudden death.  I have had a thorough discussion with the patient reviewing options.  The patient has had opportunities to ask questions and have them answered. The patient and I have decided together through a shared decision making process to proceed with a BiV ICD at this time.   Risks, benefits, alternatives to BiV ICD implantation were discussed in detail with the patient today. The patient understands that the risks include but are not limited to bleeding, infection, pneumothorax, perforation, tamponade, vascular damage, renal failure, MI, stroke, death, inappropriate shocks, and lead dislodgement and wishes to proceed.  We will therefore schedule device implantation at the next available time.  2. HTN Stable No change required today   Current medicines are reviewed at length with the patient today.   The patient does not have concerns regarding his medicines.  The following changes were made today:  none    Signed, Thompson Grayer, MD  10/24/2018 8:49 AM     [facility] [address] [place] [address] [phone] (office) [phone] (fax)

## 2018-10-24 NOTE — Patient Instructions (Addendum)
Medication Instructions:  Your physician recommends that you continue on your current medications as directed. Please refer to the Current Medication list given to you today.  Labwork: You will get lab work today:  BMP and CBC.  Testing/Procedures: Your physician has recommended that you have a defibrillator inserted. An implantable cardioverter defibrillator (ICD) is a small device that is placed in your chest or, in rare cases, your abdomen. This device uses electrical pulses or shocks to help control life-threatening, irregular heartbeats that could lead the heart to suddenly stop beating (sudden cardiac arrest). Leads are attached to the ICD that goes into your heart. This is done in the hospital and usually requires an overnight stay. Please see the instruction sheet given to you today for more information.   Follow-Up: You will follow up with device clinic 10-14 days after your procedure for a wound check.  You will follow up with Dr. Rayann Heman 91 days after your procedure.   DEFIBRILLATOR INSTRUCTIONS:  Please arrive to ADMITTING down the hall from the [facility] main entrance of [place] hospital at:  8:30 am on November 09, 2018 Use the CHG surgical scrub as directed Do not eat or drink after midnight prior to procedure Do not take any medications the morning of the procedure Plan for one night stay You will need someone to drive you home at discharge  If you need a refill on your cardiac medications before your next appointment, please call your pharmacy.    Cardioverter Defibrillator Implantation An implantable cardioverter defibrillator (ICD) is a small device that is placed under the skin in the chest or abdomen. An ICD consists of a battery, a small computer (pulse generator), and wires (leads) that go into the heart. An ICD is used to detect and correct two types of dangerous irregular heartbeats (arrhythmias):  A rapid heart rhythm (tachycardia).  An arrhythmia in  which the lower chambers of the heart (ventricles) contract in an uncoordinated way (fibrillation).  When an ICD detects tachycardia, it sends a low-energy shock to the heart to restore the heartbeat to normal (cardioversion). This signal is usually painless. If cardioversion does not work or if the ICD detects fibrillation, it delivers a high-energy shock to the heart (defibrillation) to restart the heart. This shock may feel like a strong jolt in the chest. Your health care provider may prescribe an ICD if:  You have had an arrhythmia that originated in the ventricles.  Your heart has been damaged by a disease or heart condition.  Sometimes, ICDs are programmed to act as a device called a pacemaker. Pacemakers can be used to treat a slow heartbeat (bradycardia) or tachycardia by taking over the heart rate with electrical impulses. Tell a health care provider about:  Any allergies you have.  All medicines you are taking, including vitamins, herbs, eye drops, creams, and over-the-counter medicines.  Any problems you or family members have had with anesthetic medicines.  Any blood disorders you have.  Any surgeries you have had.  Any medical conditions you have.  Whether you are pregnant or may be pregnant. What are the risks? Generally, this is a safe procedure. However, problems may occur, including:  Swelling, bleeding, or bruising.  Infection.  Blood clots.  Damage to other structures or organs, such as nerves, blood vessels, or the heart.  Allergic reactions to medicines used during the procedure.  What happens before the procedure? Staying hydrated Follow instructions from your health care provider about hydration, which may include:  Up to 2 hours before the procedure - you may continue to drink clear liquids, such as water, clear fruit juice, black coffee, and plain tea.  Eating and drinking restrictions Follow instructions from your health care provider about  eating and drinking, which may include:  8 hours before the procedure - stop eating heavy meals or foods such as meat, fried foods, or fatty foods.  6 hours before the procedure - stop eating light meals or foods, such as toast or cereal.  6 hours before the procedure - stop drinking milk or drinks that contain milk.  2 hours before the procedure - stop drinking clear liquids.  Medicine Ask your health care provider about:  Changing or stopping your normal medicines. This is important if you take diabetes medicines or blood thinners.  Taking medicines such as aspirin and ibuprofen. These medicines can thin your blood. Do not take these medicines before your procedure if your doctor tells you not to.  Tests  You may have blood tests.  You may have a test to check the electrical signals in your heart (electrocardiogram, ECG).  You may have imaging tests, such as a chest X-ray. General instructions  For 24 hours before the procedure, stop using products that contain nicotine or tobacco, such as cigarettes and e-cigarettes. If you need help quitting, ask your health care provider.  Plan to have someone take you home from the hospital or clinic.  You may be asked to shower with a germ-killing soap. What happens during the procedure?  To reduce your risk of infection: ? Your health care team will wash or sanitize their hands. ? Your skin will be washed with soap. ? Hair may be removed from the surgical area.  Small monitors will be put on your body. They will be used to check your heart, blood pressure, and oxygen level.  An IV tube will be inserted into one of your veins.  You will be given one or more of the following: ? A medicine to help you relax (sedative). ? A medicine to numb the area (local anesthetic). ? A medicine to make you fall asleep (general anesthetic).  Leads will be guided through a blood vessel into your heart and attached to your heart muscles. Depending  on the ICD, the leads may go into one ventricle or they may go into both ventricles and into an upper chamber of the heart. An X-ray machine (fluoroscope) will be usedto help guide the leads.  A small incision will be made to create a deep pocket under your skin.  The pulse generator will be placed into the pocket.  The ICD will be tested.  The incision will be closed with stitches (sutures), skin glue, or staples.  A bandage (dressing) will be placed over the incision. This procedure may vary among health care providers and hospitals. What happens after the procedure?  Your blood pressure, heart rate, breathing rate, and blood oxygen level will be monitored often until the medicines you were given have worn off.  A chest X-ray will be taken to check that the ICD is in the right place.  You will need to stay in the hospital for 1-2 days so your health care provider can make sure your ICD is working.  Do not drive for 24 hours if you received a sedative. Ask your health care provider when it is safe for you to drive.  You may be given an identification card explaining that you have an ICD. Summary  An implantable cardioverter defibrillator (ICD) is a small device that is placed under the skin in the chest or abdomen. It is used to detect and correct dangerous irregular heartbeats (arrhythmias).  An ICD consists of a battery, a small computer (pulse generator), and wires (leads) that go into the heart.  When an ICD detects rapid heart rhythm (tachycardia), it sends a low-energy shock to the heart to restore the heartbeat to normal (cardioversion). If cardioversion does not work or if the ICD detects uncoordinated heart contractions (fibrillation), it delivers a high-energy shock to the heart (defibrillation) to restart the heart.  You will need to stay in the hospital for 1-2 days to make sure your ICD is working. This information is not intended to replace advice given to you by your  health care provider. Make sure you discuss any questions you have with your health care provider. Document Released: 08/22/2002 Document Revised: 12/09/2016 Document Reviewed: 12/09/2016 Elsevier Interactive Patient Education  2017 [employer].

## 2018-11-08 ENCOUNTER — Other Ambulatory Visit: Payer: Self-pay

## 2018-11-08 ENCOUNTER — Encounter (HOSPITAL_COMMUNITY): Payer: Self-pay | Admitting: General Practice

## 2018-11-08 ENCOUNTER — Ambulatory Visit (HOSPITAL_COMMUNITY)
Admission: RE | Disposition: A | Discharge status: Home / Self Care | Payer: Self-pay | Source: Ambulatory Visit | Attending: Internal Medicine

## 2018-11-08 ENCOUNTER — Ambulatory Visit (HOSPITAL_COMMUNITY)
Admission: RE | Admit: 2018-11-08 | Discharge: 2018-11-09 | Disposition: A | Discharge status: Home / Self Care | Payer: Medicare Other | Source: Ambulatory Visit | Attending: Internal Medicine | Admitting: Internal Medicine

## 2018-11-08 DIAGNOSIS — Z955 Presence of coronary angioplasty implant and graft: Secondary | ICD-10-CM | POA: Diagnosis not present

## 2018-11-08 DIAGNOSIS — Z23 Encounter for immunization: Secondary | ICD-10-CM | POA: Insufficient documentation

## 2018-11-08 DIAGNOSIS — I447 Left bundle-branch block, unspecified: Secondary | ICD-10-CM | POA: Insufficient documentation

## 2018-11-08 DIAGNOSIS — I2510 Atherosclerotic heart disease of native coronary artery without angina pectoris: Secondary | ICD-10-CM | POA: Diagnosis not present

## 2018-11-08 DIAGNOSIS — I5043 Acute on chronic combined systolic (congestive) and diastolic (congestive) heart failure: Secondary | ICD-10-CM

## 2018-11-08 DIAGNOSIS — Z79899 Other long term (current) drug therapy: Secondary | ICD-10-CM | POA: Diagnosis not present

## 2018-11-08 DIAGNOSIS — Z7982 Long term (current) use of aspirin: Secondary | ICD-10-CM | POA: Diagnosis not present

## 2018-11-08 DIAGNOSIS — Z8249 Family history of ischemic heart disease and other diseases of the circulatory system: Secondary | ICD-10-CM | POA: Diagnosis not present

## 2018-11-08 DIAGNOSIS — Z006 Encounter for examination for normal comparison and control in clinical research program: Secondary | ICD-10-CM | POA: Insufficient documentation

## 2018-11-08 DIAGNOSIS — Z9889 Other specified postprocedural states: Secondary | ICD-10-CM | POA: Diagnosis not present

## 2018-11-08 DIAGNOSIS — I110 Hypertensive heart disease with heart failure: Secondary | ICD-10-CM | POA: Insufficient documentation

## 2018-11-08 DIAGNOSIS — F17200 Nicotine dependence, unspecified, uncomplicated: Secondary | ICD-10-CM | POA: Insufficient documentation

## 2018-11-08 DIAGNOSIS — I428 Other cardiomyopathies: Secondary | ICD-10-CM | POA: Diagnosis present

## 2018-11-08 DIAGNOSIS — I5020 Unspecified systolic (congestive) heart failure: Secondary | ICD-10-CM | POA: Diagnosis not present

## 2018-11-08 DIAGNOSIS — Z959 Presence of cardiac and vascular implant and graft, unspecified: Secondary | ICD-10-CM

## 2018-11-08 DIAGNOSIS — I252 Old myocardial infarction: Secondary | ICD-10-CM | POA: Insufficient documentation

## 2018-11-08 DIAGNOSIS — I5041 Acute combined systolic (congestive) and diastolic (congestive) heart failure: Secondary | ICD-10-CM

## 2018-11-08 DIAGNOSIS — I519 Heart disease, unspecified: Secondary | ICD-10-CM | POA: Diagnosis present

## 2018-11-08 HISTORY — DX: Low back pain: M54.5

## 2018-11-08 HISTORY — DX: Other chronic pain: G89.29

## 2018-11-08 HISTORY — DX: Low back pain, unspecified: M54.50

## 2018-11-08 HISTORY — DX: Pure hypercholesterolemia, unspecified: E78.00

## 2018-11-08 HISTORY — DX: Personal history of urinary calculi: Z87.442

## 2018-11-08 HISTORY — PX: BIV ICD INSERTION CRT-D: EP1195

## 2018-11-08 LAB — SURGICAL PCR SCREEN
MRSA, PCR: NEGATIVE
STAPHYLOCOCCUS AUREUS: NEGATIVE

## 2018-11-08 SURGERY — BIV ICD INSERTION CRT-D

## 2018-11-08 MED ORDER — INFLUENZA VAC SPLIT HIGH-DOSE 0.5 ML IM SUSY
0.5000 mL | PREFILLED_SYRINGE | INTRAMUSCULAR | Status: AC
  Administered 2018-11-09: 0.5 mL via INTRAMUSCULAR
  Filled 2018-11-08 (×2): qty 0.5

## 2018-11-08 MED ORDER — MUPIROCIN 2 % EX OINT
TOPICAL_OINTMENT | CUTANEOUS | Status: AC
  Administered 2018-11-08: 1 via NASAL
  Filled 2018-11-08: qty 22

## 2018-11-08 MED ORDER — IOPAMIDOL (ISOVUE-370) INJECTION 76%
INTRAVENOUS | Status: DC | PRN
  Administered 2018-11-08: 15 mL via INTRAVENOUS
  Administered 2018-11-08: 10 mL via INTRAVENOUS

## 2018-11-08 MED ORDER — ACETAMINOPHEN 325 MG PO TABS: 325.0000 mg | ORAL_TABLET | ORAL | Status: DC | PRN

## 2018-11-08 MED ORDER — SODIUM CHLORIDE 0.9% FLUSH
3.0000 mL | Freq: Two times a day (BID) | INTRAVENOUS | Status: DC
  Administered 2018-11-08 – 2018-11-09 (×2): 3 mL via INTRAVENOUS

## 2018-11-08 MED ORDER — SODIUM CHLORIDE 0.9 % IV SOLN
INTRAVENOUS | Status: AC
  Filled 2018-11-08: qty 2

## 2018-11-08 MED ORDER — MIDAZOLAM HCL 5 MG/5ML IJ SOLN
INTRAMUSCULAR | Status: DC | PRN
  Administered 2018-11-08 (×3): 1 mg via INTRAVENOUS

## 2018-11-08 MED ORDER — MIDAZOLAM HCL 5 MG/5ML IJ SOLN
INTRAMUSCULAR | Status: AC
  Filled 2018-11-08: qty 5

## 2018-11-08 MED ORDER — IOPAMIDOL (ISOVUE-370) INJECTION 76%
INTRAVENOUS | Status: AC
  Filled 2018-11-08: qty 50

## 2018-11-08 MED ORDER — CARVEDILOL 6.25 MG PO TABS
6.2500 mg | ORAL_TABLET | Freq: Two times a day (BID) | ORAL | Status: DC
  Administered 2018-11-08 – 2018-11-09 (×2): 6.25 mg via ORAL
  Filled 2018-11-08 (×2): qty 1

## 2018-11-08 MED ORDER — SODIUM CHLORIDE 0.9 % IV SOLN
250.0000 mL | INTRAVENOUS | Status: DC | PRN
  Administered 2018-11-08: 250 mL via INTRAVENOUS

## 2018-11-08 MED ORDER — HEPARIN (PORCINE) IN NACL 1000-0.9 UT/500ML-% IV SOLN
INTRAVENOUS | Status: AC
  Filled 2018-11-08: qty 500

## 2018-11-08 MED ORDER — FENTANYL CITRATE (PF) 100 MCG/2ML IJ SOLN
INTRAMUSCULAR | Status: DC | PRN
  Administered 2018-11-08: 12.5 ug via INTRAVENOUS
  Administered 2018-11-08: 25 ug via INTRAVENOUS

## 2018-11-08 MED ORDER — SPIRONOLACTONE 12.5 MG HALF TABLET
12.5000 mg | ORAL_TABLET | Freq: Every day | ORAL | Status: DC
  Administered 2018-11-08 – 2018-11-09 (×2): 12.5 mg via ORAL
  Filled 2018-11-08 (×2): qty 1

## 2018-11-08 MED ORDER — LIDOCAINE HCL (PF) 1 % IJ SOLN
INTRAMUSCULAR | Status: DC | PRN
  Administered 2018-11-08: 60 mL

## 2018-11-08 MED ORDER — CHLORHEXIDINE GLUCONATE 4 % EX LIQD
60.0000 mL | Freq: Once | CUTANEOUS | Status: DC
  Filled 2018-11-08: qty 60

## 2018-11-08 MED ORDER — CEFAZOLIN SODIUM-DEXTROSE 2-4 GM/100ML-% IV SOLN
2.0000 g | INTRAVENOUS | Status: AC
  Administered 2018-11-08: 2 g via INTRAVENOUS
  Filled 2018-11-08: qty 100

## 2018-11-08 MED ORDER — PNEUMOCOCCAL VAC POLYVALENT 25 MCG/0.5ML IJ INJ
0.5000 mL | INJECTION | INTRAMUSCULAR | Status: AC
  Administered 2018-11-09: 0.5 mL via INTRAMUSCULAR
  Filled 2018-11-08: qty 0.5

## 2018-11-08 MED ORDER — SODIUM CHLORIDE 0.9% FLUSH: 3.0000 mL | INTRAVENOUS | Status: DC | PRN

## 2018-11-08 MED ORDER — LIDOCAINE HCL (PF) 1 % IJ SOLN
INTRAMUSCULAR | Status: AC
  Filled 2018-11-08: qty 60

## 2018-11-08 MED ORDER — FENTANYL CITRATE (PF) 100 MCG/2ML IJ SOLN
INTRAMUSCULAR | Status: AC
  Filled 2018-11-08: qty 2

## 2018-11-08 MED ORDER — SODIUM CHLORIDE 0.9 % IV SOLN
80.0000 mg | INTRAVENOUS | Status: AC
  Administered 2018-11-08: 80 mg
  Filled 2018-11-08: qty 2

## 2018-11-08 MED ORDER — LIDOCAINE HCL (PF) 1 % IJ SOLN
INTRAMUSCULAR | Status: AC
  Filled 2018-11-08: qty 30

## 2018-11-08 MED ORDER — SACUBITRIL-VALSARTAN 24-26 MG PO TABS
1.0000 | ORAL_TABLET | Freq: Two times a day (BID) | ORAL | Status: DC
  Administered 2018-11-08 – 2018-11-09 (×2): 1 via ORAL
  Filled 2018-11-08 (×3): qty 1

## 2018-11-08 MED ORDER — CEFAZOLIN SODIUM-DEXTROSE 1-4 GM/50ML-% IV SOLN
1.0000 g | Freq: Four times a day (QID) | INTRAVENOUS | Status: AC
  Administered 2018-11-08 – 2018-11-09 (×3): 1 g via INTRAVENOUS
  Filled 2018-11-08 (×3): qty 50

## 2018-11-08 MED ORDER — HYDROCODONE-ACETAMINOPHEN 5-325 MG PO TABS
1.0000 | ORAL_TABLET | ORAL | Status: DC | PRN
  Administered 2018-11-08 – 2018-11-09 (×2): 2 via ORAL
  Filled 2018-11-08 (×2): qty 2

## 2018-11-08 MED ORDER — SODIUM CHLORIDE 0.9 % IV SOLN
INTRAVENOUS | Status: DC
  Administered 2018-11-08: 11:00:00 via INTRAVENOUS

## 2018-11-08 MED ORDER — ONDANSETRON HCL 4 MG/2ML IJ SOLN: 4.0000 mg | Freq: Four times a day (QID) | INTRAMUSCULAR | Status: DC | PRN

## 2018-11-08 SURGICAL SUPPLY — 21 items
ADAPTER SEALING SSA-EW-09 (MISCELLANEOUS) ×2 IMPLANT
ADPR INTRO LNG 9FR SL XTD WNG (MISCELLANEOUS) ×2
CABLE SURGICAL S-101-97-12 (CABLE) ×3 IMPLANT
CATH ATTAIN COM SURV 6250V-MB2 (CATHETERS) ×1 IMPLANT
CATH ATTAIN SEL SURV 6248V-90 (CATHETERS) ×1 IMPLANT
CATH HEXAPOLAR DAMATO 6F (CATHETERS) ×1 IMPLANT
CATH RIGHTSITE C315HIS02 (CATHETERS) ×1 IMPLANT
ICD CLARIA MRI DTMA1D4 (ICD Generator) ×1 IMPLANT
LEAD CAPSURE NOVUS 5076-52CM (Lead) ×1 IMPLANT
LEAD SELECT SECURE 3830 383069 (Lead) IMPLANT
LEAD SPRINT QUAT SEC 6935M-62 (Lead) ×1 IMPLANT
PAD PRO RADIOLUCENT 2001M-C (PAD) ×2 IMPLANT
SELECT SECURE 3830 383069 (Lead) ×2 IMPLANT
SHEATH CLASSIC 7F (SHEATH) ×1 IMPLANT
SHEATH CLASSIC 9.5F (SHEATH) ×1 IMPLANT
SHEATH CLASSIC 9F (SHEATH) ×1 IMPLANT
SLITTER 6232ADJ (MISCELLANEOUS) ×1 IMPLANT
TRAY PACEMAKER INSERTION (PACKS) ×2 IMPLANT
WIRE ACUITY WHISPER EDS 4648 (WIRE) ×1 IMPLANT
WIRE HI TORQ VERSACORE-J 145CM (WIRE) ×1 IMPLANT
WIRE MAILMAN 182CM (WIRE) ×1 IMPLANT

## 2018-11-08 NOTE — Interval H&P Note (Signed)
History and Physical Interval Note:  11/08/2018 12:54 PM  Juan Norris  has presented today for surgery, with the diagnosis of cm - left bundle branch block  The various methods of treatment have been discussed with the patient and family. After consideration of risks, benefits and other options for treatment, the patient has consented to  Procedure(s): BIV ICD INSERTION CRT-D (N/A) as a surgical intervention .  [name] history has been reviewed, patient examined, no change in status, stable for surgery.  I have reviewed the patient's chart and labs.  Questions were answered to the patient's satisfaction.     ICD Criteria  Current LVEF:20%. Within 12 months prior to implant: Yes   Heart failure history: Yes, Class III  Cardiomyopathy history: Yes, Non-Ischemic Cardiomyopathy.  Atrial Fibrillation/Atrial Flutter: No.  Ventricular tachycardia history: No.  Cardiac arrest history: No.  History of syndromes with risk of sudden death: No.  Previous ICD: No.  Current ICD indication: Primary  PPM indication: Yes. Pacing type: Ventricular. Greater than 40% RV pacing requirement anticipated. Indication: Sick Sinus Syndrome (BiV ICD)  Class I or II Bradycardia indication present: Yes  Beta Blocker therapy for 3 or more months: No, medical reason. (previously on metoprolol.  Discontinued due to symptomatic bradycardia per patient).  Ace Inhibitor/ARB therapy for 3 or more months: Yes, prescribed.    I have seen Juan Norris is a 67 y.o. malepre-procedural and has been referred by Dr Meda Coffee for consideration of BiV ICD implant for primary prevention of sudden death.  [name] chart has been reviewed and they meet criteria for BiV ICD implant.  I have had a thorough discussion with the patient reviewing options.  The patient has had opportunities to ask questions and have them answered. The patient and I have decided together through the [place] Support Tool  to proceed with BiV  ICD at this time.  Risks, benefits, alternatives to BiV ICD implantation were discussed in detail with the patient today. The patient  understands that the risks include but are not limited to bleeding, infection, pneumothorax, perforation, tamponade, vascular damage, renal failure, MI, stroke, death, inappropriate shocks, and lead dislodgement and wishes to proceed.     Thompson Grayer MD, [facility] 11/08/2018 12:56 PM

## 2018-11-08 NOTE — Discharge Summary (Addendum)
ELECTROPHYSIOLOGY PROCEDURE DISCHARGE SUMMARY    Patient ID: Juan Norris,  MRN: [id number], DOB/AGE: [birth year] 67 y.o.  Admit date: 11/08/2018 Discharge date: 11/09/18  Primary Care Physician: Patient, No Pcp Per  Primary Cardiologist: Dr. Meda Coffee Electrophysiologist: Dr. Rayann Heman  Primary Discharge Diagnosis:  1. NICM  Secondary Discharge Diagnosis:  1. HTN 2. HLD  No Known Allergies   Procedures This Admission:  1.  Implantation of a MDT  CRT-ICD on 11/08/18 by Dr Rayann Heman.  The patient received a Medtronic model [id number] (serial number  [id number]) right atrial lead and a Medtronic model [id number] (serial number [id number]) right ventricular defibrillator lead, 3830-69 (SN [id number]) lead (HIS)  DFT's were deferred at time of implant   There were no immediate post procedure complications. 2.  CXR on 11/09/18 demonstrated no pneumothorax status post device implantation.   Brief HPI: Juan Norris is a 67 y.o. male was referred to electrophysiology in the outpatient setting for consideration of ICD implantation.  Past medical history includes above.  The patient has persistent LV dysfunction despite guideline directed therapy.  Risks, benefits, and alternatives to ICD implantation were reviewed with the patient who wished to proceed.   Hospital Course:  The patient was admitted and underwent implantation of an ICD with details as outlined above. He was monitored on telemetry overnight which demonstrated SR/VP.  Left chest was without hematoma or ecchymosis.  The device was interrogated and found to be functioning normally.  CXR was obtained and demonstrated no pneumothorax status post device implantation.  Wound care, arm mobility, and restrictions were reviewed with the patient.  The patient feels well this morning, no CP or SOB, minimal site discomfort.  He was examined by Dr. Rayann Heman and considered stable for discharge to home.   [name] discharge medications include an ARB  (Entresto) and beta blocker (carvedilol).    Physical Exam: Vitals:   11/09/18 0018 11/09/18 0500 11/09/18 0756 11/09/18 0836  BP: (!) 117/59 140/62  (!) 132/53  Pulse: (!) 59 66  60  Resp: 18 18  18   Temp: 98 F (36.7 C) (!) 97.4 F (36.3 C)    TempSrc: Oral Oral    SpO2: 96% 97%    Weight:   66.7 kg   Height:        GEN- The patient is well appearing, alert and oriented x 3 today.   HEENT: normocephalic, atraumatic; sclera clear, conjunctiva pink; hearing intact; oropharynx clear Lungs- CTA b/l, normal work of breathing.  No wheezes, rales, rhonchi Heart- RRR, no murmurs, rubs or gallops, PMI not laterally displaced GI- soft, non-tender, non-distended Extremities- no clubbing, cyanosis, or edema MS- no significant deformity or atrophy Skin- warm and dry, no rash or lesion, left chest without hematoma/ecchymosis Psych- euthymic mood, full affect Neuro- no gross defecits  Labs:   Lab Results  Component Value Date   WBC 11.0 (H) 10/24/2018   HGB 14.8 10/24/2018   HCT 43.6 10/24/2018   MCV 85 10/24/2018   PLT 301 10/24/2018   No results for input(s): NA, K, CL, CO2, BUN, CREATININE, CALCIUM, PROT, BILITOT, ALKPHOS, ALT, AST, GLUCOSE in the last 168 hours.  Invalid input(s): LABALBU  Discharge Medications:  Allergies as of 11/09/2018   No Known Allergies     Medication List    TAKE these medications   aspirin 81 MG EC tablet Take 1 tablet (81 mg total) by mouth daily.   atorvastatin 40 MG tablet Commonly known as:  LIPITOR Take 1  tablet (40 mg total) by mouth daily.   carvedilol 6.25 MG tablet Commonly known as:  COREG Take 1 tablet (6.25 mg total) by mouth 2 (two) times daily with a meal.   multivitamin with minerals Tabs tablet Take 1 tablet by mouth daily.   nitroGLYCERIN 0.4 MG SL tablet Commonly known as:  NITROSTAT Place 1 tablet (0.4 mg total) under the tongue every 5 (five) minutes x 3 doses as needed for chest pain.   sacubitril-valsartan  24-26 MG Commonly known as:  ENTRESTO Take 1 tablet by mouth 2 (two) times daily.   spironolactone 25 MG tablet Commonly known as:  ALDACTONE Take 0.5 tablets (12.5 mg total) by mouth daily.       Disposition:  Home  Discharge Instructions    Diet - low sodium heart healthy   Complete by:  As directed    Increase activity slowly   Complete by:  As directed      Follow-up Information    [facility] Follow up.   Specialty:  Cardiology Why:  11/23/18 @ 10:00AM, wound check visit Contact information: [address] [place]       Thompson Grayer, MD Follow up.   Specialty:  Cardiology Why:  02/13/19 @ 9:45AM Contact information: [place] [address] [phone]           Duration of Discharge Encounter: Greater than 30 minutes including physician time.  Signed, Tommye Standard, PA-C 11/09/2018 9:11 AM  I have seen, examined the patient, and reviewed the above assessment and plan.  Changes to above are made where necessary.  On exam, RRR.  CXR reveals stable leads, no ptx.  Device interrogation is reviewed and normal.  Previously intolerant to beta blockers due to bradycardia.  Will start on coreg now s/p BiV ICD   Co Sign: Thompson Grayer, MD 11/09/2018

## 2018-11-08 NOTE — Progress Notes (Signed)
Pt admitted to the unit from cath lab. Pt A&O x4; LUE remains in sling; left chest incision dsg remains clean, dry and intact with no stain or active drainage noted. Pt educated on LUE restrictions and he voices understanding. Skin clean, dry and intact with no opened wounds or pressure ulcer noted except left chest ICD incision site. Pt denies any pain; pt oriented to the unit and room; fall/safety precaution and prevention education completed. Pt in bed with call light within reach. Will continue to closely monitor. Juan Heady RN    11/08/18 1631  Vitals  Temp 97.9 F (36.6 C)  Temp Source Oral  BP (!) 135/51  MAP (mmHg) 76  BP Location Right Arm  BP Method Automatic  Patient Position (if appropriate) Lying  Pulse Rate (!) 59  Resp 18  Oxygen Therapy  SpO2 97 %  O2 Device Room Air  Height and Weight  Height 5\' 5"  (1.651 m)  Weight 68.2 kg  Type of Scale Used Bed  BSA (Calculated - sq m) 1.77 sq meters  BMI (Calculated) 25.02  Weight in (lb) to have BMI = 25 149.9

## 2018-11-09 ENCOUNTER — Ambulatory Visit (HOSPITAL_COMMUNITY): Payer: Medicare Other

## 2018-11-09 ENCOUNTER — Encounter (HOSPITAL_COMMUNITY): Payer: Self-pay | Admitting: Internal Medicine

## 2018-11-09 DIAGNOSIS — I428 Other cardiomyopathies: Secondary | ICD-10-CM | POA: Diagnosis not present

## 2018-11-09 DIAGNOSIS — Z006 Encounter for examination for normal comparison and control in clinical research program: Secondary | ICD-10-CM | POA: Diagnosis not present

## 2018-11-09 DIAGNOSIS — Z23 Encounter for immunization: Secondary | ICD-10-CM | POA: Diagnosis not present

## 2018-11-09 DIAGNOSIS — I447 Left bundle-branch block, unspecified: Secondary | ICD-10-CM | POA: Diagnosis not present

## 2018-11-09 IMAGING — CR DG CHEST 2V
2 series · 2 of 2 positions shown · non-contrast
Comparison: [DATE]

CLINICAL DATA: Follow-up defibrillator

EXAM:
CHEST - 2 VIEW

[chest pa]
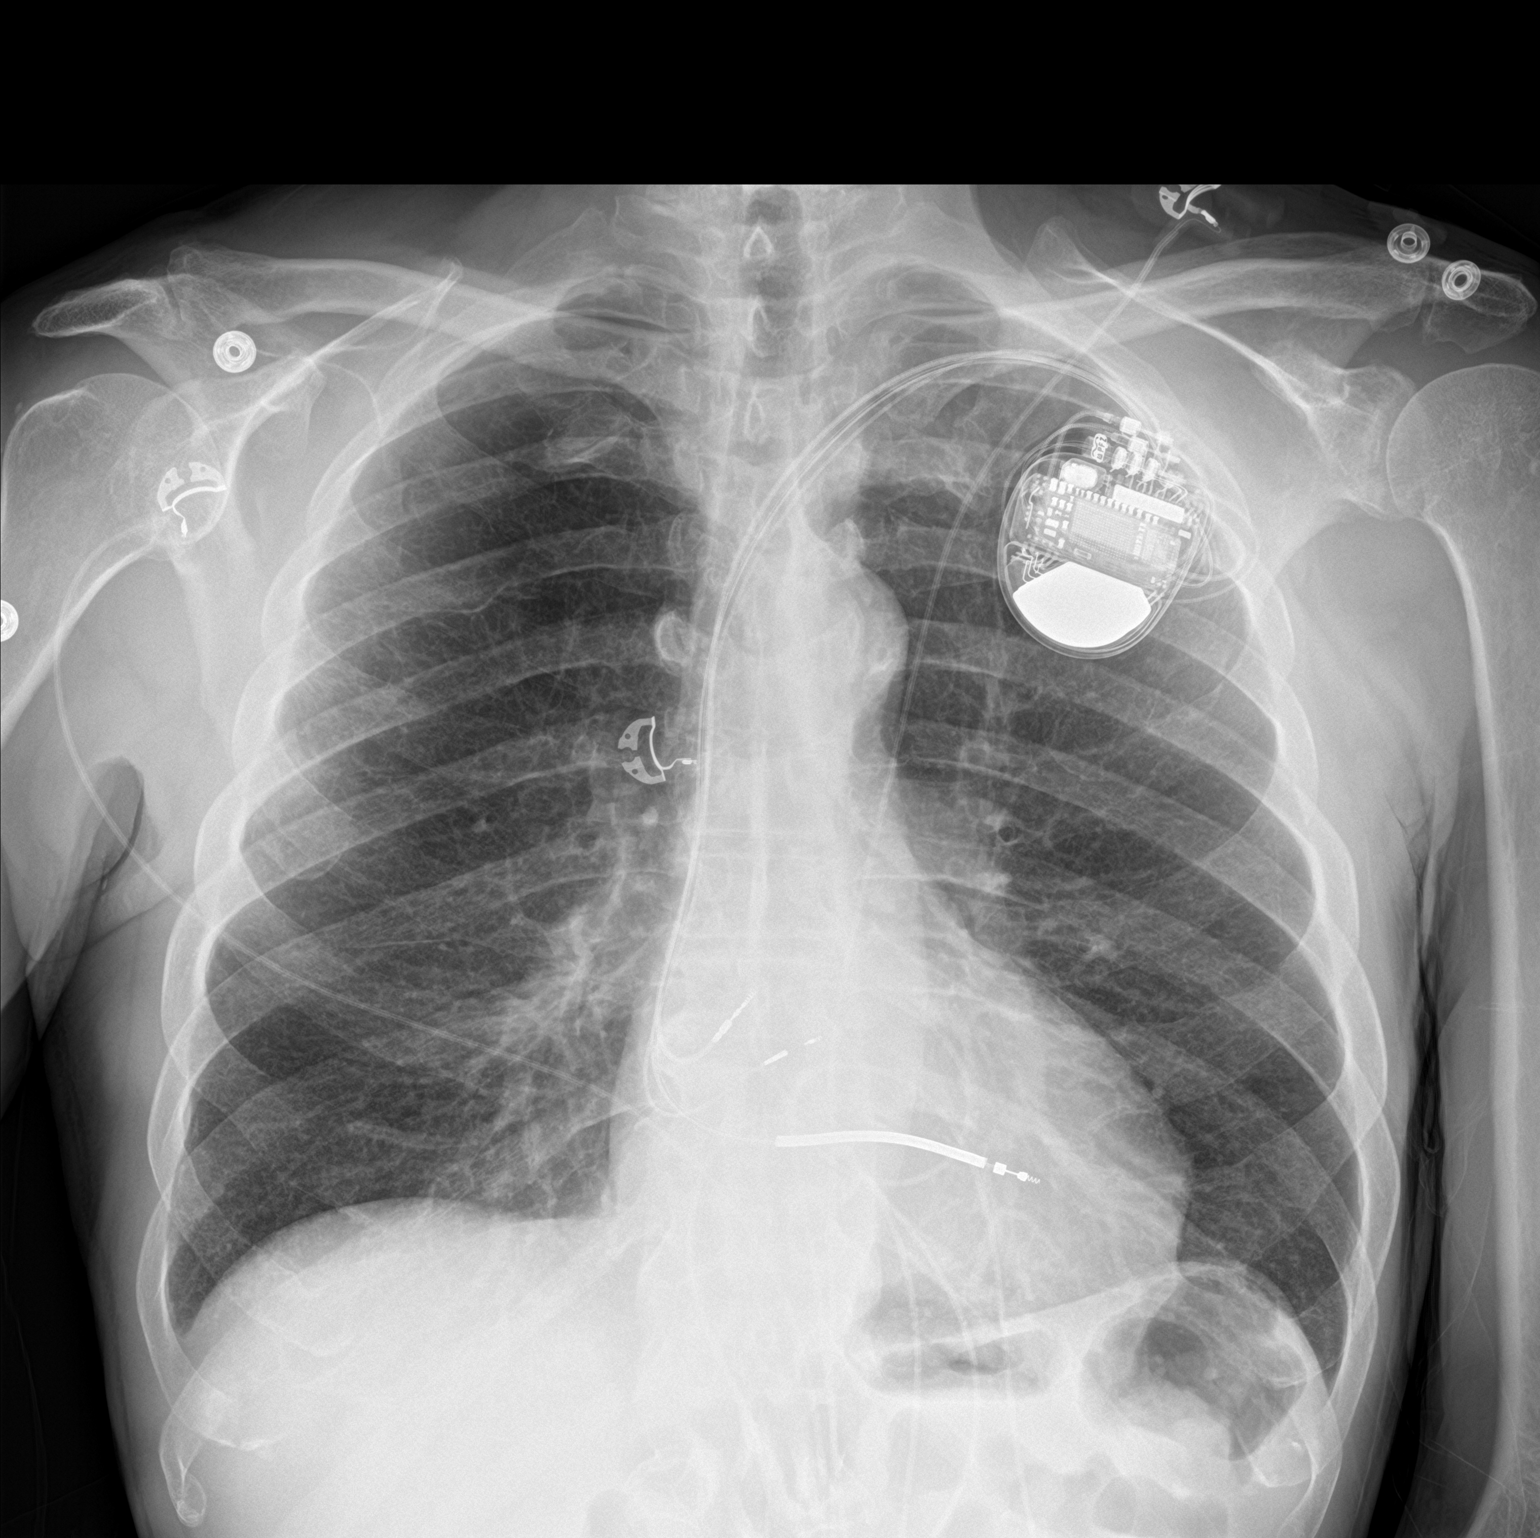

[chest lat]
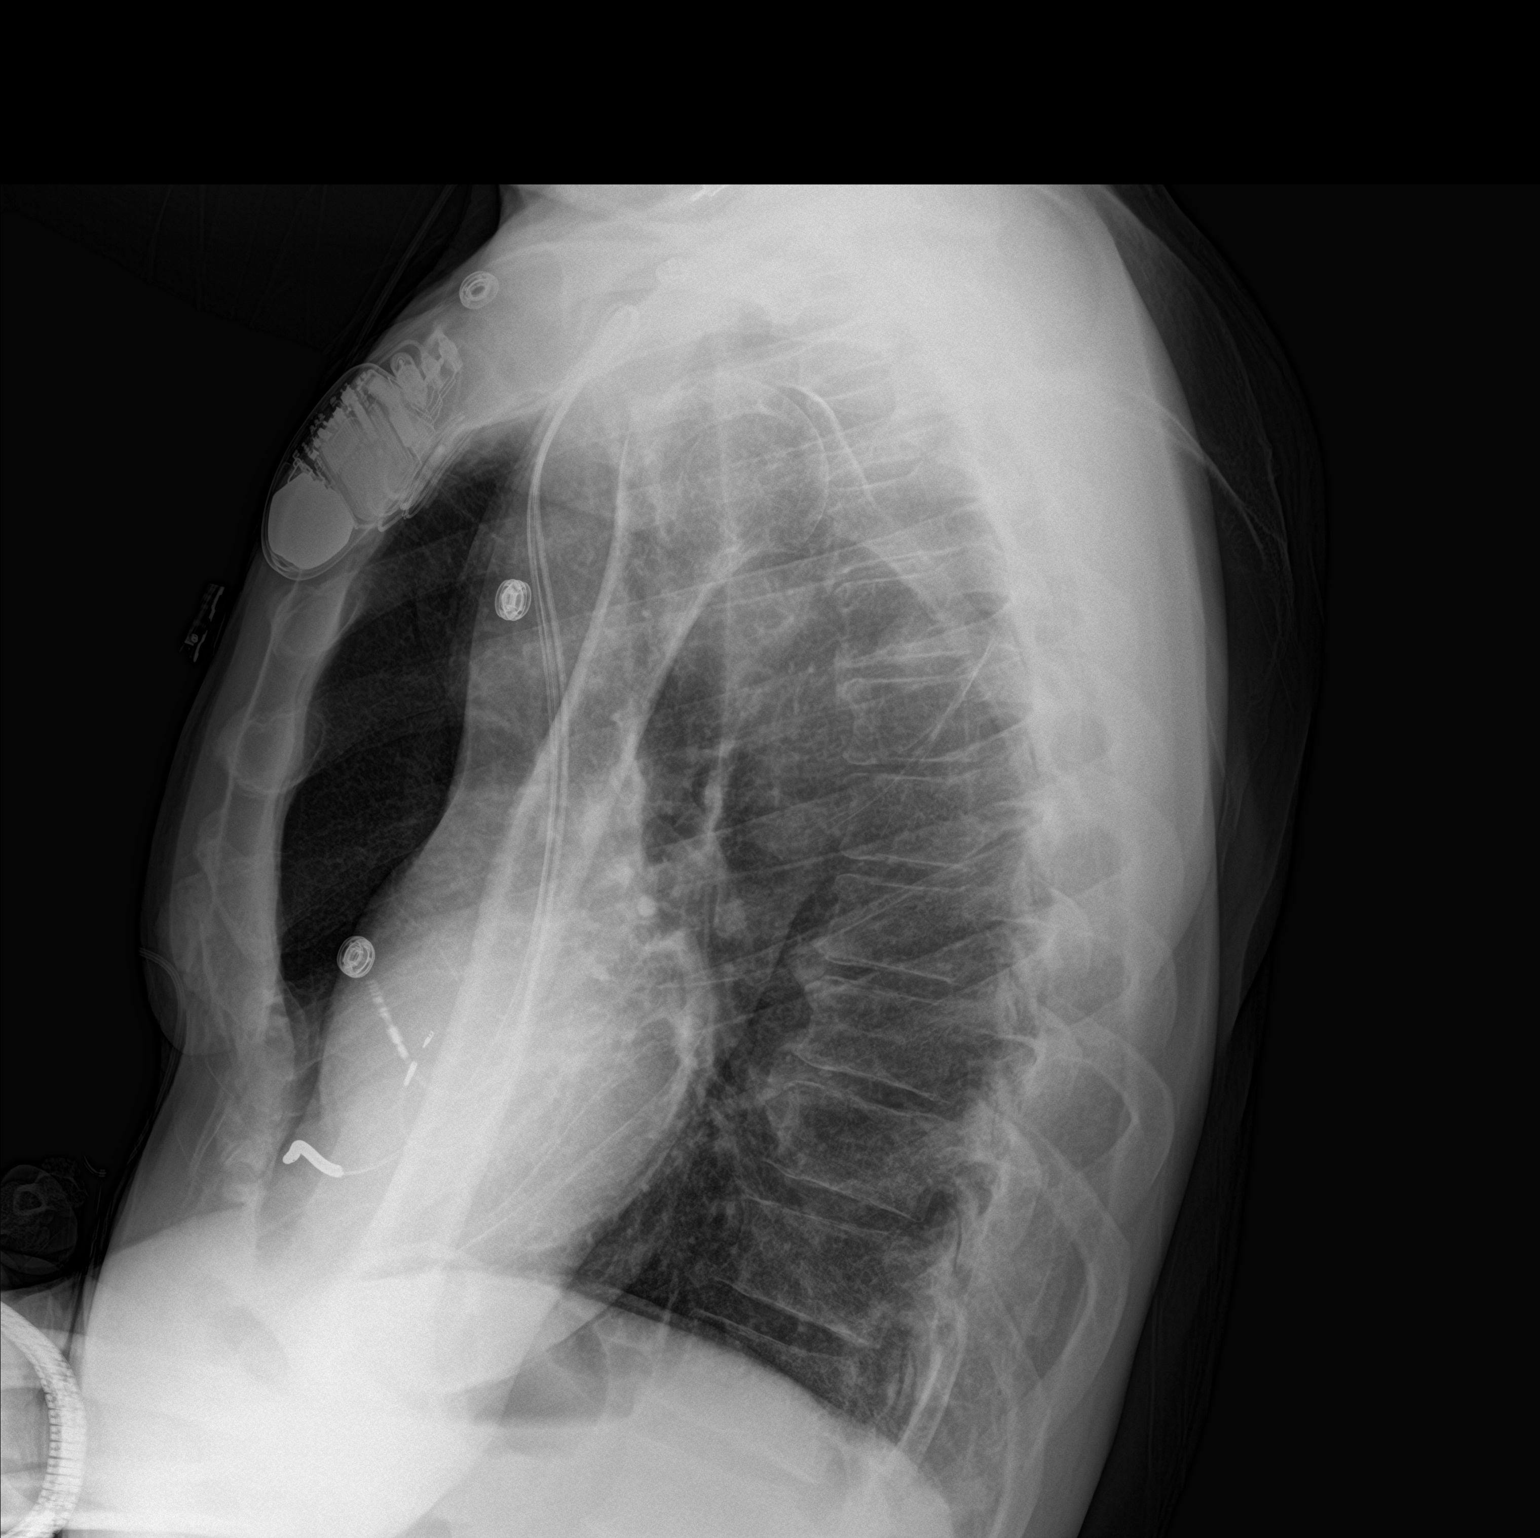

[2 of 2 positions shown; findings below may reference images not displayed]

FINDINGS: Defibrillator is now noted in satisfactory position. The lungs are
hyperinflated consistent with COPD. No sizable pneumothorax,
infiltrate or effusion is noted. Cardiac shadow is within normal
limits. No bony abnormality is seen.
IMPRESSION: No pneumothorax following defibrillator placement.

## 2018-11-09 MED ORDER — SACUBITRIL-VALSARTAN 24-26 MG PO TABS: 1.0000 | ORAL_TABLET | Freq: Two times a day (BID) | ORAL | 6 refills | Status: DC

## 2018-11-09 MED ORDER — CARVEDILOL 6.25 MG PO TABS: 6.2500 mg | ORAL_TABLET | Freq: Two times a day (BID) | ORAL | 6 refills | Status: DC

## 2018-11-09 MED ORDER — ASPIRIN 81 MG PO TBEC: 81.0000 mg | DELAYED_RELEASE_TABLET | Freq: Every day | ORAL | 6 refills | Status: AC

## 2018-11-09 MED ORDER — ATORVASTATIN CALCIUM 40 MG PO TABS: 40.0000 mg | ORAL_TABLET | Freq: Every day | ORAL | 6 refills | Status: DC

## 2018-11-09 MED ORDER — NITROGLYCERIN 0.4 MG SL SUBL: 0.4000 mg | SUBLINGUAL_TABLET | SUBLINGUAL | 6 refills | Status: DC | PRN

## 2018-11-09 MED ORDER — SPIRONOLACTONE 25 MG PO TABS: 12.5000 mg | ORAL_TABLET | Freq: Every day | ORAL | 6 refills | Status: DC

## 2018-11-09 NOTE — Care Management Note (Signed)
Case Management Note  Patient Details  Name: TIEGAN TERPSTRA MRN: [id number] Date of Birth: 24-Apr-1951  Subjective/Objective:  Implantation of MDT CRT-ICD 11/08/2018                  Action/Plan: Pt reports he was already on Entresto prior to admission and used 30 day free trial card. His copay for 90 day supply is $84. Pharmacy has medication on stock. He is connected to [facility] but has not been in two years. Explained he could utilize [place] pharmacy to get meds for free or at discounted rate. Pt has PCP but does not know name. He will call to arrange appt.     Expected Discharge Date:  11/09/18               Expected Discharge Plan:  Home/Self Care  In-House Referral:  NA  Discharge planning Services  CM Consult, Medication Assistance  Post Acute Care Choice:  NA Choice offered to:  NA  DME Arranged:  N/A DME Agency:  NA  HH Arranged:  NA HH Agency:  NA  Status of Service:  Completed, signed off  If discussed at [place] of Stay Meetings, dates discussed:    Additional Comments:  Erenest Rasher, RN 11/09/2018, 9:57 AM

## 2018-11-09 NOTE — Discharge Instructions (Signed)
° ° °  Supplemental Discharge Instructions for  Pacemaker/Defibrillator Patients  Activity No heavy lifting or vigorous activity with your left/right arm for 6 to 8 weeks.  Do not raise your left/right arm above your head for one week.  Gradually raise your affected arm as drawn below.             11/12/18                   11/13/18                    11/14/18                  11/15/18 __  NO DRIVING patient reports he does not drive.  WOUND CARE - Keep the wound area clean and dry.  Do not get this area wet, no showers until after your wound check visit - The tape/steri-strips on your wound will fall off; do not pull them off.  No bandage is needed on the site.  DO  NOT apply any creams, oils, or ointments to the wound area. - If you notice any drainage or discharge from the wound, any swelling or bruising at the site, or you develop a fever > 101? F after you are discharged home, call the office at once.  Special Instructions - You are still able to use cellular telephones; use the ear opposite the side where you have your pacemaker/defibrillator.  Avoid carrying your cellular phone near your device. - When traveling through airports, show security personnel your identification card to avoid being screened in the metal detectors.  Ask the security personnel to use the hand wand. - Avoid arc welding equipment, MRI testing (magnetic resonance imaging), TENS units (transcutaneous nerve stimulators).  Call the office for questions about other devices. - Avoid electrical appliances that are in poor condition or are not properly grounded. - Microwave ovens are safe to be near or to operate.  Additional information for defibrillator patients should your device go off: - If your device goes off ONCE and you feel fine afterward, notify the device clinic nurses. - If your device goes off ONCE and you do not feel well afterward, call 911. - If your device goes off TWICE, call 911. - If your device  goes off THREE times in one day, call 911.  DO NOT DRIVE YOURSELF OR A FAMILY MEMBER WITH A DEFIBRILLATOR TO THE HOSPITAL--CALL 911.

## 2018-11-09 NOTE — Progress Notes (Signed)
Patient discharged: Home with family  Via: Wheelchair   Discharge paperwork given: to patient and family  Reviewed with teach back  IV and telemetry disconnected  Belongings given to patient    

## 2018-11-11 MED FILL — Heparin Sod (Porcine)-NaCl IV Soln 1000 Unit/500ML-0.9%: INTRAVENOUS | Qty: 500 | Status: AC

## 2018-11-11 MED FILL — Lidocaine HCl Local Preservative Free (PF) Inj 1%: INTRAMUSCULAR | Qty: 30 | Status: AC

## 2018-11-23 ENCOUNTER — Ambulatory Visit (INDEPENDENT_AMBULATORY_CARE_PROVIDER_SITE_OTHER): Payer: Medicare Other | Admitting: *Deleted

## 2018-11-23 DIAGNOSIS — I447 Left bundle-branch block, unspecified: Secondary | ICD-10-CM | POA: Diagnosis not present

## 2018-11-23 DIAGNOSIS — I519 Heart disease, unspecified: Secondary | ICD-10-CM | POA: Diagnosis not present

## 2018-11-25 LAB — CUP PACEART INCLINIC DEVICE CHECK
Battery Voltage: 3 V
Brady Statistic AP VS Percent: 0.14 %
Brady Statistic AS VP Percent: 57.13 %
Brady Statistic AS VS Percent: 0.12 %
HIGH POWER IMPEDANCE MEASURED VALUE: 56 Ohm
Implantable Lead Implant Date: 20191126
Implantable Lead Implant Date: 20191126
Implantable Lead Implant Date: 20191126
Implantable Lead Location: 753859
Implantable Lead Location: 753860
Implantable Pulse Generator Implant Date: 20191126
Lead Channel Impedance Value: 266 Ohm
Lead Channel Impedance Value: 304 Ohm
Lead Channel Impedance Value: 361 Ohm
Lead Channel Impedance Value: 399 Ohm
Lead Channel Impedance Value: 437 Ohm
Lead Channel Pacing Threshold Amplitude: 0.5 V
Lead Channel Pacing Threshold Amplitude: 0.75 V
Lead Channel Pacing Threshold Pulse Width: 0.4 ms
Lead Channel Pacing Threshold Pulse Width: 0.4 ms
Lead Channel Pacing Threshold Pulse Width: 1 ms
Lead Channel Sensing Intrinsic Amplitude: 8.375 mV
Lead Channel Setting Pacing Amplitude: 3.5 V
Lead Channel Setting Pacing Amplitude: 3.5 V
Lead Channel Setting Pacing Pulse Width: 1 ms
Lead Channel Setting Sensing Sensitivity: 0.3 mV
MDC IDC LEAD LOCATION: 753860
MDC IDC MSMT BATTERY REMAINING LONGEVITY: 57 mo
MDC IDC MSMT LEADCHNL LV IMPEDANCE VALUE: 513 Ohm
MDC IDC MSMT LEADCHNL RA PACING THRESHOLD AMPLITUDE: 0.375 V
MDC IDC MSMT LEADCHNL RA SENSING INTR AMPL: 3.5 mV
MDC IDC MSMT LEADCHNL RA SENSING INTR AMPL: 3.625 mV
MDC IDC MSMT LEADCHNL RV SENSING INTR AMPL: 6.5 mV
MDC IDC SESS DTM: 20191211160737
MDC IDC SET LEADCHNL LV PACING AMPLITUDE: 3.5 V
MDC IDC SET LEADCHNL RV PACING PULSEWIDTH: 0.4 ms
MDC IDC STAT BRADY AP VP PERCENT: 42.62 %
MDC IDC STAT BRADY RA PERCENT PACED: 42.54 %
MDC IDC STAT BRADY RV PERCENT PACED: 99.29 %

## 2018-11-25 NOTE — Progress Notes (Signed)
Wound CRT-D device check in office.  Steri-strips removed. Wound without redness or edema. Incision edges approximated, wound well healed. Thresholds, sensing, and impedances consistent with implant measurements. Device programmed at 3.5V for extra safety margin until 3 month visit.  No mode switch episodes recorded. No ventricular arrhythmia episodes recorded. LV only (HIS) pacing appears non-selective until LOC at 0.50V@1 .96ms. Patient bi-ventricularly pacing 99% of the time. Device programmed with appropriate safety margins. Heart failure diagnostics reviewed and trends are stable for patient. Audible alerts demonstrated for patient. No changes made this session. Estimated longevity 4.7 years.  Patient educated about wound care, arm mobility, lifting restrictions, shock plan. ROV in 3 months with implanting physician.

## 2018-12-27 ENCOUNTER — Telehealth: Payer: Self-pay | Admitting: Cardiology

## 2018-12-27 NOTE — Telephone Encounter (Signed)
-----   Message from Patsey Berthold, NP sent at 12/27/2018  5:50 AM EST ----- Can ya'll call him too and ask him to stop sending manual transmissions every day?  Thank you!

## 2018-12-27 NOTE — Telephone Encounter (Signed)
Spoke w/ pt and I told him that he can stop sending manual transmissions every day. Pt verbalized understanding.

## 2018-12-29 ENCOUNTER — Other Ambulatory Visit: Payer: Self-pay

## 2018-12-29 ENCOUNTER — Ambulatory Visit (HOSPITAL_COMMUNITY): Payer: Medicare HMO | Attending: Cardiovascular Disease

## 2018-12-29 ENCOUNTER — Telehealth: Payer: Self-pay | Admitting: *Deleted

## 2018-12-29 DIAGNOSIS — I447 Left bundle-branch block, unspecified: Secondary | ICD-10-CM | POA: Diagnosis present

## 2018-12-29 DIAGNOSIS — I428 Other cardiomyopathies: Secondary | ICD-10-CM | POA: Diagnosis present

## 2018-12-29 DIAGNOSIS — I5041 Acute combined systolic (congestive) and diastolic (congestive) heart failure: Secondary | ICD-10-CM

## 2018-12-29 DIAGNOSIS — I5043 Acute on chronic combined systolic (congestive) and diastolic (congestive) heart failure: Secondary | ICD-10-CM

## 2018-12-29 MED ORDER — CARVEDILOL 6.25 MG PO TABS: 6.2500 mg | ORAL_TABLET | Freq: Two times a day (BID) | ORAL | 1 refills | Status: DC

## 2018-12-29 MED ORDER — SACUBITRIL-VALSARTAN 24-26 MG PO TABS: 1.0000 | ORAL_TABLET | Freq: Two times a day (BID) | ORAL | 2 refills | Status: DC

## 2018-12-29 NOTE — Telephone Encounter (Signed)
-----   Message from Dorothy Spark, MD sent at 12/29/2018  3:59 PM EST ----- LVEF remains severely decreased at 20-25% with elevated filling pressures. Please ask him if he feels better since he got his BiV ICD and if he has any signs of fluid overload. Thank you

## 2018-12-29 NOTE — Telephone Encounter (Signed)
Notes recorded by Nuala Alpha, LPN on 5/36/4680 at 3:21 PM EST Spoke with the pt and endorsed to him his echo results per Dr Meda Coffee.  Asked the pt how he is feeling and he said he is feeling much better since receiving his BiV ICD. Pt states he is able to walk up a hill now, his appetite is increasing, and he has no signs or symptoms of fluid retention, swelling in his lower extremities, or increased sob. He states he is weighing himself everyday as well.  Informed the pt that I will [place] this information along to Dr Meda Coffee, and if she makes any changes, we will notify him of that.  Pt verbalized understanding and agrees with this plan.  Pt is also requesting refills of his entresto and coreg.  Confirmed the pharmacy of choice with the pt.  Refills sent in for 90 day supply.

## 2019-01-24 ENCOUNTER — Encounter: Payer: Self-pay | Admitting: Cardiology

## 2019-01-24 ENCOUNTER — Ambulatory Visit (INDEPENDENT_AMBULATORY_CARE_PROVIDER_SITE_OTHER): Payer: Medicare HMO | Admitting: Cardiology

## 2019-01-24 VITALS — BP 120/60 | HR 63 | Ht 65.0 in | Wt 157.2 lb

## 2019-01-24 DIAGNOSIS — I5041 Acute combined systolic (congestive) and diastolic (congestive) heart failure: Secondary | ICD-10-CM | POA: Diagnosis not present

## 2019-01-24 DIAGNOSIS — Z95810 Presence of automatic (implantable) cardiac defibrillator: Secondary | ICD-10-CM

## 2019-01-24 DIAGNOSIS — I428 Other cardiomyopathies: Secondary | ICD-10-CM | POA: Diagnosis not present

## 2019-01-24 DIAGNOSIS — I10 Essential (primary) hypertension: Secondary | ICD-10-CM

## 2019-01-24 DIAGNOSIS — I447 Left bundle-branch block, unspecified: Secondary | ICD-10-CM

## 2019-01-24 DIAGNOSIS — I5043 Acute on chronic combined systolic (congestive) and diastolic (congestive) heart failure: Secondary | ICD-10-CM

## 2019-01-24 MED ORDER — SPIRONOLACTONE 25 MG PO TABS: 12.5000 mg | ORAL_TABLET | Freq: Every day | ORAL | 6 refills | Status: DC

## 2019-01-24 MED ORDER — ATORVASTATIN CALCIUM 40 MG PO TABS: 40.0000 mg | ORAL_TABLET | Freq: Every day | ORAL | 3 refills | Status: DC

## 2019-01-24 MED ORDER — CARVEDILOL 6.25 MG PO TABS: 6.2500 mg | ORAL_TABLET | Freq: Two times a day (BID) | ORAL | 3 refills | Status: DC

## 2019-01-24 NOTE — Progress Notes (Signed)
01/24/2019 Juan Norris   Mar 27, 1951  [id number]  Primary Physician Patient, No Pcp Per Primary Cardiologist: Dr. Meda Coffee   Reason for Visit/CC: 3 months f/u for NICM  HPI:  Juan Norris is a 68 y.o. male who is being seen today for management of nonischemic cardiomyopathy.  He has a history of tobacco abuse, hypertension and family history of coronary disease.  He presented to [facility] on March 29, 2018 with chest pain concerning for unstable angina.  He ruled in for non-STEMI with troponin peaking at 1.43.  He underwent cardiac catheterization on April 17 which showed mild nonobstructive CAD.  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  Medical therapy was recommended.  Echocardiogram showed LVEF 20 to 25% with diffuse hypokinesis along with grade 2 diastolic dysfunction. He was started on Entresto in September with no improvement in LVEF  He underwent placement of BiV ICD in November 2019.  Repeat echocardiogram in January 2020 showed ongoing severe LV dysfunction with LVEF 20 to 25%.  His EKG shows also wide QRS of 176 ms that is unchanged from prior to BiV ICD implantation.   Today he states that he has been doing great, he feels like he has more energy since pacemaker implantation, he is functional class IIa, he denies any lower extremity edema orthopnea proximal nocturnal dyspnea, no chest pain, he has not had any ICD firing.  He has been compliant with his medications.   Cardiac Studies  Cath 03/30/18:   Colon Flattery 2nd Mrg lesion is 40% stenosed.  Prox Cx to Mid Cx lesion is 20% stenosed.  Prox LAD lesion is 10% stenosed.  1. Mild non-obstructive CAD. His presentation could be due to coronary vasospasm.  2. Normal LV filling pressure.   Recommendations: Medical management of mild CAD. Consider addition of a long acting nitrate or calcium channel blocker for possible spasm. Echo is planned today to assess LV function.    Echo 03/30/18: Study Conclusions  -  Left ventricle: The cavity size was mildly dilated. Wall thickness was increased in a pattern of mild LVH. Systolic function was severely reduced. The estimated ejection fraction was in the range of 20% to 25%. Diffuse hypokinesis. Features are consistent with a pseudonormal left ventricular filling pattern, with concomitant abnormal relaxation and increased filling pressure (grade 2 diastolic dysfunction). - Aortic valve: Trileaflet; moderately thickened, moderately calcified leaflets. There was mild regurgitation. - Mitral valve: Mobility was restricted. The findings are consistent with mild stenosis. There was mild regurgitation. Mean gradient (D): 6 mm Hg. Valve area by pressure half-time: 2.29 cm^2. Valve area by continuity equation (using LVOT flow): 1.28 cm^2. - Tricuspid valve: There was mild regurgitation.   Current Meds  Medication Sig  . aspirin 81 MG EC tablet Take 1 tablet (81 mg total) by mouth daily.  Marland Kitchen atorvastatin (LIPITOR) 40 MG tablet Take 1 tablet (40 mg total) by mouth daily.  . carvedilol (COREG) 6.25 MG tablet Take 1 tablet (6.25 mg total) by mouth 2 (two) times daily with a meal.  . Multiple Vitamin (MULTIVITAMIN WITH MINERALS) TABS tablet Take 1 tablet by mouth daily.  . nitroGLYCERIN (NITROSTAT) 0.4 MG SL tablet Place 1 tablet (0.4 mg total) under the tongue every 5 (five) minutes x 3 doses as needed for chest pain.  . sacubitril-valsartan (ENTRESTO) 24-26 MG Take 1 tablet by mouth 2 (two) times daily.  Marland Kitchen spironolactone (ALDACTONE) 25 MG tablet Take 0.5 tablets (12.5 mg total) by mouth daily.  . [DISCONTINUED]  atorvastatin (LIPITOR) 40 MG tablet Take 1 tablet (40 mg total) by mouth daily.  . [DISCONTINUED] carvedilol (COREG) 6.25 MG tablet Take 1 tablet (6.25 mg total) by mouth 2 (two) times daily with a meal.  . [DISCONTINUED] spironolactone (ALDACTONE) 25 MG tablet Take 0.5 tablets (12.5 mg total) by mouth daily.   No Known  Allergies Past Medical History:  Diagnosis Date  . Acute renal insufficiency   . Anginal pain ([place])   . Chronic lower back pain    "due to 3 ruptured discs in my back" (11/08/2018)  . High cholesterol   . History of kidney stones 1982   "passed them"  . Hypertension   . Myocardial infarction ([place]) 03/2018  . Systolic heart failure ([place])   . Tobacco use    Family History  Problem Relation Age of Onset  . CAD Brother    Past Surgical History:  Procedure Laterality Date  . BIV ICD INSERTION CRT-D N/A 11/08/2018   Procedure: BIV ICD INSERTION CRT-D;  Surgeon: Thompson Grayer, MD;  Location: [place] [facility];  Service: Cardiovascular;  Laterality: N/A;  . CARDIAC CATHETERIZATION    . INGUINAL HERNIA REPAIR Bilateral   . KNEE SURGERY Left 1990s   "growth on the back of my knee removed"  . LEFT HEART CATH AND CORONARY ANGIOGRAPHY N/A 03/30/2018   Procedure: LEFT HEART CATH AND CORONARY ANGIOGRAPHY;  Surgeon: Burnell Blanks, MD;  Location: [place] [facility];  Service: Cardiovascular;  Laterality: N/A;   Social History   Socioeconomic History  . Marital status: Divorced    Spouse name: Not on file  . Number of children: Not on file  . Years of education: Not on file  . Highest education level: Not on file  Occupational History  . Not on file  Social Needs  . Financial resource strain: Not on file  . Food insecurity:    Worry: Not on file    Inability: Not on file  . Transportation needs:    Medical: Not on file    Non-medical: Not on file  Tobacco Use  . Smoking status: Current Some Day Smoker    Packs/day: 0.14    Years: 47.00    Pack years: 6.58  . Smokeless tobacco: Never Used  Substance and Sexual Activity  . Alcohol use: Not Currently    Comment: 11/08/2018 "stopped 09/23/1998"  . Drug use: Not Currently    Comment: 11/08/2018 "nothing in the 2000s"  . Sexual activity: Yes  Lifestyle  . Physical activity:    Days per week: Not on file    Minutes per  session: Not on file  . Stress: Not on file  Relationships  . Social connections:    Talks on phone: Not on file    Gets together: Not on file    Attends religious service: Not on file    Active member of club or organization: Not on file    Attends meetings of clubs or organizations: Not on file    Relationship status: Not on file  . Intimate partner violence:    Fear of current or ex partner: Not on file    Emotionally abused: Not on file    Physically abused: Not on file    Forced sexual activity: Not on file  Other Topics Concern  . Not on file  Social History Narrative  . Not on file     Review of Systems: General: negative for chills, fever, night sweats or weight changes.  Cardiovascular: negative for chest pain, dyspnea on exertion, edema, orthopnea, palpitations, paroxysmal nocturnal dyspnea or shortness of breath Dermatological: negative for rash Respiratory: negative for cough or wheezing Urologic: negative for hematuria Abdominal: negative for nausea, vomiting, diarrhea, bright red blood per rectum, melena, or hematemesis Neurologic: negative for visual changes, syncope, or dizziness All other systems reviewed and are otherwise negative except as noted above.   Physical Exam:  Blood pressure 120/60, pulse 63, height 5\' 5"  (1.651 m), weight 157 lb 3.2 oz (71.3 kg), SpO2 98 %.  General appearance: alert, cooperative and no distress Neck: no carotid bruit and no JVD Lungs: clear to auscultation bilaterally Heart: regular rate and rhythm, S1, S2 normal, no murmur, click, rub or gallop Extremities: extremities normal, atraumatic, no cyanosis or edema Pulses: 2+ and symmetric Skin: Skin color, texture, turgor normal. No rashes or lesions Neurologic: Grossly normal  EKG performed, showed SB, 1.AVB, NSIVCD, unchanged from prior  -- personally reviewed   Echo: 12/2018  Left ventricle: The cavity size was normal. There was mild   concentric hypertrophy. Systolic  function was severely reduced.   The estimated ejection fraction was in the range of 20% to 25%.   Diffuse hypokinesis. Doppler parameters are consistent with   abnormal left ventricular relaxation (grade 1 diastolic   dysfunction). Doppler parameters are consistent with high   ventricular filling pressure. - Ventricular septum: Septal motion showed abnormal function and   dyssynchrony. - Aortic valve: Transvalvular velocity was within the normal range.   There was no stenosis. There was mild to moderate regurgitation. - Mitral valve: Transvalvular velocity was within the normal range.   There was no evidence for stenosis. There was mild regurgitation.   Valve area by pressure half-time: 1.25 cm^2. - Left atrium: The atrium was moderately dilated. - Right ventricle: The cavity size was normal. Wall thickness was   normal. Systolic function was normal. - Right atrium: The atrium was severely dilated. - Atrial septum: No defect or patent foramen ovale was identified   by color flow Doppler. - Tricuspid valve: There was mild regurgitation. - Pulmonary arteries: Systolic pressure was within the normal   range. PA peak pressure: 25 mm Hg (S). - Global longitudinal stain -10.4% (abnormal).   ASSESSMENT AND PLAN:   1. NICM: normal cath in 03/2018, LVEF 20-25%, repeat echocardiogram showed LVEF of 20 to 25%, functional class IIa, improved from to be prior to BiV ICD implantation. Continue Entresto 26/24 mg, metoprolol 12.5 p.o. daily.    2. Chronic combined systolic and diastolic CHF - as above, the patient is euvolemic.  3. Hypertension - controlled  4.  Hyperlipidemia -continue atorvastatin 40 mg daily.  Follow-Up w/ Dr. Meda Coffee in 4 months.  Labs prior to that appointment.  Ena Dawley, MD [facility] 01/24/2019 8:39 AM

## 2019-01-24 NOTE — Patient Instructions (Signed)
Medication Instructions:   Your physician recommends that you continue on your current medications as directed. Please refer to the Current Medication list given to you today.  If you need a refill on your cardiac medications before your next appointment, please call your pharmacy.     Lab work:  ONE WEEK PRIOR TO YOUR 4 MONTH FOLLOW-UP APPOINTMENT WITH DR Meda Coffee --TO CHECK--CMET, CBC W DIFF, AND TSH  If you have labs (blood work) drawn today and your tests are completely normal, you will receive your results only by: Marland Kitchen MyChart Message (if you have MyChart) OR . A paper copy in the mail If you have any lab test that is abnormal or we need to change your treatment, we will call you to review the results.    Follow-Up:  PLEASE ADD THE PATIENT INTO DR NELSON'S SCHEDULE FOR 05/19/2019 AT HER 8:20 AM SLOT--PLEASE ARRANGE HIS LAB APPOINTMENT A WEEK PRIOR TO THIS OFFICE VISIT

## 2019-02-13 ENCOUNTER — Encounter: Payer: Self-pay | Admitting: Internal Medicine

## 2019-02-13 ENCOUNTER — Ambulatory Visit (INDEPENDENT_AMBULATORY_CARE_PROVIDER_SITE_OTHER): Payer: Medicare HMO | Admitting: Internal Medicine

## 2019-02-13 VITALS — BP 134/68 | HR 84 | Ht 65.0 in | Wt 159.2 lb

## 2019-02-13 DIAGNOSIS — I447 Left bundle-branch block, unspecified: Secondary | ICD-10-CM

## 2019-02-13 DIAGNOSIS — Z95810 Presence of automatic (implantable) cardiac defibrillator: Secondary | ICD-10-CM | POA: Diagnosis not present

## 2019-02-13 DIAGNOSIS — I428 Other cardiomyopathies: Secondary | ICD-10-CM | POA: Diagnosis not present

## 2019-02-13 NOTE — Patient Instructions (Addendum)
Medication Instructions:  Your physician recommends that you continue on your current medications as directed. Please refer to the Current Medication list given to you today.  Labwork: None ordered.  Testing/Procedures: None ordered.  Follow-Up: Your physician wants you to follow-up in: 6 months with Chanetta Marshall, NP.   You will receive a reminder letter in the mail two months in advance. If you don't receive a letter, please call our office to schedule the follow-up appointment.  Remote monitoring is used to monitor your ICD from home. This monitoring reduces the number of office visits required to check your device to one time per year. It allows [place] to keep an eye on the functioning of your device to ensure it is working properly. You are scheduled for a device check from home on 05/15/2019. You may send your transmission at any time that day. If you have a wireless device, the transmission will be sent automatically. After your physician reviews your transmission, you will receive a postcard with your next transmission date.  Any Other Special Instructions Will Be Listed Below (If Applicable).  You have been enrolled in the [facility] clinic with Sharman Cheek.  If you need a refill on your cardiac medications before your next appointment, please call your pharmacy.

## 2019-02-13 NOTE — Progress Notes (Signed)
PCP: Patient, No Pcp Per Primary Cardiologist: Dr Meda Coffee Primary EP: Dr Alveta Heimlich is a 68 y.o. male who presents today for routine electrophysiology followup.  Since his BiV ICD implant (with 3830 His lead in LV port), the patient reports doing very well.  Today, he denies symptoms of palpitations, chest pain, shortness of breath,  lower extremity edema, dizziness, presyncope, syncope, or ICD shocks.  The patient is otherwise without complaint today.   Past Medical History:  Diagnosis Date  . Acute renal insufficiency   . Anginal pain ([place])   . Chronic lower back pain    "due to 3 ruptured discs in my back" (11/08/2018)  . High cholesterol   . History of kidney stones 1982   "passed them"  . Hypertension   . Myocardial infarction ([place]) 03/2018  . Systolic heart failure ([place])   . Tobacco use    Past Surgical History:  Procedure Laterality Date  . BIV ICD INSERTION CRT-D N/A 11/08/2018    Medtronic biventricular ICD implantation by Dr Rayann Heman with His Bundle pacing and a MDT 9163 lead placed into the LV port.   Marland Kitchen CARDIAC CATHETERIZATION    . INGUINAL HERNIA REPAIR Bilateral   . KNEE SURGERY Left 1990s   "growth on the back of my knee removed"  . LEFT HEART CATH AND CORONARY ANGIOGRAPHY N/A 03/30/2018   Procedure: LEFT HEART CATH AND CORONARY ANGIOGRAPHY;  Surgeon: Burnell Blanks, MD;  Location: [facility] [facility];  Service: Cardiovascular;  Laterality: N/A;    ROS- all systems are reviewed and negative except as per HPI above  Current Outpatient Medications  Medication Sig Dispense Refill  . aspirin 81 MG EC tablet Take 1 tablet (81 mg total) by mouth daily. 30 tablet 6  . atorvastatin (LIPITOR) 40 MG tablet Take 1 tablet (40 mg total) by mouth daily. 90 tablet 3  . carvedilol (COREG) 6.25 MG tablet Take 1 tablet (6.25 mg total) by mouth 2 (two) times daily with a meal. 180 tablet 3  . Multiple Vitamin (MULTIVITAMIN WITH MINERALS) TABS tablet Take 1 tablet by  mouth daily.    . nitroGLYCERIN (NITROSTAT) 0.4 MG SL tablet Place 1 tablet (0.4 mg total) under the tongue every 5 (five) minutes x 3 doses as needed for chest pain. 30 tablet 6  . sacubitril-valsartan (ENTRESTO) 24-26 MG Take 1 tablet by mouth 2 (two) times daily. 180 tablet 2  . spironolactone (ALDACTONE) 25 MG tablet Take 0.5 tablets (12.5 mg total) by mouth daily. 30 tablet 6   No current facility-administered medications for this visit.     Physical Exam: Vitals:   02/13/19 1531  BP: 134/68  Pulse: 84  SpO2: 98%  Weight: 159 lb 3.2 oz (72.2 kg)  Height: 5\' 5"  (1.651 m)    GEN- The patient is well appearing, alert and oriented x 3 today.   Head- normocephalic, atraumatic Eyes-  Sclera clear, conjunctiva pink Ears- hearing intact Oropharynx- clear Lungs- Clear to ausculation bilaterally, normal work of breathing Chest- ICD pocket is well healed Heart- Regular rate and rhythm, no murmurs, rubs or gallops, PMI not laterally displaced GI- soft, NT, ND, + BS Extremities- no clubbing, cyanosis, or edema  ICD interrogation- reviewed in detail today,  See PACEART report  ekg tracing ordered today is personally reviewed and shows sinus with BiV pacing  Wt Readings from Last 3 Encounters:  02/13/19 159 lb 3.2 oz (72.2 kg)  01/24/19 157 lb 3.2 oz (71.3 kg)  11/09/18 147 lb 0.8 oz (66.7 kg)    Assessment and Plan:  1.  Chronic systolic dysfunction/ nonischemic CM/ LBBB euvolemic today Stable on an appropriate medical regimen Normal ICD function He has a His bundle lead in the LV port with septal capture.  Best QRS at implant was LV prior to RV by 10 msec.  He feels well.  I therefore did not make changes today See Claudia Desanctis Art report No changes today enroll in ICM device clinic Though clinically improved, EF remains low (20%). I am not convinced that CRT optimization would be beneficial given his LV anatomy and His bundle pacing.  2. HTN Stable No change required  today  [facility] Return to see EP NP in 6 month  Thompson Grayer MD, [facility] 02/13/2019 3:41 PM

## 2019-02-14 LAB — CUP PACEART INCLINIC DEVICE CHECK
Date Time Interrogation Session: 20200303075752
Implantable Lead Implant Date: 20191126
Implantable Lead Implant Date: 20191126
Implantable Lead Implant Date: 20191126
Implantable Lead Location: 753859
Implantable Lead Location: 753860
Implantable Lead Location: 753860
Implantable Lead Model: 3830
Implantable Pulse Generator Implant Date: 20191126

## 2019-03-03 ENCOUNTER — Telehealth: Payer: Self-pay

## 2019-03-03 NOTE — Telephone Encounter (Signed)
Referred to ICM clinic by Dr Rayann Heman.    Spoke with patient and agreeable to monthly follow up.  Advised monitor should be by bedside in order for it to automatically transmit a report during sleep hours of 12 midnight and 6 AM.  Patient confirmed monitor is at bedside. Advised will receive a call after the transmission is reviewed to provide results.  Explained a Remote Home Transmission will be seen as daytime appointment on office summary visit sheet but the time is not relevant since all transmissions are sent at night time so there is no obligation to stay by the monitor at that time appointed time during the day. Provided ICM number and explained should call if experiencing any fluid symptoms such as weight gain, shortness of breath or extremity/abdominal swelling. Weighs about twice a week.  Most current weight is 158 lbs.  1st remote transmission scheduled 03/27/2019.

## 2019-03-27 ENCOUNTER — Ambulatory Visit (INDEPENDENT_AMBULATORY_CARE_PROVIDER_SITE_OTHER): Payer: Medicare HMO

## 2019-03-27 ENCOUNTER — Other Ambulatory Visit: Payer: Self-pay

## 2019-03-27 DIAGNOSIS — I428 Other cardiomyopathies: Secondary | ICD-10-CM

## 2019-03-27 DIAGNOSIS — Z95810 Presence of automatic (implantable) cardiac defibrillator: Secondary | ICD-10-CM | POA: Diagnosis not present

## 2019-03-28 NOTE — Progress Notes (Signed)
EPIC Encounter for ICM Monitoring  Patient Name: ROBERTA KELLY is a 68 y.o. male Date: 03/28/2019 Primary Care Physican: Patient, No Pcp Per Primary Cardiologist: Meda Coffee Electrophysiologist: Allred Bi-V Pacing:   99.3%  03/28/2019 Weight: 161 lbs        1st ICM remote.  Heart Failure questions reviewed.  Pt asymptomatic.  Patient said he is having difficulty affording food and needs to try to get to a food pantry. He uses bus for transportation and leave with nephew and grandson that are 24 and 11 years old.  Advised I will check if there is any other assistance available for food.  He appreciates any help.    Report: Thoracic impedance normal.   Recommendations:  No changes.  Reinforced limiting salt intake to < 2000 mg daily and fluid intake to 64 oz daily.  Encouraged to call for fluid symptoms.  Follow-up plan: ICM clinic phone appointment on 05/01/2019.       Copy of ICM check sent to Dr. Rayann Heman.   3 month ICM trend: 03/27/2019    1 Year ICM trend:       Rosalene Billings, RN 03/28/2019 8:43 AM

## 2019-03-29 ENCOUNTER — Telehealth: Payer: Self-pay | Admitting: Licensed Clinical Social Worker

## 2019-03-29 NOTE — Progress Notes (Signed)
Patient left voice mail message saying he appreciated the food assistance offer but does have some one that is going to help him with food.    Message sent to Raquel Sarna, LSW regarding patient was interested in food assistance but the called back to say he has someone assisting him.

## 2019-03-29 NOTE — Telephone Encounter (Signed)
  CSW was referred to contact patient to inquire about food insecurity during this health crisis. Patient reported need and agreeable to weekly delivery from [employer]. Patient informed that delivery will be left on front door with no face to face contact with delivery person. Patient agreeable to plan and grateful for the assistance. Raquel Sarna, [place], [place]

## 2019-04-05 ENCOUNTER — Telehealth: Payer: Self-pay | Admitting: Licensed Clinical Social Worker

## 2019-04-05 NOTE — Telephone Encounter (Signed)
CSW contacted patient to follow up on weekly food delivery package. Patient informed of delivery time and no face to face contact during delivery. Patient verbalizes understanding and grateful for the assistance.  CSW continues to follow for supportive needs. Jackie Madden Piazza, LCSW, CCSW-MCS [phone]  

## 2019-04-12 ENCOUNTER — Telehealth: Payer: Self-pay | Admitting: Licensed Clinical Social Worker

## 2019-04-12 NOTE — Telephone Encounter (Signed)
CSW contacted patient to follow up on weekly food delivery package. Patient informed of delivery time and no face to face contact during delivery. Patient verbalizes understanding and grateful for the assistance.  CSW continues to follow for supportive needs. Jackie Ercel Normoyle, LCSW, CCSW-MCS [phone]  

## 2019-04-19 ENCOUNTER — Telehealth: Payer: Self-pay | Admitting: Licensed Clinical Social Worker

## 2019-04-19 NOTE — Telephone Encounter (Signed)
CSW contacted patient to follow up on weekly food delivery package. Patient informed of delivery time and no face to face contact during delivery. Message left as no answer.  CSW continues to follow for supportive needs. Jackie Cambreigh Dearing, LCSW, CCSW-MCS [phone] 

## 2019-04-26 ENCOUNTER — Telehealth: Payer: Self-pay | Admitting: Licensed Clinical Social Worker

## 2019-04-26 NOTE — Telephone Encounter (Signed)
CSW contacted patient to follow up on weekly food delivery package. Patient informed of delivery time and no face to face contact during delivery. Patient verbalizes understanding and grateful for the assistance.  CSW continues to follow for supportive needs. Jackie Shanita Kanan, LCSW, CCSW-MCS [phone]  

## 2019-05-01 ENCOUNTER — Other Ambulatory Visit: Payer: Self-pay

## 2019-05-01 ENCOUNTER — Ambulatory Visit (INDEPENDENT_AMBULATORY_CARE_PROVIDER_SITE_OTHER): Payer: Medicare HMO

## 2019-05-01 DIAGNOSIS — I5022 Chronic systolic (congestive) heart failure: Secondary | ICD-10-CM

## 2019-05-01 DIAGNOSIS — Z95810 Presence of automatic (implantable) cardiac defibrillator: Secondary | ICD-10-CM

## 2019-05-02 ENCOUNTER — Telehealth: Payer: Self-pay

## 2019-05-02 NOTE — Telephone Encounter (Signed)
Left message for patient to remind of missed remote transmission.  

## 2019-05-03 ENCOUNTER — Telehealth: Payer: Self-pay | Admitting: Licensed Clinical Social Worker

## 2019-05-03 NOTE — Telephone Encounter (Signed)
CSW called patient to schedule food delivery. Patient states he will not be home this March 04, 2023 as his sister passed away and will be at her funeral. Patient will be on hold this week and will reschedule for next week. Raquel Sarna, [place], [place]

## 2019-05-03 NOTE — Progress Notes (Signed)
EPIC Encounter for ICM Monitoring  Patient Name: Juan Norris is a 68 y.o. male Date: 05/03/2019 Primary Care Physican: Patient, No Pcp Per Primary Cardiologist: Meda Coffee Electrophysiologist: Allred Bi-V Pacing:   99.3%       03/28/2019 Weight: 161 lbs                                                            Heart Failure questions reviewed.  Pt asymptomatic.  Patient's sister passed away last week and he was eating foods that he typically would not eat.     Optivol Thoracic impedance normal.   Recommendations:  No changes.  Reinforced limiting salt intake to < 2000 mg daily and fluid intake to 64 oz daily.  Encouraged to call for fluid symptoms.  Follow-up plan: ICM clinic phone appointment on 06/05/2019.       Copy of ICM check sent to Dr. Rayann Heman  3 month ICM trend: 05/03/2019    1 Year ICM trend:       Rosalene Billings, RN 05/03/2019 5:19 PM

## 2019-05-10 ENCOUNTER — Telehealth: Payer: Self-pay | Admitting: Licensed Clinical Social Worker

## 2019-05-10 ENCOUNTER — Other Ambulatory Visit: Payer: Self-pay

## 2019-05-10 NOTE — Telephone Encounter (Signed)
CSW contacted patient to follow up on weekly food delivery package. Patient informed of no face to face contact during delivery. CSW discussed transition option for food delivery as the Covid 19 Food relief program will be ending on May 26, 2019. Patient verbalizes understanding and grateful for the assistance.  CSW continues to follow for supportive needs. Jackie Rayshaun Needle, LCSW, CCSW-MCS [phone] 

## 2019-05-11 ENCOUNTER — Telehealth: Payer: Self-pay | Admitting: *Deleted

## 2019-05-11 NOTE — Telephone Encounter (Signed)
-----   Message from Rivka Barbara sent at 05/11/2019  8:34 AM EDT ----- Good morning,  Patient has been rescheduled for 9/24.  ----- Message ----- From: Dorothy Spark, MD Sent: 05/09/2019  11:20 AM EDT To: Nuala Alpha, LPN, Juventino Slovak, CMA, #  August will be fine with me, thank you ----- Message ----- From: Juventino Slovak, CMA Sent: 05/09/2019  10:56 AM EDT To: Dorothy Spark, MD, Nuala Alpha, LPN, #  Spoke with patient to discuss virtual visit and he does not have a smartphone and prefers to reschedule his appointment until he can come in for a live visit. He is aware that the soonest that we are scheduling these appointment types is August and he is okay with that as he does not have any cardiac complaints at this time but will call the office should the need arise. I informed him that he would be contacted at a later date to have the appointment moved.

## 2019-05-15 ENCOUNTER — Ambulatory Visit (INDEPENDENT_AMBULATORY_CARE_PROVIDER_SITE_OTHER): Payer: Medicare HMO | Admitting: *Deleted

## 2019-05-15 DIAGNOSIS — I428 Other cardiomyopathies: Secondary | ICD-10-CM | POA: Diagnosis not present

## 2019-05-16 LAB — CUP PACEART REMOTE DEVICE CHECK
Battery Remaining Longevity: 78 mo
Battery Voltage: 2.99 V
Brady Statistic AP VP Percent: 35.23 %
Brady Statistic AP VS Percent: 0.04 %
Brady Statistic AS VP Percent: 64.44 %
Brady Statistic AS VS Percent: 0.29 %
Brady Statistic RA Percent Paced: 35.04 %
Brady Statistic RV Percent Paced: 99.25 %
Date Time Interrogation Session: 20200601062725
HighPow Impedance: 54 Ohm
Implantable Lead Implant Date: 20191126
Implantable Lead Implant Date: 20191126
Implantable Lead Implant Date: 20191126
Implantable Lead Location: 753859
Implantable Lead Location: 753860
Implantable Lead Location: 753860
Implantable Lead Model: 3830
Implantable Lead Model: 5076
Implantable Pulse Generator Implant Date: 20191126
Lead Channel Impedance Value: 228 Ohm
Lead Channel Impedance Value: 285 Ohm
Lead Channel Impedance Value: 285 Ohm
Lead Channel Impedance Value: 361 Ohm
Lead Channel Impedance Value: 437 Ohm
Lead Channel Impedance Value: 475 Ohm
Lead Channel Pacing Threshold Amplitude: 0.5 V
Lead Channel Pacing Threshold Amplitude: 0.625 V
Lead Channel Pacing Threshold Amplitude: 0.75 V
Lead Channel Pacing Threshold Pulse Width: 0.4 ms
Lead Channel Pacing Threshold Pulse Width: 0.4 ms
Lead Channel Pacing Threshold Pulse Width: 0.4 ms
Lead Channel Sensing Intrinsic Amplitude: 2.625 mV
Lead Channel Sensing Intrinsic Amplitude: 2.625 mV
Lead Channel Sensing Intrinsic Amplitude: 8.5 mV
Lead Channel Sensing Intrinsic Amplitude: 8.5 mV
Lead Channel Setting Pacing Amplitude: 1.5 V
Lead Channel Setting Pacing Amplitude: 2.5 V
Lead Channel Setting Pacing Amplitude: 2.5 V
Lead Channel Setting Pacing Pulse Width: 0.4 ms
Lead Channel Setting Pacing Pulse Width: 0.4 ms
Lead Channel Setting Sensing Sensitivity: 0.3 mV

## 2019-05-17 ENCOUNTER — Telehealth: Payer: Self-pay | Admitting: Licensed Clinical Social Worker

## 2019-05-17 NOTE — Telephone Encounter (Signed)
CSW contacted patient to follow up on weekly food delivery package. Patient informed of no face to face contact during delivery. CSW discussed transition option for food delivery as the Covid 19 Food relief program will be ending on May 26, 2019. Patient verbalizes understanding and grateful for the assistance.  CSW continues to follow for supportive needs. Jackie Ziana Heyliger, LCSW, CCSW-MCS [phone] 

## 2019-05-19 ENCOUNTER — Telehealth: Payer: Self-pay | Admitting: Cardiology

## 2019-05-23 ENCOUNTER — Encounter: Payer: Self-pay | Admitting: Cardiology

## 2019-05-23 ENCOUNTER — Telehealth: Payer: Self-pay | Admitting: Licensed Clinical Social Worker

## 2019-05-23 NOTE — Progress Notes (Signed)
Remote ICD transmission.   

## 2019-05-23 NOTE — Telephone Encounter (Signed)
CSW contacted patient to follow up on weekly food delivery package. Patient informed of no face to face contact during delivery. CSW discussed transition option for food delivery as the Covid 19 Food relief program will be ending on May 26, 2019. Patient verbalizes understanding and grateful for the assistance.  CSW continues to follow for supportive needs. Jackie Brysin Towery, LCSW, CCSW-MCS [phone] 

## 2019-06-13 ENCOUNTER — Telehealth: Payer: Self-pay | Admitting: Licensed Clinical Social Worker

## 2019-06-13 NOTE — Telephone Encounter (Signed)
CSW contacted patient to discuss and enroll in Moms meals. Patient agreeable to program and verbalizes understanding of application. CSW informed patient that application will be submitted and will receive delivery date from [employer].  Patient grateful for the opportunity with the CV [employer] program for heart healthy meals. Patient informed this program is temporary although hopeful to support patient through the summer. CSW will continue to monitor and be available as needed.  Raquel Sarna, [place], [place]

## 2019-06-14 ENCOUNTER — Telehealth: Payer: Self-pay | Admitting: Licensed Clinical Social Worker

## 2019-06-14 NOTE — Progress Notes (Signed)
No ICM remote transmission received for 06/05/2019 and next ICM transmission scheduled for 07/10/2019.

## 2019-06-14 NOTE — Telephone Encounter (Signed)
CSW returned call to patient and message left. Raquel Sarna, [place], [place]

## 2019-07-10 ENCOUNTER — Ambulatory Visit (INDEPENDENT_AMBULATORY_CARE_PROVIDER_SITE_OTHER): Payer: Medicare HMO

## 2019-07-10 DIAGNOSIS — Z95810 Presence of automatic (implantable) cardiac defibrillator: Secondary | ICD-10-CM

## 2019-07-10 DIAGNOSIS — I5022 Chronic systolic (congestive) heart failure: Secondary | ICD-10-CM | POA: Diagnosis not present

## 2019-07-11 ENCOUNTER — Telehealth: Payer: Self-pay | Admitting: Licensed Clinical Social Worker

## 2019-07-11 ENCOUNTER — Telehealth: Payer: Self-pay

## 2019-07-11 NOTE — Telephone Encounter (Signed)
Spoke with patient to remind of missed remote transmission 

## 2019-07-11 NOTE — Telephone Encounter (Signed)
CSW attempted to contact patient to follow up on potential need post [employer]oms Meals program. The last delivery will be this week. Message left for return call. Jackie Dillyn Joaquin, LCSW, CCSW-MCS [phone] 

## 2019-07-12 NOTE — Progress Notes (Signed)
EPIC Encounter for ICM Monitoring  Patient Name: Juan Norris is a 68 y.o. male Date: 07/12/2019 Primary Care Physican: Patient, No Pcp Per Primary Cardiologist:Nelson Electrophysiologist:Allred Bi-V Pacing:99.3% 07/12/2019 Weight: 162 lbs   Heart Failure questions reviewed. Pt asymptomatic.    Optivol Thoracic impedance normal.   Recommendations:No changes. Reinforced limiting salt intake to <2000 mg daily and fluid intake to 64 oz daily. Encouraged to call for fluid symptoms.  Follow-up plan: ICM clinic phone appointment on 08/15/2019.   Copy of ICM check sent to Dr.Allred  3 month ICM trend: 07/11/2019    1 Year ICM trend:       Rosalene Billings, RN 07/12/2019 9:35 AM

## 2019-08-14 ENCOUNTER — Ambulatory Visit (INDEPENDENT_AMBULATORY_CARE_PROVIDER_SITE_OTHER): Payer: Medicare HMO | Admitting: *Deleted

## 2019-08-14 DIAGNOSIS — I428 Other cardiomyopathies: Secondary | ICD-10-CM

## 2019-08-14 LAB — CUP PACEART REMOTE DEVICE CHECK
Battery Remaining Longevity: 77 mo
Battery Voltage: 2.98 V
Brady Statistic AP VP Percent: 38.46 %
Brady Statistic AP VS Percent: 0.02 %
Brady Statistic AS VP Percent: 61.42 %
Brady Statistic AS VS Percent: 0.1 %
Brady Statistic RA Percent Paced: 38.26 %
Brady Statistic RV Percent Paced: 99.41 %
Date Time Interrogation Session: 20200831052302
HighPow Impedance: 59 Ohm
Implantable Lead Implant Date: 20191126
Implantable Lead Implant Date: 20191126
Implantable Lead Implant Date: 20191126
Implantable Lead Location: 753859
Implantable Lead Location: 753860
Implantable Lead Location: 753860
Implantable Lead Model: 3830
Implantable Lead Model: 5076
Implantable Pulse Generator Implant Date: 20191126
Lead Channel Impedance Value: 266 Ohm
Lead Channel Impedance Value: 285 Ohm
Lead Channel Impedance Value: 304 Ohm
Lead Channel Impedance Value: 399 Ohm
Lead Channel Impedance Value: 475 Ohm
Lead Channel Impedance Value: 475 Ohm
Lead Channel Pacing Threshold Amplitude: 0.625 V
Lead Channel Pacing Threshold Amplitude: 0.625 V
Lead Channel Pacing Threshold Amplitude: 0.75 V
Lead Channel Pacing Threshold Pulse Width: 0.4 ms
Lead Channel Pacing Threshold Pulse Width: 0.4 ms
Lead Channel Pacing Threshold Pulse Width: 0.4 ms
Lead Channel Sensing Intrinsic Amplitude: 2.75 mV
Lead Channel Sensing Intrinsic Amplitude: 2.75 mV
Lead Channel Sensing Intrinsic Amplitude: 6.375 mV
Lead Channel Sensing Intrinsic Amplitude: 6.375 mV
Lead Channel Setting Pacing Amplitude: 1.5 V
Lead Channel Setting Pacing Amplitude: 2.5 V
Lead Channel Setting Pacing Amplitude: 2.5 V
Lead Channel Setting Pacing Pulse Width: 0.4 ms
Lead Channel Setting Pacing Pulse Width: 0.4 ms
Lead Channel Setting Sensing Sensitivity: 0.3 mV

## 2019-08-15 ENCOUNTER — Ambulatory Visit (INDEPENDENT_AMBULATORY_CARE_PROVIDER_SITE_OTHER): Payer: Medicare HMO

## 2019-08-15 DIAGNOSIS — I5022 Chronic systolic (congestive) heart failure: Secondary | ICD-10-CM

## 2019-08-15 DIAGNOSIS — Z95810 Presence of automatic (implantable) cardiac defibrillator: Secondary | ICD-10-CM

## 2019-08-18 NOTE — Progress Notes (Signed)
EPIC Encounter for ICM Monitoring  Patient Name: Juan Norris is a 68 y.o. male Date: 08/18/2019 Primary Care Physican: Patient, No Pcp Per Primary Cardiologist:Nelson Electrophysiologist:Allred Bi-V Pacing:99.3% 07/12/2019 Weight: 162 lbs   Heart Failure questions reviewed. Pt asymptomatic and says he is feeling good.   OptivolThoracic impedance normal.   Recommendations:No changes and encouraged to call if experiencing any fluid symptoms.  Follow-up plan: ICM clinic phone appointment on 10/09/2019.   91 day device clinic remote transmission due 11/13/2019.  Office appt 09/04/2019 with Dr. Meda Coffee.    Copy of ICM check sent to Dr. Rayann Heman.   3 month ICM trend: 08/14/2019    1 Year ICM trend:       Rosalene Billings, RN 08/18/2019 10:36 AM

## 2019-08-23 ENCOUNTER — Encounter: Payer: Self-pay | Admitting: Cardiology

## 2019-08-23 NOTE — Progress Notes (Signed)
Remote ICD transmission.   

## 2019-09-04 ENCOUNTER — Encounter: Payer: Self-pay | Admitting: Cardiology

## 2019-09-04 ENCOUNTER — Other Ambulatory Visit: Payer: Self-pay

## 2019-09-04 ENCOUNTER — Ambulatory Visit (INDEPENDENT_AMBULATORY_CARE_PROVIDER_SITE_OTHER): Payer: Medicare HMO | Admitting: Cardiology

## 2019-09-04 DIAGNOSIS — I5043 Acute on chronic combined systolic (congestive) and diastolic (congestive) heart failure: Secondary | ICD-10-CM | POA: Diagnosis not present

## 2019-09-04 DIAGNOSIS — I447 Left bundle-branch block, unspecified: Secondary | ICD-10-CM | POA: Diagnosis not present

## 2019-09-04 DIAGNOSIS — I5041 Acute combined systolic (congestive) and diastolic (congestive) heart failure: Secondary | ICD-10-CM

## 2019-09-04 DIAGNOSIS — I428 Other cardiomyopathies: Secondary | ICD-10-CM | POA: Diagnosis not present

## 2019-09-04 LAB — COMPREHENSIVE METABOLIC PANEL
ALT: 20 IU/L (ref 0–44)
AST: 22 IU/L (ref 0–40)
Albumin/Globulin Ratio: 1.4 (ref 1.2–2.2)
Albumin: 4.2 g/dL (ref 3.8–4.8)
Alkaline Phosphatase: 110 IU/L (ref 39–117)
BUN/Creatinine Ratio: 7 — ABNORMAL LOW (ref 10–24)
BUN: 7 mg/dL — ABNORMAL LOW (ref 8–27)
Bilirubin Total: 0.7 mg/dL (ref 0.0–1.2)
CO2: 22 mmol/L (ref 20–29)
Calcium: 9.5 mg/dL (ref 8.6–10.2)
Chloride: 104 mmol/L (ref 96–106)
Creatinine, Ser: 0.98 mg/dL (ref 0.76–1.27)
GFR calc Af Amer: 91 mL/min/{1.73_m2} (ref >59)
GFR calc non Af Amer: 79 mL/min/{1.73_m2} (ref >59)
Globulin, Total: 2.9 g/dL (ref 1.5–4.5)
Glucose: 95 mg/dL (ref 65–99)
Potassium: 4.3 mmol/L (ref 3.5–5.2)
Sodium: 139 mmol/L (ref 134–144)
Total Protein: 7.1 g/dL (ref 6.0–8.5)

## 2019-09-04 LAB — CBC WITH DIFFERENTIAL/PLATELET
Basophils Absolute: 0 10*3/uL (ref 0.0–0.2)
Basos: 0 %
EOS (ABSOLUTE): 0 10*3/uL (ref 0.0–0.4)
Eos: 0 %
Hematocrit: 46 % (ref 37.5–51.0)
Hemoglobin: 15.9 g/dL (ref 13.0–17.7)
Immature Grans (Abs): 0 10*3/uL (ref 0.0–0.1)
Immature Granulocytes: 0 %
Lymphocytes Absolute: 2 10*3/uL (ref 0.7–3.1)
Lymphs: 17 %
MCH: 30.2 pg (ref 26.6–33.0)
MCHC: 34.6 g/dL (ref 31.5–35.7)
MCV: 88 fL (ref 79–97)
Monocytes Absolute: 0.9 10*3/uL (ref 0.1–0.9)
Monocytes: 8 %
Neutrophils Absolute: 8.5 10*3/uL — ABNORMAL HIGH (ref 1.4–7.0)
Neutrophils: 75 %
Platelets: 230 10*3/uL (ref 150–450)
RBC: 5.26 x10E6/uL (ref 4.14–5.80)
RDW: 12.1 % (ref 11.6–15.4)
WBC: 11.5 10*3/uL — ABNORMAL HIGH (ref 3.4–10.8)

## 2019-09-04 LAB — LIPID PANEL
Chol/HDL Ratio: 2.7 ratio (ref 0.0–5.0)
Cholesterol, Total: 92 mg/dL — ABNORMAL LOW (ref 100–199)
HDL: 34 mg/dL — ABNORMAL LOW (ref >39)
LDL Chol Calc (NIH): 47 mg/dL (ref 0–99)
Triglycerides: 42 mg/dL (ref 0–149)
VLDL Cholesterol Cal: 11 mg/dL (ref 5–40)

## 2019-09-04 LAB — TSH: TSH: 1.71 u[IU]/mL (ref 0.450–4.500)

## 2019-09-04 MED ORDER — ENTRESTO 24-26 MG PO TABS: 1.0000 | ORAL_TABLET | Freq: Two times a day (BID) | ORAL | 2 refills | Status: DC

## 2019-09-04 MED ORDER — ATORVASTATIN CALCIUM 40 MG PO TABS: 40.0000 mg | ORAL_TABLET | Freq: Every day | ORAL | 3 refills | Status: DC

## 2019-09-04 MED ORDER — SPIRONOLACTONE 25 MG PO TABS: 12.5000 mg | ORAL_TABLET | Freq: Every day | ORAL | 6 refills | Status: DC

## 2019-09-04 MED ORDER — CARVEDILOL 6.25 MG PO TABS: 6.2500 mg | ORAL_TABLET | Freq: Two times a day (BID) | ORAL | 3 refills | Status: DC

## 2019-09-04 NOTE — Patient Instructions (Signed)
Medication Instructions:   Your physician recommends that you continue on your current medications as directed. Please refer to the Current Medication list given to you today.  If you need a refill on your cardiac medications before your next appointment, please call your pharmacy.    Lab work:  TODAY-CMET CBC W DIFF, TSH, AND LIPIDS  If you have labs (blood work) drawn today and your tests are completely normal, you will receive your results only by: Marland Kitchen MyChart Message (if you have MyChart) OR . A paper copy in the mail If you have any lab test that is abnormal or we need to change your treatment, we will call you to review the results.   Follow-Up: At [facility], you and your health needs are our priority.  As part of our continuing mission to provide you with exceptional heart care, we have created designated Provider Care Teams.  These Care Teams include your primary Cardiologist (physician) and Advanced Practice Providers (APPs -  Physician Assistants and Nurse Practitioners) who all work together to provide you with the care you need, when you need it. You will need a follow up appointment in 6 months.  Please call our office 2 months in advance to schedule this appointment.  You may see Ena Dawley, MD or one of the following Advanced Practice Providers on your designated Care Team:   [place], PA-C Melina Copa, PA-C . Ermalinda Barrios, PA-C

## 2019-09-04 NOTE — Progress Notes (Signed)
09/04/2019 Juan Norris   11/13/1951  [id number]  Primary Physician Patient, No Pcp Per Primary Cardiologist: Dr. Meda Coffee   Reason for Visit/CC: 6 months f/u for NICM  HPI:  Juan Norris is a 68 y.o. male who is being seen today for management of nonischemic cardiomyopathy.  He has a history of tobacco abuse, hypertension and family history of coronary disease.  He [facility] to [facility] on March 29, 2018 with chest pain concerning for unstable angina.  He ruled in for non-STEMI with troponin peaking at 1.43.  He underwent cardiac catheterization on April 17 which showed mild nonobstructive CAD.  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  Medical therapy was recommended.  Echocardiogram showed LVEF 20 to 25% with diffuse hypokinesis along with grade 2 diastolic dysfunction. He was started on Entresto in September with no improvement in LVEF  He underwent placement of BiV ICD in November 2019.  Repeat echocardiogram in January 2020 showed ongoing severe LV dysfunction with LVEF 20 to 25%.  His EKG shows also wide QRS of 176 ms that is unchanged from prior to BiV ICD implantation.   01/24/2019  - today he states that he has been doing great, he feels like he has more energy since pacemaker implantation, he is functional class IIa, he denies any lower extremity edema orthopnea proximal nocturnal dyspnea, no chest pain, he has not had any ICD firing.  He has been compliant with his medications.  09/04/2019 -this is 6 months follow-up, the patient is doing great, he continues to walk a little bit every day, gets tired after about 2 blocks.  Denies any orthopnea proximal nocturnal dyspnea or lower extremity edema.  He has no chest pain or shortness of breath.  He has no palpitation or ICD firing. Cardiac Studies  Cath 03/30/18:   Colon Flattery 2nd Mrg lesion is 40% stenosed.  Prox Cx to Mid Cx lesion is 20% stenosed.  Prox LAD lesion is 10% stenosed.  1. Mild non-obstructive CAD. His  presentation could be due to coronary vasospasm.  2. Normal LV filling pressure.   Recommendations: Medical management of mild CAD. Consider addition of a long acting nitrate or calcium channel blocker for possible spasm. Echo is planned today to assess LV function.    Echo 03/30/18: Study Conclusions  - Left ventricle: The cavity size was mildly dilated. Wall thickness was increased in a pattern of mild LVH. Systolic function was severely reduced. The estimated ejection fraction was in the range of 20% to 25%. Diffuse hypokinesis. Features are consistent with a pseudonormal left ventricular filling pattern, with concomitant abnormal relaxation and increased filling pressure (grade 2 diastolic dysfunction). - Aortic valve: Trileaflet; moderately thickened, moderately calcified leaflets. There was mild regurgitation. - Mitral valve: Mobility was restricted. The findings are consistent with mild stenosis. There was mild regurgitation. Mean gradient (D): 6 mm Hg. Valve area by pressure half-time: 2.29 cm^2. Valve area by continuity equation (using LVOT flow): 1.28 cm^2. - Tricuspid valve: There was mild regurgitation.   Current Meds  Medication Sig  . aspirin 81 MG EC tablet Take 1 tablet (81 mg total) by mouth daily.  Marland Kitchen atorvastatin (LIPITOR) 40 MG tablet Take 1 tablet (40 mg total) by mouth daily.  . carvedilol (COREG) 6.25 MG tablet Take 1 tablet (6.25 mg total) by mouth 2 (two) times daily with a meal.  . Multiple Vitamin (MULTIVITAMIN WITH MINERALS) TABS tablet Take 1 tablet by mouth daily.  . sacubitril-valsartan (ENTRESTO) 24-26 MG  Take 1 tablet by mouth 2 (two) times daily.  Marland Kitchen spironolactone (ALDACTONE) 25 MG tablet Take 0.5 tablets (12.5 mg total) by mouth daily.  . [DISCONTINUED] atorvastatin (LIPITOR) 40 MG tablet Take 1 tablet (40 mg total) by mouth daily.  . [DISCONTINUED] carvedilol (COREG) 6.25 MG tablet Take 1 tablet (6.25 mg total) by mouth  2 (two) times daily with a meal.  . [DISCONTINUED] sacubitril-valsartan (ENTRESTO) 24-26 MG Take 1 tablet by mouth 2 (two) times daily.  . [DISCONTINUED] spironolactone (ALDACTONE) 25 MG tablet Take 0.5 tablets (12.5 mg total) by mouth daily.   No Known Allergies Past Medical History:  Diagnosis Date  . Acute renal insufficiency   . Anginal pain ([place])   . Chronic lower back pain    "due to 3 ruptured discs in my back" (11/08/2018)  . High cholesterol   . History of kidney stones 1982   "passed them"  . Hypertension   . Myocardial infarction ([place]) 03/2018  . Systolic heart failure ([place])   . Tobacco use    Family History  Problem Relation Age of Onset  . CAD Brother    Past Surgical History:  Procedure Laterality Date  . BIV ICD INSERTION CRT-D N/A 11/08/2018    Medtronic biventricular ICD implantation by Dr Rayann Heman with His Bundle pacing and a MDT 9147 lead placed into the LV port.   Marland Kitchen CARDIAC CATHETERIZATION    . INGUINAL HERNIA REPAIR Bilateral   . KNEE SURGERY Left 1990s   "growth on the back of my knee removed"  . LEFT HEART CATH AND CORONARY ANGIOGRAPHY N/A 03/30/2018   Procedure: LEFT HEART CATH AND CORONARY ANGIOGRAPHY;  Surgeon: Burnell Blanks, MD;  Location: [facility];  Service: Cardiovascular;  Laterality: N/A;   Social History   Socioeconomic History  . Marital status: Divorced    Spouse name: Not on file  . Number of children: Not on file  . Years of education: Not on file  . Highest education level: Not on file  Occupational History  . Not on file  Social Needs  . Financial resource strain: Not on file  . Food insecurity    Worry: Not on file    Inability: Not on file  . Transportation needs    Medical: Not on file    Non-medical: Not on file  Tobacco Use  . Smoking status: Current Some Day Smoker    Packs/day: 0.14    Years: 47.00    Pack years: 6.58  . Smokeless tobacco: Never Used  Substance and Sexual Activity  . Alcohol use:  Not Currently    Comment: 11/08/2018 "stopped 09/23/1998"  . Drug use: Not Currently    Comment: 11/08/2018 "nothing in the 2000s"  . Sexual activity: Yes  Lifestyle  . Physical activity    Days per week: Not on file    Minutes per session: Not on file  . Stress: Not on file  Relationships  . Social Herbalist on phone: Not on file    Gets together: Not on file    Attends religious service: Not on file    Active member of club or organization: Not on file    Attends meetings of clubs or organizations: Not on file    Relationship status: Not on file  . Intimate partner violence    Fear of current or ex partner: Not on file    Emotionally abused: Not on file    Physically abused: Not on file  Forced sexual activity: Not on file  Other Topics Concern  . Not on file  Social History Narrative  . Not on file     Review of Systems: General: negative for chills, fever, night sweats or weight changes.  Cardiovascular: negative for chest pain, dyspnea on exertion, edema, orthopnea, palpitations, paroxysmal nocturnal dyspnea or shortness of breath Dermatological: negative for rash Respiratory: negative for cough or wheezing Urologic: negative for hematuria Abdominal: negative for nausea, vomiting, diarrhea, bright red blood per rectum, melena, or hematemesis Neurologic: negative for visual changes, syncope, or dizziness All other systems reviewed and are otherwise negative except as noted above.   Physical Exam:  Blood pressure 138/62, pulse 66, height 5\' 5"  (1.651 m), weight 166 lb 3.2 oz (75.4 kg), SpO2 98 %.  General appearance: alert, cooperative and no distress Neck: no carotid bruit and no JVD Lungs: clear to auscultation bilaterally Heart: regular rate and rhythm, S1, S2 normal, no murmur, click, rub or gallop Extremities: extremities normal, atraumatic, no cyanosis or edema Pulses: 2+ and symmetric Skin: Skin color, texture, turgor normal. No rashes or lesions  Neurologic: Grossly normal  EKG performed, showed SB, 1.AVB, NSIVCD, unchanged from prior  -- personally reviewed   Echo: 12/2018  Left ventricle: The cavity size was normal. There was mild   concentric hypertrophy. Systolic function was severely reduced.   The estimated ejection fraction was in the range of 20% to 25%.   Diffuse hypokinesis. Doppler parameters are consistent with   abnormal left ventricular relaxation (grade 1 diastolic   dysfunction). Doppler parameters are consistent with high   ventricular filling pressure. - Ventricular septum: Septal motion showed abnormal function and   dyssynchrony. - Aortic valve: Transvalvular velocity was within the normal range.   There was no stenosis. There was mild to moderate regurgitation. - Mitral valve: Transvalvular velocity was within the normal range.   There was no evidence for stenosis. There was mild regurgitation.   Valve area by pressure half-time: 1.25 cm^2. - Left atrium: The atrium was moderately dilated. - Right ventricle: The cavity size was normal. Wall thickness was   normal. Systolic function was normal. - Right atrium: The atrium was severely dilated. - Atrial septum: No defect or patent foramen ovale was identified   by color flow Doppler. - Tricuspid valve: There was mild regurgitation. - Pulmonary arteries: Systolic pressure was within the normal   range. PA peak pressure: 25 mm Hg (S). - Global longitudinal stain -10.4% (abnormal).   ASSESSMENT AND PLAN:   1. NICM: normal cath in 03/2018, LVEF 20-25%, repeat echocardiogram showed LVEF of 20 to 25%, functional class IIa, improved from to be prior to BiV ICD implantation. Continue Entresto 26/24 mg, metoprolol 12.5 p.o. daily.  No ICD firings, he is euvolemic.  2. Chronic combined systolic and diastolic CHF - as above, the patient is euvolemic.  3. Hypertension - controlled  4.  Hyperlipidemia -continue atorvastatin 40 mg daily.  Follow-Up w/ Dr. Meda Coffee  in 6 months.  Labs today  Ena Dawley, MD [facility] 09/04/2019 10:15 AM

## 2019-09-05 ENCOUNTER — Telehealth: Payer: Self-pay | Admitting: *Deleted

## 2019-09-05 NOTE — Telephone Encounter (Signed)
Pt has been notified of lab results by phone with verbal understanding. Pt states he only has today and tomorrow left for his Entresto. Pt states pharmacy told him they may not have his Rx in in time before he runs out. Pt asked should he just go ahead and take 1 tablet daily until his Rx comes in. I advised pt not recommended to take only 1 tablet daily and that he should takes his medications as prescribed. I advised pt to call the pharmacy and ask if they can give him enough Entresto to hold him over until the full Rx can be filled. Pt is agreeable and will call the pharmacy today when he gets home. Pt thanked me for the call and my help. Patient notified of result.  Please refer to phone note from today for complete details.   Julaine Hua, [facility] 09/05/2019 8:27 AM

## 2019-09-05 NOTE — Telephone Encounter (Signed)
-----   Message from Nuala Alpha, LPN sent at 5/39/6728  8:02 AM EDT -----  ----- Message ----- From: Dorothy Spark, MD Sent: 09/04/2019   7:23 PM EDT To: Nuala Alpha, LPN  All labs normal including lipids

## 2019-09-07 ENCOUNTER — Ambulatory Visit: Payer: BC Managed Care – PPO | Admitting: Cardiology

## 2019-10-04 ENCOUNTER — Telehealth: Payer: Self-pay

## 2019-10-09 ENCOUNTER — Ambulatory Visit (INDEPENDENT_AMBULATORY_CARE_PROVIDER_SITE_OTHER): Payer: Medicare HMO

## 2019-10-09 DIAGNOSIS — Z95810 Presence of automatic (implantable) cardiac defibrillator: Secondary | ICD-10-CM | POA: Diagnosis not present

## 2019-10-09 DIAGNOSIS — I5022 Chronic systolic (congestive) heart failure: Secondary | ICD-10-CM

## 2019-10-10 NOTE — Telephone Encounter (Signed)
error 

## 2019-10-11 NOTE — Progress Notes (Signed)
EPIC Encounter for ICM Monitoring  Patient Name: Juan Norris is a 68 y.o. male Date: 10/11/2019 Primary Care Physican: Patient, No Pcp Per Primary Cardiologist:Nelson Electrophysiologist:Allred Bi-V Pacing:99.3% 09/04/2019 Weight:166 lbs   Transmission reviewed.   OptivolThoracic impedance normal.   Recommendations: None  Follow-up plan: ICM clinic phone appointment on 11/14/2019.   91 day device clinic remote transmission 11/13/2019.     Copy of ICM check sent to Dr. Rayann Heman.   3 month ICM trend: 10/09/2019    1 Year ICM trend:       Rosalene Billings, RN 10/11/2019 12:30 PM

## 2019-11-13 ENCOUNTER — Ambulatory Visit (INDEPENDENT_AMBULATORY_CARE_PROVIDER_SITE_OTHER): Payer: Medicare HMO | Admitting: *Deleted

## 2019-11-13 DIAGNOSIS — I519 Heart disease, unspecified: Secondary | ICD-10-CM

## 2019-11-13 LAB — CUP PACEART REMOTE DEVICE CHECK
Battery Remaining Longevity: 73 mo
Battery Voltage: 2.98 V
Brady Statistic AP VP Percent: 20.97 %
Brady Statistic AP VS Percent: 0.02 %
Brady Statistic AS VP Percent: 78.87 %
Brady Statistic AS VS Percent: 0.14 %
Brady Statistic RA Percent Paced: 20.91 %
Brady Statistic RV Percent Paced: 99.51 %
Date Time Interrogation Session: 20201130002204
HighPow Impedance: 66 Ohm
Implantable Lead Implant Date: 20191126
Implantable Lead Implant Date: 20191126
Implantable Lead Implant Date: 20191126
Implantable Lead Location: 753859
Implantable Lead Location: 753860
Implantable Lead Location: 753860
Implantable Lead Model: 3830
Implantable Lead Model: 5076
Implantable Pulse Generator Implant Date: 20191126
Lead Channel Impedance Value: 266 Ohm
Lead Channel Impedance Value: 304 Ohm
Lead Channel Impedance Value: 304 Ohm
Lead Channel Impedance Value: 399 Ohm
Lead Channel Impedance Value: 475 Ohm
Lead Channel Impedance Value: 494 Ohm
Lead Channel Pacing Threshold Amplitude: 0.5 V
Lead Channel Pacing Threshold Amplitude: 0.625 V
Lead Channel Pacing Threshold Amplitude: 0.75 V
Lead Channel Pacing Threshold Pulse Width: 0.4 ms
Lead Channel Pacing Threshold Pulse Width: 0.4 ms
Lead Channel Pacing Threshold Pulse Width: 0.4 ms
Lead Channel Sensing Intrinsic Amplitude: 2.625 mV
Lead Channel Sensing Intrinsic Amplitude: 2.625 mV
Lead Channel Sensing Intrinsic Amplitude: 6.125 mV
Lead Channel Sensing Intrinsic Amplitude: 6.125 mV
Lead Channel Setting Pacing Amplitude: 1.5 V
Lead Channel Setting Pacing Amplitude: 2.5 V
Lead Channel Setting Pacing Amplitude: 2.5 V
Lead Channel Setting Pacing Pulse Width: 0.4 ms
Lead Channel Setting Pacing Pulse Width: 0.4 ms
Lead Channel Setting Sensing Sensitivity: 0.3 mV

## 2019-11-14 ENCOUNTER — Ambulatory Visit (INDEPENDENT_AMBULATORY_CARE_PROVIDER_SITE_OTHER): Payer: Medicare HMO

## 2019-11-14 DIAGNOSIS — I5022 Chronic systolic (congestive) heart failure: Secondary | ICD-10-CM | POA: Diagnosis not present

## 2019-11-14 DIAGNOSIS — Z95810 Presence of automatic (implantable) cardiac defibrillator: Secondary | ICD-10-CM

## 2019-11-15 ENCOUNTER — Encounter: Payer: Self-pay | Admitting: Cardiology

## 2019-11-17 ENCOUNTER — Telehealth: Payer: Self-pay

## 2019-11-17 NOTE — Progress Notes (Signed)
EPIC Encounter for ICM Monitoring  Patient Name: Juan Norris is a 68 y.o. male Date: 11/17/2019 Primary Care Physican: Patient, No Pcp Per Primary Cardiologist:Nelson Electrophysiologist:Allred Bi-V Pacing:99.5% 09/04/2019 Weight:166 lbs  Attempted call to patient and unable to reach.  Left detailed message per DPR regarding transmission. Transmission reviewed.   OptivolThoracic impedance normal.   Recommendations:   Left voice mail with ICM number and encouraged to call if experiencing any fluid symptoms.  Follow-up plan: ICM clinic phone appointment on 12/25/2019.   91 day device clinic remote transmission 3/1/20210.     Copy of ICM check sent to Dr. Rayann Heman.   3 month ICM trend: 11/13/2019    1 Year ICM trend:       Rosalene Billings, RN 11/17/2019 8:32 AM

## 2019-11-17 NOTE — Telephone Encounter (Signed)
Remote ICM transmission received.  Attempted call to patient regarding ICM remote transmission and left detailed message per DPR.  Advised to return call for any fluid symptoms or questions.  

## 2019-12-06 NOTE — Progress Notes (Signed)
ICD remote 

## 2019-12-25 ENCOUNTER — Ambulatory Visit (INDEPENDENT_AMBULATORY_CARE_PROVIDER_SITE_OTHER): Payer: Medicare HMO

## 2019-12-25 DIAGNOSIS — Z95810 Presence of automatic (implantable) cardiac defibrillator: Secondary | ICD-10-CM

## 2019-12-25 DIAGNOSIS — I5022 Chronic systolic (congestive) heart failure: Secondary | ICD-10-CM | POA: Diagnosis not present

## 2019-12-27 NOTE — Progress Notes (Signed)
EPIC Encounter for ICM Monitoring  Patient Name: Juan Norris is a 69 y.o. male Date: 12/27/2019 Primary Care Physican: Patient, No Pcp Per Primary Cardiologist:Nelson Electrophysiologist:Allred Bi-V Pacing:99% 09/04/2019 Weight:166lbs  Transmission reviewed.   OptivolThoracic impedance normal.  Recommendations:  None.  Follow-up plan: ICM clinic phone appointment on2/15/2021. 91 day device clinic remote transmission 3/1/20210.   Copy of ICM check sent to Dr.Allred.   3 month ICM trend: 12/25/2019    1 Year ICM trend:       Rosalene Billings, RN 12/27/2019 12:14 PM

## 2020-01-12 ENCOUNTER — Ambulatory Visit: Payer: Medicare HMO | Admitting: Cardiology

## 2020-01-29 ENCOUNTER — Ambulatory Visit (INDEPENDENT_AMBULATORY_CARE_PROVIDER_SITE_OTHER): Payer: Medicare HMO

## 2020-01-29 DIAGNOSIS — Z95810 Presence of automatic (implantable) cardiac defibrillator: Secondary | ICD-10-CM | POA: Diagnosis not present

## 2020-01-29 DIAGNOSIS — I5022 Chronic systolic (congestive) heart failure: Secondary | ICD-10-CM | POA: Diagnosis not present

## 2020-02-02 NOTE — Progress Notes (Signed)
EPIC Encounter for ICM Monitoring  Patient Name: Juan Norris is a 69 y.o. male Date: 02/02/2020 Primary Care Physican: Patient, No Pcp Per Primary Cardiologist:Nelson Electrophysiologist:Allred Bi-V Pacing:99.7% Last Weight:166lbs  Transmission reviewed.  OptivolThoracic impedance normal.  Recommendations:None.  Follow-up plan: ICM clinic phone appointment on3/22/2021. 91 day device clinic remote transmission3/1/20210.   Copy of ICM check sent to Dr.Allred.  3 month ICM trend: 01/29/2020    1 Year ICM trend:       Rosalene Billings, RN 02/02/2020 2:43 PM

## 2020-02-12 ENCOUNTER — Ambulatory Visit (INDEPENDENT_AMBULATORY_CARE_PROVIDER_SITE_OTHER): Payer: Medicare HMO | Admitting: *Deleted

## 2020-02-12 DIAGNOSIS — I519 Heart disease, unspecified: Secondary | ICD-10-CM | POA: Diagnosis not present

## 2020-02-12 LAB — CUP PACEART REMOTE DEVICE CHECK
Battery Remaining Longevity: 65 mo
Battery Voltage: 2.98 V
Brady Statistic AP VP Percent: 54.96 %
Brady Statistic AP VS Percent: 0.02 %
Brady Statistic AS VP Percent: 44.96 %
Brady Statistic AS VS Percent: 0.06 %
Brady Statistic RA Percent Paced: 54.71 %
Brady Statistic RV Percent Paced: 99.55 %
Date Time Interrogation Session: 20210301052603
HighPow Impedance: 59 Ohm
Implantable Lead Implant Date: 20191126
Implantable Lead Implant Date: 20191126
Implantable Lead Implant Date: 20191126
Implantable Lead Location: 753859
Implantable Lead Location: 753860
Implantable Lead Location: 753860
Implantable Lead Model: 3830
Implantable Lead Model: 5076
Implantable Pulse Generator Implant Date: 20191126
Lead Channel Impedance Value: 266 Ohm
Lead Channel Impedance Value: 285 Ohm
Lead Channel Impedance Value: 285 Ohm
Lead Channel Impedance Value: 304 Ohm
Lead Channel Impedance Value: 437 Ohm
Lead Channel Impedance Value: 494 Ohm
Lead Channel Pacing Threshold Amplitude: 0.625 V
Lead Channel Pacing Threshold Amplitude: 0.625 V
Lead Channel Pacing Threshold Amplitude: 0.75 V
Lead Channel Pacing Threshold Pulse Width: 0.4 ms
Lead Channel Pacing Threshold Pulse Width: 0.4 ms
Lead Channel Pacing Threshold Pulse Width: 0.4 ms
Lead Channel Sensing Intrinsic Amplitude: 10.625 mV
Lead Channel Sensing Intrinsic Amplitude: 10.625 mV
Lead Channel Sensing Intrinsic Amplitude: 2.75 mV
Lead Channel Sensing Intrinsic Amplitude: 2.75 mV
Lead Channel Setting Pacing Amplitude: 1.5 V
Lead Channel Setting Pacing Amplitude: 2.5 V
Lead Channel Setting Pacing Amplitude: 2.5 V
Lead Channel Setting Pacing Pulse Width: 0.4 ms
Lead Channel Setting Pacing Pulse Width: 0.4 ms
Lead Channel Setting Sensing Sensitivity: 0.3 mV

## 2020-02-13 NOTE — Progress Notes (Signed)
ICD Remote  

## 2020-02-20 ENCOUNTER — Telehealth: Payer: Self-pay

## 2020-02-20 NOTE — Telephone Encounter (Signed)
Returned patient call as requested by voice mail message.  He asked if he can take the COVID vaccine.  Advised from a cardiac standpoint, the cardiologists are recommending to take the vaccine if possible.  He has an appointment this week and wanted to make sure he could take it.  No further questions.

## 2020-03-04 ENCOUNTER — Ambulatory Visit (INDEPENDENT_AMBULATORY_CARE_PROVIDER_SITE_OTHER): Payer: Medicare HMO

## 2020-03-04 DIAGNOSIS — Z95810 Presence of automatic (implantable) cardiac defibrillator: Secondary | ICD-10-CM

## 2020-03-04 DIAGNOSIS — I5022 Chronic systolic (congestive) heart failure: Secondary | ICD-10-CM

## 2020-03-08 ENCOUNTER — Other Ambulatory Visit: Payer: Self-pay

## 2020-03-08 DIAGNOSIS — I428 Other cardiomyopathies: Secondary | ICD-10-CM

## 2020-03-08 DIAGNOSIS — I5041 Acute combined systolic (congestive) and diastolic (congestive) heart failure: Secondary | ICD-10-CM

## 2020-03-08 DIAGNOSIS — I447 Left bundle-branch block, unspecified: Secondary | ICD-10-CM

## 2020-03-08 DIAGNOSIS — I5043 Acute on chronic combined systolic (congestive) and diastolic (congestive) heart failure: Secondary | ICD-10-CM

## 2020-03-08 NOTE — Progress Notes (Signed)
EPIC Encounter for ICM Monitoring  Patient Name: Juan Norris is a 69 y.o. male Date: 03/08/2020 Primary Care Physican: Patient, No Pcp Per Primary Cardiologist:Nelson Electrophysiologist:Allred Bi-V Pacing:99.7% Last Weight:166lbs  Spoke with patient and reports feeling well at this time.  Denies fluid symptoms.  .  Patient requesting Spironolactone refill and phone note routed to refill department for assistance.  OptivolThoracic impedance normal.  Recommendations:No changes and encouraged to call if experiencing any fluid symptoms.  Follow-up plan: ICM clinic phone appointment on4/27/2021. 91 day device clinic remote transmission6/12/2019.   Copy of ICM check sent to Dr.Allred.  3 month ICM trend: 03/04/2020    1 Year ICM trend:       Rosalene Billings, RN 03/08/2020 8:09 AM

## 2020-03-08 NOTE — Telephone Encounter (Signed)
Received voice mail message from patient stating he only has 3 Spironolactone pills left and no refills.  He is asking for refills if the physician wants him to continue this medication.  [facility] [address] in [place].  Routed to refill department.

## 2020-03-11 MED ORDER — SPIRONOLACTONE 25 MG PO TABS: 12.5000 mg | ORAL_TABLET | Freq: Every day | ORAL | 5 refills | Status: DC

## 2020-04-09 ENCOUNTER — Ambulatory Visit (INDEPENDENT_AMBULATORY_CARE_PROVIDER_SITE_OTHER): Payer: Medicare HMO

## 2020-04-09 DIAGNOSIS — Z95810 Presence of automatic (implantable) cardiac defibrillator: Secondary | ICD-10-CM

## 2020-04-09 DIAGNOSIS — I5022 Chronic systolic (congestive) heart failure: Secondary | ICD-10-CM | POA: Diagnosis not present

## 2020-04-10 ENCOUNTER — Telehealth: Payer: Self-pay | Admitting: Cardiology

## 2020-04-10 ENCOUNTER — Telehealth: Payer: Self-pay

## 2020-04-10 NOTE — Telephone Encounter (Signed)
Left message for patient to remind of missed remote transmission.  

## 2020-04-10 NOTE — Telephone Encounter (Signed)
  1. Has your device fired? no  2. Is you device beeping? no  3. Are you experiencing draining or swelling at device site? no  4. Are you calling to see if we received your device transmission? yes  5. Have you passed out? No  Patient is having a hard time sending his transmission. Please call to assist     Please route to [place]

## 2020-04-10 NOTE — Telephone Encounter (Signed)
Transmission has been received from patients monitor and patient is aware

## 2020-04-12 NOTE — Progress Notes (Signed)
EPIC Encounter for ICM Monitoring  Patient Name: Juan Norris is a 69 y.o. male Date: 04/12/2020 Primary Care Physican: Patient, No Pcp Per Primary Cardiologist:Nelson Electrophysiologist:Allred Bi-V Pacing:98.2% 4/30 /2021 Weight:166lbs  Spoke with patient and reports feeling well at this time.  Denies fluid symptoms.   OptivolThoracic impedance normal.  Recommendations:No changes and encouraged to call if experiencing any fluid symptoms.  Follow-up plan: ICM clinic phone appointment on6/02/2020. 91 day device clinic remote transmission6/01/2020.   Copy of ICM check sent to Dr.Allred.  3 month ICM trend: 04/10/2020    1 Year ICM trend:       Rosalene Billings, RN 04/12/2020 1:38 PM

## 2020-05-15 ENCOUNTER — Ambulatory Visit (INDEPENDENT_AMBULATORY_CARE_PROVIDER_SITE_OTHER): Payer: Medicare HMO | Admitting: *Deleted

## 2020-05-15 DIAGNOSIS — I428 Other cardiomyopathies: Secondary | ICD-10-CM

## 2020-05-15 LAB — CUP PACEART REMOTE DEVICE CHECK
Battery Remaining Longevity: 60 mo
Battery Voltage: 2.97 V
Brady Statistic AP VP Percent: 32.59 %
Brady Statistic AP VS Percent: 0.01 %
Brady Statistic AS VP Percent: 67.34 %
Brady Statistic AS VS Percent: 0.06 %
Brady Statistic RA Percent Paced: 32.51 %
Brady Statistic RV Percent Paced: 99.66 %
Date Time Interrogation Session: 20210602012503
HighPow Impedance: 55 Ohm
Implantable Lead Implant Date: 20191126
Implantable Lead Implant Date: 20191126
Implantable Lead Implant Date: 20191126
Implantable Lead Location: 753859
Implantable Lead Location: 753860
Implantable Lead Location: 753860
Implantable Lead Model: 3830
Implantable Lead Model: 5076
Implantable Pulse Generator Implant Date: 20191126
Lead Channel Impedance Value: 266 Ohm
Lead Channel Impedance Value: 266 Ohm
Lead Channel Impedance Value: 304 Ohm
Lead Channel Impedance Value: 342 Ohm
Lead Channel Impedance Value: 437 Ohm
Lead Channel Impedance Value: 475 Ohm
Lead Channel Pacing Threshold Amplitude: 0.625 V
Lead Channel Pacing Threshold Amplitude: 0.625 V
Lead Channel Pacing Threshold Amplitude: 0.625 V
Lead Channel Pacing Threshold Pulse Width: 0.4 ms
Lead Channel Pacing Threshold Pulse Width: 0.4 ms
Lead Channel Pacing Threshold Pulse Width: 0.4 ms
Lead Channel Sensing Intrinsic Amplitude: 2.75 mV
Lead Channel Sensing Intrinsic Amplitude: 2.75 mV
Lead Channel Sensing Intrinsic Amplitude: 7.375 mV
Lead Channel Sensing Intrinsic Amplitude: 7.375 mV
Lead Channel Setting Pacing Amplitude: 1.5 V
Lead Channel Setting Pacing Amplitude: 2.5 V
Lead Channel Setting Pacing Amplitude: 2.5 V
Lead Channel Setting Pacing Pulse Width: 0.4 ms
Lead Channel Setting Pacing Pulse Width: 0.4 ms
Lead Channel Setting Sensing Sensitivity: 0.3 mV

## 2020-05-16 ENCOUNTER — Ambulatory Visit (INDEPENDENT_AMBULATORY_CARE_PROVIDER_SITE_OTHER): Payer: Medicare HMO

## 2020-05-16 DIAGNOSIS — Z95810 Presence of automatic (implantable) cardiac defibrillator: Secondary | ICD-10-CM | POA: Diagnosis not present

## 2020-05-16 DIAGNOSIS — I5022 Chronic systolic (congestive) heart failure: Secondary | ICD-10-CM

## 2020-05-21 ENCOUNTER — Other Ambulatory Visit: Payer: Self-pay | Admitting: Cardiology

## 2020-05-21 DIAGNOSIS — I428 Other cardiomyopathies: Secondary | ICD-10-CM

## 2020-05-21 DIAGNOSIS — I447 Left bundle-branch block, unspecified: Secondary | ICD-10-CM

## 2020-05-21 DIAGNOSIS — I5041 Acute combined systolic (congestive) and diastolic (congestive) heart failure: Secondary | ICD-10-CM

## 2020-05-21 DIAGNOSIS — I5043 Acute on chronic combined systolic (congestive) and diastolic (congestive) heart failure: Secondary | ICD-10-CM

## 2020-05-21 NOTE — Progress Notes (Signed)
Remote ICD transmission.   

## 2020-05-21 NOTE — Progress Notes (Signed)
EPIC Encounter for ICM Monitoring  Patient Name: Juan Norris is a 69 y.o. male Date: 05/21/2020 Primary Care Physican: Patient, No Pcp Per Primary Cardiologist:Nelson Electrophysiologist:Allred Bi-V Pacing:99.7% 05/21/2020 Weight:166lbs  Spoke with patient and reports feeling well at this time. Denies fluid symptoms.   OptivolThoracic impedance normal.  Recommendations:No changes and encouraged to call if experiencing any fluid symptoms.  Follow-up plan: ICM clinic phone appointment on7/11/2020. 91 day device clinic remote transmission8/30/2021.   Copy of ICM check sent to Dr.Allred.  3 month ICM trend: 05/15/2020    1 Year ICM trend:       Rosalene Billings, RN 05/21/2020 9:47 AM

## 2020-06-24 ENCOUNTER — Ambulatory Visit (INDEPENDENT_AMBULATORY_CARE_PROVIDER_SITE_OTHER): Payer: Medicare HMO

## 2020-06-24 DIAGNOSIS — I5022 Chronic systolic (congestive) heart failure: Secondary | ICD-10-CM

## 2020-06-24 DIAGNOSIS — Z95810 Presence of automatic (implantable) cardiac defibrillator: Secondary | ICD-10-CM | POA: Diagnosis not present

## 2020-06-26 NOTE — Progress Notes (Signed)
EPIC Encounter for ICM Monitoring  Patient Name: Juan Norris is a 69 y.o. male Date: 06/26/2020 Primary Care Physican: Patient, No Pcp Per Primary Cardiologist:Nelson Electrophysiologist:Allred Bi-V Pacing:99.3% 05/21/2020 Weight:166lbs  Transmission reviewed.  OptivolThoracic impedance normal.  Recommendations:No changes.  Follow-up plan: ICM clinic phone appointment on8/16/2021. 91 day device clinic remote transmission9/12/2019.   Copy of ICM check sent to Dr.Allred.  3 month ICM trend: 06/24/2020    1 Year ICM trend:       Rosalene Billings, RN 06/26/2020 4:57 PM

## 2020-07-29 ENCOUNTER — Ambulatory Visit (INDEPENDENT_AMBULATORY_CARE_PROVIDER_SITE_OTHER): Payer: Medicare HMO

## 2020-07-29 DIAGNOSIS — I5022 Chronic systolic (congestive) heart failure: Secondary | ICD-10-CM | POA: Diagnosis not present

## 2020-07-29 DIAGNOSIS — Z95810 Presence of automatic (implantable) cardiac defibrillator: Secondary | ICD-10-CM

## 2020-07-30 NOTE — Progress Notes (Signed)
EPIC Encounter for ICM Monitoring  Patient Name: Juan Norris is a 69 y.o. male Date: 07/30/2020 Primary Care Physican: Patient, No Pcp Per Primary Cardiologist:Nelson Electrophysiologist:Allred Bi-V Pacing:99.4% 07/30/2020 Weight:159lbs  Spoke with patient and reports feeling well at this time.  Denies fluid symptoms.  He does not routinely follow a low salt diet.   OptivolThoracic impedance suggesting possible fluid accumulation since approximately 06/27/2020.  Prescribed: Spironolactone 25 mg take 0.5 tablet (12.5 mg total) daily  Labs: 09/04/2019 Creatinine 0.98, BUN 7, Potassium 4.3, Sodium 139, GFR 79-91  Recommendations:Recommendation to limit salt intake to 2000 mg daily and fluid intake to 64 oz daily.  Encouraged to call if experiencing any fluid symptoms.   Follow-up plan: ICM clinic phone appointment on8/23/2021 to recheck fluid levels. 91 day device clinic remote transmission9/12/2019.   EP/Cardiology Office Visits:  Recall for 08/13/2019 with Chanetta Marshall, NP.    01/12/2020 OV with Dr Meda Coffee was canceled due to provider out of office but not rescheduled.  Copy of ICM check sent to Dr. Rayann Heman and Dr Meda Coffee for review and recommendations if needed.   3 month ICM trend: 07/29/2020    1 Year ICM trend:       Rosalene Billings, RN 07/30/2020 10:13 AM

## 2020-08-05 ENCOUNTER — Ambulatory Visit (INDEPENDENT_AMBULATORY_CARE_PROVIDER_SITE_OTHER): Payer: Medicare HMO

## 2020-08-05 DIAGNOSIS — Z95810 Presence of automatic (implantable) cardiac defibrillator: Secondary | ICD-10-CM

## 2020-08-05 DIAGNOSIS — I5022 Chronic systolic (congestive) heart failure: Secondary | ICD-10-CM

## 2020-08-07 ENCOUNTER — Telehealth: Payer: Self-pay

## 2020-08-07 NOTE — Telephone Encounter (Signed)
Remote ICM transmission received.  Attempted call to patient regarding ICM remote transmission and left detailed message per DPR.  Advised to return call for any fluid symptoms or questions. Next ICM remote transmission scheduled 09/02/2020.

## 2020-08-07 NOTE — Progress Notes (Signed)
EPIC Encounter for ICM Monitoring  Patient Name: Juan Norris is a 69 y.o. male Date: 08/07/2020 Primary Care Physican: Patient, No Pcp Per Primary Cardiologist:Nelson Electrophysiologist:Allred Bi-V Pacing:99.6% 07/30/2020 Weight:159lbs  Attempted call to patient and unable to reach.  Left detailed message per DPR regarding transmission. Transmission reviewed.   OptivolThoracic impedance returned to normal.  Prescribed: Spironolactone 25 mg take 0.5 tablet (12.5 mg total) daily  Labs: 09/04/2019 Creatinine 0.98, BUN 7, Potassium 4.3, Sodium 139, GFR 79-91  Recommendations: Left voice mail with ICM number and encouraged to call if experiencing any fluid symptoms.   Follow-up plan: ICM clinic phone appointment on 09/02/2020. 91 day device clinic remote transmission9/12/2019.   EP/Cardiology Office Visits:  Recall for 08/13/2019 with Chanetta Marshall, NP.    01/12/2020 OV with Dr Meda Coffee was canceled due to provider out of office but not rescheduled.  Copy of ICM check sent to Dr. Rayann Heman and Dr Meda Coffee as Juluis Rainier.  3 month ICM trend: 08/05/2020    1 Year ICM trend:       Rosalene Billings, RN 08/07/2020 2:19 PM

## 2020-08-14 ENCOUNTER — Ambulatory Visit (INDEPENDENT_AMBULATORY_CARE_PROVIDER_SITE_OTHER): Payer: Medicare HMO | Admitting: *Deleted

## 2020-08-14 DIAGNOSIS — I428 Other cardiomyopathies: Secondary | ICD-10-CM

## 2020-08-15 LAB — CUP PACEART REMOTE DEVICE CHECK
Battery Remaining Longevity: 54 mo
Battery Voltage: 2.97 V
Brady Statistic AP VP Percent: 29.69 %
Brady Statistic AP VS Percent: 0.02 %
Brady Statistic AS VP Percent: 70.24 %
Brady Statistic AS VS Percent: 0.05 %
Brady Statistic RA Percent Paced: 29.49 %
Brady Statistic RV Percent Paced: 99.31 %
Date Time Interrogation Session: 20210901022603
HighPow Impedance: 62 Ohm
Implantable Lead Implant Date: 20191126
Implantable Lead Implant Date: 20191126
Implantable Lead Implant Date: 20191126
Implantable Lead Location: 753859
Implantable Lead Location: 753860
Implantable Lead Location: 753860
Implantable Lead Model: 3830
Implantable Lead Model: 5076
Implantable Pulse Generator Implant Date: 20191126
Lead Channel Impedance Value: 228 Ohm
Lead Channel Impedance Value: 304 Ohm
Lead Channel Impedance Value: 304 Ohm
Lead Channel Impedance Value: 342 Ohm
Lead Channel Impedance Value: 437 Ohm
Lead Channel Impedance Value: 475 Ohm
Lead Channel Pacing Threshold Amplitude: 0.5 V
Lead Channel Pacing Threshold Amplitude: 0.625 V
Lead Channel Pacing Threshold Amplitude: 0.625 V
Lead Channel Pacing Threshold Pulse Width: 0.4 ms
Lead Channel Pacing Threshold Pulse Width: 0.4 ms
Lead Channel Pacing Threshold Pulse Width: 0.4 ms
Lead Channel Sensing Intrinsic Amplitude: 2 mV
Lead Channel Sensing Intrinsic Amplitude: 2 mV
Lead Channel Sensing Intrinsic Amplitude: 6.125 mV
Lead Channel Sensing Intrinsic Amplitude: 6.125 mV
Lead Channel Setting Pacing Amplitude: 1.5 V
Lead Channel Setting Pacing Amplitude: 2.5 V
Lead Channel Setting Pacing Amplitude: 2.5 V
Lead Channel Setting Pacing Pulse Width: 0.4 ms
Lead Channel Setting Pacing Pulse Width: 0.4 ms
Lead Channel Setting Sensing Sensitivity: 0.3 mV

## 2020-08-16 NOTE — Progress Notes (Signed)
Remote ICD transmission.   

## 2020-08-29 ENCOUNTER — Other Ambulatory Visit: Payer: Self-pay | Admitting: Cardiology

## 2020-08-29 DIAGNOSIS — I428 Other cardiomyopathies: Secondary | ICD-10-CM

## 2020-08-29 DIAGNOSIS — I447 Left bundle-branch block, unspecified: Secondary | ICD-10-CM

## 2020-08-29 DIAGNOSIS — I5041 Acute combined systolic (congestive) and diastolic (congestive) heart failure: Secondary | ICD-10-CM

## 2020-08-29 DIAGNOSIS — I5043 Acute on chronic combined systolic (congestive) and diastolic (congestive) heart failure: Secondary | ICD-10-CM

## 2020-08-30 ENCOUNTER — Telehealth: Payer: Self-pay

## 2020-08-30 NOTE — Telephone Encounter (Signed)
Returned patient call as requested by voice mail message regarding Entresto refill.  Advised prescription was sent by Dr Francesca Oman office on 08/29/2020 for a 30 day supply.  Advised he must schedule an office visit appt with Dr Meda Coffee for further refills.  He said he will call this morning.

## 2020-09-09 ENCOUNTER — Telehealth: Payer: Self-pay

## 2020-09-09 NOTE — Telephone Encounter (Signed)
Received voice mail message from patient stating he needs his medication list faxed to the [place] at [phone].  Call routed to medical records for follow on request.

## 2020-09-09 NOTE — Progress Notes (Signed)
No ICM remote transmission received for 09/02/2020 and next ICM transmission scheduled for 10/07/2020.

## 2020-09-16 NOTE — Telephone Encounter (Signed)
Patient's medication list faxed to [facility] 09/16/20

## 2020-09-20 ENCOUNTER — Other Ambulatory Visit: Payer: Self-pay | Admitting: Cardiology

## 2020-09-20 DIAGNOSIS — I5041 Acute combined systolic (congestive) and diastolic (congestive) heart failure: Secondary | ICD-10-CM

## 2020-09-20 DIAGNOSIS — I447 Left bundle-branch block, unspecified: Secondary | ICD-10-CM

## 2020-09-20 DIAGNOSIS — I428 Other cardiomyopathies: Secondary | ICD-10-CM

## 2020-09-20 DIAGNOSIS — I5043 Acute on chronic combined systolic (congestive) and diastolic (congestive) heart failure: Secondary | ICD-10-CM

## 2020-09-26 ENCOUNTER — Encounter: Payer: Medicare HMO | Admitting: Nurse Practitioner

## 2020-10-10 ENCOUNTER — Telehealth: Payer: Self-pay

## 2020-10-10 NOTE — Telephone Encounter (Signed)
LMOVM for pt to send missed transmission.

## 2020-10-10 NOTE — Telephone Encounter (Signed)
I tried to help send a manual transmission but it was unsuccessful. I called tech support for the pt and they are sending him a new handheld. He should receive it in 3 business days.

## 2020-10-14 ENCOUNTER — Ambulatory Visit (INDEPENDENT_AMBULATORY_CARE_PROVIDER_SITE_OTHER): Payer: Medicare HMO

## 2020-10-14 DIAGNOSIS — I5022 Chronic systolic (congestive) heart failure: Secondary | ICD-10-CM

## 2020-10-14 DIAGNOSIS — Z95810 Presence of automatic (implantable) cardiac defibrillator: Secondary | ICD-10-CM | POA: Diagnosis not present

## 2020-10-16 NOTE — Progress Notes (Signed)
EPIC Encounter for ICM Monitoring  Patient Name: Juan Norris is a 69 y.o. male Date: 10/16/2020 Primary Care Physican: Patient, No Pcp Per Primary Cardiologist:Nelson Electrophysiologist:Allred Bi-V Pacing:98.6% 07/30/2020 Weight:159lbs  Transmission reviewed.   OptivolThoracic impedancenormal.  Prescribed: Spironolactone 25 mg take 0.5 tablet (12.5 mg total) daily  Labs: 09/04/2019 Creatinine 0.98, BUN 7, Potassium 4.3, Sodium 139, GFR 79-91  Recommendations: No changes  Follow-up plan: ICM clinic phone appointment on 11/25/2020. 91 day device clinic remote transmission12/12/2019.   EP/Cardiology Office Visits:10/31/2020 with Chanetta Marshall, NP. 01/12/2020 OV with Dr Meda Coffee was canceled due to provider out of office but not rescheduled.  Copy of ICM check sent to Dr.Allred.   3 month ICM trend: 10/14/2020    1 Year ICM trend:       Rosalene Billings, RN 10/16/2020 5:05 PM

## 2020-10-31 ENCOUNTER — Other Ambulatory Visit: Payer: Self-pay

## 2020-10-31 ENCOUNTER — Telehealth: Payer: Self-pay

## 2020-10-31 ENCOUNTER — Encounter: Payer: Self-pay | Admitting: Nurse Practitioner

## 2020-10-31 ENCOUNTER — Ambulatory Visit (INDEPENDENT_AMBULATORY_CARE_PROVIDER_SITE_OTHER): Payer: Medicare HMO | Admitting: Nurse Practitioner

## 2020-10-31 VITALS — BP 130/60 | HR 60 | Ht 67.0 in | Wt 153.6 lb

## 2020-10-31 DIAGNOSIS — I5022 Chronic systolic (congestive) heart failure: Secondary | ICD-10-CM

## 2020-10-31 DIAGNOSIS — I10 Essential (primary) hypertension: Secondary | ICD-10-CM | POA: Diagnosis not present

## 2020-10-31 DIAGNOSIS — I447 Left bundle-branch block, unspecified: Secondary | ICD-10-CM

## 2020-10-31 DIAGNOSIS — I5041 Acute combined systolic (congestive) and diastolic (congestive) heart failure: Secondary | ICD-10-CM | POA: Diagnosis not present

## 2020-10-31 DIAGNOSIS — I428 Other cardiomyopathies: Secondary | ICD-10-CM

## 2020-10-31 DIAGNOSIS — I5043 Acute on chronic combined systolic (congestive) and diastolic (congestive) heart failure: Secondary | ICD-10-CM

## 2020-10-31 LAB — CUP PACEART INCLINIC DEVICE CHECK
Date Time Interrogation Session: 20211118082929
Implantable Lead Implant Date: 20191126
Implantable Lead Implant Date: 20191126
Implantable Lead Implant Date: 20191126
Implantable Lead Location: 753859
Implantable Lead Location: 753860
Implantable Lead Location: 753860
Implantable Lead Model: 3830
Implantable Lead Model: 5076
Implantable Pulse Generator Implant Date: 20191126

## 2020-10-31 MED ORDER — CARVEDILOL 6.25 MG PO TABS: 6.2500 mg | ORAL_TABLET | Freq: Two times a day (BID) | ORAL | 3 refills | Status: DC

## 2020-10-31 NOTE — Progress Notes (Signed)
Electrophysiology Office Note Date: 10/31/2020  ID:  Juan Norris, DOB Mar 31, 1951, MRN [id number]  PCP: Patient, No Pcp Per Primary Cardiologist: Meda Coffee Electrophysiologist: Allred  CC: Routine ICD follow-up  Juan Norris is a 69 y.o. male seen today for Dr Rayann Heman.  He presents today for routine electrophysiology followup.  Since last being seen in our clinic, the patient reports doing very well. He denies chest pain, palpitations, dyspnea, PND, orthopnea, nausea, vomiting, dizziness, syncope, edema, weight gain, or early satiety.  He has not had ICD shocks.   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 Acute renal insufficiency   . Anginal pain ([place])   . Chronic lower back pain    "due to 3 ruptured discs in my back" (11/08/2018)  . High cholesterol   . History of kidney stones 1982   "passed them"  . Hypertension   . Myocardial infarction ([place]) 03/2018  . Systolic heart failure ([place])   . Tobacco use    Past Surgical History:  Procedure Laterality Date  . BIV ICD INSERTION CRT-D N/A 11/08/2018    Medtronic biventricular ICD implantation by Dr Rayann Heman with His Bundle pacing and a MDT 1027 lead placed into the LV port.   Marland Kitchen CARDIAC CATHETERIZATION    . INGUINAL HERNIA REPAIR Bilateral   . KNEE SURGERY Left 1990s   "growth on the back of my knee removed"  . LEFT HEART CATH AND CORONARY ANGIOGRAPHY N/A 03/30/2018   Procedure: LEFT HEART CATH AND CORONARY ANGIOGRAPHY;  Surgeon: Burnell Blanks, MD;  Location: [facility];  Service: Cardiovascular;  Laterality: N/A;    Current Outpatient Medications  Medication Sig Dispense Refill  . aspirin 81 MG EC tablet Take 1 tablet (81 mg total) by mouth daily. 30 tablet 6  . atorvastatin (LIPITOR) 40 MG tablet Take 1 tablet by mouth once daily 90 tablet 0  . carvedilol (COREG) 6.25 MG  tablet Take 1 tablet (6.25 mg total) by mouth 2 (two) times daily with a meal. 180 tablet 3  . Multiple Vitamin (MULTIVITAMIN WITH MINERALS) TABS tablet Take 1 tablet by mouth daily.    . sacubitril-valsartan (ENTRESTO) 24-26 MG Take 1 tablet by mouth 2 (two) times daily. Please make overdue appt with Dr. Meda Coffee before anymore refills. 1st attempt 60 tablet 0  . spironolactone (ALDACTONE) 25 MG tablet Take 0.5 tablets (12.5 mg total) by mouth daily. 15 tablet 5   No current facility-administered medications for this visit.    Allergies:   Patient has no known allergies.   Social History: Social History   Socioeconomic History  . Marital status: Divorced    Spouse name: Not on file  . Number of children: Not on file  . Years of education: Not on file  . Highest education level: Not on file  Occupational History  . Not on file  Tobacco Use  . Smoking status: Current Some Day Smoker    Packs/day: 0.14    Years: 47.00    Pack years: 6.58  . Smokeless tobacco: Never Used  Vaping Use  . Vaping Use: Never used  Substance and Sexual Activity  . Alcohol use: Not Currently    Comment: 11/08/2018 "stopped 09/23/1998"  . Drug use: Not Currently    Comment: 11/08/2018 "nothing in the 2000s"  . Sexual activity: Yes  Other Topics  Concern  . Not on file  Social History Narrative  . Not on file   Social Determinants of Health   Financial Resource Strain:   . Difficulty of Paying Living Expenses: Not on file  Food Insecurity:   . Worried About Charity fundraiser in the Last Year: Not on file  . Ran Out of Food in the Last Year: Not on file  Transportation Needs:   . Lack of Transportation (Medical): Not on file  . Lack of Transportation (Non-Medical): Not on file  Physical Activity:   . Days of Exercise per Week: Not on file  . Minutes of Exercise per Session: Not on file  Stress:   . Feeling of Stress : Not on file  Social Connections:   . Frequency of Communication with  Friends and Family: Not on file  . Frequency of Social Gatherings with Friends and Family: Not on file  . Attends Religious Services: Not on file  . Active Member of Clubs or Organizations: Not on file  . Attends Archivist Meetings: Not on file  . Marital Status: Not on file  Intimate Partner Violence:   . Fear of Current or Ex-Partner: Not on file  . Emotionally Abused: Not on file  . Physically Abused: Not on file  . Sexually Abused: Not on file    Family History: Family History  Problem Relation Age of Onset  . CAD Brother     Review of Systems: All other systems reviewed and are otherwise negative except as noted above.   Physical Exam: VS:  BP 130/60   Pulse 60   Ht 5\' 7"  (1.702 m)   Wt 153 lb 9.6 oz (69.7 kg)   SpO2 95%   BMI 24.06 kg/m  , BMI Body mass index is 24.06 kg/m.  GEN- The patient is well appearing, alert and oriented x 3 today.   HEENT: normocephalic, atraumatic; sclera clear, conjunctiva pink; hearing intact; oropharynx clear; neck supple  Lungs-  normal work of breathing, scattered rhonchi  Heart- Regular rate and rhythm  GI- soft, non-tender, non-distended, bowel sounds present  Extremities- no clubbing, cyanosis, or edema; DP/PT/radial pulses 2+ bilaterally MS- no significant deformity or atrophy Skin- warm and dry, no rash or lesion; ICD pocket well healed Psych- euthymic mood, full affect Neuro- strength and sensation are intact  ICD interrogation- reviewed in detail today,  See PACEART report  EKG:  EKG is ordered today. The ekg ordered today shows sinus rhythm with V pacing   Recent Labs: No results found for requested labs within last 8760 hours.   Wt Readings from Last 3 Encounters:  10/31/20 153 lb 9.6 oz (69.7 kg)  09/04/19 166 lb 3.2 oz (75.4 kg)  02/13/19 159 lb 3.2 oz (72.2 kg)     Other studies Reviewed: Additional studies/ records that were reviewed today include: Dr Rayann Heman and Dr Francesca Oman note   Assessment and  Plan:  1.  Chronic systolic dysfunction euvolemic today Stable on an appropriate medical regimen Normal ICD function See Pace Art report No changes today EF remains depressed s/p CRT.  His Bundle lead with septal capture and no targets for LV lead placement He is clinically stable BMET done recently by PCP per patient.  I have asked him to get them to send [place] a copy  2.  HTN Stable No change required today    Current medicines are reviewed at length with the patient today.   The patient does not have concerns  regarding his medicines.  The following changes were made today:  none  Labs/ tests ordered today include: none Orders Placed This Encounter  Procedures  . CUP PACEART INCLINIC DEVICE CHECK     Disposition:   Follow up with [facility], Dr Rayann Heman 1 year    Signed, Chanetta Marshall, NP 10/31/2020 8:30 AM  [facility] [place] [address] [phone] (office) [phone] (fax)

## 2020-10-31 NOTE — Patient Instructions (Signed)
Medication Instructions:  *If you need a refill on your cardiac medications before your next appointment, please call your pharmacy*  Follow-Up: At [facility], you and your health needs are our priority.  As part of our continuing mission to provide you with exceptional heart care, we have created designated Provider Care Teams.  These Care Teams include your primary Cardiologist (physician) and Advanced Practice Providers (APPs -  Physician Assistants and Nurse Practitioners) who all work together to provide you with the care you need, when you need it.  We recommend signing up for the patient portal called "MyChart".  Sign up information is provided on this After Visit Summary.  MyChart is used to connect with patients for Virtual Visits (Telemedicine).  Patients are able to view lab/test results, encounter notes, upcoming appointments, etc.  Non-urgent messages can be sent to your provider as well.   To learn more about what you can do with MyChart, go to [email or web address].    Your next appointment:   Your physician wants you to follow-up in: 1 YEAR with Dr. Rayann Heman.  You will receive a reminder letter in the mail two months in advance. If you don't receive a letter, please call our office to schedule the follow-up appointment.  Remote monitoring is used to monitor your ICD from home. This monitoring reduces the number of office visits required to check your device to one time per year. It allows [place] to keep an eye on the functioning of your device to ensure it is working properly. You are scheduled for a device check from home on 11/13/20. You may send your transmission at any time that day. If you have a wireless device, the transmission will be sent automatically. After your physician reviews your transmission, you will receive a postcard with your next transmission date/  The format for your next appointment:   In Person with Thompson Grayer, MD

## 2020-10-31 NOTE — Telephone Encounter (Signed)
Pt seen in office today with Chanetta Marshall and stated that he had labs recently at PCP Pt unaware of contact information for PCP while in the office but stated he has it written down at home Instructed pt to call the office when he get home with PCP contact information so that I can call and have labs faxed ofver for Chanetta Marshall, NP to review

## 2020-11-05 ENCOUNTER — Telehealth: Payer: Self-pay | Admitting: Cardiology

## 2020-11-05 NOTE — Telephone Encounter (Signed)
Patient states we requested he call us with the name of his doctor where he got his lab work done so we could request results. The doctors name is Dr. Rosalin Hawking. Patient does not need a call back.

## 2020-11-13 ENCOUNTER — Ambulatory Visit (INDEPENDENT_AMBULATORY_CARE_PROVIDER_SITE_OTHER): Payer: Medicare HMO

## 2020-11-13 DIAGNOSIS — I428 Other cardiomyopathies: Secondary | ICD-10-CM

## 2020-11-13 LAB — CUP PACEART REMOTE DEVICE CHECK
Battery Remaining Longevity: 49 mo
Battery Voltage: 2.96 V
Brady Statistic AP VP Percent: 38.83 %
Brady Statistic AP VS Percent: 0.01 %
Brady Statistic AS VP Percent: 61.1 %
Brady Statistic AS VS Percent: 0.05 %
Brady Statistic RA Percent Paced: 38.65 %
Brady Statistic RV Percent Paced: 99.5 %
Date Time Interrogation Session: 20211201022824
HighPow Impedance: 52 Ohm
Implantable Lead Implant Date: 20191126
Implantable Lead Implant Date: 20191126
Implantable Lead Implant Date: 20191126
Implantable Lead Location: 753859
Implantable Lead Location: 753860
Implantable Lead Location: 753860
Implantable Lead Model: 3830
Implantable Lead Model: 5076
Implantable Pulse Generator Implant Date: 20191126
Lead Channel Impedance Value: 228 Ohm
Lead Channel Impedance Value: 285 Ohm
Lead Channel Impedance Value: 285 Ohm
Lead Channel Impedance Value: 342 Ohm
Lead Channel Impedance Value: 399 Ohm
Lead Channel Impedance Value: 437 Ohm
Lead Channel Pacing Threshold Amplitude: 0.375 V
Lead Channel Pacing Threshold Amplitude: 0.625 V
Lead Channel Pacing Threshold Amplitude: 0.625 V
Lead Channel Pacing Threshold Pulse Width: 0.4 ms
Lead Channel Pacing Threshold Pulse Width: 0.4 ms
Lead Channel Pacing Threshold Pulse Width: 0.4 ms
Lead Channel Sensing Intrinsic Amplitude: 2 mV
Lead Channel Sensing Intrinsic Amplitude: 2 mV
Lead Channel Sensing Intrinsic Amplitude: 5.25 mV
Lead Channel Sensing Intrinsic Amplitude: 5.25 mV
Lead Channel Setting Pacing Amplitude: 1.5 V
Lead Channel Setting Pacing Amplitude: 2.5 V
Lead Channel Setting Pacing Amplitude: 2.5 V
Lead Channel Setting Pacing Pulse Width: 0.4 ms
Lead Channel Setting Pacing Pulse Width: 0.4 ms
Lead Channel Setting Sensing Sensitivity: 0.3 mV

## 2020-11-21 NOTE — Progress Notes (Signed)
Remote ICD transmission.   

## 2020-11-25 ENCOUNTER — Ambulatory Visit (INDEPENDENT_AMBULATORY_CARE_PROVIDER_SITE_OTHER): Payer: Medicare HMO

## 2020-11-25 DIAGNOSIS — Z95810 Presence of automatic (implantable) cardiac defibrillator: Secondary | ICD-10-CM | POA: Diagnosis not present

## 2020-11-25 DIAGNOSIS — I5022 Chronic systolic (congestive) heart failure: Secondary | ICD-10-CM

## 2020-11-27 NOTE — Progress Notes (Signed)
EPIC Encounter for ICM Monitoring  Patient Name: Juan Norris is a 69 y.o. male Date: 11/27/2020 Primary Care Physican: Patient, No Pcp Per Primary Cardiologist:Nelson Electrophysiologist:Allred Bi-V Pacing:99.3% 11/27/2020 Weight:162lbs  Spoke with patient and reports feeling well at this time.  Denies fluid symptoms.    OptivolThoracic impedancenormal.  Prescribed: Spironolactone 25 mg take 0.5 tablet (12.5 mg total) daily  Labs: 09/04/2019 Creatinine 0.98, BUN 7, Potassium 4.3, Sodium 139, GFR 79-91  Recommendations:No changes and encouraged to call if experiencing any fluid symptoms.  Follow-up plan: ICM clinic phone appointment on 01/14/2021. 91 day device clinic remote transmission3/01/2021.   EP/Cardiology Office Visits:01/12/2020 OV with Dr Meda Coffee was canceled due to provider out of office but not rescheduled.  Copy of ICM check sent to Dr.Allred.   3 month ICM trend: 11/25/2020    1 Year ICM trend:       Rosalene Billings, RN 11/27/2020 8:26 AM

## 2020-12-17 ENCOUNTER — Other Ambulatory Visit: Payer: Self-pay | Admitting: Cardiology

## 2020-12-17 DIAGNOSIS — I5043 Acute on chronic combined systolic (congestive) and diastolic (congestive) heart failure: Secondary | ICD-10-CM

## 2020-12-17 DIAGNOSIS — I428 Other cardiomyopathies: Secondary | ICD-10-CM

## 2020-12-17 DIAGNOSIS — I5041 Acute combined systolic (congestive) and diastolic (congestive) heart failure: Secondary | ICD-10-CM

## 2020-12-17 DIAGNOSIS — I447 Left bundle-branch block, unspecified: Secondary | ICD-10-CM

## 2020-12-20 ENCOUNTER — Other Ambulatory Visit: Payer: Self-pay | Admitting: Cardiology

## 2020-12-20 DIAGNOSIS — I5043 Acute on chronic combined systolic (congestive) and diastolic (congestive) heart failure: Secondary | ICD-10-CM

## 2020-12-20 DIAGNOSIS — I428 Other cardiomyopathies: Secondary | ICD-10-CM

## 2020-12-20 DIAGNOSIS — I5041 Acute combined systolic (congestive) and diastolic (congestive) heart failure: Secondary | ICD-10-CM

## 2020-12-20 DIAGNOSIS — I447 Left bundle-branch block, unspecified: Secondary | ICD-10-CM

## 2021-01-03 ENCOUNTER — Telehealth: Payer: Self-pay | Admitting: Cardiology

## 2021-01-03 NOTE — Telephone Encounter (Signed)
Returned call to Pt.  Per Pt he has 9 teeth left that are very loose.  One has an abscess that drained last night.  He would like an antibiotic.  Advised Pt that office was not able to order antibiotic for abscessed tooth.  Encouraged Pt to call PCP.  Per Pt his PCP is with the [place].  He will call them to request antibiotic.

## 2021-01-03 NOTE — Telephone Encounter (Signed)
° ° °  Pt c/o medication issue:  1. Name of Medication: antibiotics   2. How are you currently taking this medication (dosage and times per day)?   3. Are you having a reaction (difficulty breathing--STAT)?   4. What is your medication issue? Pt said he had an abscess in his tooth but when he woke up this morning it cleared up, but he would like to ask if Dr. Meda Coffee can prescribe him a prescription for antibiotics to help it cleared/cleaned out. He said he doesn't want to call the dentist because they charge him a fee for a question.

## 2021-01-08 ENCOUNTER — Other Ambulatory Visit: Payer: Self-pay

## 2021-01-08 ENCOUNTER — Telehealth: Payer: Self-pay | Admitting: Cardiology

## 2021-01-08 DIAGNOSIS — I5041 Acute combined systolic (congestive) and diastolic (congestive) heart failure: Secondary | ICD-10-CM

## 2021-01-08 DIAGNOSIS — I447 Left bundle-branch block, unspecified: Secondary | ICD-10-CM

## 2021-01-08 DIAGNOSIS — I5043 Acute on chronic combined systolic (congestive) and diastolic (congestive) heart failure: Secondary | ICD-10-CM

## 2021-01-08 DIAGNOSIS — I428 Other cardiomyopathies: Secondary | ICD-10-CM

## 2021-01-08 MED ORDER — ATORVASTATIN CALCIUM 40 MG PO TABS: 40.0000 mg | ORAL_TABLET | Freq: Every day | ORAL | 0 refills | Status: DC

## 2021-01-08 NOTE — Telephone Encounter (Signed)
*  STAT* If patient is at the pharmacy, call can be transferred to refill team.   1. Which medications need to be refilled? (please list name of each medication and dose if known) atorvastatin (LIPITOR) 40 MG tablet  2. Which pharmacy/location (including street and city if local pharmacy) is medication to be sent to? [place] ([place]), [place] - [address]  3. Do they need a 30 day or 90 day supply?  90 day supply   Rescheduled appointment with Dr. Meda Coffee for 01/11/2021 -was canceled due to change in provider schedule- to 02/12/2021 with Estella Husk. This was the earliest in office appointment available with any of the PA's from all care teams. If unable to approve refill please contact patient

## 2021-01-14 ENCOUNTER — Ambulatory Visit (INDEPENDENT_AMBULATORY_CARE_PROVIDER_SITE_OTHER): Payer: Medicare HMO

## 2021-01-14 DIAGNOSIS — I5022 Chronic systolic (congestive) heart failure: Secondary | ICD-10-CM | POA: Diagnosis not present

## 2021-01-14 DIAGNOSIS — Z95810 Presence of automatic (implantable) cardiac defibrillator: Secondary | ICD-10-CM

## 2021-01-17 NOTE — Progress Notes (Signed)
EPIC Encounter for ICM Monitoring  Patient Name: Juan Norris is a 70 y.o. male Date: 01/17/2021 Primary Care Physican: Patient, No Pcp Per Primary Cardiologist:Nelson Electrophysiologist:Allred Bi-V Pacing:99.3% 01/17/2021 Weight:160lbs  Spoke with patient and reports feeling well at this time.  Denies fluid symptoms. He does not follow low salt diet.      OptivolThoracic impedancesuggesting possible fluid accumulation from 12/16/2020 until date of transmission 01/14/2021.  Prescribed: Spironolactone 25 mg take 0.5 tablet (12.5 mg total) daily  Labs: 09/04/2019 Creatinine 0.98, BUN 7, Potassium 4.3, Sodium 139, GFR 79-91  Recommendations:Recommendation to limit salt intake to 2000 mg daily and fluid intake to 64 oz daily.  Encouraged to call if experiencing any fluid symptoms.   Follow-up plan: ICM clinic phone appointment on 02/17/2021. 91 day device clinic remote transmission3/01/2021.   EP/Cardiology Office Visits: 02/12/2021 with Nena Polio, PA.  Copy of ICM check sent to Dr.Allred.    3 month ICM trend: 01/14/2021.    1 Year ICM trend:       Rosalene Billings, RN 01/17/2021 2:34 PM

## 2021-02-05 NOTE — Progress Notes (Signed)
Cardiology Office Note    Date:  02/12/2021   ID:  Juan Norris, DOB [birth year], MRN [id number]   PCP:  Patient, No Pcp Per   [facility]  Cardiologist:  Ena Dawley, MD  Advanced Practice Provider:  Imogene Burn, PA-C Electrophysiologist:  None   [id number]}   Chief Complaint  Patient presents with  . Follow-up    History of Present Illness:  Juan Norris is a 70 y.o. male with history of NSTEMI nonobstructive CAD on cath 4/17 there was a question of possible vasospasm, nonischemic cardiomyopathy EF 20 to 25% with diffuse hypokinesis and grade 1 2 DD.  No improvement in LVEF on Entresto and underwent placement of BiV ICD 10/2018, echo 12/2018 severe LV dysfunction EF 20 to 25%, chronic combined systolic and diastolic CHF, hypertension, HLD  Patient's OptiVol thoracic impedance from 12/16/2018 -01/14/21 suggested fluid accumulation and was started on spironolactone 12.5 mg daily.  He was not eating a low-sodium diet.  Patient comes in for f/u. Says he was with family in Jan and was eating extra salt. Has really changed his diet. Denies chest pain, palpitations, dyspnea, edema.  Past Medical History:  Diagnosis Date  . Acute renal insufficiency   . Anginal pain ([place])   . Chronic lower back pain    "due to 3 ruptured discs in my back" (11/08/2018)  . High cholesterol   . History of kidney stones 1982   "passed them"  . Hypertension   . Myocardial infarction ([place]) 03/2018  . Systolic heart failure ([place])   . Tobacco use     Past Surgical History:  Procedure Laterality Date  . BIV ICD INSERTION CRT-D N/A 11/08/2018    Medtronic biventricular ICD implantation by Dr Rayann Heman with His Bundle pacing and a MDT 5621 lead placed into the LV port.   Marland Kitchen CARDIAC CATHETERIZATION    . INGUINAL HERNIA REPAIR Bilateral   . KNEE SURGERY Left 1990s   "growth on the back of my knee removed"  . LEFT HEART CATH AND CORONARY ANGIOGRAPHY N/A 03/30/2018   Procedure: LEFT  HEART CATH AND CORONARY ANGIOGRAPHY;  Surgeon: Burnell Blanks, MD;  Location: [facility];  Service: Cardiovascular;  Laterality: N/A;    Current Medications: Current Meds  Medication Sig  . aspirin 81 MG EC tablet Take 1 tablet (81 mg total) by mouth daily.  . carvedilol (COREG) 6.25 MG tablet Take 1 tablet (6.25 mg total) by mouth 2 (two) times daily with a meal.  . Multiple Vitamin (MULTIVITAMIN WITH MINERALS) TABS tablet Take 1 tablet by mouth daily.  . sacubitril-valsartan (ENTRESTO) 24-26 MG Take 1 tablet by mouth 2 (two) times daily. Please make overdue appt with Dr. Meda Coffee before anymore refills. 1st attempt  . spironolactone (ALDACTONE) 25 MG tablet Take 0.5 tablets (12.5 mg total) by mouth daily.  . [DISCONTINUED] atorvastatin (LIPITOR) 40 MG tablet Take 1 tablet (40 mg total) by mouth daily. Please keep upcoming appt in March 2022 before anymore refills. Thank you     Allergies:   Patient has no known allergies.   Social History   Socioeconomic History  . Marital status: Divorced    Spouse name: Not on file  . Number of children: Not on file  . Years of education: Not on file  . Highest education level: Not on file  Occupational History  . Not on file  Tobacco Use  . Smoking status: Current Some Day Smoker  Packs/day: 0.14    Years: 47.00    Pack years: 6.58  . Smokeless tobacco: Never Used  Vaping Use  . Vaping Use: Never used  Substance and Sexual Activity  . Alcohol use: Not Currently    Comment: 11/08/2018 "stopped 09/23/1998"  . Drug use: Not Currently    Comment: 11/08/2018 "nothing in the 2000s"  . Sexual activity: Yes  Other Topics Concern  . Not on file  Social History Narrative  . Not on file   Social Determinants of Health   Financial Resource Strain: Not on file  Food Insecurity: Not on file  Transportation Needs: Not on file  Physical Activity: Not on file  Stress: Not on file  Social Connections: Not on file     Family  History:  [name] family history includes CAD in his brother.   ROS:   Please see the history of present illness.    ROS All other systems reviewed and are negative.   PHYSICAL EXAM:   VS:  BP 126/68   Pulse 61   Ht 5\' 7"  (1.702 m)   Wt 153 lb (69.4 kg)   SpO2 99%   BMI 23.96 kg/m   Physical Exam  STM:HDQQ, in no acute distress  Neck: no JVD, carotid bruits, or masses IWLNLGX:QJJ;9/4 systolic murmur LSB Respiratory: decreased breath sounds but clear to auscultation bilaterally, normal work of breathing GI: soft, nontender, nondistended, + BS Ext: without cyanosis, clubbing, or edema, Good distal pulses bilaterally Neuro:  Alert and Oriented x 3 Psych: euthymic mood, full affect  Wt Readings from Last 3 Encounters:  02/12/21 153 lb (69.4 kg)  10/31/20 153 lb 9.6 oz (69.7 kg)  09/04/19 166 lb 3.2 oz (75.4 kg)      Studies/Labs Reviewed:   EKG:  EKG is not ordered today.    Recent Labs: No results found for requested labs within last 8760 hours.   Lipid Panel    Component Value Date/Time   CHOL 92 (L) 09/04/2019 1028   TRIG 42 09/04/2019 1028   HDL 34 (L) 09/04/2019 1028   CHOLHDL 2.7 09/04/2019 1028   CHOLHDL 4.8 03/30/2018 0006   VLDL 13 03/30/2018 0006   LDLCALC 47 09/04/2019 1028    Additional studies/ records that were reviewed today include:  Echo: 12/2018   Left ventricle: The cavity size was normal. There was mild   concentric hypertrophy. Systolic function was severely reduced.   The estimated ejection fraction was in the range of 20% to 25%.   Diffuse hypokinesis. Doppler parameters are consistent with   abnormal left ventricular relaxation (grade 1 diastolic   dysfunction). Doppler parameters are consistent with high   ventricular filling pressure. - Ventricular septum: Septal motion showed abnormal function and   dyssynchrony. - Aortic valve: Transvalvular velocity was within the normal range.   There was no stenosis. There was mild to  moderate regurgitation. - Mitral valve: Transvalvular velocity was within the normal range.   There was no evidence for stenosis. There was mild regurgitation.   Valve area by pressure half-time: 1.25 cm^2. - Left atrium: The atrium was moderately dilated. - Right ventricle: The cavity size was normal. Wall thickness was   normal. Systolic function was normal. - Right atrium: The atrium was severely dilated. - Atrial septum: No defect or patent foramen ovale was identified   by color flow Doppler. - Tricuspid valve: There was mild regurgitation. - Pulmonary arteries: Systolic pressure was within the normal   range. PA peak  pressure: 25 mm Hg (S). - Global longitudinal stain -10.4% (abnormal).      Risk Assessment/Calculations:         ASSESSMENT:    1. NICM (nonischemic cardiomyopathy) ([place])   2. Chronic combined systolic and diastolic CHF (congestive heart failure) ([place])   3. Essential hypertension   4. Hyperlipidemia, unspecified hyperlipidemia type   5. LBBB (left bundle branch block)   6. Tobacco abuse      PLAN:  In order of problems listed above:   NICM: normal cath in 03/2018, LVEF 20-25%, repeat echocardiogram showed LVEF of 20 to 25%, functional class IIa, improved from to be prior to BiV ICD implantation.Continue Entresto 26/24 mg, metoprolol 12.5 p.o. daily,sprionolactone 12.5 mg daily.  No ICD firings, he is euvolemic.check labs.    Chronic combined systolic and diastolic CHF - as above, the patient is euvolemic.   Hypertension - controlled    Hyperlipidemia -continue atorvastatin 40 mg daily.check FLP today  LBBB chronic   Tobacco abuse-smokes 1/3 ppd. Smoking cessation discussed. He's not willing to quit.  Shared Decision Making/Informed Consent        Medication Adjustments/Labs and Tests Ordered: Current medicines are reviewed at length with the patient today.  Concerns regarding medicines are outlined above.  Medication changes, Labs and Tests  ordered today are listed in the Patient Instructions below. Patient Instructions  Medication Instructions:  Your physician recommends that you continue on your current medications as directed. Please refer to the Current Medication list given to you today.  *If you need a refill on your cardiac medications before your next appointment, please call your pharmacy*   Lab Work: TODAY: CMET, CBC, FLP  If you have labs (blood work) drawn today and your tests are completely normal, you will receive your results only by: Marland Kitchen MyChart Message (if you have MyChart) OR . A paper copy in the mail If you have any lab test that is abnormal or we need to change your treatment, we will call you to review the results.   Follow-Up: At [facility], you and your health needs are our priority.  As part of our continuing mission to provide you with exceptional heart care, we have created designated Provider Care Teams.  These Care Teams include your primary Cardiologist (physician) and Advanced Practice Providers (APPs -  Physician Assistants and Nurse Practitioners) who all work together to provide you with the care you need, when you need it.  We recommend signing up for the patient portal called "MyChart".  Sign up information is provided on this After Visit Summary.  MyChart is used to connect with patients for Virtual Visits (Telemedicine).  Patients are able to view lab/test results, encounter notes, upcoming appointments, etc.  Non-urgent messages can be sent to your provider as well.   To learn more about what you can do with MyChart, go to [email or web address].    Your next appointment:   1 year(s)  The format for your next appointment:   In Person  Provider:   You may see Gwyndolyn Kaufman, MD or one of the following Advanced Practice Providers on your designated Care Team:    Richardson Dopp, PA-C  Vin [address] PA-C       Signed, Ermalinda Barrios, [place]  02/12/2021 7:52 AM    [address], [place], [place]  [address] Phone: [phone]; Fax: [phone]

## 2021-02-07 ENCOUNTER — Other Ambulatory Visit: Payer: Self-pay | Admitting: Cardiology

## 2021-02-07 DIAGNOSIS — I447 Left bundle-branch block, unspecified: Secondary | ICD-10-CM

## 2021-02-07 DIAGNOSIS — I5041 Acute combined systolic (congestive) and diastolic (congestive) heart failure: Secondary | ICD-10-CM

## 2021-02-07 DIAGNOSIS — I5043 Acute on chronic combined systolic (congestive) and diastolic (congestive) heart failure: Secondary | ICD-10-CM

## 2021-02-07 DIAGNOSIS — I428 Other cardiomyopathies: Secondary | ICD-10-CM

## 2021-02-12 ENCOUNTER — Ambulatory Visit (INDEPENDENT_AMBULATORY_CARE_PROVIDER_SITE_OTHER): Payer: Medicare HMO

## 2021-02-12 ENCOUNTER — Ambulatory Visit (INDEPENDENT_AMBULATORY_CARE_PROVIDER_SITE_OTHER): Payer: Medicare HMO | Admitting: Physician Assistant

## 2021-02-12 ENCOUNTER — Encounter: Payer: Self-pay | Admitting: Physician Assistant

## 2021-02-12 ENCOUNTER — Other Ambulatory Visit: Payer: Self-pay

## 2021-02-12 VITALS — BP 126/68 | HR 61 | Ht 67.0 in | Wt 153.0 lb

## 2021-02-12 DIAGNOSIS — I5042 Chronic combined systolic (congestive) and diastolic (congestive) heart failure: Secondary | ICD-10-CM

## 2021-02-12 DIAGNOSIS — I447 Left bundle-branch block, unspecified: Secondary | ICD-10-CM

## 2021-02-12 DIAGNOSIS — I428 Other cardiomyopathies: Secondary | ICD-10-CM | POA: Diagnosis not present

## 2021-02-12 DIAGNOSIS — E785 Hyperlipidemia, unspecified: Secondary | ICD-10-CM | POA: Diagnosis not present

## 2021-02-12 DIAGNOSIS — I10 Essential (primary) hypertension: Secondary | ICD-10-CM | POA: Diagnosis not present

## 2021-02-12 DIAGNOSIS — Z720 Tobacco use: Secondary | ICD-10-CM

## 2021-02-12 LAB — COMPREHENSIVE METABOLIC PANEL
ALT: 10 IU/L (ref 0–44)
AST: 18 IU/L (ref 0–40)
Albumin/Globulin Ratio: 1.4 (ref 1.2–2.2)
Albumin: 4.1 g/dL (ref 3.8–4.8)
Alkaline Phosphatase: 99 IU/L (ref 44–121)
BUN/Creatinine Ratio: 12 (ref 10–24)
BUN: 11 mg/dL (ref 8–27)
Bilirubin Total: 0.5 mg/dL (ref 0.0–1.2)
CO2: 21 mmol/L (ref 20–29)
Calcium: 9 mg/dL (ref 8.6–10.2)
Chloride: 105 mmol/L (ref 96–106)
Creatinine, Ser: 0.95 mg/dL (ref 0.76–1.27)
Globulin, Total: 3 g/dL (ref 1.5–4.5)
Glucose: 148 mg/dL — ABNORMAL HIGH (ref 65–99)
Potassium: 4.4 mmol/L (ref 3.5–5.2)
Sodium: 139 mmol/L (ref 134–144)
Total Protein: 7.1 g/dL (ref 6.0–8.5)
eGFR: 87 mL/min/{1.73_m2} (ref >59)

## 2021-02-12 LAB — LIPID PANEL
Chol/HDL Ratio: 2.8 ratio (ref 0.0–5.0)
Cholesterol, Total: 93 mg/dL — ABNORMAL LOW (ref 100–199)
HDL: 33 mg/dL — ABNORMAL LOW (ref >39)
LDL Chol Calc (NIH): 48 mg/dL (ref 0–99)
Triglycerides: 44 mg/dL (ref 0–149)
VLDL Cholesterol Cal: 12 mg/dL (ref 5–40)

## 2021-02-12 LAB — CBC
Hematocrit: 40.1 % (ref 37.5–51.0)
Hemoglobin: 13.6 g/dL (ref 13.0–17.7)
MCH: 29.8 pg (ref 26.6–33.0)
MCHC: 33.9 g/dL (ref 31.5–35.7)
MCV: 88 fL (ref 79–97)
Platelets: 219 10*3/uL (ref 150–450)
RBC: 4.56 x10E6/uL (ref 4.14–5.80)
RDW: 12.1 % (ref 11.6–15.4)
WBC: 9.3 10*3/uL (ref 3.4–10.8)

## 2021-02-12 MED ORDER — ATORVASTATIN CALCIUM 40 MG PO TABS: 40.0000 mg | ORAL_TABLET | Freq: Every day | ORAL | 3 refills | Status: DC

## 2021-02-12 NOTE — Addendum Note (Signed)
Addended by: Carylon Perches on: 02/12/2021 07:58 AM   Modules accepted: Orders

## 2021-02-12 NOTE — Patient Instructions (Signed)
Medication Instructions:  Your physician recommends that you continue on your current medications as directed. Please refer to the Current Medication list given to you today.  *If you need a refill on your cardiac medications before your next appointment, please call your pharmacy*   Lab Work: TODAY: CMET, CBC, FLP  If you have labs (blood work) drawn today and your tests are completely normal, you will receive your results only by: Marland Kitchen MyChart Message (if you have MyChart) OR . A paper copy in the mail If you have any lab test that is abnormal or we need to change your treatment, we will call you to review the results.   Follow-Up: At [facility], you and your health needs are our priority.  As part of our continuing mission to provide you with exceptional heart care, we have created designated Provider Care Teams.  These Care Teams include your primary Cardiologist (physician) and Advanced Practice Providers (APPs -  Physician Assistants and Nurse Practitioners) who all work together to provide you with the care you need, when you need it.  We recommend signing up for the patient portal called "MyChart".  Sign up information is provided on this After Visit Summary.  MyChart is used to connect with patients for Virtual Visits (Telemedicine).  Patients are able to view lab/test results, encounter notes, upcoming appointments, etc.  Non-urgent messages can be sent to your provider as well.   To learn more about what you can do with MyChart, go to [email or web address].    Your next appointment:   1 year(s)  The format for your next appointment:   In Person  Provider:   You may see Gwyndolyn Kaufman, MD or one of the following Advanced Practice Providers on your designated Care Team:    Richardson Dopp, PA-C  Vin [place], [place]

## 2021-02-14 LAB — CUP PACEART REMOTE DEVICE CHECK
Battery Remaining Longevity: 43 mo
Battery Voltage: 2.96 V
Brady Statistic AP VP Percent: 47.21 %
Brady Statistic AP VS Percent: 0.01 %
Brady Statistic AS VP Percent: 52.73 %
Brady Statistic AS VS Percent: 0.05 %
Brady Statistic RA Percent Paced: 46.95 %
Brady Statistic RV Percent Paced: 99.44 %
Date Time Interrogation Session: 20220302042408
HighPow Impedance: 55 Ohm
Implantable Lead Implant Date: 20191126
Implantable Lead Implant Date: 20191126
Implantable Lead Implant Date: 20191126
Implantable Lead Location: 753859
Implantable Lead Location: 753860
Implantable Lead Location: 753860
Implantable Lead Model: 3830
Implantable Lead Model: 5076
Implantable Pulse Generator Implant Date: 20191126
Lead Channel Impedance Value: 228 Ohm
Lead Channel Impedance Value: 266 Ohm
Lead Channel Impedance Value: 285 Ohm
Lead Channel Impedance Value: 304 Ohm
Lead Channel Impedance Value: 418 Ohm
Lead Channel Impedance Value: 437 Ohm
Lead Channel Pacing Threshold Amplitude: 0.5 V
Lead Channel Pacing Threshold Amplitude: 0.5 V
Lead Channel Pacing Threshold Amplitude: 0.625 V
Lead Channel Pacing Threshold Pulse Width: 0.4 ms
Lead Channel Pacing Threshold Pulse Width: 0.4 ms
Lead Channel Pacing Threshold Pulse Width: 0.4 ms
Lead Channel Sensing Intrinsic Amplitude: 2.125 mV
Lead Channel Sensing Intrinsic Amplitude: 2.125 mV
Lead Channel Sensing Intrinsic Amplitude: 8.625 mV
Lead Channel Sensing Intrinsic Amplitude: 8.625 mV
Lead Channel Setting Pacing Amplitude: 1.5 V
Lead Channel Setting Pacing Amplitude: 2.5 V
Lead Channel Setting Pacing Amplitude: 2.5 V
Lead Channel Setting Pacing Pulse Width: 0.4 ms
Lead Channel Setting Pacing Pulse Width: 0.4 ms
Lead Channel Setting Sensing Sensitivity: 0.3 mV

## 2021-02-17 ENCOUNTER — Ambulatory Visit (INDEPENDENT_AMBULATORY_CARE_PROVIDER_SITE_OTHER): Payer: Medicare HMO

## 2021-02-17 DIAGNOSIS — I5042 Chronic combined systolic (congestive) and diastolic (congestive) heart failure: Secondary | ICD-10-CM

## 2021-02-17 DIAGNOSIS — Z95810 Presence of automatic (implantable) cardiac defibrillator: Secondary | ICD-10-CM | POA: Diagnosis not present

## 2021-02-19 NOTE — Progress Notes (Signed)
EPIC Encounter for ICM Monitoring  Patient Name: Juan Norris is a 70 y.o. male Date: 02/19/2021 Primary Care Physican: Patient, No Pcp Per Primary Cardiologist:Nelson Electrophysiologist:Allred Bi-V Pacing:99.2% 02/12/2021 Office Weight:153lbs  Transmission reviewed.      OptivolThoracic impedancesuggesting normal fluid levels.  Prescribed: Spironolactone 25 mg take 0.5 tablet (12.5 mg total) daily  Labs: 02/12/2021 Creatinine 0.95, BUN 11, Potassium 4.4, Sodium 139, GFR 87 A complete set of results can be found in Results Review.  Recommendations:No changes.  Follow-up plan: ICM clinic phone appointment on4/10/2021. 91 day device clinic remote transmission6/12/2020.   EP/Cardiology Office Visits:Recall 10/31/2021 with Dr Rayann Heman.  Recall with Dr Johney Frame 02/07/2022.  Copy of ICM check sent to Dr.Allred.   3 month ICM trend: 02/17/2021.    1 Year ICM trend:       Rosalene Billings, RN 02/19/2021 3:11 PM

## 2021-02-20 NOTE — Progress Notes (Signed)
Remote ICD transmission.   

## 2021-02-24 ENCOUNTER — Other Ambulatory Visit: Payer: Self-pay | Admitting: Cardiology

## 2021-02-24 DIAGNOSIS — I447 Left bundle-branch block, unspecified: Secondary | ICD-10-CM

## 2021-03-24 ENCOUNTER — Ambulatory Visit (INDEPENDENT_AMBULATORY_CARE_PROVIDER_SITE_OTHER): Payer: Medicare HMO

## 2021-03-24 DIAGNOSIS — Z95810 Presence of automatic (implantable) cardiac defibrillator: Secondary | ICD-10-CM | POA: Diagnosis not present

## 2021-03-24 DIAGNOSIS — I5042 Chronic combined systolic (congestive) and diastolic (congestive) heart failure: Secondary | ICD-10-CM

## 2021-03-26 NOTE — Progress Notes (Signed)
EPIC Encounter for ICM Monitoring  Patient Name: Juan Norris is a 70 y.o. male Date: 03/26/2021 Primary Care Physican: Patient, No Pcp Per (Inactive) Primary Cardiologist:Nelson Electrophysiologist:Allred Bi-V Pacing:99.1% 02/12/2021 OfficeWeight:153lbs  Spoke with patient and reports feeling well at this time.  Denies fluid symptoms.  His only complaint is having leg cramps occasionally.   OptivolThoracic impedancesuggesting normal fluid levels.  Prescribed: Spironolactone 25 mg take 0.5 tablet (12.5 mg total) daily  Labs: 02/12/2021 Creatinine 0.95, BUN 11, Potassium 4.4, Sodium 139, GFR 87 A complete set of results can be found in Results Review.  Recommendations:No changes and encouraged to call if experiencing any fluid symptoms.  Follow-up plan: ICM clinic phone appointment on6/01/2021. 91 day device clinic remote transmission6/12/2020.   EP/Cardiology Office Visits:Recall 10/31/2021 with Dr Rayann Heman.  Recall with Dr Johney Frame 02/07/2022.  Copy of ICM check sent to Dr.Allred.  3 month ICM trend: 03/24/2021.    1 Year ICM trend:       Rosalene Billings, RN 03/26/2021 11:28 AM

## 2021-05-14 ENCOUNTER — Ambulatory Visit (INDEPENDENT_AMBULATORY_CARE_PROVIDER_SITE_OTHER): Payer: Medicare HMO

## 2021-05-14 DIAGNOSIS — I428 Other cardiomyopathies: Secondary | ICD-10-CM

## 2021-05-14 LAB — CUP PACEART REMOTE DEVICE CHECK
Battery Remaining Longevity: 39 mo
Battery Voltage: 2.96 V
Brady Statistic AP VP Percent: 45.97 %
Brady Statistic AP VS Percent: 0.02 %
Brady Statistic AS VP Percent: 53.89 %
Brady Statistic AS VS Percent: 0.12 %
Brady Statistic RA Percent Paced: 45.15 %
Brady Statistic RV Percent Paced: 98.33 %
Date Time Interrogation Session: 20220601012403
HighPow Impedance: 60 Ohm
Implantable Lead Implant Date: 20191126
Implantable Lead Implant Date: 20191126
Implantable Lead Implant Date: 20191126
Implantable Lead Location: 753859
Implantable Lead Location: 753860
Implantable Lead Location: 753860
Implantable Lead Model: 3830
Implantable Lead Model: 5076
Implantable Pulse Generator Implant Date: 20191126
Lead Channel Impedance Value: 228 Ohm
Lead Channel Impedance Value: 266 Ohm
Lead Channel Impedance Value: 285 Ohm
Lead Channel Impedance Value: 342 Ohm
Lead Channel Impedance Value: 418 Ohm
Lead Channel Impedance Value: 475 Ohm
Lead Channel Pacing Threshold Amplitude: 0.375 V
Lead Channel Pacing Threshold Amplitude: 0.5 V
Lead Channel Pacing Threshold Amplitude: 0.625 V
Lead Channel Pacing Threshold Pulse Width: 0.4 ms
Lead Channel Pacing Threshold Pulse Width: 0.4 ms
Lead Channel Pacing Threshold Pulse Width: 0.4 ms
Lead Channel Sensing Intrinsic Amplitude: 2.375 mV
Lead Channel Sensing Intrinsic Amplitude: 2.375 mV
Lead Channel Sensing Intrinsic Amplitude: 6.625 mV
Lead Channel Sensing Intrinsic Amplitude: 6.625 mV
Lead Channel Setting Pacing Amplitude: 1.5 V
Lead Channel Setting Pacing Amplitude: 2.5 V
Lead Channel Setting Pacing Amplitude: 2.5 V
Lead Channel Setting Pacing Pulse Width: 0.4 ms
Lead Channel Setting Pacing Pulse Width: 0.4 ms
Lead Channel Setting Sensing Sensitivity: 0.3 mV

## 2021-05-16 ENCOUNTER — Ambulatory Visit (INDEPENDENT_AMBULATORY_CARE_PROVIDER_SITE_OTHER): Payer: Medicare HMO

## 2021-05-16 DIAGNOSIS — Z95810 Presence of automatic (implantable) cardiac defibrillator: Secondary | ICD-10-CM

## 2021-05-16 DIAGNOSIS — I5042 Chronic combined systolic (congestive) and diastolic (congestive) heart failure: Secondary | ICD-10-CM

## 2021-05-16 NOTE — Progress Notes (Signed)
EPIC Encounter for ICM Monitoring  Patient Name: Juan Norris is a 70 y.o. male Date: 05/16/2021 Primary Care Physican: Patient, No Pcp Per (Inactive) Primary Cardiologist:Nelson Electrophysiologist:Allred Bi-V Pacing:98.3% 02/12/2021 OfficeWeight:153lbs  Spoke with patient and reports feeling well at this time.  Denies fluid symptoms.    OptivolThoracic impedancesuggestingnormal fluid levels.  Prescribed: Spironolactone 25 mg take 0.5 tablet (12.5 mg total) daily  Labs: 02/12/2021 Creatinine0.95, BUN11, Potassium4.4, Sodium139, [id number] A complete set of results can be found in Results Review.  Recommendations:No changes and encouraged to call if experiencing any fluid symptoms.  Follow-up plan: ICM clinic phone appointment on7/04/2021. 91 day device clinic remote transmission8/31/2022.   EP/Cardiology Office Visits:Recall 10/31/2021 with Dr Rayann Heman. Recall with Dr Johney Frame 02/07/2022.  Copy of ICM check sent to Dr.Allred.  3 month ICM trend: 05/14/2021.    1 Year ICM trend:       Rosalene Billings, RN 05/16/2021 4:15 PM

## 2021-06-06 NOTE — Progress Notes (Signed)
Remote ICD transmission.   

## 2021-06-17 ENCOUNTER — Ambulatory Visit (INDEPENDENT_AMBULATORY_CARE_PROVIDER_SITE_OTHER): Payer: Medicare HMO

## 2021-06-17 DIAGNOSIS — Z95810 Presence of automatic (implantable) cardiac defibrillator: Secondary | ICD-10-CM | POA: Diagnosis not present

## 2021-06-17 DIAGNOSIS — I5042 Chronic combined systolic (congestive) and diastolic (congestive) heart failure: Secondary | ICD-10-CM

## 2021-06-18 NOTE — Progress Notes (Signed)
EPIC Encounter for ICM Monitoring  Patient Name: DELLAS GUARD is a 70 y.o. male Date: 06/18/2021 Primary Care Physican: Patient, No Pcp Per (Inactive) Primary Cardiologist: Meda Coffee Electrophysiologist: Allred Bi-V Pacing:   98.5%       06/18/2021 Weight: 153 lbs                                                          Spoke with patient and heart failure questions reviewed.  Pt asymptomatic for fluid accumulation and feeing well.    Optivol Thoracic impedance suggesting normal fluid levels.    Prescribed: Spironolactone 25 mg take 0.5 tablet (12.5 mg total) daily   Labs: 02/12/2021 Creatinine 0.95, BUN 11, Potassium 4.4, Sodium 139, GFR 87 A complete set of results can be found in Results Review.   Recommendations:  No changes and encouraged to call if experiencing any fluid symptoms.   Follow-up plan: ICM clinic phone appointment on 07/21/2021.   91 day device clinic remote transmission 08/13/2021.      EP/Cardiology Office Visits: Recall 10/31/2021 with Dr Rayann Heman.  Recall with Dr Johney Frame 02/07/2022.   Copy of ICM check sent to Dr. Rayann Heman.    3 month ICM trend: 06/17/2021.    1 Year ICM trend:       Rosalene Billings, RN 06/18/2021 8:25 AM

## 2021-06-20 ENCOUNTER — Other Ambulatory Visit (HOSPITAL_COMMUNITY): Payer: Self-pay | Admitting: Neurosurgery

## 2021-06-20 DIAGNOSIS — M5416 Radiculopathy, lumbar region: Secondary | ICD-10-CM

## 2021-07-10 ENCOUNTER — Ambulatory Visit (HOSPITAL_COMMUNITY)
Admission: RE | Admit: 2021-07-10 | Discharge: 2021-07-10 | Disposition: A | Discharge status: Home / Self Care | Payer: No Typology Code available for payment source | Source: Ambulatory Visit | Attending: Neurosurgery | Admitting: Neurosurgery

## 2021-07-10 ENCOUNTER — Other Ambulatory Visit: Payer: Self-pay

## 2021-07-10 DIAGNOSIS — M5416 Radiculopathy, lumbar region: Secondary | ICD-10-CM | POA: Diagnosis present

## 2021-07-10 IMAGING — MR MR LUMBAR SPINE W/O CM
4 of 5 series · 18 of 48 positions shown · non-contrast
Comparison: Lumbar spine CT [DATE]

CLINICAL DATA: Lumbar back pain with radiculopathy affecting right
lower extremity.

EXAM:
MRI LUMBAR SPINE WITHOUT CONTRAST
TECHNIQUE: Multiplanar, multisequence MR imaging of the lumbar spine was
performed. No intravenous contrast was administered.

[Series 4: T2 · sagittal · 4.0mm · 0.55mm/px · 5 of 14 slices shown (1 of 2)]
[im 1/14]
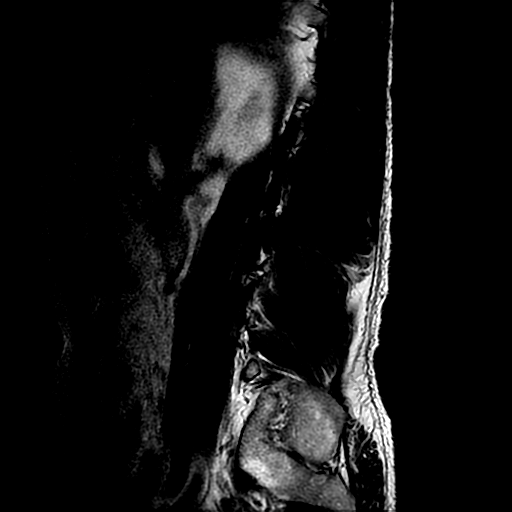
[im 4/14]
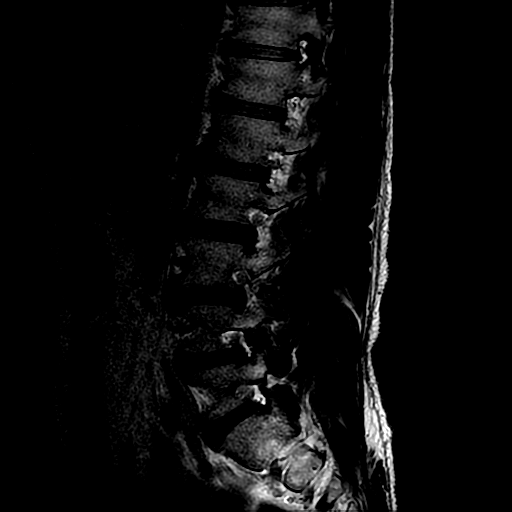
[im 7/14]
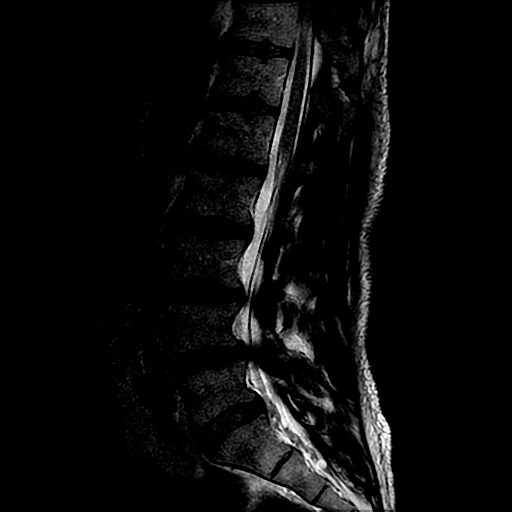
[im 10/14]
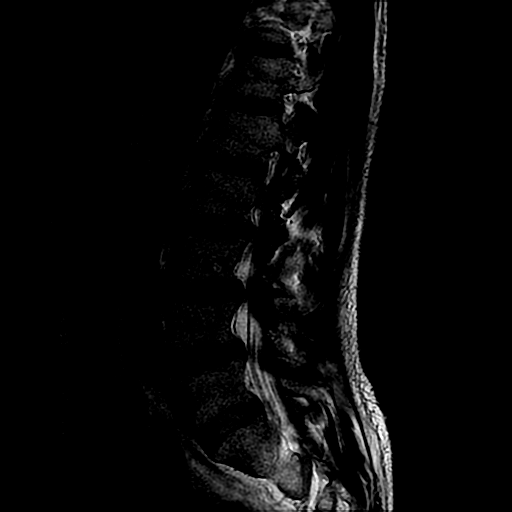
[im 14/14]
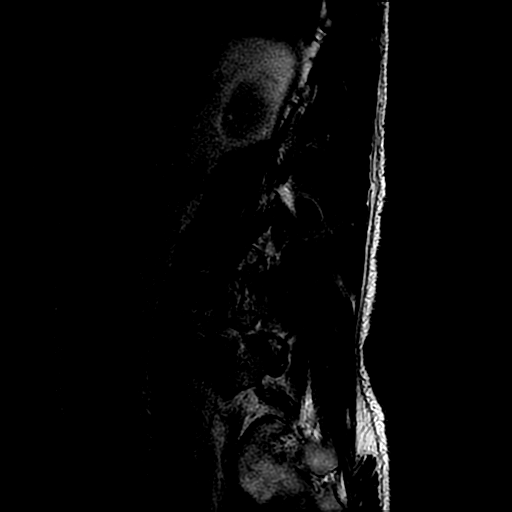

[Series 6: T1 · sagittal · 4.0mm · 0.55mm/px · 3 of 14 slices shown (1 of 2)]
[im 3/14]
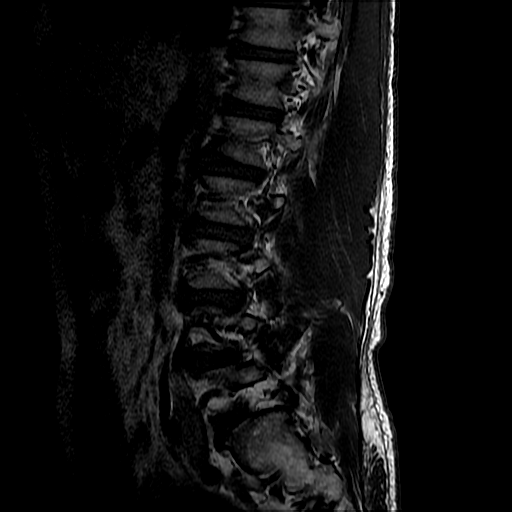
[im 8/14]
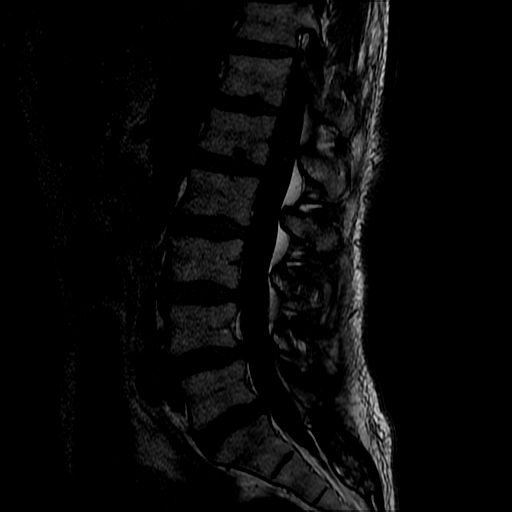
[im 14/14]
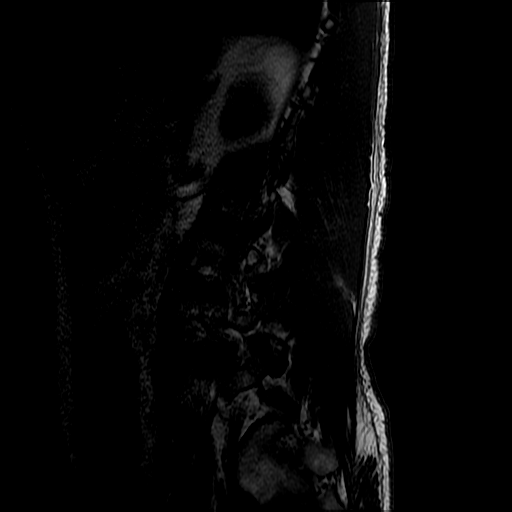

[Series 7: T2 · axial · 4.0mm · 0.39mm/px · z∈[-68,+107]mm · 7 of 39 slices shown (2 of 2)]
[im 3/39]
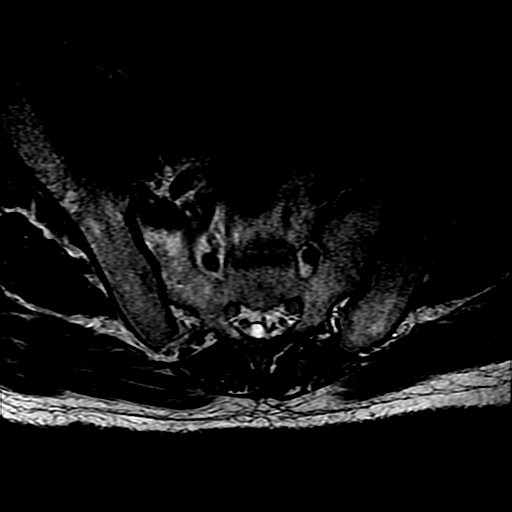
[im 6/39]
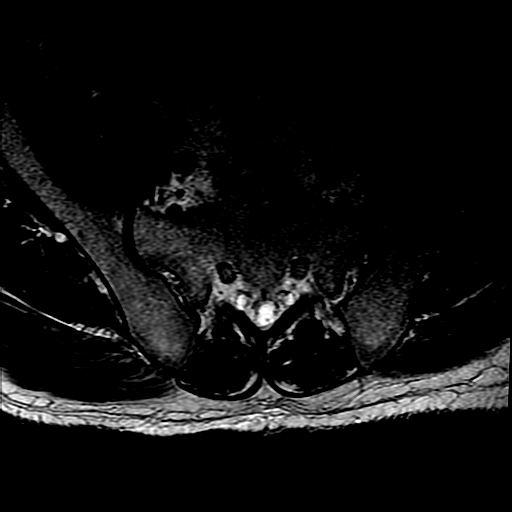
[im 8/39]
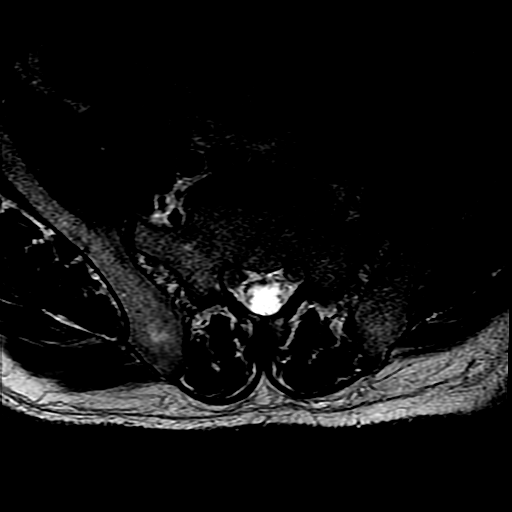
[im 13/39]
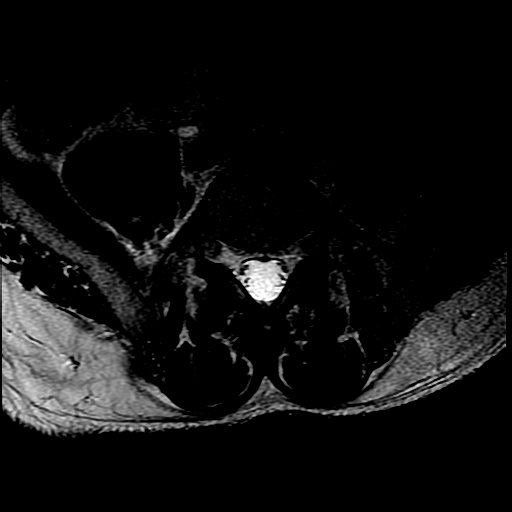
[im 18/39]
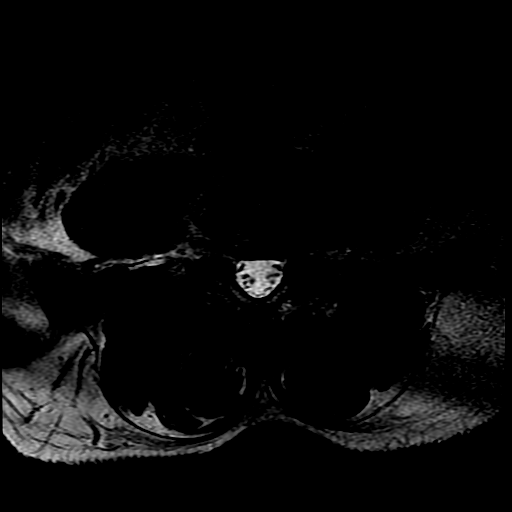
[im 21/39]
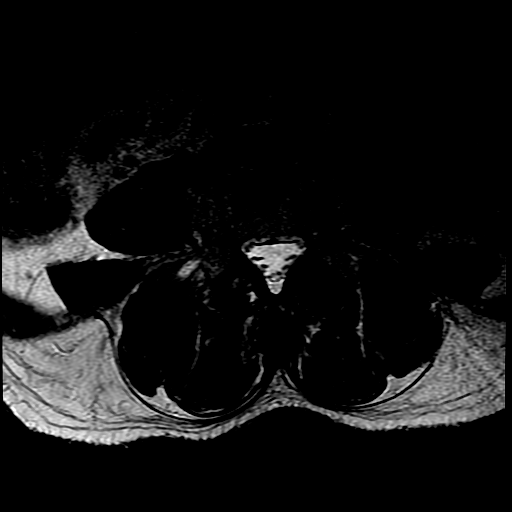
[im 33/39]
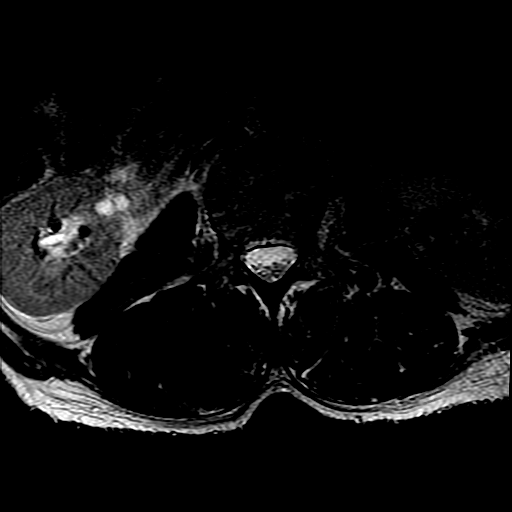

[Series 8: T1 · axial · 4.0mm · 0.39mm/px · z∈[-53,+107]mm · 3 of 39 slices shown (2 of 2)]
[im 6/39]
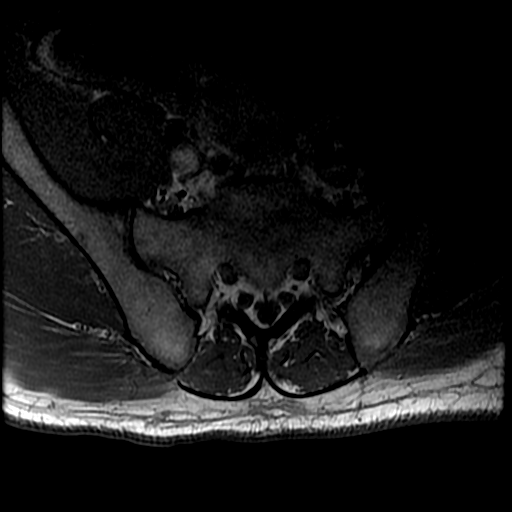
[im 21/39]
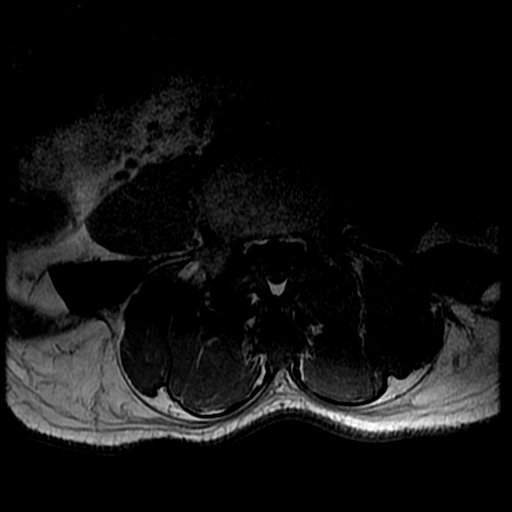
[im 33/39]
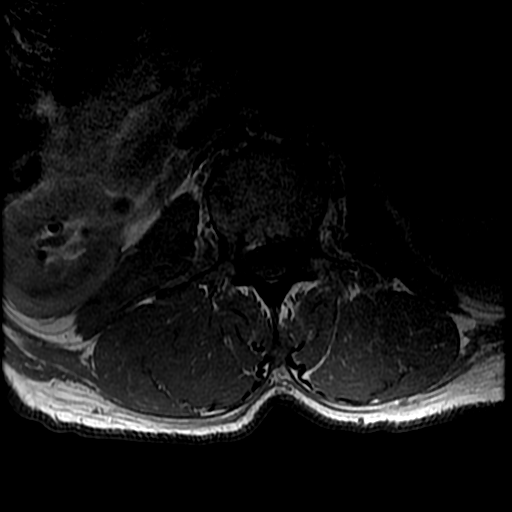

[18 of 48 positions shown; findings below may reference images not displayed]

FINDINGS: Segmentation:  Standard.

Alignment:  Trace anterolisthesis of L4 on L5.

Vertebrae: No fracture, suspicious marrow lesion, or significant
marrow edema. Small Schmorl's nodes in the upper lumbar and lower
thoracic spine.

Conus medullaris and cauda equina: Conus extends to the L1-2 level.
Conus and cauda equina appear normal.

Paraspinal and other soft tissues: Partially visualized small
bilateral renal cysts.

Disc levels:

Mild lumbar disc desiccation, greatest at L3-4 and L4-5. Slight disc
space narrowing at L4-5.

T11-12: Only imaged sagittally. Facet arthrosis without evidence of
stenosis.

T12-L1: Facet arthrosis without evidence of stenosis.

L1-2: Slight facet hypertrophy without disc herniation or stenosis.

L2-3: Disc bulging and mild facet hypertrophy result in mild
bilateral lateral recess stenosis and moderate bilateral neural
foraminal stenosis without spinal stenosis, similar to the prior CT.

L3-4: Disc bulging and moderate facet and ligamentum flavum
hypertrophy result in moderate bilateral lateral recess stenosis and
moderate bilateral neural foraminal stenosis without spinal
stenosis, similar to the prior CT.

L4-5: Disc bulging, mild ligamentum flavum hypertrophy, and severe
facet hypertrophy result in moderate bilateral lateral recess
stenosis and moderate bilateral neural foraminal stenosis without
spinal stenosis, similar to the prior CT.

L5-S1: Minimal disc bulging and mild facet hypertrophy result in
minimal to mild bilateral neural foraminal stenosis without spinal
stenosis, similar to the prior CT.
IMPRESSION: 1. Multilevel lumbar disc and facet degeneration with moderate
lateral recess stenosis at L3-4 and L4-5.
2. Moderate bilateral neural foraminal stenosis from L2-3 to L4-5.

## 2021-07-10 NOTE — Progress Notes (Signed)
Per order, Changed device settings for MRI to  DOO at 70 bpm  Tachy-therapies to off/MRI mode  Will program device back to pre-MRI settings after completion of exam, and send transmission

## 2021-07-10 NOTE — Progress Notes (Signed)
Received call from PA and order changed for patient to be set at [place] 75.  Changed patient's rate per PA/order

## 2021-07-21 ENCOUNTER — Ambulatory Visit (INDEPENDENT_AMBULATORY_CARE_PROVIDER_SITE_OTHER): Payer: Medicare HMO

## 2021-07-21 DIAGNOSIS — Z95810 Presence of automatic (implantable) cardiac defibrillator: Secondary | ICD-10-CM | POA: Diagnosis not present

## 2021-07-21 DIAGNOSIS — I5042 Chronic combined systolic (congestive) and diastolic (congestive) heart failure: Secondary | ICD-10-CM

## 2021-07-23 NOTE — Progress Notes (Signed)
EPIC Encounter for ICM Monitoring  Patient Name: Juan Norris is a 70 y.o. male Date: 07/23/2021 Primary Care Physican: Patient, No Pcp Per (Inactive) Primary Cardiologist: Meda Coffee Electrophysiologist: Allred Bi-V Pacing:   99.2%       06/18/2021 Weight: 153 lbs                                                          Spoke with patient and heart failure questions reviewed.  Pt asymptomatic for fluid accumulation and feeling well.   Optivol Thoracic impedance suggesting normal fluid levels.    Prescribed: Spironolactone 25 mg take 0.5 tablet (12.5 mg total) daily   Labs: 02/12/2021 Creatinine 0.95, BUN 11, Potassium 4.4, Sodium 139, GFR 87 A complete set of results can be found in Results Review.   Recommendations:  No changes and encouraged to call if experiencing any fluid symptoms.   Follow-up plan: ICM clinic phone appointment on 08/25/2021.   91 day device clinic remote transmission 08/13/2021.      EP/Cardiology Office Visits: Recall 10/31/2021 with Dr Rayann Heman.  Recall with Dr Johney Frame 02/07/2022.   Copy of ICM check sent to Dr. Rayann Heman.     3 month ICM trend: 07/21/2021.    1 Year ICM trend:       Rosalene Billings, RN 07/23/2021 9:01 AM

## 2021-08-13 ENCOUNTER — Ambulatory Visit (INDEPENDENT_AMBULATORY_CARE_PROVIDER_SITE_OTHER): Payer: No Typology Code available for payment source

## 2021-08-13 ENCOUNTER — Other Ambulatory Visit: Payer: Self-pay | Admitting: *Deleted

## 2021-08-13 DIAGNOSIS — I428 Other cardiomyopathies: Secondary | ICD-10-CM | POA: Diagnosis not present

## 2021-08-15 ENCOUNTER — Other Ambulatory Visit (HOSPITAL_COMMUNITY): Payer: Self-pay | Admitting: Physician Assistant

## 2021-08-15 ENCOUNTER — Other Ambulatory Visit: Payer: Self-pay | Admitting: Physician Assistant

## 2021-08-15 DIAGNOSIS — R911 Solitary pulmonary nodule: Secondary | ICD-10-CM

## 2021-08-19 LAB — CUP PACEART REMOTE DEVICE CHECK
Battery Remaining Longevity: 32 mo
Battery Voltage: 2.96 V
Brady Statistic AP VP Percent: 42.56 %
Brady Statistic AP VS Percent: 0.02 %
Brady Statistic AS VP Percent: 57.12 %
Brady Statistic AS VS Percent: 0.3 %
Brady Statistic RA Percent Paced: 41.72 %
Brady Statistic RV Percent Paced: 97.9 %
Date Time Interrogation Session: 20220831033624
HighPow Impedance: 50 Ohm
Implantable Lead Implant Date: 20191126
Implantable Lead Implant Date: 20191126
Implantable Lead Implant Date: 20191126
Implantable Lead Location: 753859
Implantable Lead Location: 753860
Implantable Lead Location: 753860
Implantable Lead Model: 3830
Implantable Lead Model: 5076
Implantable Pulse Generator Implant Date: 20191126
Lead Channel Impedance Value: 209 Ohm
Lead Channel Impedance Value: 266 Ohm
Lead Channel Impedance Value: 266 Ohm
Lead Channel Impedance Value: 285 Ohm
Lead Channel Impedance Value: 399 Ohm
Lead Channel Impedance Value: 418 Ohm
Lead Channel Pacing Threshold Amplitude: 0.375 V
Lead Channel Pacing Threshold Amplitude: 0.5 V
Lead Channel Pacing Threshold Amplitude: 0.625 V
Lead Channel Pacing Threshold Pulse Width: 0.4 ms
Lead Channel Pacing Threshold Pulse Width: 0.4 ms
Lead Channel Pacing Threshold Pulse Width: 0.4 ms
Lead Channel Sensing Intrinsic Amplitude: 2 mV
Lead Channel Sensing Intrinsic Amplitude: 2 mV
Lead Channel Sensing Intrinsic Amplitude: 5.75 mV
Lead Channel Sensing Intrinsic Amplitude: 5.75 mV
Lead Channel Setting Pacing Amplitude: 1.5 V
Lead Channel Setting Pacing Amplitude: 2.5 V
Lead Channel Setting Pacing Amplitude: 2.5 V
Lead Channel Setting Pacing Pulse Width: 0.4 ms
Lead Channel Setting Pacing Pulse Width: 0.4 ms
Lead Channel Setting Sensing Sensitivity: 0.3 mV

## 2021-08-20 ENCOUNTER — Encounter: Payer: Self-pay | Admitting: Emergency Medicine

## 2021-08-20 ENCOUNTER — Other Ambulatory Visit: Payer: Self-pay

## 2021-08-20 ENCOUNTER — Ambulatory Visit (INDEPENDENT_AMBULATORY_CARE_PROVIDER_SITE_OTHER): Payer: No Typology Code available for payment source | Admitting: Emergency Medicine

## 2021-08-20 DIAGNOSIS — R911 Solitary pulmonary nodule: Secondary | ICD-10-CM | POA: Diagnosis not present

## 2021-08-20 DIAGNOSIS — Z720 Tobacco use: Secondary | ICD-10-CM

## 2021-08-20 NOTE — Assessment & Plan Note (Signed)
He has already cut down and is interested in cessation.  We will support him in this

## 2021-08-20 NOTE — Addendum Note (Signed)
Addended by: Gavin Potters R on: 08/20/2021 05:24 PM   Modules accepted: Orders

## 2021-08-20 NOTE — Progress Notes (Signed)
Subjective:    Patient ID: Juan Norris, male    DOB: 1951/06/18, 70 y.o.   MRN: [id number]  HPI 70 year old gentleman, active smoker (34 pack years, has cut down to 2 cig a day) with a history of CAD, systolic CHF w AICD, hypertension, hypercholesterolemia, renal insufficiency.  He is referred today to review abnormal CT chest.   He has no breathing limitations, is active, likes to walk. No cough or wheeze. Good functional capacity  Lung cancer screening CT 07/30/2021 report reviewed by me, shows a 2.2 x 1.2 solid superior right lower lobe nodule that is increased in size from 7 mm on his previous scan from 01/29/2021.    Review of Systems As per HPI  Past Medical History:  Diagnosis Date   Acute renal insufficiency    Anginal pain (HCC)    Chronic lower back pain    "due to 3 ruptured discs in my back" (11/08/2018)   High cholesterol    History of kidney stones 1982   "passed them"   Hypertension    Myocardial infarction ([facility]) 24/2683   Systolic heart failure (HCC)    Tobacco use      Family History  Problem Relation Age of Onset   CAD Brother      Social History   Socioeconomic History   Marital status: Divorced    Spouse name: Not on file   Number of children: Not on file   Years of education: Not on file   Highest education level: Not on file  Occupational History   Not on file  Tobacco Use   Smoking status: Some Days    Packs/day: 0.14    Years: 47.00    Pack years: 6.58    Types: Cigarettes   Smokeless tobacco: Never   Tobacco comments:    3 cigarettes smoked daily  ARJ 08/20/21  Vaping Use   Vaping Use: Never used  Substance and Sexual Activity   Alcohol use: Not Currently    Comment: 11/08/2018 "stopped 09/23/1998"   Drug use: Not Currently    Comment: 11/08/2018 "nothing in the 2000s"   Sexual activity: Yes  Other Topics Concern   Not on file  Social History Narrative   Not on file   Social Determinants of Health   Financial Resource  Strain: Not on file  Food Insecurity: Not on file  Transportation Needs: Not on file  Physical Activity: Not on file  Stress: Not on file  Social Connections: Not on file  Intimate Partner Violence: Not on file    Was in the [employer], medical x 20 yrs Went to [place], [place], [place], [place], [place]   No Known Allergies   Outpatient Medications Prior to Visit  Medication Sig Dispense Refill   aspirin 81 MG EC tablet Take 1 tablet (81 mg total) by mouth daily. 30 tablet 6   atorvastatin (LIPITOR) 40 MG tablet TAKE 1 TABLET BY MOUTH ONCE DAILY . APPOINTMENT REQUIRED FOR FUTURE REFILLS BEFORE  MARCH  2022 15 tablet 0   carvedilol (COREG) 6.25 MG tablet Take 1 tablet (6.25 mg total) by mouth 2 (two) times daily with a meal. 180 tablet 3   Multiple Vitamin (MULTIVITAMIN WITH MINERALS) TABS tablet Take 1 tablet by mouth daily.     sacubitril-valsartan (ENTRESTO) 24-26 MG Take 1 tablet by mouth 2 (two) times daily. Please make overdue appt with Dr. Meda Coffee before anymore refills. 1st attempt 60 tablet 0   spironolactone (ALDACTONE) 25 MG  tablet Take 0.5 tablets (12.5 mg total) by mouth daily. 15 tablet 5   atorvastatin (LIPITOR) 40 MG tablet Take 1 tablet by mouth once daily (Patient not taking: Reported on 08/20/2021) 90 tablet 3   No facility-administered medications prior to visit.          Objective:   Physical Exam Vitals:   08/20/21 1515  BP: 124/68  Pulse: 66  Temp: 97.9 F (36.6 C)  TempSrc: Oral  SpO2: 99%  Weight: 152 lb (68.9 kg)  Height: 5\' 7"  (1.702 m)   Gen: Pleasant, thin gentleman, in no distress,  normal affect  ENT: No lesions,  mouth clear,  oropharynx clear, no postnasal drip  Neck: No JVD, no stridor  Lungs: No use of accessory muscles, no crackles or wheezing on normal respiration, no wheeze on forced expiration  Cardiovascular: RRR, heart sounds normal, no murmur or gallops, no peripheral edema  Musculoskeletal: No deformities, no cyanosis or  clubbing  Neuro: alert, awake, non focal  Skin: Warm, no lesions or rash     Assessment & Plan:   Pulmonary nodule Isolated pulmonary nodule in the right lower lobe, very suspicious for primary lung cancer based on interval increase in size over 6 months.  He is a smoker but does not have any respiratory limitation and may be a good candidate for primary resection.  He has a PET scan planned for 09/02/2021 which we will review.  I will also obtain pulmonary function testing.  If he is a good candidate then I will refer him for possible resection to thoracic surgery.  If he has marginal lung function then we will plan for navigational bronchoscopy to get a tissue diagnosis and hopefully facilitate SBRT.  We will also need to discuss with Dr. Rayann Heman and cardiology to ensure we understand his risk for general anesthesia in the event he does go for surgery or bronchoscopy.  Tobacco use He has already cut down and is interested in cessation.  We will support him in this   Baltazar Apo, MD, PhD 08/20/2021, 3:53 PM [facility] [phone] or if no answer before 7:00PM call [phone] For any issues after 7:00PM please call eLink [phone]

## 2021-08-20 NOTE — Patient Instructions (Signed)
Get your PET scan on 09/02/2021 as planned.   We will arrange for pulmonary function testing soon as possible Follow Dr. Lamonte Sakai after your PET scan so that we can review the results of all of your testing and decide next steps.

## 2021-08-20 NOTE — Assessment & Plan Note (Signed)
Isolated pulmonary nodule in the right lower lobe, very suspicious for primary lung cancer based on interval increase in size over 6 months.  He is a smoker but does not have any respiratory limitation and may be a good candidate for primary resection.  He has a PET scan planned for 09/02/2021 which we will review.  I will also obtain pulmonary function testing.  If he is a good candidate then I will refer him for possible resection to thoracic surgery.  If he has marginal lung function then we will plan for navigational bronchoscopy to get a tissue diagnosis and hopefully facilitate SBRT.  We will also need to discuss with Dr. Rayann Heman and cardiology to ensure we understand his risk for general anesthesia in the event he does go for surgery or bronchoscopy.

## 2021-08-21 ENCOUNTER — Ambulatory Visit
Admission: RE | Admit: 2021-08-21 | Discharge: 2021-08-21 | Disposition: A | Discharge status: Home / Self Care | Payer: Self-pay | Source: Ambulatory Visit | Attending: Radiation Oncology | Admitting: Radiation Oncology

## 2021-08-21 ENCOUNTER — Other Ambulatory Visit: Payer: Self-pay | Admitting: Radiation Oncology

## 2021-08-21 DIAGNOSIS — R911 Solitary pulmonary nodule: Secondary | ICD-10-CM

## 2021-08-25 ENCOUNTER — Ambulatory Visit (INDEPENDENT_AMBULATORY_CARE_PROVIDER_SITE_OTHER): Payer: No Typology Code available for payment source

## 2021-08-25 DIAGNOSIS — Z95810 Presence of automatic (implantable) cardiac defibrillator: Secondary | ICD-10-CM

## 2021-08-25 DIAGNOSIS — I5042 Chronic combined systolic (congestive) and diastolic (congestive) heart failure: Secondary | ICD-10-CM

## 2021-08-26 ENCOUNTER — Other Ambulatory Visit: Payer: Self-pay

## 2021-08-26 ENCOUNTER — Ambulatory Visit (INDEPENDENT_AMBULATORY_CARE_PROVIDER_SITE_OTHER): Payer: No Typology Code available for payment source | Admitting: Emergency Medicine

## 2021-08-26 DIAGNOSIS — Z720 Tobacco use: Secondary | ICD-10-CM

## 2021-08-26 DIAGNOSIS — R911 Solitary pulmonary nodule: Secondary | ICD-10-CM

## 2021-08-26 LAB — PULMONARY FUNCTION TEST
DL/VA % pred: 57 %
DL/VA: 2.39 ml/min/mmHg/L
DLCO cor % pred: 51 %
DLCO cor: 11.88 ml/min/mmHg
DLCO unc % pred: 51 %
DLCO unc: 11.88 ml/min/mmHg
FEF 25-75 Post: 0.72 L/sec
FEF 25-75 Pre: 1.17 L/sec
FEF2575-%Change-Post: -38 %
FEF2575-%Pred-Post: 34 %
FEF2575-%Pred-Pre: 55 %
FEV1-%Change-Post: -12 %
FEV1-%Pred-Post: 70 %
FEV1-%Pred-Pre: 79 %
FEV1-Post: 1.71 L
FEV1-Pre: 1.94 L
FEV1FVC-%Change-Post: -19 %
FEV1FVC-%Pred-Pre: 90 %
FEV6-%Change-Post: 9 %
FEV6-%Pred-Post: 97 %
FEV6-%Pred-Pre: 88 %
FEV6-Post: 3.01 L
FEV6-Pre: 2.74 L
FEV6FVC-%Change-Post: 0 %
FEV6FVC-%Pred-Post: 103 %
FEV6FVC-%Pred-Pre: 104 %
FVC-%Change-Post: 9 %
FVC-%Pred-Post: 94 %
FVC-%Pred-Pre: 86 %
FVC-Post: 3.08 L
FVC-Pre: 2.82 L
Post FEV1/FVC ratio: 55 %
Post FEV6/FVC ratio: 98 %
Pre FEV1/FVC ratio: 69 %
Pre FEV6/FVC Ratio: 99 %
RV % pred: 110 %
RV: 2.47 L
TLC % pred: 93 %
TLC: 5.82 L

## 2021-08-26 NOTE — Progress Notes (Signed)
Remote ICD transmission.   

## 2021-08-26 NOTE — Progress Notes (Signed)
PFT done today. 

## 2021-08-27 ENCOUNTER — Encounter: Payer: No Typology Code available for payment source | Admitting: Thoracic Surgery (Cardiothoracic Vascular Surgery)

## 2021-08-27 ENCOUNTER — Telehealth: Payer: Self-pay

## 2021-08-27 NOTE — Telephone Encounter (Signed)
Patient agreed to send missed ICM transmission today.

## 2021-08-29 ENCOUNTER — Telehealth: Payer: Self-pay

## 2021-08-29 NOTE — Telephone Encounter (Signed)
Remote ICM transmission received.  Attempted call to patient regarding ICM remote transmission and left detailed message per DPR.  Advised to return call for any fluid symptoms or questions. Next ICM remote transmission scheduled 09/29/2021.

## 2021-08-29 NOTE — Progress Notes (Signed)
EPIC Encounter for ICM Monitoring  Patient Name: Juan Norris is a 70 y.o. male Date: 08/29/2021 Primary Care Physican: [facility] Primary Cardiologist: Johney Frame Electrophysiologist: Allred Bi-V Pacing:   98.9%       06/18/2021 Weight: 153 lbs                                                          Attempted call to patient and unable to reach.  Left detailed message per DPR regarding transmission. Transmission reviewed.    Optivol Thoracic impedance suggesting normal fluid levels.    Prescribed: Spironolactone 25 mg take 0.5 tablet (12.5 mg total) daily   Labs: 02/12/2021 Creatinine 0.95, BUN 11, Potassium 4.4, Sodium 139, GFR 87 A complete set of results can be found in Results Review.   Recommendations:  Left voice mail with ICM number and encouraged to call if experiencing any fluid symptoms.   Follow-up plan: ICM clinic phone appointment on 09/29/2021.   91 day device clinic remote transmission 11/12/2021.      EP/Cardiology Office Visits: Recall 10/31/2021 with Dr Rayann Heman.  Recall with Dr Johney Frame 02/07/2022.   Copy of ICM check sent to Dr. Rayann Heman.     3 month ICM trend: 08/27/2021.    1 Year ICM trend:       Rosalene Billings, RN 08/29/2021 8:45 AM

## 2021-08-31 ENCOUNTER — Observation Stay (HOSPITAL_COMMUNITY)
Admission: EM | Admit: 2021-08-31 | Discharge: 2021-09-01 | Disposition: A | Discharge status: Home / Self Care | Payer: No Typology Code available for payment source | Attending: Student | Admitting: Student

## 2021-08-31 ENCOUNTER — Emergency Department (HOSPITAL_COMMUNITY): Payer: No Typology Code available for payment source

## 2021-08-31 ENCOUNTER — Encounter (HOSPITAL_COMMUNITY): Payer: Self-pay | Admitting: Emergency Medicine

## 2021-08-31 ENCOUNTER — Other Ambulatory Visit: Payer: Self-pay

## 2021-08-31 DIAGNOSIS — Z20822 Contact with and (suspected) exposure to covid-19: Secondary | ICD-10-CM | POA: Diagnosis not present

## 2021-08-31 DIAGNOSIS — I110 Hypertensive heart disease with heart failure: Secondary | ICD-10-CM | POA: Diagnosis not present

## 2021-08-31 DIAGNOSIS — I5042 Chronic combined systolic (congestive) and diastolic (congestive) heart failure: Secondary | ICD-10-CM | POA: Diagnosis not present

## 2021-08-31 DIAGNOSIS — R911 Solitary pulmonary nodule: Secondary | ICD-10-CM | POA: Diagnosis present

## 2021-08-31 DIAGNOSIS — M79605 Pain in left leg: Secondary | ICD-10-CM | POA: Diagnosis not present

## 2021-08-31 DIAGNOSIS — Z79899 Other long term (current) drug therapy: Secondary | ICD-10-CM | POA: Diagnosis not present

## 2021-08-31 DIAGNOSIS — Z720 Tobacco use: Secondary | ICD-10-CM | POA: Diagnosis present

## 2021-08-31 DIAGNOSIS — I509 Heart failure, unspecified: Secondary | ICD-10-CM

## 2021-08-31 DIAGNOSIS — Z7982 Long term (current) use of aspirin: Secondary | ICD-10-CM | POA: Diagnosis not present

## 2021-08-31 DIAGNOSIS — R202 Paresthesia of skin: Secondary | ICD-10-CM | POA: Diagnosis not present

## 2021-08-31 DIAGNOSIS — I2510 Atherosclerotic heart disease of native coronary artery without angina pectoris: Secondary | ICD-10-CM | POA: Insufficient documentation

## 2021-08-31 DIAGNOSIS — F17210 Nicotine dependence, cigarettes, uncomplicated: Secondary | ICD-10-CM | POA: Insufficient documentation

## 2021-08-31 DIAGNOSIS — D649 Anemia, unspecified: Secondary | ICD-10-CM | POA: Diagnosis not present

## 2021-08-31 DIAGNOSIS — R29898 Other symptoms and signs involving the musculoskeletal system: Secondary | ICD-10-CM | POA: Diagnosis not present

## 2021-08-31 DIAGNOSIS — R200 Anesthesia of skin: Principal | ICD-10-CM

## 2021-08-31 DIAGNOSIS — I10 Essential (primary) hypertension: Secondary | ICD-10-CM

## 2021-08-31 LAB — CBC
HCT: 40.4 % (ref 39.0–52.0)
Hemoglobin: 13.7 g/dL (ref 13.0–17.0)
MCH: 31.1 pg (ref 26.0–34.0)
MCHC: 33.9 g/dL (ref 30.0–36.0)
MCV: 91.6 fL (ref 80.0–100.0)
Platelets: 210 10*3/uL (ref 150–400)
RBC: 4.41 MIL/uL (ref 4.22–5.81)
RDW: 12.8 % (ref 11.5–15.5)
WBC: 11 10*3/uL — ABNORMAL HIGH (ref 4.0–10.5)
nRBC: 0 % (ref 0.0–0.2)

## 2021-08-31 LAB — COMPREHENSIVE METABOLIC PANEL
ALT: 21 U/L (ref 0–44)
AST: 25 U/L (ref 15–41)
Albumin: 3.7 g/dL (ref 3.5–5.0)
Alkaline Phosphatase: 81 U/L (ref 38–126)
Anion gap: 8 (ref 5–15)
BUN: 11 mg/dL (ref 8–23)
CO2: 25 mmol/L (ref 22–32)
Calcium: 9.3 mg/dL (ref 8.9–10.3)
Chloride: 107 mmol/L (ref 98–111)
Creatinine, Ser: 1.09 mg/dL (ref 0.61–1.24)
GFR, Estimated: >60 mL/min (ref >60)
Glucose, Bld: 159 mg/dL — ABNORMAL HIGH (ref 70–99)
Potassium: 4.6 mmol/L (ref 3.5–5.1)
Sodium: 140 mmol/L (ref 135–145)
Total Bilirubin: 0.7 mg/dL (ref 0.3–1.2)
Total Protein: 6.9 g/dL (ref 6.5–8.1)

## 2021-08-31 LAB — PROTIME-INR
INR: 1.1 (ref 0.8–1.2)
Prothrombin Time: 14.4 seconds (ref 11.4–15.2)

## 2021-08-31 LAB — CBG MONITORING, ED: Glucose-Capillary: 157 mg/dL — ABNORMAL HIGH (ref 70–99)

## 2021-08-31 LAB — I-STAT CHEM 8, ED
BUN: 13 mg/dL (ref 8–23)
Calcium, Ion: 1.09 mmol/L — ABNORMAL LOW (ref 1.15–1.40)
Chloride: 110 mmol/L (ref 98–111)
Creatinine, Ser: 1.1 mg/dL (ref 0.61–1.24)
Glucose, Bld: 153 mg/dL — ABNORMAL HIGH (ref 70–99)
HCT: 41 % (ref 39.0–52.0)
Hemoglobin: 13.9 g/dL (ref 13.0–17.0)
Potassium: 4.5 mmol/L (ref 3.5–5.1)
Sodium: 142 mmol/L (ref 135–145)
TCO2: 23 mmol/L (ref 22–32)

## 2021-08-31 LAB — DIFFERENTIAL
Abs Immature Granulocytes: 0.03 10*3/uL (ref 0.00–0.07)
Basophils Absolute: 0 10*3/uL (ref 0.0–0.1)
Basophils Relative: 0 %
Eosinophils Absolute: 0.2 10*3/uL (ref 0.0–0.5)
Eosinophils Relative: 2 %
Immature Granulocytes: 0 %
Lymphocytes Relative: 19 %
Lymphs Abs: 2 10*3/uL (ref 0.7–4.0)
Monocytes Absolute: 0.8 10*3/uL (ref 0.1–1.0)
Monocytes Relative: 7 %
Neutro Abs: 8 10*3/uL — ABNORMAL HIGH (ref 1.7–7.7)
Neutrophils Relative %: 72 %

## 2021-08-31 LAB — APTT: aPTT: 31 seconds (ref 24–36)

## 2021-08-31 IMAGING — CT CT HEAD CODE STROKE
3 series · 15 of 47 positions shown, 18 images · non-contrast
Comparison: None.

CLINICAL DATA: Code stroke. Neuro deficit, acute, stroke suspected.
Left leg weakness.

EXAM:
CT HEAD WITHOUT CONTRAST
TECHNIQUE: Contiguous axial images were obtained from the base of the skull
through the vertex without intravenous contrast.

[Series 4: head 5.0 h30s · axial · 0.45mm/px · z∈[-100,+35]mm · 9 of 33 slices shown, 12 images]
[im 3/33  brain]
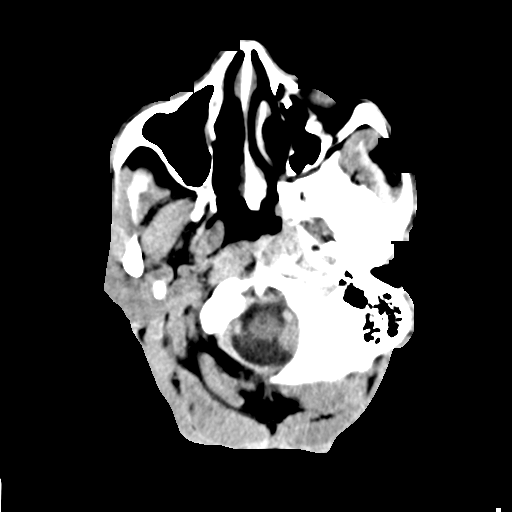
[im 3/33  bone]
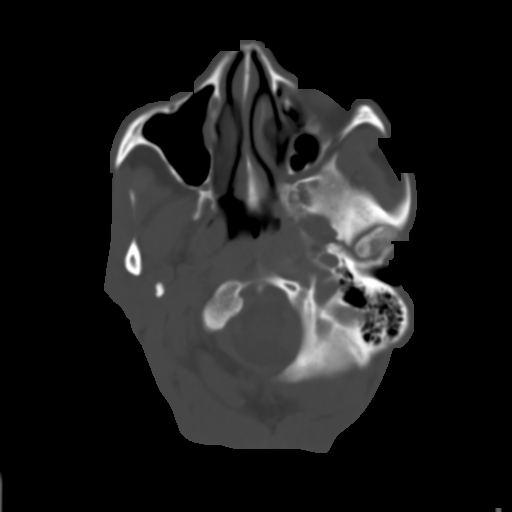
[im 6/33  brain]
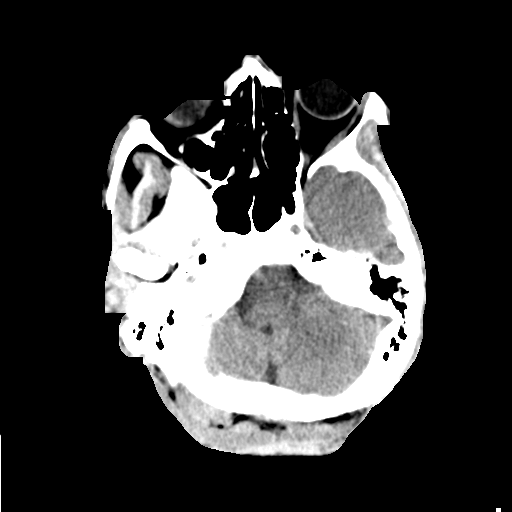
[im 9/33  brain]
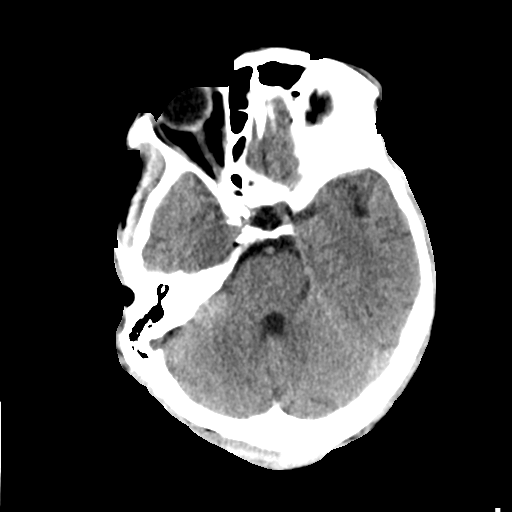
[im 13/33  brain]
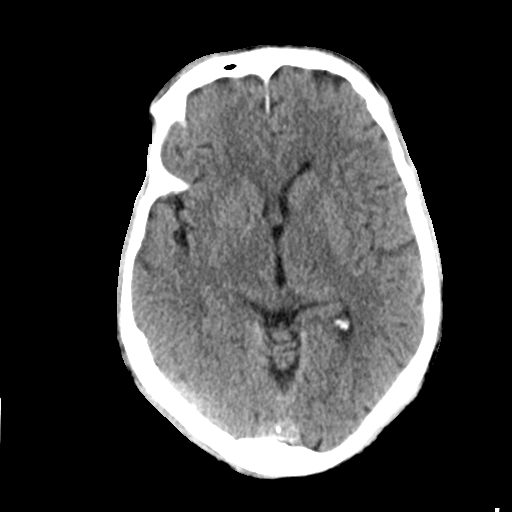
[im 17/33  brain]
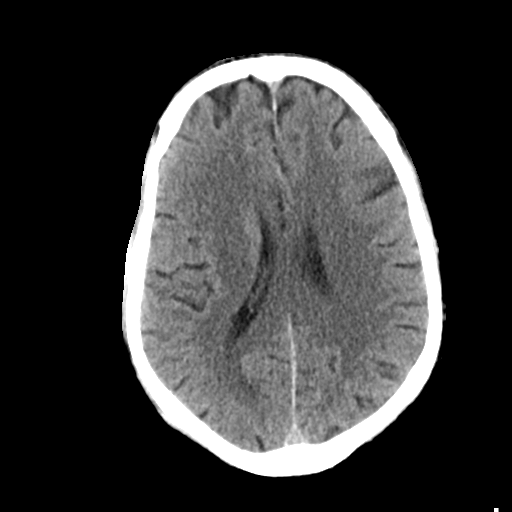
[im 17/33  bone]
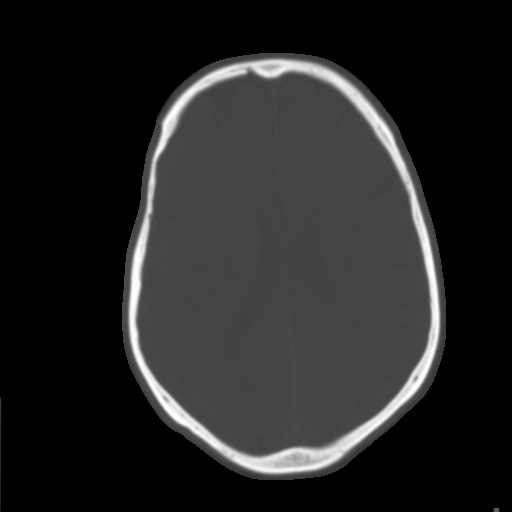
[im 20/33  brain]
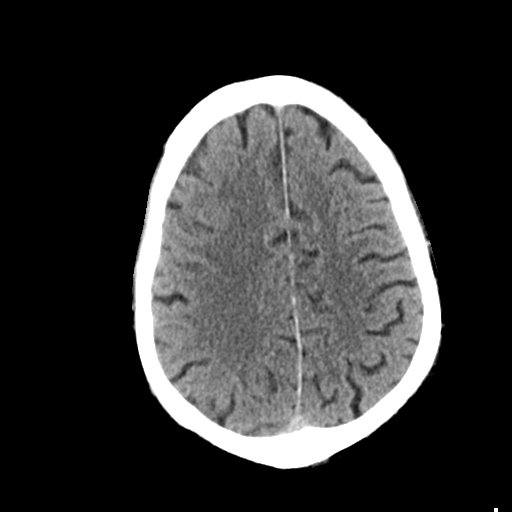
[im 24/33  brain]
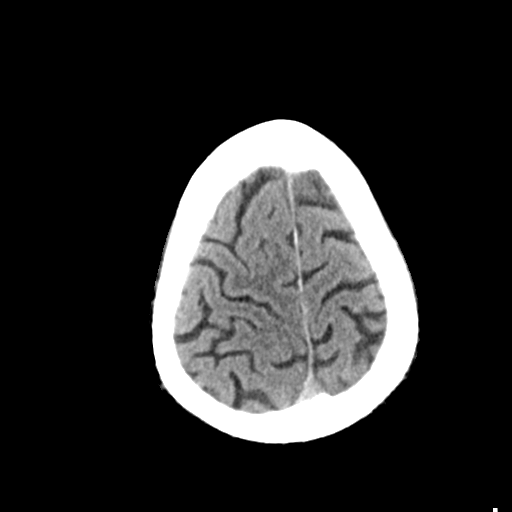
[im 27/33  brain]
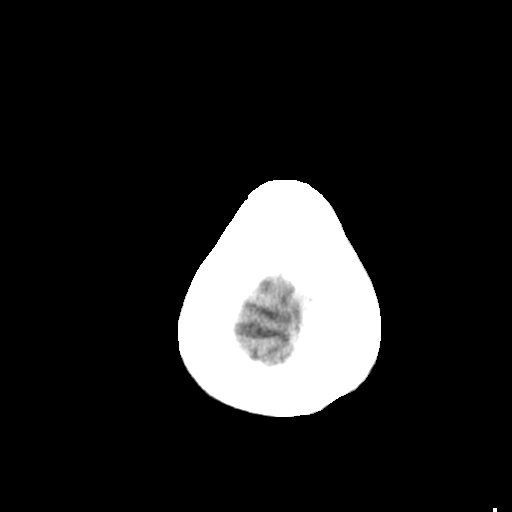
[im 30/33  brain]
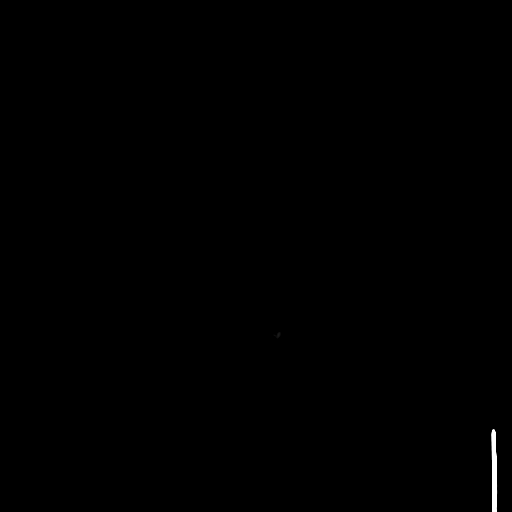
[im 30/33  bone]
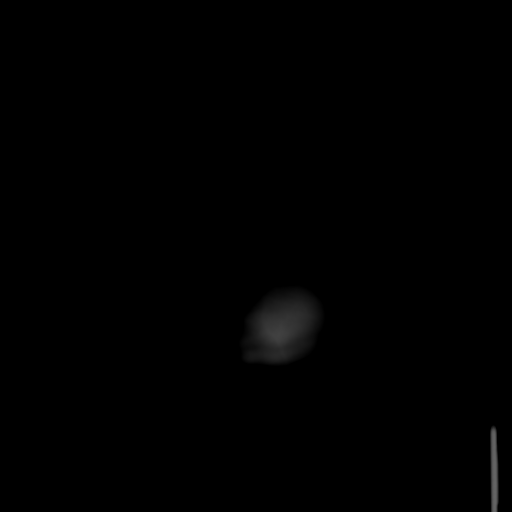

[Series 5: head 3.0 mpr cor · coronal · 0.32mm/px · 3 of 68 slices shown]
[im 23/68  brain]
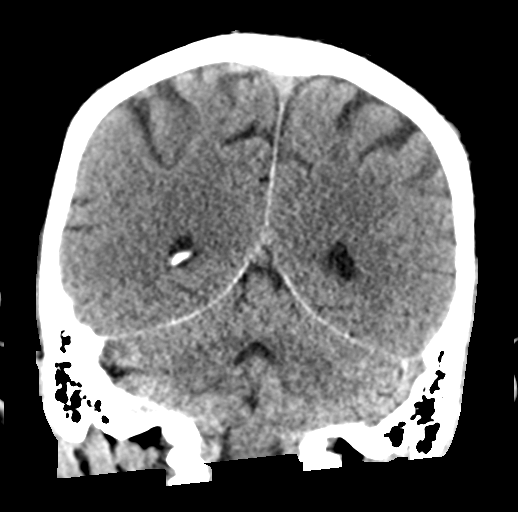
[im 30/68  brain]
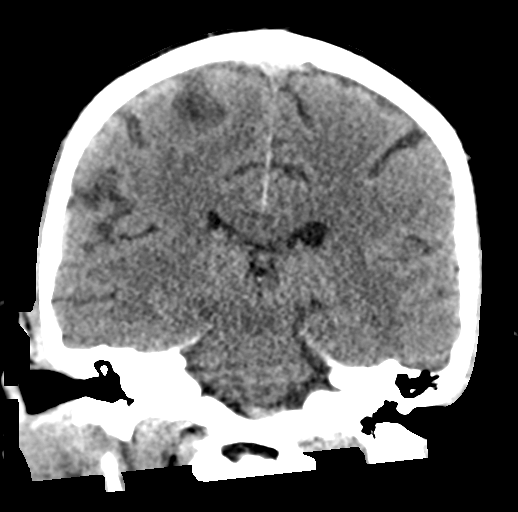
[im 38/68  brain]
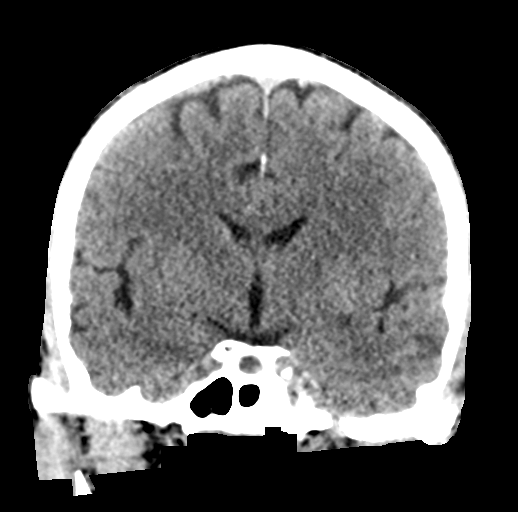

[Series 6: head 3.0 mpr sag · sagittal · 0.30mm/px · 3 of 57 slices shown]
[im 23/57  brain]
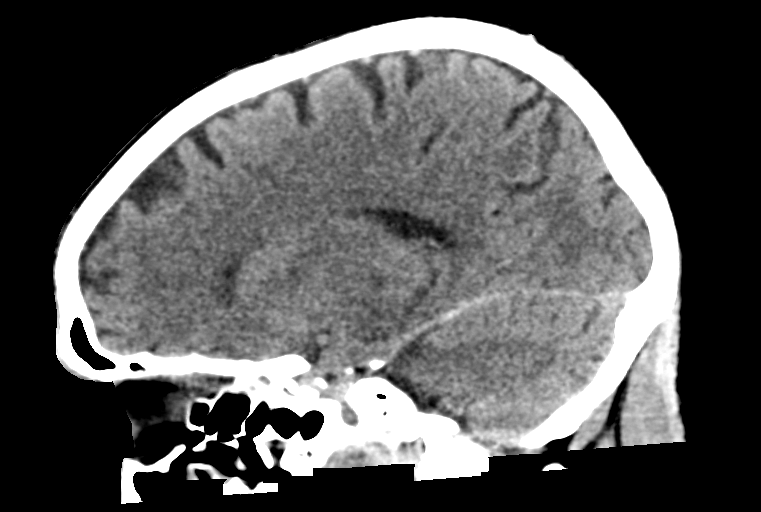
[im 29/57  brain]
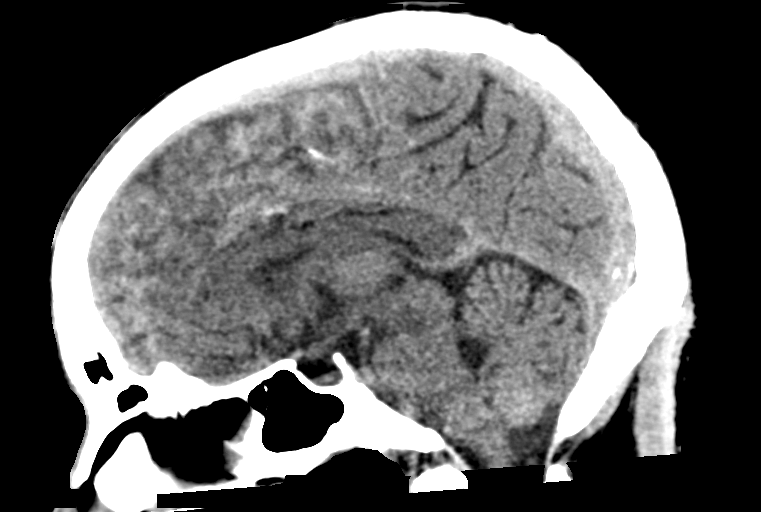
[im 34/57  brain]
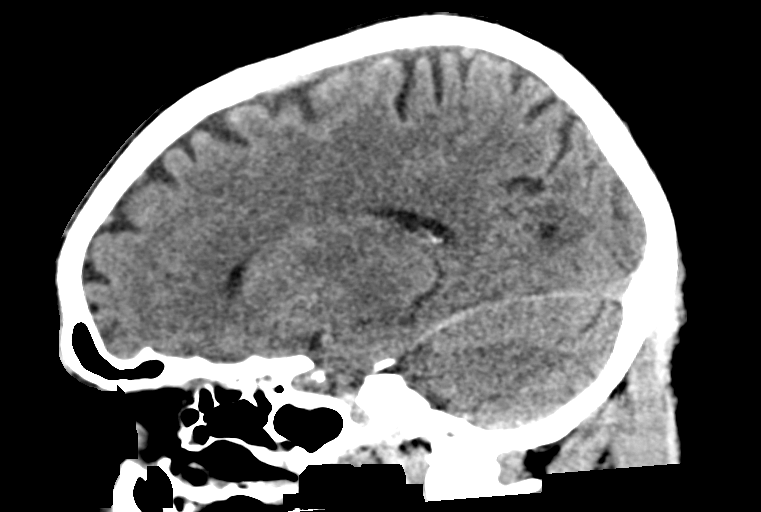

[15 of 47 positions shown; findings below may reference images not displayed]

FINDINGS: Brain: There is no evidence of an acute infarct, intracranial
hemorrhage, mass, midline shift, or extra-axial fluid collection.
The ventricles and sulci are normal.

Vascular: Calcified atherosclerosis at the skull base. No hyperdense
vessel.

Skull: No fracture suspicious osseous lesion.

Sinuses/Orbits: Mild mucosal thickening in the paranasal sinuses.
Clear mastoid air cells. Unremarkable orbits.

Other: None.

ASPECTS ([place] Stroke Program Early CT Score)

- Ganglionic level infarction (caudate, lentiform nuclei, internal
capsule, insula, M1-M3 cortex): 7

- Supraganglionic infarction (M4-M6 cortex): 3

Total score (0-10 with 10 being normal): 10
IMPRESSION: Unremarkable CT appearance of the brain. ASPECTS of 10.

These results were communicated to Dr. MYKA at [DATE] on
[DATE] by text page via the AMION messaging system.

## 2021-08-31 MED ORDER — ATORVASTATIN CALCIUM 40 MG PO TABS
40.0000 mg | ORAL_TABLET | Freq: Every day | ORAL | Status: DC
  Administered 2021-08-31 – 2021-09-01 (×2): 40 mg via ORAL
  Filled 2021-08-31 (×2): qty 1

## 2021-08-31 MED ORDER — SODIUM CHLORIDE 0.9% FLUSH
3.0000 mL | Freq: Once | INTRAVENOUS | Status: AC
  Administered 2021-08-31: 3 mL via INTRAVENOUS

## 2021-08-31 MED ORDER — ACETAMINOPHEN 325 MG PO TABS: 650.0000 mg | ORAL_TABLET | Freq: Four times a day (QID) | ORAL | Status: DC | PRN

## 2021-08-31 MED ORDER — ACETAMINOPHEN 650 MG RE SUPP: 650.0000 mg | Freq: Four times a day (QID) | RECTAL | Status: DC | PRN

## 2021-08-31 MED ORDER — NICOTINE 7 MG/24HR TD PT24
7.0000 mg | MEDICATED_PATCH | Freq: Every day | TRANSDERMAL | Status: DC
  Administered 2021-09-01: 7 mg via TRANSDERMAL
  Filled 2021-08-31 (×2): qty 1

## 2021-08-31 NOTE — ED Triage Notes (Addendum)
Pt states he was sitting in church around 11:20am and started having left lower leg numbness and leg was warm to touch.  States he started having cramping to L posterior knee on the way to the hospital.  Reports numbness to top of L foot at this time.  Denies arm numbness/weakness.  No leg drift.  Decreased sensation in L lower leg.  VAN negative.  Pt does have difficulty lifting leg up to ambulate.  Dr. Kathrynn Humble notified of pt.

## 2021-08-31 NOTE — ED Notes (Signed)
Pt care taken, daughter at bedside, went to the restroom

## 2021-08-31 NOTE — H&P (Signed)
History and Physical    Juan Norris KZL:[id number] DOB: [birth year] DOA: 08/31/2021  [facility]P: [facility] Patient coming from: Home  Chief Complaint: Left leg numbness  HPI: Juan Norris is a 70 y.o. 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 Lamonte Sakai from pulmonology).  He presents to the ED today as code stroke for evaluation of left leg numbness which started at 11:20 AM this morning.  The patient had sudden onset of shooting pain in this leg followed by numbness of the entire leg with inability to ambulate.  Head CT negative for acute finding.  CBC showing borderline leukocytosis (WBC 11.0), otherwise normal.  CMP unremarkable.  Coags normal.  Neurology recommended MRI of brain and lumbar spine which cannot be done done until tomorrow morning as patient has an ICD and the representative has to come and coordinate for MRI.  Neurology recommending further stroke work-up only if brain MRI is positive for stroke.  Patient states this morning around 11:20 AM he was sitting at church when he experienced sudden onset left lower leg pain.  Soon after his leg became numb.  He was able to stand on this leg but was not able to walk.  No weakness or numbness in his upper extremities or contralateral leg.  Denies history of prior stroke or TIA.  States he had surgery done on his left knee in the 1990s to remove a mass from behind his knee.  Also states he was recently diagnosed with "lung cancer."  He has been smoking cigarettes for the past 50 years and trying to cut down now.  He is vaccinated against COVID.  Denies fevers, cough, shortness of breath, or chest pain.  No other complaints.  Review of Systems:  All systems reviewed and apart from history  of presenting illness, are negative.  Past Medical History:  Diagnosis Date   Acute renal insufficiency    Anginal pain (HCC)    Chronic lower back pain    "due to 3 ruptured discs in my back" (11/08/2018)   High cholesterol    History of kidney stones 1982   "passed them"   Hypertension    Myocardial infarction ([facility]) 39/0300   Systolic heart failure ([place])    Tobacco use     Past Surgical History:  Procedure Laterality Date   BIV ICD INSERTION CRT-D N/A 11/08/2018    [employer] biventricular ICD implantation by Dr Rayann Heman with His Bundle pacing and a MDT 9233 lead placed into the LV port.    CARDIAC CATHETERIZATION     INGUINAL HERNIA REPAIR Bilateral    KNEE SURGERY Left 1990s   "growth on the back of my knee removed"   LEFT HEART CATH AND CORONARY ANGIOGRAPHY N/A 03/30/2018   Procedure: LEFT HEART CATH AND CORONARY ANGIOGRAPHY;  Surgeon: Burnell Blanks, MD;  Location: [facility];  Service: Cardiovascular;  Laterality: N/A;     reports that he has been smoking cigarettes. He has a 6.58 pack-year smoking history. He has never used smokeless tobacco. He reports that he does not currently use alcohol. He reports that he does not currently use drugs.  No Known Allergies  Family History  Problem Relation Age of Onset   CAD Brother     Prior to Admission  medications   Medication Sig Start Date End Date Taking? Authorizing Provider  aspirin 81 MG EC tablet Take 1 tablet (81 mg total) by mouth daily. 11/09/18  Yes Baldwin Jamaica, PA-C  atorvastatin (LIPITOR) 40 MG tablet TAKE 1 TABLET BY MOUTH ONCE DAILY . APPOINTMENT REQUIRED FOR FUTURE REFILLS BEFORE  MARCH  2022 Patient taking differently: Take 40 mg by mouth daily. 02/24/21  Yes Dorothy Spark, MD  carvedilol (COREG) 6.25 MG tablet Take 1 tablet (6.25 mg total) by mouth 2 (two) times daily with a meal. Patient taking differently: Take 6.25 mg by mouth daily. 10/31/20  Yes Seiler, Luetta Nutting K, NP  Multiple  Vitamin (MULTIVITAMIN WITH MINERALS) TABS tablet Take 1 tablet by mouth daily.   Yes [provider]  sacubitril-valsartan (ENTRESTO) 24-26 MG Take 1 tablet by mouth 2 (two) times daily. Please make overdue appt with Dr. Meda Coffee before anymore refills. 1st attempt 08/29/20  Yes Dorothy Spark, MD  spironolactone (ALDACTONE) 25 MG tablet Take 0.5 tablets (12.5 mg total) by mouth daily. 03/11/20  Yes Dorothy Spark, MD  atorvastatin (LIPITOR) 40 MG tablet Take 1 tablet by mouth once daily Patient not taking: No sig reported 02/24/21   Dorothy Spark, MD    Physical Exam: Vitals:   08/31/21 1645 08/31/21 1715 08/31/21 1730 08/31/21 1800  BP: (!) 129/48 (!) 139/53 (!) 147/52 (!) 157/53  Pulse: 60 (!) 59 62 (!) 58  Resp: 16 14 (!) 23 15  Temp:      TempSrc:      SpO2: 96% 99% 99% 97%  Weight:      Height:        Physical Exam Constitutional:      General: He is not in acute distress. HENT:     Head: Normocephalic and atraumatic.  Eyes:     Extraocular Movements: Extraocular movements intact.     Conjunctiva/sclera: Conjunctivae normal.  Cardiovascular:     Rate and Rhythm: Normal rate and regular rhythm.     Pulses: Normal pulses.  Pulmonary:     Effort: Pulmonary effort is normal. No respiratory distress.     Breath sounds: Normal breath sounds. No wheezing or rales.  Abdominal:     General: Bowel sounds are normal. There is no distension.     Palpations: Abdomen is soft.     Tenderness: There is no abdominal tenderness.  Musculoskeletal:        General: No swelling or tenderness.     Cervical back: Normal range of motion and neck supple.  Skin:    General: Skin is warm and dry.  Neurological:     Mental Status: He is alert and oriented to person, place, and time.     Cranial Nerves: No cranial nerve deficit.     Sensory: Sensory deficit present.     Motor: Weakness present.     Comments: Strength 4 out of 5 in the left lower extremity and sensation to  light touch slightly diminished.  Strength and sensation to light touch intact in other extremities.     Labs on Admission: I have personally reviewed following labs and imaging studies  CBC: Recent Labs  Lab 08/31/21 1245 08/31/21 1251  WBC 11.0*  --   NEUTROABS 8.0*  --   HGB 13.7 13.9  HCT 40.4 41.0  MCV 91.6  --   PLT 210  --    Basic Metabolic Panel: Recent Labs  Lab 08/31/21 1245 08/31/21 1251  NA 140 142  K 4.6 4.5  CL 107 110  CO2 25  --   GLUCOSE 159* 153*  BUN 11 13  CREATININE 1.09 1.10  CALCIUM 9.3  --    GFR: Estimated Creatinine Clearance: 58.4 mL/min (by C-G formula based on SCr of 1.1 mg/dL). Liver Function Tests: Recent Labs  Lab 08/31/21 1245  AST 25  ALT 21  ALKPHOS 81  BILITOT 0.7  PROT 6.9  ALBUMIN 3.7   No results for input(s): LIPASE, AMYLASE in the last 168 hours. No results for input(s): AMMONIA in the last 168 hours. Coagulation Profile: Recent Labs  Lab 08/31/21 1245  INR 1.1   Cardiac Enzymes: No results for input(s): CKTOTAL, CKMB, CKMBINDEX, TROPONINI in the last 168 hours. BNP (last 3 results) No results for input(s): PROBNP in the last 8760 hours. HbA1C: No results for input(s): HGBA1C in the last 72 hours. CBG: Recent Labs  Lab 08/31/21 1244  GLUCAP 157*   Lipid Profile: No results for input(s): CHOL, HDL, LDLCALC, TRIG, CHOLHDL, LDLDIRECT in the last 72 hours. Thyroid Function Tests: No results for input(s): TSH, T4TOTAL, FREET4, T3FREE, THYROIDAB in the last 72 hours. Anemia Panel: No results for input(s): VITAMINB12, FOLATE, FERRITIN, TIBC, IRON, RETICCTPCT in the last 72 hours. Urine analysis:    Component Value Date/Time   COLORURINE YELLOW 05/07/2017 2036   APPEARANCEUR CLEAR 05/07/2017 2036   LABSPEC 1.013 05/07/2017 2036   PHURINE 6.0 05/07/2017 2036   GLUCOSEU NEGATIVE 05/07/2017 2036   HGBUR SMALL (A) 05/07/2017 2036   BILIRUBINUR NEGATIVE 05/07/2017 2036   [place] NEGATIVE 05/07/2017 2036    PROTEINUR 30 (A) 05/07/2017 2036   UROBILINOGEN 1.0 01/03/2014 2316   NITRITE NEGATIVE 05/07/2017 2036   LEUKOCYTESUR NEGATIVE 05/07/2017 2036    Radiological Exams on Admission: CT HEAD CODE STROKE WO CONTRAST  Result Date: 08/31/2021 CLINICAL DATA:  Code stroke. Neuro deficit, acute, stroke suspected. Left leg weakness. EXAM: CT HEAD WITHOUT CONTRAST TECHNIQUE: Contiguous axial images were obtained from the base of the skull through the vertex without intravenous contrast. COMPARISON:  None. FINDINGS: Brain: There is no evidence of an acute infarct, intracranial hemorrhage, mass, midline shift, or extra-axial fluid collection. The ventricles and sulci are normal. Vascular: Calcified atherosclerosis at the skull base. No hyperdense vessel. Skull: No fracture suspicious osseous lesion. Sinuses/Orbits: Mild mucosal thickening in the paranasal sinuses. Clear mastoid air cells. Unremarkable orbits. Other: None. ASPECTS [facility] Stroke Program Early CT Score) - Ganglionic level infarction (caudate, lentiform nuclei, internal capsule, insula, M1-M3 cortex): 7 - Supraganglionic infarction (M4-M6 cortex): 3 Total score (0-10 with 10 being normal): 10 IMPRESSION: Unremarkable CT appearance of the brain. ASPECTS of 10. These results were communicated to Dr. Theda Sers at 1:02 pm on 08/31/2021 by text page via the [name] messaging system. Electronically Signed   By: Logan Bores M.D.   On: 08/31/2021 13:03    EKG: Independently reviewed.  Interpretation limited secondary to paced rhythm.  Assessment/Plan Principal Problem:   Left leg numbness Active Problems:   Tobacco use   Pulmonary nodule   CHF (congestive heart failure) (HCC)   HTN (hypertension)   Acute onset left lower extremity weakness/numbness Symptom onset 11:20 AM today.  Head CT negative for acute finding. Neurology recommended MRI of brain and lumbar spine which cannot be done done until tomorrow morning as patient has an ICD and the  representative has to come and coordinate for MRI.  Neurology recommending further stroke work-up only if brain MRI is positive for stroke.  Fall precautions.  PT/OT eval.  Recently discovered right lower lobe pulmonary nodule Suspicious for primary lung cancer, followed by Dr. Lamonte Sakai from pulmonology and patient is scheduled for PET scan on 9/20.  Ensure outpatient pulmonology follow-up.  Hypertension Allow permissive hypertension until brain MRI is done.  Hyperlipidemia Continue statin  CAD Stable.  Not endorsing any anginal symptoms.  Hold Coreg at this time to allow permissive hypertension until brain MRI rules out stroke.  Hold aspirin until lumbar MRI is done.  Chronic combined CHF (EF 20-25%) status post BiV ICD Appears euvolemic at this time.  Hold Coreg, Entresto, and Aldactone at this time to allow permissive hypertension until brain MRI rules out stroke.  Tobacco use -NicoDerm patch and counseled to quit  DVT prophylaxis: SCDs at this time until MRI is done Code Status: Full code-discussed with the patient. Family Communication: Daughter at bedside. Disposition Plan: Status is: Observation  The patient remains OBS appropriate and will d/c before 2 midnights.  Dispo: The patient is from: Home              Anticipated d/c is to: Home              Patient currently is not medically stable to d/c.   Difficult to place patient No  Level of care: Level of care: Telemetry Medical  The medical decision making on this patient was of high complexity and the patient is at high risk for clinical deterioration, therefore this is a level 3 visit.  Shela Leff MD [facility]  If 7PM-7AM, please contact night-coverage [email or web address]  08/31/2021, 8:01 PM

## 2021-08-31 NOTE — Consult Note (Addendum)
Neurology consult   CC: code stroke.  History is obtained from: patient, triage RN.   HPI: Mr. Juan Norris is a 70 yo male with a PMHx of lung nodule, now suspected to be cancer, chronic LBP, HLD, HTN, CAD s/p MI and PCI, insertion of biventricular ICD in 2019, tobacco abuse, and CsHF who presented to ED today from church with c/o acute onset of LLE pain with numbness and tingling which occurred at the end of church service. States he was his normal self until church service ended. He told someone he couldn't feel his left leg and he had pain behind his left knee. He tried to ambulate and stated his foot didn't feel like it was on the floor. Patient was brought to ED triage and code stroke was called for LLE weakness.   Patient was brought by triage RN to CT suite. Patient was able to stand with assistance with noted difficulty supporting himself with the LLE. Pain with movement of LLE described as burning behind the knee.   CTH without noted stroke or bleed. Patient was taken back to the ED for further workup as we felt his presentation was likely not consistent with stroke, but peroneal nerve issue or Lspine issue.   Patient has been followed by [facility] pulmonology for known lung nodule. OV 08/20/21 reviewed in Epic. Dr. Lamonte Sakai stated "Isolated pulmonary nodule in the right lower lobe, very suspicious for primary lung cancer based on interval increase in size over 6 months.  He is a smoker but does not have any respiratory limitation and may be a good candidate for primary resection.  He has a PET scan planned for 09/02/2021 which we will review.  I will also obtain pulmonary function testing.  If he is a good candidate then I will refer him for possible resection to thoracic surgery.  If he has marginal lung function then we will plan for navigational bronchoscopy to get a tissue diagnosis and hopefully facilitate SBRT.  We will also need to discuss with Dr. Rayann Heman and cardiology to ensure we understand  his risk for general anesthesia in the event he does go for surgery or bronchoscopy". Patient is scheduled for PET scan next week.  LKW: 1000  hours. TNK given?: No, NIH 0 and low suspicion for stroke.  IR Thrombectomy?: No.  MRS: 1  NIHSS: 0  ROS: A robust ROS was unable to be performed due to emergent nature of event. ROS as per HPI.   Past Medical History:  Diagnosis Date   Acute renal insufficiency    Anginal pain (HCC)    Chronic lower back pain    "due to 3 ruptured discs in my back" (11/08/2018)   High cholesterol    History of kidney stones 1982   "passed them"   Hypertension    Myocardial infarction ([place]) 59/5638   Systolic heart failure ([place])    Tobacco use   New lung cancer 2022.   Family History  Problem Relation Age of Onset   CAD Brother     Social History:  reports that he has been smoking cigarettes. He has a 6.58 pack-year smoking history. He has never used smokeless tobacco. He reports that he does not currently use alcohol. He reports that he does not currently use drugs.   Prior to Admission medications   Medication Sig Start Date End Date Taking? Authorizing Provider  aspirin 81 MG EC tablet Take 1 tablet (81 mg total) by mouth daily. 11/09/18  Yes  Baldwin Jamaica, PA-C  atorvastatin (LIPITOR) 40 MG tablet TAKE 1 TABLET BY MOUTH ONCE DAILY . APPOINTMENT REQUIRED FOR FUTURE REFILLS BEFORE  MARCH  2022 Patient taking differently: Take 40 mg by mouth daily. 02/24/21  Yes Dorothy Spark, MD  carvedilol (COREG) 6.25 MG tablet Take 1 tablet (6.25 mg total) by mouth 2 (two) times daily with a meal. Patient taking differently: Take 6.25 mg by mouth daily. 10/31/20  Yes Seiler, Luetta Nutting K, NP  Multiple Vitamin (MULTIVITAMIN WITH MINERALS) TABS tablet Take 1 tablet by mouth daily.   Yes [provider]  sacubitril-valsartan (ENTRESTO) 24-26 MG Take 1 tablet by mouth 2 (two) times daily. Please make overdue appt with Dr. Meda Coffee before anymore refills.  1st attempt 08/29/20  Yes Dorothy Spark, MD  spironolactone (ALDACTONE) 25 MG tablet Take 0.5 tablets (12.5 mg total) by mouth daily. 03/11/20  Yes Dorothy Spark, MD  atorvastatin (LIPITOR) 40 MG tablet Take 1 tablet by mouth once daily Patient not taking: No sig reported 02/24/21   Dorothy Spark, MD    Exam: Current vital signs: BP (!) 141/49   Pulse 60   Temp 98.4 F (36.9 C) (Oral)   Resp 13   Ht 5\' 7"  (1.702 m)   Wt 70 kg   SpO2 99%   BMI 24.17 kg/m   Physical Exam  Constitutional: Appears well-developed and well-nourished.  Psych: Affect-he is tearful.  Eyes: No scleral injection. HENT: No OP obstruction. Head: Normocephalic.  Cardiovascular: Normal rate and regular rhythm.  Respiratory: Effort normal.  GI: Abdomen soft.  No distension. There is no tenderness.  Skin: WDI.   Neuro: Mental Status: Patient is awake, alert, oriented to person, place, month, year, and situation. Patient is able to give a clear and coherent history. No signs of neglect. Speech/Language:  Speech is clear, fluent without dysarthria or aphasia. Repetition, naming, and comprehension intact.  Cranial Nerves: II: Visual Fields are full. Pupils are equal, round, and reactive to light.  III,IV, VI: EOMI without ptosis or diploplia.  V: Facial sensation is symmetric to light touch in V1, V2, and V3. VII: Facial movement symmetrical.  VIII: hearing is intact to voice. X: Uvula elevates symmetrically. XI: Shoulder shrug is symmetric. XII: tongue is midline without atrophy or fasciculations.  Motor: 5/5 strength to all muscle groups tested except LLE which is limited by pain.  Sensory: Sensation is decreased in the LLE. Able to sense cold to LLE, symmetric with RLE. Extinction absent DSS.  Plantars: Toes are downgoing bilaterally.  Cerebellar: No ataxia noted with FNF. Unable to perform HKS due to pain.    I have reviewed labs in epic and the pertinent results are: INR  1.1       aPTT   31    creatinine 1.10    Glucose 153   MD reviewed the images obtained: NCT head  Unremarkable CT appearance of the brain. ASPECTS of 10.  Assessment: 70 yo male with stroke risk factors of CAD, HTN, tobacco abuse, and HLD. Patient presented with LLE pain at posterior knee with sensation issues with gait disturbance. CTH neg. Further imaging not performed in code stroke, due to low suspicion for stroke and/or LVO. No TNK due to low suspicion of stroke and NIH of 0. He will have MRI brain given his new likely diagnosis of lung cancer. Believe his symptoms are more likely to be a lumbar spine issue and ED is working that up.    Impression:  -  LLE weakness with sensation changes and pain with gait disturbance, doubt stroke or TIA.  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    Plan: - If MRI brain is + for stroke, he will need complete stroke workup with medicine admission. If MRI brain is negative for acute finding, he requires no further stroke workup.   If MRI brain is + for stroke, will need: - vascular imaging with MRA head and neck. - TTE. - HbA1c, lipid panel, TSH. - Recommend Statin or increased dose if LDL > 70 - Aspirin 81mg  daily. - Clopidogrel 75mg  daily for 3 weeks. - SBP goal - Permissive hypertension first 24 h < 220/110. Hold home medications for now. - Telemetry monitoring for arrhythmia. - bedside Swallow screen. - Stroke education. - PT/OT/SLP consult. - NIHSS as per protocol. - frequent neuro checks.    Patient seen by Clance Boll, MSN, APN-BC, nurse practitioner and by MD. Note/plan to be edited by MD as needed.  Pager: [phone]  Attending attestation  Patient seen, examined, labs,vitals and notes reviewed. Discussed plan with Clance Boll, NP and agree with assessment and plan as documented above. I have independently reviewed the chart, obtained history, review of systems and examined the patient.  This morning the patient was sitting in church and  had sudden onset of shooting pain in his left leg followed by numbness of the entire left lower extremity. He was unable to ambulate and was scared that he developed cancer in his leg. Patient is also currently being worked up for a right lung mass. He also stated that when he crosses his left leg over his right knee it gets numb. We cannot rule out peripheral neuropathy and may benefit from EMG/NCS.    Electronically signed by:  Lynnae Sandhoff, MD Page: [phone] 08/31/2021, 7:43 PM

## 2021-08-31 NOTE — ED Notes (Signed)
Dr. Kathrynn Humble at triage to assess pt.

## 2021-08-31 NOTE — ED Notes (Signed)
The pt refused to have a swallow screen hes angry his daughter is attempting to console him.  He keeps stating that he is going to leave he does not want to go to mri now  edp aware of  his impatience  in not going to [facility]

## 2021-08-31 NOTE — ED Provider Notes (Signed)
Juan Norris   CSN: [id number] Arrival date & time: 08/31/21  1134  An emergency department physician performed an initial assessment on this suspected stroke patient at 1248.  History No chief complaint on file.   Juan Norris is a 70 y.o. male who presents as a code stroke from triage with concern for altered sensation in the left lower leg that started at 1120 this morning.  Level 5 caveat due to acuity patient's presentation on intake.  Patient evaluated by this provider for the first time following code stroke CT which was cleared by neurologist.  Patient states he was sitting in church when he suddenly felt a shooting pain in his left lower leg subsequently followed by complete numbness of the leg and sensation of weakness, inability to walk on it.  States he has history of surgery behind the right knee to remove a mass in the past and has experienced the symptoms before, however not as severe as they were today.  He denies any confusion, difficulty with speech, numbness, tingling, or weakness in his upper extremities or in his right lower extremity.  I personally reviewed this patient's medical records.  Has history of heart failure, renal insufficiency, NSTEMI, and hypercholesterolemia.  He is not anticoagulated.  Follows with [facility] for most of his care.  [name] patient, scheduled for PET scan next week is currently undergoing work-up for right lung mass, concerning for malignancy. 34-pack-year smoking history.  Follows with [facility] pulmonology. Patient is concerned that pain in leg may be related to spread of what he is concerned may be malignant mass in his lung.  HPI     Past Medical History:  Diagnosis Date   Acute renal insufficiency    Anginal pain (HCC)    Chronic lower back pain    "due to 3 ruptured discs in my back" (11/08/2018)   High cholesterol    History of kidney stones 1982   "passed them"   Hypertension     Myocardial infarction ([place]) 58/8502   Systolic heart failure ([place])    Tobacco use     Patient Active Problem List   Diagnosis Date Noted   Pulmonary nodule 77/41/2878   Chronic systolic dysfunction of left ventricle 11/08/2018   Tobacco use 03/31/2018   Acute combined systolic and diastolic heart failure ([place]) 03/31/2018   Acute renal insufficiency 03/31/2018   Elevated troponin    NSTEMI (non-ST elevated myocardial infarction) ([place]) 03/29/2018   Chest pain 04/07/2012   History of heart attack 04/07/2012    Past Surgical History:  Procedure Laterality Date   BIV ICD INSERTION CRT-D N/A 11/08/2018    [employer]onic biventricular ICD implantation by Dr Rayann Heman with His Bundle pacing and a MDT 6767 lead placed into the LV port.    CARDIAC CATHETERIZATION     INGUINAL HERNIA REPAIR Bilateral    KNEE SURGERY Left 1990s   "growth on the back of my knee removed"   LEFT HEART CATH AND CORONARY ANGIOGRAPHY N/A 03/30/2018   Procedure: LEFT HEART CATH AND CORONARY ANGIOGRAPHY;  Surgeon: Burnell Blanks, MD;  Location: [facility];  Service: Cardiovascular;  Laterality: N/A;       Family History  Problem Relation Age of Onset   CAD Brother     Social History   Tobacco Use   Smoking status: Some Days    Packs/day: 0.14    Years: 47.00    Pack years: 6.58  Types: Cigarettes   Smokeless tobacco: Never   Tobacco comments:    3 cigarettes smoked daily  ARJ 08/20/21  Vaping Use   Vaping Use: Never used  Substance Use Topics   Alcohol use: Not Currently    Comment: 11/08/2018 "stopped 09/23/1998"   Drug use: Not Currently    Comment: 11/08/2018 "nothing in the 2000s"    Home Medications Prior to Admission medications   Medication Sig Start Date End Date Taking? Authorizing Provider  aspirin 81 MG EC tablet Take 1 tablet (81 mg total) by mouth daily. 11/09/18  Yes Baldwin Jamaica, PA-C  atorvastatin (LIPITOR) 40 MG tablet TAKE 1 TABLET BY MOUTH ONCE DAILY .  APPOINTMENT REQUIRED FOR FUTURE REFILLS BEFORE  MARCH  2022 Patient taking differently: Take 40 mg by mouth daily. 02/24/21  Yes Dorothy Spark, MD  carvedilol (COREG) 6.25 MG tablet Take 1 tablet (6.25 mg total) by mouth 2 (two) times daily with a meal. Patient taking differently: Take 6.25 mg by mouth daily. 10/31/20  Yes Seiler, Luetta Nutting K, NP  Multiple Vitamin (MULTIVITAMIN WITH MINERALS) TABS tablet Take 1 tablet by mouth daily.   Yes [provider]  sacubitril-valsartan (ENTRESTO) 24-26 MG Take 1 tablet by mouth 2 (two) times daily. Please make overdue appt with Dr. Meda Coffee before anymore refills. 1st attempt 08/29/20  Yes Dorothy Spark, MD  spironolactone (ALDACTONE) 25 MG tablet Take 0.5 tablets (12.5 mg total) by mouth daily. 03/11/20  Yes Dorothy Spark, MD  atorvastatin (LIPITOR) 40 MG tablet Take 1 tablet by mouth once daily Patient not taking: No sig reported 02/24/21   Dorothy Spark, MD    Allergies    Patient has no known allergies.  Review of Systems   Review of Systems  Constitutional: Negative.   HENT: Negative.    Eyes: Negative.   Respiratory: Negative.    Cardiovascular: Negative.   Gastrointestinal: Negative.   Genitourinary: Negative.   Musculoskeletal: Negative.   Skin: Negative.   Neurological:  Positive for weakness and numbness. Negative for tremors, seizures, syncope, facial asymmetry, light-headedness and headaches.   Physical Exam Updated Vital Signs BP (!) 141/49   Pulse 60   Temp 98.4 F (36.9 C) (Oral)   Resp 13   Ht 5\' 7"  (1.702 m)   Wt 70 kg   SpO2 99%   BMI 24.17 kg/m   Physical Exam Vitals and nursing Norris reviewed.  Constitutional:      General: He is not in acute distress.    Appearance: He is normal weight. He is not ill-appearing or toxic-appearing.  HENT:     Head: Normocephalic and atraumatic.     Nose: Nose normal.     Mouth/Throat:     Mouth: Mucous membranes are moist.     Pharynx: Oropharynx is clear.  Uvula midline. No oropharyngeal exudate or posterior oropharyngeal erythema.     Tonsils: No tonsillar exudate.  Eyes:     General: Lids are normal. Vision grossly intact.        Right eye: No discharge.        Left eye: No discharge.     Extraocular Movements: Extraocular movements intact.     Conjunctiva/sclera: Conjunctivae normal.     Pupils: Pupils are equal, round, and reactive to light.     Visual Fields: Right eye visual fields normal and left eye visual fields normal.  Neck:     Trachea: Trachea and phonation normal.     Meningeal: Brudzinski's sign  and Kernig's sign absent.  Cardiovascular:     Rate and Rhythm: Normal rate and regular rhythm.     Pulses: Normal pulses.     Heart sounds: Normal heart sounds. No murmur heard. Pulmonary:     Effort: Pulmonary effort is normal. No tachypnea, bradypnea, accessory muscle usage, prolonged expiration or respiratory distress.     Breath sounds: Normal breath sounds. No wheezing or rales.  Chest:     Chest wall: No mass, lacerations, deformity, swelling, tenderness, crepitus or edema.  Abdominal:     General: Bowel sounds are normal. There is no distension.     Palpations: Abdomen is soft.     Tenderness: There is no abdominal tenderness. There is no right CVA tenderness, left CVA tenderness, guarding or rebound.  Musculoskeletal:        General: No deformity.     Cervical back: Normal range of motion and neck supple. No edema, rigidity, tenderness or crepitus. No pain with movement, spinous process tenderness or muscular tenderness.     Right lower leg: No edema.     Left lower leg: No edema.     Comments: 5/5 strength in plantar and dorsiflexion bilaterally, 5/5 strength in hip flexion bilaterally.  TTP in the popliteal fossa of the left knee 2+ pedal pulses bilaterally with normal capillary refill  Strength difficult to assess in the LLE due to pain in the popliteal space   Lymphadenopathy:     Cervical: No cervical  adenopathy.  Skin:    General: Skin is warm and dry.     Capillary Refill: Capillary refill takes less than 2 seconds.  Neurological:     Mental Status: He is alert and oriented to person, place, and time. Mental status is at baseline.     GCS: GCS eye subscore is 4. GCS verbal subscore is 5. GCS motor subscore is 6.     Cranial Nerves: Cranial nerves are intact.     Sensory: Sensory deficit present.     Motor: No tremor, abnormal muscle tone or pronator drift.     Coordination: Coordination is intact.     Comments: Altered sensation in the distal in the left lower leg.  Vascularly intact. Right sided facial twitching intermittently, though patient states he does not notice this.   Psychiatric:        Mood and Affect: Mood normal.    ED Results / Procedures / Treatments   Labs (all labs ordered are listed, but only abnormal results are displayed) Labs Reviewed  CBC - Abnormal; Notable for the following components:      Result Value   WBC 11.0 (*)    All other components within normal limits  DIFFERENTIAL - Abnormal; Notable for the following components:   Neutro Abs 8.0 (*)    All other components within normal limits  COMPREHENSIVE METABOLIC PANEL - Abnormal; Notable for the following components:   Glucose, Bld 159 (*)    All other components within normal limits  I-STAT CHEM 8, ED - Abnormal; Notable for the following components:   Glucose, Bld 153 (*)    Calcium, Ion 1.09 (*)    All other components within normal limits  CBG MONITORING, ED - Abnormal; Notable for the following components:   Glucose-Capillary 157 (*)    All other components within normal limits  PROTIME-INR  APTT    EKG None  Radiology CT HEAD CODE STROKE WO CONTRAST  Result Date: 08/31/2021 CLINICAL DATA:  Code stroke. Neuro deficit, acute,  stroke suspected. Left leg weakness. EXAM: CT HEAD WITHOUT CONTRAST TECHNIQUE: Contiguous axial images were obtained from the base of the skull through the  vertex without intravenous contrast. COMPARISON:  None. FINDINGS: Brain: There is no evidence of an acute infarct, intracranial hemorrhage, mass, midline shift, or extra-axial fluid collection. The ventricles and sulci are normal. Vascular: Calcified atherosclerosis at the skull base. No hyperdense vessel. Skull: No fracture suspicious osseous lesion. Sinuses/Orbits: Mild mucosal thickening in the paranasal sinuses. Clear mastoid air cells. Unremarkable orbits. Other: None. ASPECTS [facility] Stroke Program Early CT Score) - Ganglionic level infarction (caudate, lentiform nuclei, internal capsule, insula, M1-M3 cortex): 7 - Supraganglionic infarction (M4-M6 cortex): 3 Total score (0-10 with 10 being normal): 10 IMPRESSION: Unremarkable CT appearance of the brain. ASPECTS of 10. These results were communicated to Dr. Theda Sers at 1:02 pm on 08/31/2021 by text page via the [facility] messaging system. Electronically Signed   By: Logan Bores M.D.   On: 08/31/2021 13:03    Procedures .Critical Care Performed by: Emeline Darling, PA-C Authorized by: Emeline Darling, PA-C   Critical care provider statement:    Critical care time (minutes):  45   Critical care was time spent personally by me on the following activities:  Discussions with consultants, evaluation of patient's response to treatment, examination of patient, ordering and performing treatments and interventions, ordering and review of laboratory studies, ordering and review of radiographic studies, pulse oximetry, re-evaluation of patient's condition, obtaining history from patient or surrogate and review of old charts   Medications Ordered in ED Medications  sodium chloride flush (NS) 0.9 % injection 3 mL (3 mLs Intravenous Given 08/31/21 1311)    ED Course  I have reviewed the triage vital signs and the nursing notes.  Pertinent labs & imaging results that were available during my care of the patient were reviewed by me and considered in  my medical decision making (see chart for details).    MDM Rules/Calculators/A&P                         70 year old male who presents with concern for decreased sensation, pain in the left lower leg.  Patient initially evaluated in [place] [facility] with neurology, Dr. Theda Sers and NP Kirby-Graham at the bedside.  CT head code stroke unremarkable.  Neurologic exam performed by neuro providers at the bedside very reassuring.  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  Patient does have history of similar symptoms to today's.  CBC reassuring with mild leukocytosis of 11,000.  CMP unremarkable, coags normal.  EKG reassuring without  ischemic changes.  Atrial sensed ventricular paced rhythm.  Given facial twitch, will proceed with MRI of the brain with and without, per neurology.  Would like to evaluate for possible mets that could result in subclinical seizures presenting as facial twitch.  Additionally we will proceed with MRI of the lumbar spine to evaluate for any cord impingement or spinal stenosis that could contribute to patient's left lower extremity symptoms.  Per neurology, pending normal MRI patient may be discharged home with close outpatient follow-up.  If MRI of the brain with concerning findings patient would require admission for further work-up.  Appreciate the collaboration of care of this patient.  At time of shift change patient was signed out to oncoming ED provider Margarita Mail,  PA-C.  All pertinent HPI, physical exam, and laboratory findings were discussed with her prior to my departure.  Alphonse and his daughter at the bedside voiced understanding of his medical evaluation and treatment plan thus far.  Each of their questions was answered to their expressed satisfaction.  They are amenable plan for MRI at this time.  This chart was dictated  using voice recognition software, Dragon. Despite the best efforts of this provider to proofread and correct errors, errors may still occur which can change documentation meaning.  Final Clinical Impression(s) / ED Diagnoses Final diagnoses:  None    Rx / DC Orders ED Discharge Orders     None        Aura Dials 08/31/21 1607    Lucrezia Starch, MD 09/08/21 [phone]

## 2021-08-31 NOTE — ED Notes (Signed)
The pt has passed his swallow screen the pt has spoken to the pt  he has a pacemaker and he cannot have a mri until tomorrow ,  the pt is wanting to leave but he cannot walk on his own.  A hot meal has been delivered he refuses to eat  that  he has been given a [place] sandwich  he wants to be moved to a room with a tv  he does not like this room      he has agreed to stay at present.

## 2021-08-31 NOTE — ED Notes (Signed)
Pt has become agitated, states that "he is ready to go home, he's been sitting here long enough and if someone doesn't let him go home soon he will pull the cords and everything off, get dressed and leave". Advised pt I would let the nurse know. RN notified.

## 2021-08-31 NOTE — ED Provider Notes (Signed)
Patient taken in signout at shift handoff from [facility]. Patient initially presented as a code stroke for sudden onset of altered sensation of the left lower extremity at 1120 in the morning.  The patient had sudden onset of shooting pain in his leg followed by numbness of the entire left lower extremity with inability to ambulate.  Patient is also currently being worked up for a right lung mass.  Seen by neurology who recommends MRI of the head and lumbar spine.  Patient has a pacemaker in place and will be unable to obtain MRI until tomorrow morning as the representative has to come and coordinate for MRI.  Patient therefore will need overnight observation.  Patient upset at wait.  I addressed patient about wit time, options, Risks and signing out AMA. Patient would not respond or look at me, and frequently folded his arms and rolled his eyes. Discussed that I needed patient to use words to respond so that I could evaluate whether or not he had capacity to make decisions. Patient states he is angry with his wait time, being kept without food, and wanting his own room with TV. I explained ED process and use of rooms as they open base on acuity and volume.Patient offered food. Discussed overnight observation and MRI process, why his images are delayed. Patient has greed to stay.  I discussed the case with Dr. Marlowe Sax who will admit.   Margarita Mail, PA-C 09/01/21 1015    [place], [place], MD 09/03/21 [phone]

## 2021-08-31 NOTE — ED Notes (Signed)
Went and heated up his food

## 2021-09-01 ENCOUNTER — Observation Stay (HOSPITAL_COMMUNITY): Payer: No Typology Code available for payment source

## 2021-09-01 DIAGNOSIS — R200 Anesthesia of skin: Secondary | ICD-10-CM | POA: Diagnosis not present

## 2021-09-01 LAB — HIV ANTIBODY (ROUTINE TESTING W REFLEX): HIV Screen 4th Generation wRfx: NONREACTIVE

## 2021-09-01 LAB — HEMOGLOBIN A1C
Hgb A1c MFr Bld: 6.2 % — ABNORMAL HIGH (ref 4.8–5.6)
Mean Plasma Glucose: 131.24 mg/dL

## 2021-09-01 LAB — SARS CORONAVIRUS 2 (TAT 6-24 HRS): SARS Coronavirus 2: NEGATIVE

## 2021-09-01 IMAGING — MR MR HEAD WO/W CM
14 of 16 series · 40 of 48 positions shown · IV contrast (Gadavist)
Comparison: None.

CLINICAL DATA: Neuro deficit, acute, stroke suspected

EXAM:
MRI HEAD WITHOUT AND WITH CONTRAST
TECHNIQUE: Multiplanar, multiecho pulse sequences of the brain and surrounding
structures were obtained without and with intravenous contrast.
CONTRAST:  7mL GADAVIST GADOBUTROL 1 MMOL/ML IV SOLN

[Series 5: DWI · axial · 3.0mm · 0.88mm/px · z∈[-62,+78]mm · 5 of 96 slices shown (1 of 4)]
[im 1/96]
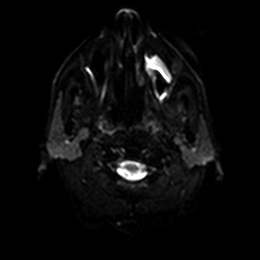
[im 24/96]
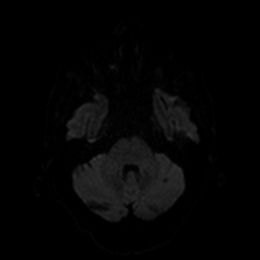
[im 48/96]
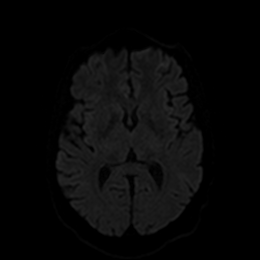
[im 72/96]
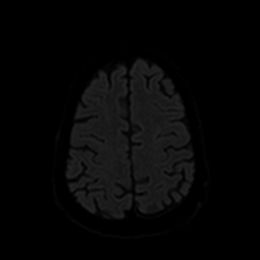
[im 96/96]
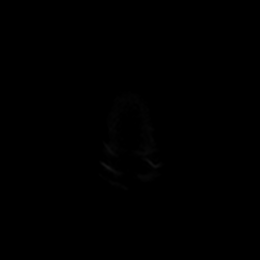

[Series 6: DWI · axial · 3.0mm · 0.88mm/px · z∈[-62,+78]mm · 2 of 48 slices shown (2 of 4)]
[im 1/48]
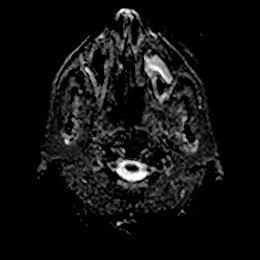
[im 48/48]
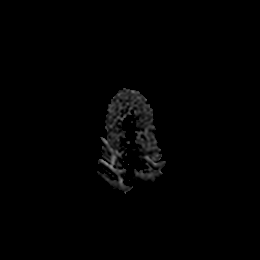

[Series 7: DWI · coronal · 4.0mm · 0.88mm/px · 4 of 72 slices shown (3 of 4)]
[im 1/72]
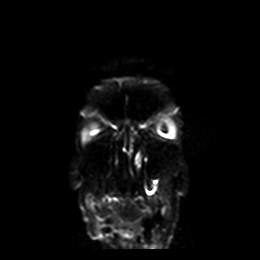
[im 24/72]
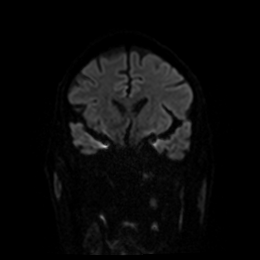
[im 48/72]
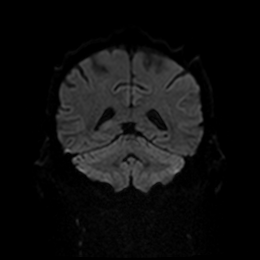
[im 72/72]
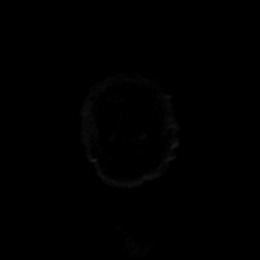

[Series 8: DWI · coronal · 4.0mm · 0.88mm/px · 2 of 36 slices shown (4 of 4)]
[im 1/36]
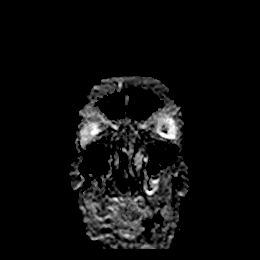
[im 36/36]
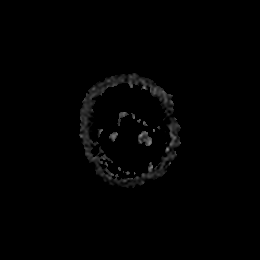

[Series 9: T1 · sagittal · 5.0mm · 0.75mm/px · 2 of 24 slices shown]
[im 1/24]
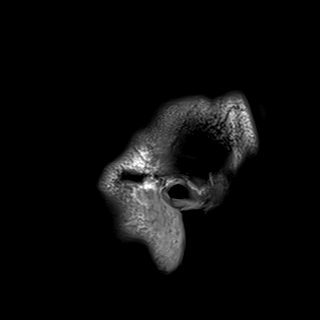
[im 24/24]
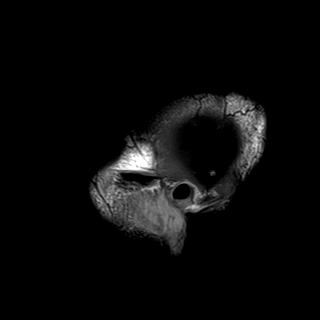

[Series 10: T2 · axial · 5.0mm · 0.72mm/px · z∈[-60,+77]mm · 2 of 24 slices shown]
[im 1/24]
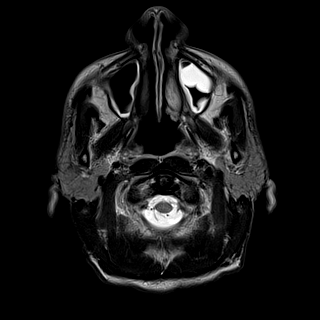
[im 24/24]
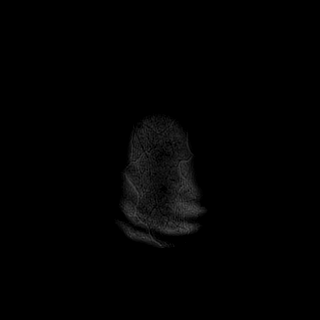

[Series 11: FLAIR · axial · 5.0mm · 0.45mm/px · z∈[-62,+76]mm · 2 of 24 slices shown]
[im 1/24]
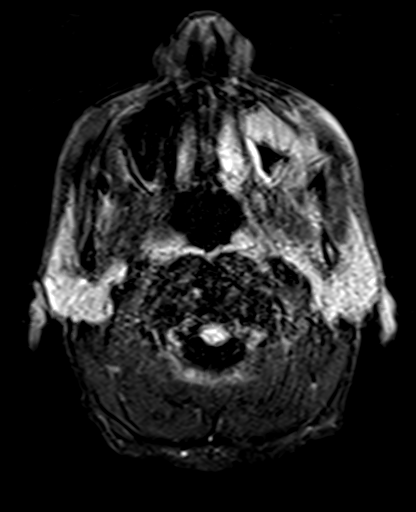
[im 24/24]
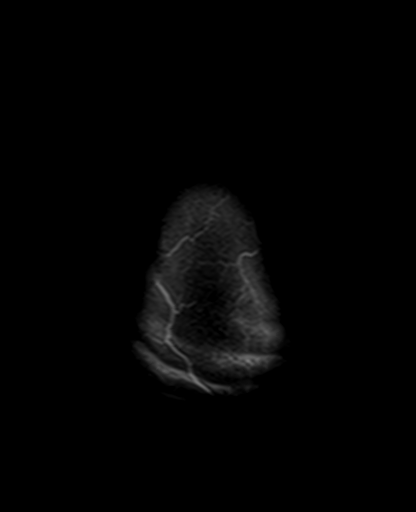

[Series 12: mag_images · axial · 3.0mm · 0.90mm/px · z∈[-82,+82]mm · 4 of 56 slices shown]
[im 1/56]
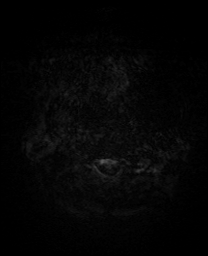
[im 19/56]
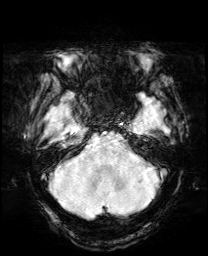
[im 37/56]
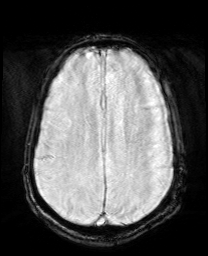
[im 56/56]
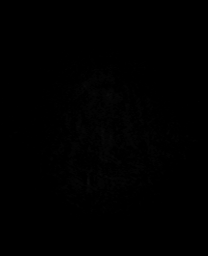

[Series 13: pha_images · axial · 3.0mm · 0.90mm/px · z∈[-79,+79]mm · 4 of 54 slices shown]
[im 1/54]
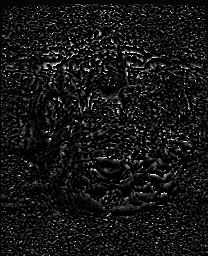
[im 18/54]
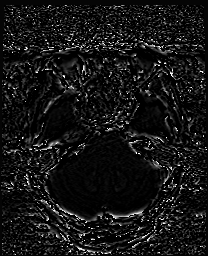
[im 36/54]
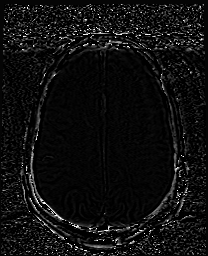
[im 54/54]
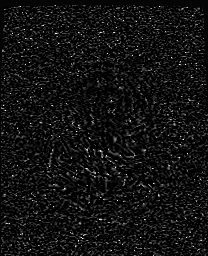

[Series 14: swi_images · axial · 3.0mm · 0.90mm/px · z∈[-82,+82]mm · 4 of 56 slices shown]
[im 1/56]
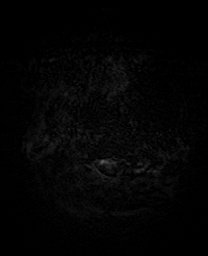
[im 19/56]
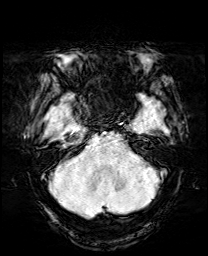
[im 37/56]
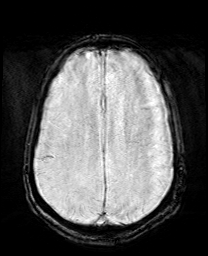
[im 56/56]
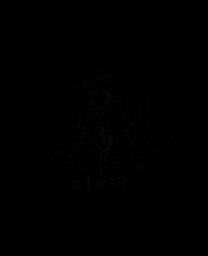

[Series 15: mip_images(sw) · axial · 24.0mm · 0.90mm/px · z∈[-71,+72]mm · 3 of 49 slices shown]
[im 1/49]
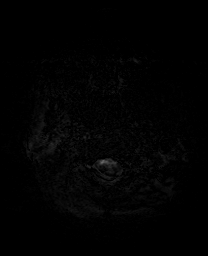
[im 25/49]
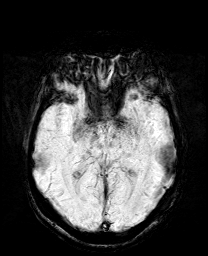
[im 49/49]
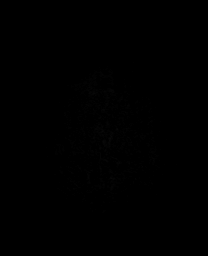

[Series 30: T2 post-contrast · coronal · 5.0mm · 0.72mm/px · 2 of 28 slices shown]
[im 1/28]
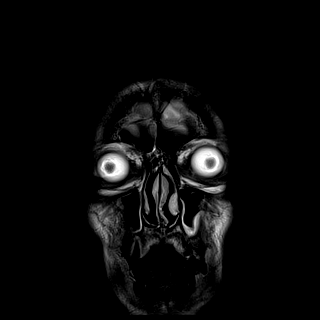
[im 28/28]
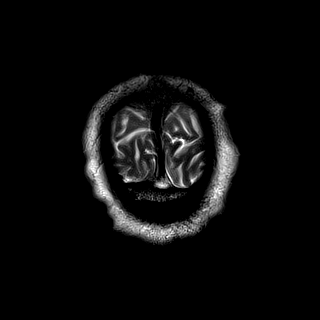

[Series 32: T1 post-contrast · coronal · 5.0mm · 0.34mm/px · 2 of 30 slices shown (1 of 2)]
[im 1/30]
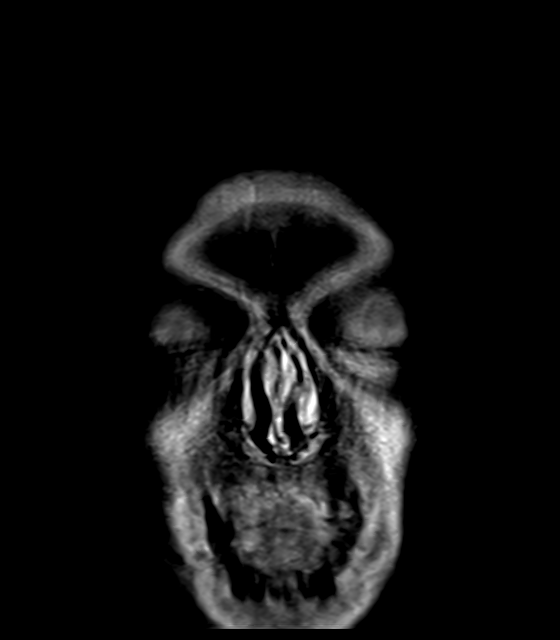
[im 30/30]
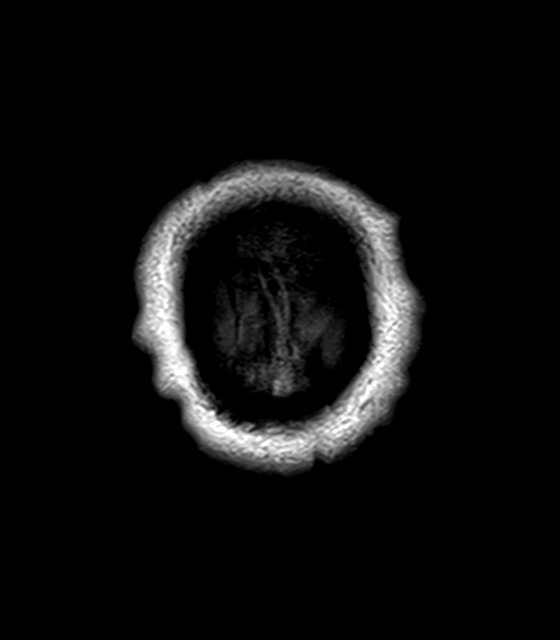

[Series 33: T1 post-contrast · sagittal · 5.0mm · 0.72mm/px · 2 of 24 slices shown (2 of 2)]
[im 1/24]
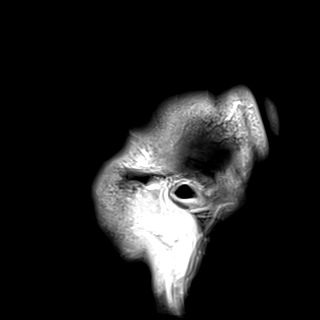
[im 24/24]
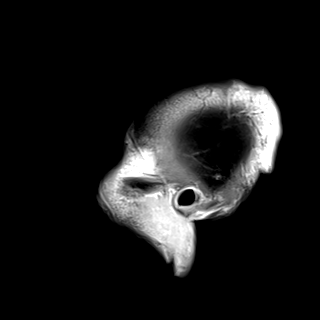

[40 of 48 positions shown; findings below may reference images not displayed]

FINDINGS: Brain: There is no acute infarction or intracranial hemorrhage.
There is no intracranial mass, mass effect, or edema. There is no
hydrocephalus or extra-axial fluid collection. Ventricles and sulci
are normal in size and configuration. A few small foci of T2
hyperintensity in the supratentorial white matter are nonspecific
but may reflect mild chronic microvascular ischemic changes. Small
chronic bilateral cerebellar infarcts. No abnormal enhancement.

Vascular: Major vessel flow voids at the skull base are preserved.

Skull and upper cervical spine: Normal marrow signal is preserved.

Sinuses/Orbits: Mild mucosal thickening.  Orbits are unremarkable.

Other: Sella is unremarkable.  Mastoid air cells are clear.
IMPRESSION: No evidence of recent infarction, hemorrhage, or mass. No abnormal
enhancement.

Mild chronic microvascular ischemic changes. Small chronic
cerebellar infarcts.

## 2021-09-01 IMAGING — MR MR LUMBAR SPINE W/O CM
4 of 5 series · 26 of 48 positions shown · non-contrast
Comparison: [DATE]

CLINICAL DATA: Paresthesia of left leg

EXAM:
MRI LUMBAR SPINE WITHOUT CONTRAST
TECHNIQUE: Multiplanar, multisequence MR imaging of the lumbar spine was
performed. No intravenous contrast was administered.

[Series 25: T2 · sagittal · 4.0mm · 0.73mm/px · 6 of 16 slices shown (1 of 2)]
[im 1/16]
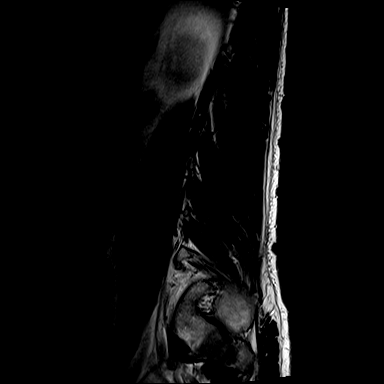
[im 4/16]
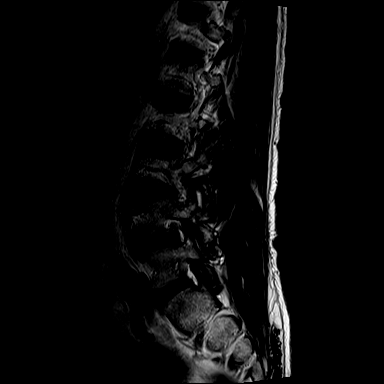
[im 7/16]
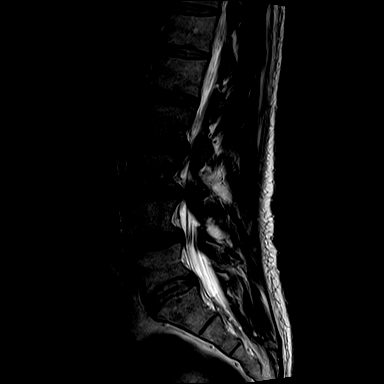
[im 10/16]
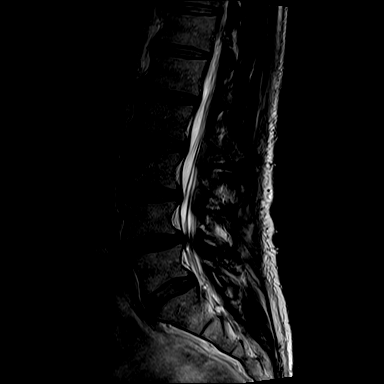
[im 13/16]
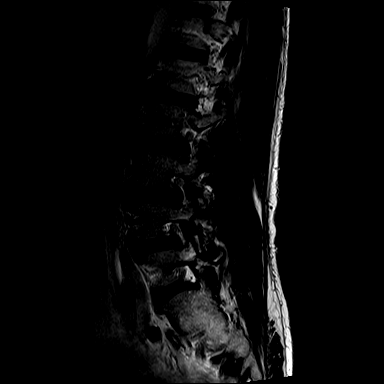
[im 16/16]
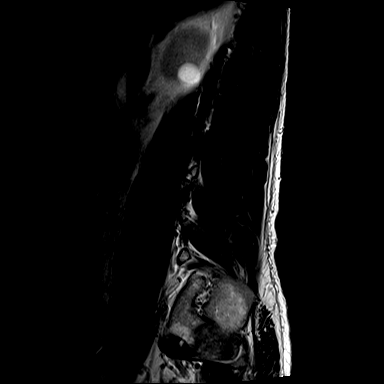

[Series 27: T1 · sagittal · 4.0mm · 0.88mm/px · 6 of 16 slices shown (1 of 2)]
[im 1/16]
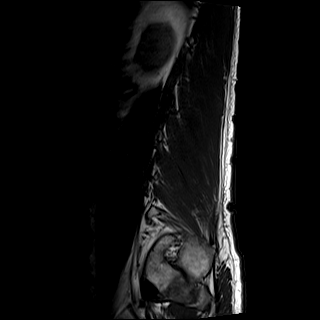
[im 4/16]
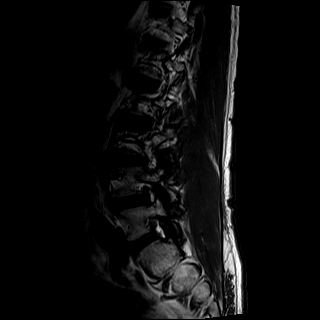
[im 7/16]
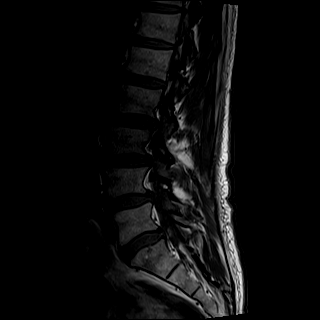
[im 10/16]
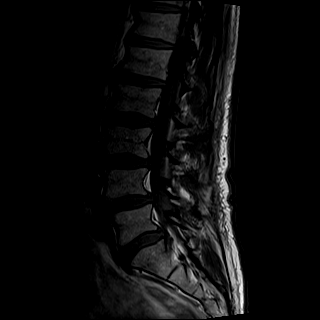
[im 13/16]
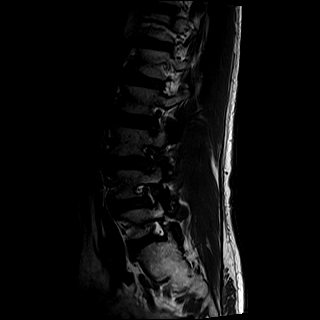
[im 16/16]
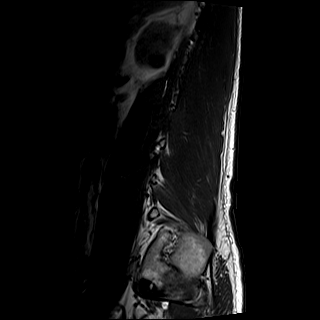

[Series 28: T2 · axial · 4.0mm · 0.57mm/px · z∈[-658,-429]mm · 9 of 37 slices shown (2 of 2)]
[im 1/37]
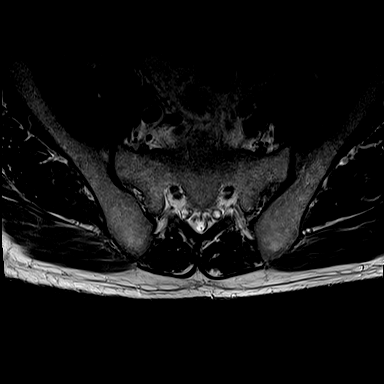
[im 6/37]
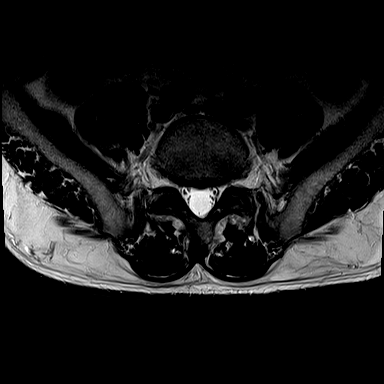
[im 11/37]
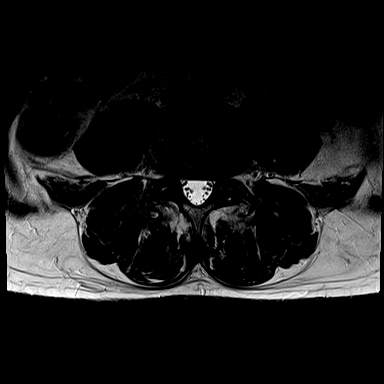
[im 16/37]
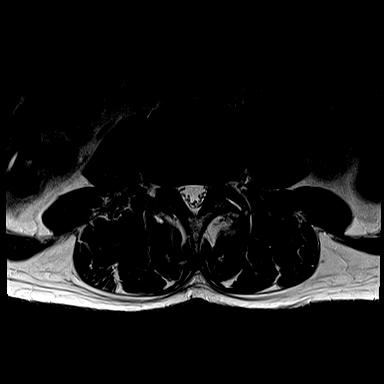
[im 19/37]
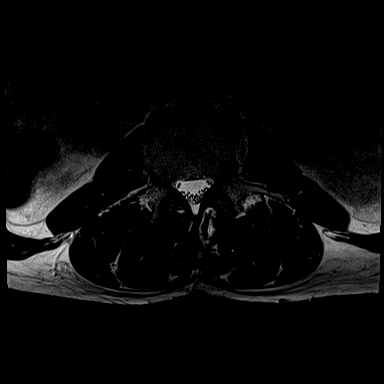
[im 21/37]
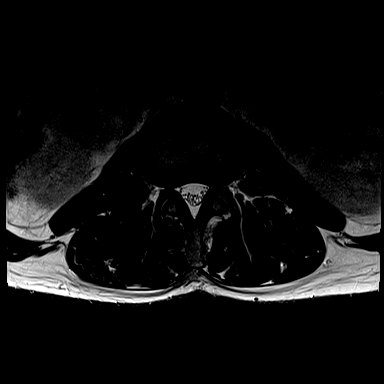
[im 26/37]
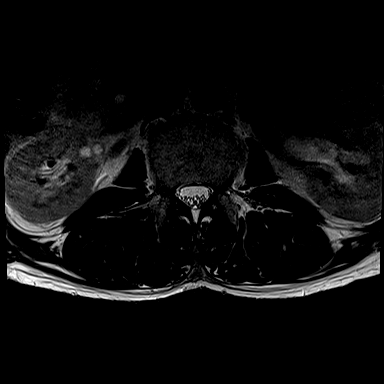
[im 31/37]
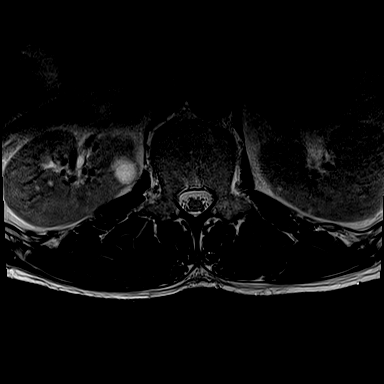
[im 37/37]
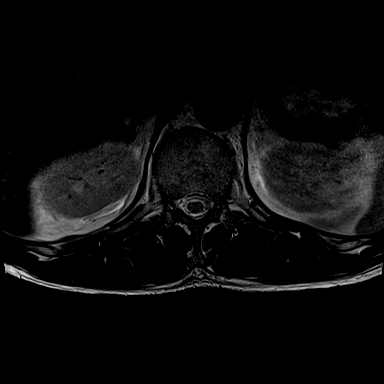

[Series 29: T1 · axial · 4.0mm · 0.34mm/px · z∈[-658,-459]mm · 5 of 37 slices shown (2 of 2)]
[im 1/37]
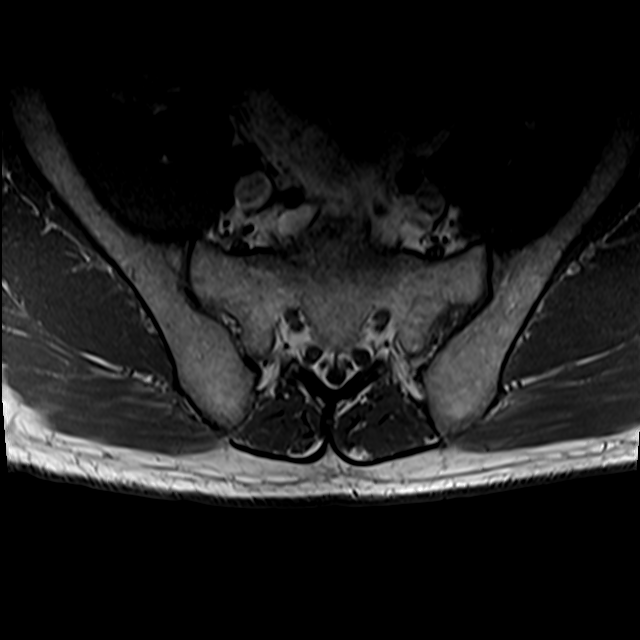
[im 6/37]
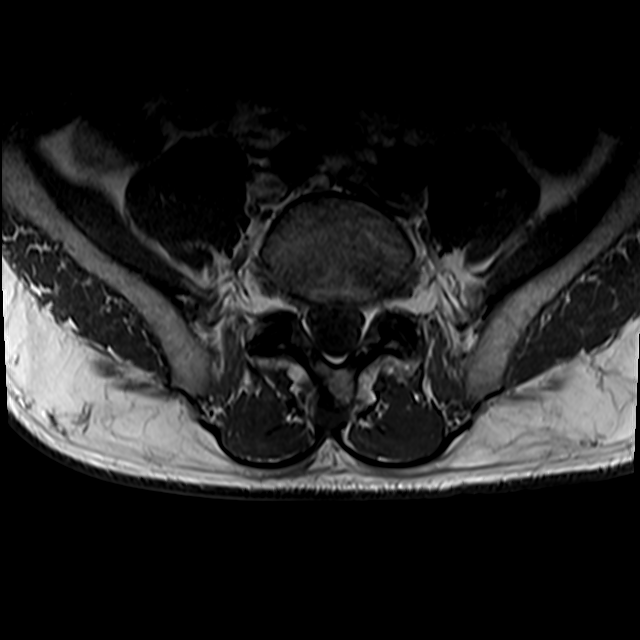
[im 11/37]
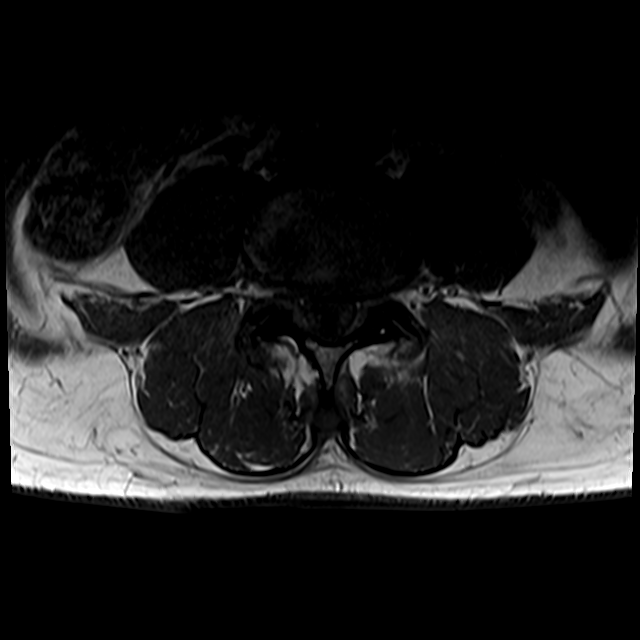
[im 19/37]
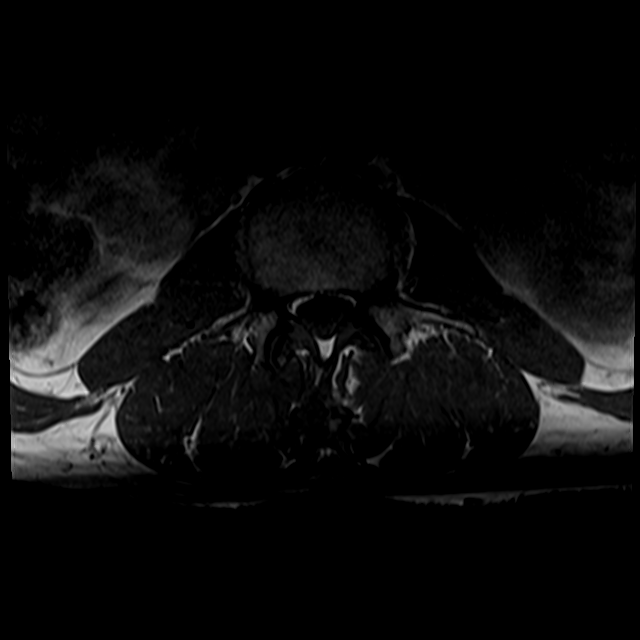
[im 31/37]
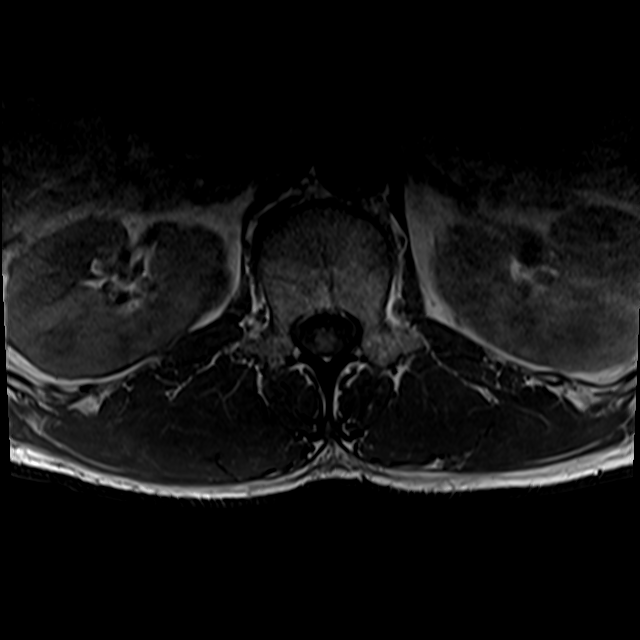

[26 of 48 positions shown; findings below may reference images not displayed]

FINDINGS: Segmentation:  Standard.

Alignment:  Stable.  Trace anterolisthesis at L4-L5.

Vertebrae: Stable vertebral body heights. No significant marrow
edema. No suspicious osseous lesion.

Conus medullaris and cauda equina: Conus extends to the L1-L2 level.
Conus and cauda equina appear normal.

Paraspinal and other soft tissues: Unremarkable

Disc levels:

L1-L2:  No canal or foraminal stenosis.

L2-L3: Disc bulge. No canal stenosis. Slight effacement of
subarticular recesses. Moderate foraminal stenosis.

L3-L4: Disc bulge. Mild facet arthropathy. No canal stenosis. Slight
effacement of subarticular recesses. Moderate foraminal stenosis,
left greater than right.

L4-L5: Disc bulge. Moderate facet arthropathy. No canal stenosis.
Slight effacement of subarticular recesses. Moderate foraminal
stenosis.

L5-S1:  Mild facet arthropathy.  No canal or foraminal stenosis.
IMPRESSION: Multilevel degenerative changes as detailed above are without
substantial change since the prior examination. No high-grade canal
narrowing. Multilevel moderate foraminal stenosis.

## 2021-09-01 MED ORDER — GADOBUTROL 1 MMOL/ML IV SOLN
7.0000 mL | Freq: Once | INTRAVENOUS | Status: AC | PRN
  Administered 2021-09-01: 7 mL via INTRAVENOUS

## 2021-09-01 MED ORDER — CLOPIDOGREL BISULFATE 75 MG PO TABS
75.0000 mg | ORAL_TABLET | Freq: Every day | ORAL | Status: DC
  Administered 2021-09-01: 75 mg via ORAL
  Filled 2021-09-01: qty 1

## 2021-09-01 MED ORDER — ASPIRIN EC 81 MG PO TBEC
81.0000 mg | DELAYED_RELEASE_TABLET | Freq: Every day | ORAL | Status: DC
  Administered 2021-09-01: 81 mg via ORAL
  Filled 2021-09-01: qty 1

## 2021-09-01 MED ORDER — HEPARIN SODIUM (PORCINE) 5000 UNIT/ML IJ SOLN: 5000.0000 [IU] | Freq: Three times a day (TID) | INTRAMUSCULAR | Status: DC

## 2021-09-01 NOTE — ED Notes (Signed)
Patient transported to MRI 

## 2021-09-01 NOTE — ED Notes (Signed)
RN transported pt to MRI at 1300, stayed w/ pt until SWOT RN arrived at [phone]

## 2021-09-01 NOTE — Discharge Summary (Signed)
[name] EUM:[id number] DOB: 1951-11-27 DOA: 08/31/2021  PCP: [facility]  Admit date: 08/31/2021  Discharge date: 09/01/2021  Admitted From: Home  Disposition:  Home   Recommendations for Outpatient Follow-up:   Follow up with PCP in 1-2 weeks  PCP Please obtain BMP/CBC, 2 view CXR in 1week,  (see Discharge instructions)   PCP Please follow up on the following pending results: Needs outpt Neurology follow up   Home Health: None  Equipment/Devices: None Consultations: Neuro Discharge Condition: Stable   CODE STATUS: Full   Diet Recommendation: Heart Healthy    Diet Order             DIET SOFT Room service appropriate? Yes; Fluid consistency: Thin  Diet effective now           Diet - low sodium heart healthy                    No chief complaint on file.    Brief history of present illness from the day of admission and additional interim summary    Juan Norris is a 70 y.o. 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 Lamonte Sakai from pulmonology).  He presents to the ED today as code stroke for evaluation of left leg numbness on top of some chronic low back pain and chronic ?? L.Leg weakness. He was in church and felt sharp L.Leg pain then some numbness.                                                                 Hospital Course     Acute onset left lower extremity weakness/numbness - he has history of chronic low back pain, states he has mild weakness in the left lower extremity which is old, He had MRI Brain, L Spine and  CT scan of  the brain were Non acute, will need outpt Neurology follow up, No deficits on exam, symptoms 70% better per patient, cleared by PT-OT and Neurology. Will DC on home ASA-Statin with PCP and Neuro follow up.    2.  Recently discovered right lower lobe lung nodule.  Follows with Dr. Lamonte Sakai.   3.  Hypertension.  Home Rx.   4.  Anemia.  On statin.  Lipid panel pending from this admission.   5.  CAD with chronic ischemic cardiomyopathy.  Underlying chronic systolic heart failure EF 20 to 25%.  Has biventricular AICD.  Continue Coreg, Entresto and Aldactone + ASA and Statin for secondary prevention.  No acute issues from cardiac standpoint.  Currently appears compensated from CHF  standpoint.   6.  Ongoing smoking.  NicoDerm patch.  Counseled to quit.      Discharge diagnosis     Principal Problem:   Left leg numbness Active Problems:   Tobacco use   Pulmonary nodule   CHF (congestive heart failure) (HCC)   HTN (hypertension)    Discharge instructions    Discharge Instructions     Diet - low sodium heart healthy   Complete by: As directed    Discharge instructions   Complete by: As directed    Follow with Primary MD [facility] in 7 days along with your Neurologist of choice in 1-2 weeks  Get CBC, CMP, Lipid panel -  checked next visit within 1 week by Primary MD    Activity: As tolerated with Full fall precautions use walker/cane & assistance as needed  Disposition Home    Diet: Heart Healthy    Special Instructions: If you have smoked or chewed Tobacco  in the last 2 yrs please stop smoking, stop any regular Alcohol  and or any Recreational drug use.  On your next visit with your primary care physician please Get Medicines reviewed and adjusted.  Please request your Prim.MD to go over all Hospital Tests and Procedure/Radiological results at the follow up, please get all Hospital records sent to your Prim MD by signing hospital release before you go home.  If  you experience worsening of your admission symptoms, develop shortness of breath, life threatening emergency, suicidal or homicidal thoughts you must seek medical attention immediately by calling 911 or calling your MD immediately  if symptoms less severe.  You Must read complete instructions/literature along with all the possible adverse reactions/side effects for all the Medicines you take and that have been prescribed to you. Take any new Medicines after you have completely understood and accpet all the possible adverse reactions/side effects.   Increase activity slowly   Complete by: As directed        Discharge Medications   Allergies as of 09/01/2021   No Known Allergies      Medication List     TAKE these medications    aspirin 81 MG EC tablet Take 1 tablet (81 mg total) by mouth daily.   atorvastatin 40 MG tablet Commonly known as: LIPITOR TAKE 1 TABLET BY MOUTH ONCE DAILY . APPOINTMENT REQUIRED FOR FUTURE REFILLS BEFORE  MARCH  2022 What changed:  See the new instructions. Another medication with the same name was removed. Continue taking this medication, and follow the directions you see here.   carvedilol 6.25 MG tablet Commonly known as: COREG Take 1 tablet (6.25 mg total) by mouth 2 (two) times daily with a meal. What changed: when to take this   Entresto 24-26 MG Generic drug: sacubitril-valsartan Take 1 tablet by mouth 2 (two) times daily. Please make overdue appt with Dr. Meda Coffee before anymore refills. 1st attempt   multivitamin with minerals Tabs tablet Take 1 tablet by mouth daily.   spironolactone 25 MG tablet Commonly known as: ALDACTONE Take 0.5 tablets (12.5 mg total) by mouth daily.         Follow-up Information     [facility] Schedule an appointment as soon as possible for a visit in 1 week(s).   Why: and your Neurologist in 1-2 weeks Contact information: [place]  [address] [phone]         Dorothy Spark, MD. Schedule an appointment as soon as possible for  a visit in 1 week(s).   Specialty: Cardiology Why: your cardiologist Contact information: [place] [address] [phone]                 Major procedures and Radiology Reports - PLEASE review detailed and final reports thoroughly  -      MR BRAIN W WO CONTRAST  Result Date: 09/01/2021 CLINICAL DATA:  Neuro deficit, acute, stroke suspected EXAM: MRI HEAD WITHOUT AND WITH CONTRAST TECHNIQUE: Multiplanar, multiecho pulse sequences of the brain and surrounding structures were obtained without and with intravenous contrast. CONTRAST:  9mL GADAVIST GADOBUTROL 1 MMOL/ML IV SOLN COMPARISON:  None. FINDINGS: Brain: There is no acute infarction or intracranial hemorrhage. There is no intracranial mass, mass effect, or edema. There is no hydrocephalus or extra-axial fluid collection. Ventricles and sulci are normal in size and configuration. A few small foci of T2 hyperintensity in the supratentorial white matter are nonspecific but may reflect mild chronic microvascular ischemic changes. Small chronic bilateral cerebellar infarcts. No abnormal enhancement. Vascular: Major vessel flow voids at the skull base are preserved. Skull and upper cervical spine: Normal marrow signal is preserved. Sinuses/Orbits: Mild mucosal thickening.  Orbits are unremarkable. Other: Sella is unremarkable.  Mastoid air cells are clear. IMPRESSION: No evidence of recent infarction, hemorrhage, or mass. No abnormal enhancement. Mild chronic microvascular ischemic changes. Small chronic cerebellar infarcts. Electronically Signed   By: Macy Mis M.D.   On: 09/01/2021 14:33   MR LUMBAR SPINE WO CONTRAST  Result Date: 09/01/2021 CLINICAL DATA:  Paresthesia of left leg EXAM: MRI LUMBAR SPINE WITHOUT CONTRAST TECHNIQUE: Multiplanar, multisequence MR imaging of the lumbar spine was  performed. No intravenous contrast was administered. COMPARISON:  July 2022 FINDINGS: Segmentation:  Standard. Alignment:  Stable.  Trace anterolisthesis at L4-L5. Vertebrae: Stable vertebral body heights. No significant marrow edema. No suspicious osseous lesion. Conus medullaris and cauda equina: Conus extends to the L1-L2 level. Conus and cauda equina appear normal. Paraspinal and other soft tissues: Unremarkable Disc levels: L1-L2:  No canal or foraminal stenosis. L2-L3: Disc bulge. No canal stenosis. Slight effacement of subarticular recesses. Moderate foraminal stenosis. L3-L4: Disc bulge. Mild facet arthropathy. No canal stenosis. Slight effacement of subarticular recesses. Moderate foraminal stenosis, left greater than right. L4-L5: Disc bulge. Moderate facet arthropathy. No canal stenosis. Slight effacement of subarticular recesses. Moderate foraminal stenosis. L5-S1:  Mild facet arthropathy.  No canal or foraminal stenosis. IMPRESSION: Multilevel degenerative changes as detailed above are without substantial change since the prior examination. No high-grade canal narrowing. Multilevel moderate foraminal stenosis. Electronically Signed   By: Macy Mis M.D.   On: 09/01/2021 14:45   CUP PACEART REMOTE DEVICE CHECK  Result Date: 08/19/2021 Scheduled remote reviewed. Normal device function.  Next remote 91 days. Kathy Breach, RN, CCDS, [employer]  CT HEAD CODE STROKE WO CONTRAST  Result Date: 08/31/2021 CLINICAL DATA:  Code stroke. Neuro deficit, acute, stroke suspected. Left leg weakness. EXAM: CT HEAD WITHOUT CONTRAST TECHNIQUE: Contiguous axial images were obtained from the base of the skull through the vertex without intravenous contrast. COMPARISON:  None. FINDINGS: Brain: There is no evidence of an acute infarct, intracranial hemorrhage, mass, midline shift, or extra-axial fluid collection. The ventricles and sulci are normal. Vascular: Calcified atherosclerosis at the skull base. No  hyperdense vessel. Skull: No fracture suspicious osseous lesion. Sinuses/Orbits: Mild mucosal thickening in the paranasal sinuses. Clear mastoid air cells. Unremarkable orbits. Other: None. ASPECTS [facility] Stroke Program Early CT  Score) - Ganglionic level infarction (caudate, lentiform nuclei, internal capsule, insula, M1-M3 cortex): 7 - Supraganglionic infarction (M4-M6 cortex): 3 Total score (0-10 with 10 being normal): 10 IMPRESSION: Unremarkable CT appearance of the brain. ASPECTS of 10. These results were communicated to Dr. Theda Sers at 1:02 pm on 08/31/2021 by text page via the [facility] messaging system. Electronically Signed   By: Logan Bores M.D.   On: 08/31/2021 13:03      Today   Subjective    Juan Norris today has no headache,no chest abdominal pain,no new weakness tingling or numbness, feels much better wants to go home today.     Objective   Blood pressure (!) 138/59, pulse 70, temperature 98.1 F (36.7 C), temperature source Oral, resp. rate 20, height 5\' 7"  (1.702 m), weight 70 kg, SpO2 98 %.  No intake or output data in the 24 hours ending 09/01/21 1507  Exam  Awake Alert, No new F.N deficits, Normal affect [place].AT,PERRAL Supple Neck,No JVD, No cervical lymphadenopathy appriciated.  Symmetrical Chest wall movement, Good air movement bilaterally, CTAB RRR,No Gallops,Rubs or new Murmurs, No Parasternal Heave +ve B.Sounds, Abd Soft, Non tender, No organomegaly appriciated, No rebound -guarding or rigidity. No Cyanosis, mild R foot L5-S1 numbness   Data Review   CBC w Diff:  Lab Results  Component Value Date   WBC 11.0 (H) 08/31/2021   HGB 13.9 08/31/2021   HGB 13.6 02/12/2021   HCT 41.0 08/31/2021   HCT 40.1 02/12/2021   PLT 210 08/31/2021   PLT 219 02/12/2021   LYMPHOPCT 19 08/31/2021   MONOPCT 7 08/31/2021   EOSPCT 2 08/31/2021   BASOPCT 0 08/31/2021    CMP:  Lab Results  Component Value Date   NA 142 08/31/2021   NA 139 02/12/2021   K 4.5 08/31/2021   CL  110 08/31/2021   CO2 25 08/31/2021   BUN 13 08/31/2021   BUN 11 02/12/2021   CREATININE 1.10 08/31/2021   PROT 6.9 08/31/2021   PROT 7.1 02/12/2021   ALBUMIN 3.7 08/31/2021   ALBUMIN 4.1 02/12/2021   BILITOT 0.7 08/31/2021   BILITOT 0.5 02/12/2021   ALKPHOS 81 08/31/2021   AST 25 08/31/2021   ALT 21 08/31/2021  . Lab Results  Component Value Date   HGBA1C 6.2 (H) 08/31/2021     Total Time in preparing paper work, data evaluation and todays exam - 74 minutes  Lala Lund M.D on 09/01/2021 at 3:07 PM  [facility]

## 2021-09-01 NOTE — Progress Notes (Signed)
Patient programmed EQJ 48. Will re-program once scan is completed.

## 2021-09-01 NOTE — Progress Notes (Signed)
SLP Cancellation Note  Patient Details Name: LINC RENNE MRN: [id number] DOB: 1951/09/10   Cancelled treatment:       Reason Eval/Treat Not Completed: SLP screened, no needs identified, will sign off. Pt appropriately passed [facility] swallow screen with RN and has initiated diet. Will defer SLP eval.    Carlie Solorzano, Katherene Ponto 09/01/2021, 10:50 AM

## 2021-09-01 NOTE — ED Notes (Signed)
Pt is back from MRI. Pt wants to leave at this time and is requesting to talk to admit MD. MD paged at this time. Pt states he has PET scan tomorrow near [facility] and he does not want to miss the schedule. Waiting for MD to call back.

## 2021-09-01 NOTE — Evaluation (Signed)
Physical Therapy Evaluation and Discharge Patient Details Name: Juan Norris MRN: [id number] DOB: 1950-12-29 Today's Date: 09/01/2021  History of Present Illness  Pt is a 70 y/o male admitted 9/18 secondary to LLE numbness. CT negative, however, awaiting MRI of head and lumbar spine. PMH includes CHF tobacco use, and HTN.  Clinical Impression  Patient evaluated by Physical Therapy with no further acute PT needs identified. All education has been completed and the patient has no further questions. Pt overall steady and able to perform horizontal/vertical head turns without LOB. Pt reporting numbness in L foot and lateral aspect of calf, but did not seem to affect mobility. Educated about importance of appropriate foot care at d/c. Reports grandson can assist if needed. See below for any follow-up Physical Therapy or equipment needs. PT is signing off. Thank you for this referral. If needs change, please re-consult.         Recommendations for follow up therapy are one component of a multi-disciplinary discharge planning process, led by the attending physician.  Recommendations may be updated based on patient status, additional functional criteria and insurance authorization.  Follow Up Recommendations No PT follow up    Equipment Recommendations  None recommended by PT    Recommendations for Other Services       Precautions / Restrictions Precautions Precautions: None Restrictions Weight Bearing Restrictions: No      Mobility  Bed Mobility Overal bed mobility: Independent                  Transfers Overall transfer level: Needs assistance Equipment used: None Transfers: Sit to/from Stand Sit to Stand: Supervision         General transfer comment: Supervision for safety. No LOB noted. Pt reports he was unable to feel L foot completely on floor and report it felt like there was a wedge underneath his foot.  Ambulation/Gait Ambulation/Gait assistance: Supervision Gait  Distance (Feet): 120 Feet Assistive device: None Gait Pattern/deviations: Step-through pattern;Decreased stride length Gait velocity: Decreased   General Gait Details: Slow, guarded gait. No LOB noted when performing horizontal and vertical head turns.  Stairs            Wheelchair Mobility    Modified Rankin (Stroke Patients Only) Modified Rankin (Stroke Patients Only) Pre-Morbid Rankin Score: No symptoms Modified Rankin: Moderately severe disability     Balance Overall balance assessment: Mild deficits observed, not formally tested                                           Pertinent Vitals/Pain Pain Assessment: No/denies pain    Home Living Family/patient expects to be discharged to:: Private residence Living Arrangements: Other relatives (grandson) Available Help at Discharge: Family;Available PRN/intermittently Type of Home: House Home Access: Level entry     Home Layout: One level Home Equipment: None      Prior Function Level of Independence: Independent               Hand Dominance        Extremity/Trunk Assessment   Upper Extremity Assessment Upper Extremity Assessment: Defer to OT evaluation    Lower Extremity Assessment Lower Extremity Assessment: LLE deficits/detail;RLE deficits/detail RLE Deficits / Details: Reporting some patchy numbness on top of foot and one spot in lateral aspect of thigh. MMT at 5/5 throughout. LLE Deficits / Details: Reporting numbness throughout foot and lateral  aspect of calf. Reports posterior calf feels normal. Grossly 4+/5 throughout.    Cervical / Trunk Assessment Cervical / Trunk Assessment: Normal  Communication   Communication: No difficulties  Cognition Arousal/Alertness: Awake/alert Behavior During Therapy: WFL for tasks assessed/performed Overall Cognitive Status: Within Functional Limits for tasks assessed                                        General  Comments General comments (skin integrity, edema, etc.): Educated about importance of foot care given numbness.    Exercises     Assessment/Plan    PT Assessment Patent does not need any further PT services  PT Problem List         PT Treatment Interventions      PT Goals (Current goals can be found in the Care Plan section)  Acute Rehab PT Goals Patient Stated Goal: to figure out what is going on with his left leg PT Goal Formulation: With patient Time For Goal Achievement: 09/01/21 Potential to Achieve Goals: Good    Frequency     Barriers to discharge        Co-evaluation               AM-PAC PT "6 Clicks" Mobility  Outcome Measure Help needed turning from your back to your side while in a flat bed without using bedrails?: None Help needed moving from lying on your back to sitting on the side of a flat bed without using bedrails?: None Help needed moving to and from a bed to a chair (including a wheelchair)?: None Help needed standing up from a chair using your arms (e.g., wheelchair or bedside chair)?: None Help needed to walk in hospital room?: None Help needed climbing 3-5 steps with a railing? : A Little 6 Click Score: 23    End of Session Equipment Utilized During Treatment: Gait belt Activity Tolerance: Patient tolerated treatment well Patient left: in bed;with call bell/phone within reach (on bed in ED) Nurse Communication: Mobility status PT Visit Diagnosis: Other symptoms and signs involving the nervous system (D98.338)    Time: 2505-3976 PT Time Calculation (min) (ACUTE ONLY): 16 min   Charges:   PT Evaluation $PT Eval Low Complexity: 1 Low          Lou Miner, DPT  Acute Rehabilitation Services  Pager: [phone] Office: [phone]   Rudean Hitt 09/01/2021, 10:11 AM

## 2021-09-01 NOTE — ED Notes (Signed)
Pt had a cramp and was able to walk to the bathroom.

## 2021-09-01 NOTE — Progress Notes (Signed)
OT Cancellation Note  Patient Details Name: POOKELA SELLIN MRN: [id number] DOB: [birth year]   Cancelled Treatment:    Reason Eval/Treat Not Completed: OT screened, no needs identified, will sign off  Carissa Musick,HILLARY 09/01/2021, 10:51 AM Maurie Boettcher, OT/L   Acute OT Clinical Specialist Acute Rehabilitation Services Pager [phone] Office [phone]

## 2021-09-01 NOTE — Discharge Instructions (Signed)
Follow with Primary MD [facility] in 7 days along with your Neurologist of choice in 1-2 weeks  Get CBC, CMP, Lipid panel -  checked next visit within 1 week by Primary MD    Activity: As tolerated with Full fall precautions use walker/cane & assistance as needed  Disposition Home    Diet: Heart Healthy    Special Instructions: If you have smoked or chewed Tobacco  in the last 2 yrs please stop smoking, stop any regular Alcohol  and or any Recreational drug use.  On your next visit with your primary care physician please Get Medicines reviewed and adjusted.  Please request your Prim.MD to go over all Hospital Tests and Procedure/Radiological results at the follow up, please get all Hospital records sent to your Prim MD by signing hospital release before you go home.  If you experience worsening of your admission symptoms, develop shortness of breath, life threatening emergency, suicidal or homicidal thoughts you must seek medical attention immediately by calling 911 or calling your MD immediately  if symptoms less severe.  You Must read complete instructions/literature along with all the possible adverse reactions/side effects for all the Medicines you take and that have been prescribed to you. Take any new Medicines after you have completely understood and accpet all the possible adverse reactions/side effects.

## 2021-09-01 NOTE — ED Notes (Signed)
House tray was ordered for breakfast.

## 2021-09-01 NOTE — ED Notes (Signed)
Admitting paged to [place] per her request

## 2021-09-01 NOTE — Progress Notes (Signed)
PROGRESS NOTE                                                                                                                                                                                                             Patient Demographics:    Juan Norris, is a 70 y.o. male, DOB - [birth year], MNO:[id number]  Outpatient Primary MD for the patient is [facility], [facility]    LOS - 0  Admit date - 08/31/2021    No chief complaint on file.      Brief Narrative (HPI from H&P) - : Juan Norris is a 70 y.o. 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 Lamonte Sakai from pulmonology).  He presents to the ED today as code stroke for evaluation of left leg numbness on top of some chronic low back pain and chronic ?? L.Leg weakness.   Subjective:    Juan Norris today has, No headache, No chest pain, No abdominal pain - No Nausea, no SOB, L leg numbness better, mild weakness says it is old ?   Assessment  & Plan :     Acute onset left lower extremity weakness/numbness - he has history of chronic low back pain, states he has mild weakness in the left lower extremity which is old, due to his AICD MRI of the brain and spine are awaiting further testing to clear him.  CT scan of the brain was unremarkable.  Neuro has been consulted.  Symptoms are better.  [place] dual antiplatelet therapy and statin.  PT - OT.  2.  Recently discovered right lower lobe lung nodule.  Follows with Dr. Lamonte Sakai.  3.  Hypertension.  Permissive hypertension telemetry we rule out stroke.  4.  Anemia.  On statin.  Lipid panel pending from this admission.  5.  CAD with chronic ischemic cardiomyopathy.  Underlying chronic  systolic heart failure EF 20 to 25%.  Has biventricular AICD.  His Coreg, Entresto and Aldactone currently on hold due to #1 above, continue statin and DAPT for secondary prevention.  No acute issues from cardiac standpoint.  Currently appears compensated from CHF standpoint.  6.  Ongoing smoking.  NicoDerm patch.  Counseled to quit.   Lab Results  Component Value Date   CHOL 93 (L) 02/12/2021   HDL 33 (L) 02/12/2021   LDLCALC 48 02/12/2021   TRIG 44 02/12/2021   CHOLHDL 2.8 02/12/2021   Lab Results  Component Value Date   HGBA1C 5.7 (H) 03/29/2018          Condition - Fair  Family Communication  :  None present  Code Status :  Full  Consults  :  Neuro  PUD Prophylaxis :    Procedures  :     CT Brain  - Non Acute  MRI Brain and L Spine -       Disposition Plan  :    Status is: Observation  Dispo: The patient is from: Home              Anticipated d/c is to: Home              Patient currently is not medically stable to d/c.   Difficult to place patient No   DVT Prophylaxis  :  Heparin added  SCDs Start: 08/31/21 2000    Lab Results  Component Value Date   PLT 210 08/31/2021    Diet :  Diet Order             Diet NPO time specified  Diet effective midnight                    Inpatient Medications  Scheduled Meds:  aspirin EC  81 mg Oral Daily   atorvastatin  40 mg Oral Daily   clopidogrel  75 mg Oral Daily   nicotine  7 mg Transdermal Daily   Continuous Infusions: PRN Meds:.acetaminophen **OR** acetaminophen  Antibiotics  :    Anti-infectives (From admission, onward)    None        Time Spent in minutes  30   Lala Lund M.D on 09/01/2021 at 8:42 AM  To page go to [email or web address]   [facility] -  Office  [phone]    See all Orders from today for further details    Objective:   Vitals:   09/01/21 0500 09/01/21 0530 09/01/21 0600 09/01/21 0730  BP: (!) 120/46 (!) 115/46 (!) 137/52 (!) 140/47  Pulse:  61 60 (!) 59 60  Resp: 18   15  Temp:      TempSrc:      SpO2: 93% 96% 93% 96%  Weight:      Height:        Wt Readings from Last 3 Encounters:  08/31/21 70 kg  08/20/21 68.9 kg  02/12/21 69.4 kg    No intake or output data in the 24 hours ending 09/01/21 0842   Physical Exam  Awake Alert, No new F.N deficits, L Leg L5-S1 numbness, mild L.Leg weakness 4/5, says that is old  [place].AT,PERRAL Supple Neck,No JVD, No cervical lymphadenopathy appriciated.  Symmetrical Chest wall movement, Good air movement bilaterally, CTAB RRR,No Gallops,Rubs or new Murmurs, No Parasternal Heave +ve B.Sounds, Abd Soft, No tenderness, No organomegaly appriciated, No rebound - guarding or rigidity. No Cyanosis, Clubbing or edema,    Data Review:    CBC Recent Labs  Lab 08/31/21 1245 08/31/21 1251  WBC 11.0*  --   HGB 13.7 13.9  HCT 40.4 41.0  PLT 210  --   MCV 91.6  --   MCH 31.1  --   MCHC 33.9  --  RDW 12.8  --   LYMPHSABS 2.0  --   MONOABS 0.8  --   EOSABS 0.2  --   BASOSABS 0.0  --     Recent Labs  Lab 08/31/21 1245 08/31/21 1251  NA 140 142  K 4.6 4.5  CL 107 110  CO2 25  --   GLUCOSE 159* 153*  BUN 11 13  CREATININE 1.09 1.10  CALCIUM 9.3  --   AST 25  --   ALT 21  --   ALKPHOS 81  --   BILITOT 0.7  --   ALBUMIN 3.7  --   INR 1.1  --     ------------------------------------------------------------------------------------------------------------------ No results for input(s): CHOL, HDL, LDLCALC, TRIG, CHOLHDL, LDLDIRECT in the last 72 hours.  Lab Results  Component Value Date   HGBA1C 5.7 (H) 03/29/2018   ------------------------------------------------------------------------------------------------------------------ No results for input(s): TSH, T4TOTAL, T3FREE, THYROIDAB in the last 72 hours.  Invalid input(s): FREET3  Cardiac Enzymes No results for input(s): CKMB, TROPONINI, MYOGLOBIN in the last 168 hours.  Invalid input(s):  CK ------------------------------------------------------------------------------------------------------------------    Component Value Date/Time   BNP 286.2 (H) 03/29/2018 1620     Radiology Reports CUP PACEART REMOTE DEVICE CHECK  Result Date: 08/19/2021 Scheduled remote reviewed. Normal device function.  Next remote 91 days. Kathy Breach, RN, CCDS, [employer]  CT HEAD CODE STROKE WO CONTRAST  Result Date: 08/31/2021 CLINICAL DATA:  Code stroke. Neuro deficit, acute, stroke suspected. Left leg weakness. EXAM: CT HEAD WITHOUT CONTRAST TECHNIQUE: Contiguous axial images were obtained from the base of the skull through the vertex without intravenous contrast. COMPARISON:  None. FINDINGS: Brain: There is no evidence of an acute infarct, intracranial hemorrhage, mass, midline shift, or extra-axial fluid collection. The ventricles and sulci are normal. Vascular: Calcified atherosclerosis at the skull base. No hyperdense vessel. Skull: No fracture suspicious osseous lesion. Sinuses/Orbits: Mild mucosal thickening in the paranasal sinuses. Clear mastoid air cells. Unremarkable orbits. Other: None. ASPECTS [facility] Stroke Program Early CT Score) - Ganglionic level infarction (caudate, lentiform nuclei, internal capsule, insula, M1-M3 cortex): 7 - Supraganglionic infarction (M4-M6 cortex): 3 Total score (0-10 with 10 being normal): 10 IMPRESSION: Unremarkable CT appearance of the brain. ASPECTS of 10. These results were communicated to Dr. Theda Sers at 1:02 pm on 08/31/2021 by text page via the [facility] messaging system. Electronically Signed   By: Logan Bores M.D.   On: 08/31/2021 13:03

## 2021-09-01 NOTE — ED Notes (Signed)
Pt refused plavix and other meds ordered for him at this time. Pt states he has not taken blood thinners in the past. Explained to pt that since he was a code stroke, he is ordered those medications. Pt also is frustrated regarding being NPO. Pt is NPO until MRI which is at 1300 per MRI. Needs clearance for AICD which is at 1300. Pt is also asking for his home meds specifically Entresto. Pt states he will needs food for his meds. No current orders at this time. Dr.Singh paged at this time and sent a message in epic.

## 2021-09-01 NOTE — ED Notes (Signed)
Explained to pt that Dr.Singh is not going to order his entresto until he is MRI cleared due to stroke symptoms. Pt is very upset at this time. Breakfast tray ordered. Currently talking to his daughter. Will continue to monitor.

## 2021-09-02 ENCOUNTER — Other Ambulatory Visit: Payer: Self-pay

## 2021-09-02 ENCOUNTER — Ambulatory Visit (HOSPITAL_COMMUNITY)
Admission: RE | Admit: 2021-09-02 | Discharge: 2021-09-02 | Disposition: A | Discharge status: Home / Self Care | Payer: No Typology Code available for payment source | Source: Ambulatory Visit | Attending: Physician Assistant | Admitting: Physician Assistant

## 2021-09-02 DIAGNOSIS — R911 Solitary pulmonary nodule: Secondary | ICD-10-CM | POA: Insufficient documentation

## 2021-09-02 LAB — GLUCOSE, CAPILLARY: Glucose-Capillary: 105 mg/dL — ABNORMAL HIGH (ref 70–99)

## 2021-09-02 IMAGING — CT NM PET TUM IMG INITIAL (PI) SKULL BASE T - THIGH
7 series · 25 of 25 positions shown · non-contrast
Comparison: Chest radiograph of [DATE].

CLINICAL DATA: Initial treatment strategy for right lower lobe
pulmonary nodule.

EXAM:
NUCLEAR MEDICINE PET SKULL BASE TO THIGH
TECHNIQUE: 7.7 mCi F-18 FDG was injected intravenously. Full-ring PET imaging
was performed from the skull base to thigh after the radiotracer. CT
data was obtained and used for attenuation correction and anatomic
localization.
Fasting blood glucose: 105 mg/dl

[Series 3: pet sk_thigh ac · axial · 5.0mm · 4.07mm/px · z∈[-551,+385]mm · 6 of 235 slices shown]
[im 1/235]
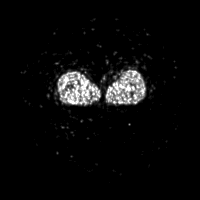
[im 47/235]
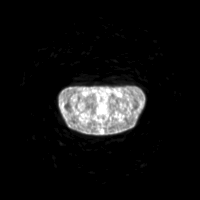
[im 94/235]
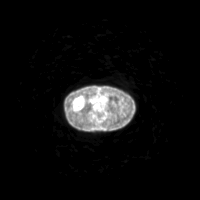
[im 141/235]
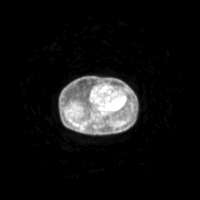
[im 188/235]
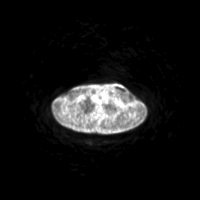
[im 235/235]
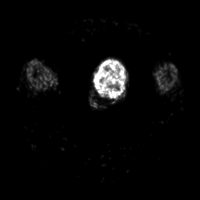

[Series 4: ct sk_thigh 5.0 bf37 · axial · 5.0mm · 0.98mm/px · z∈[-551,+385]mm · 5 of 235 slices shown]
[im 1/235]
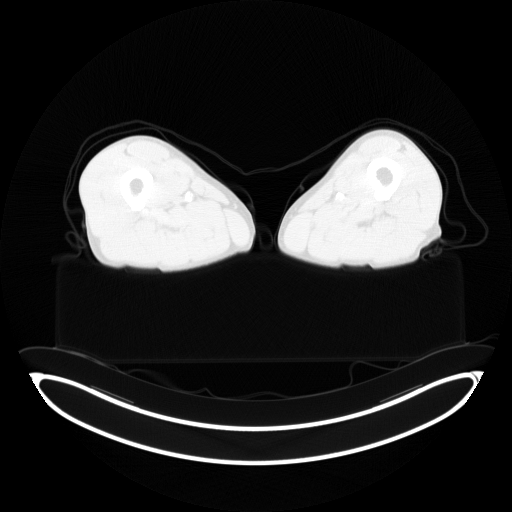
[im 59/235]
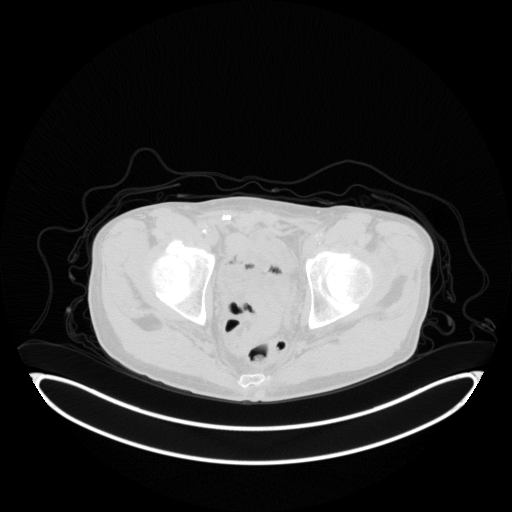
[im 118/235]
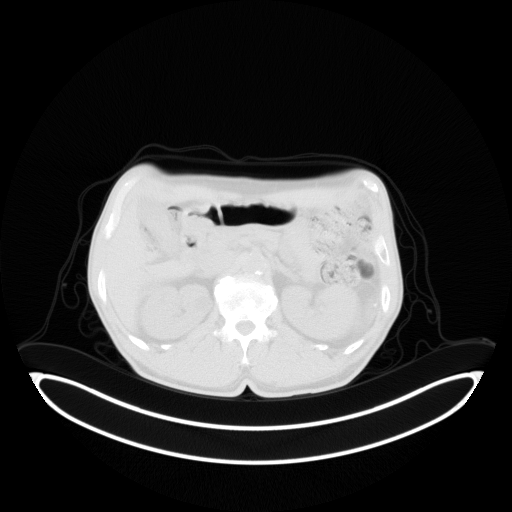
[im 176/235]
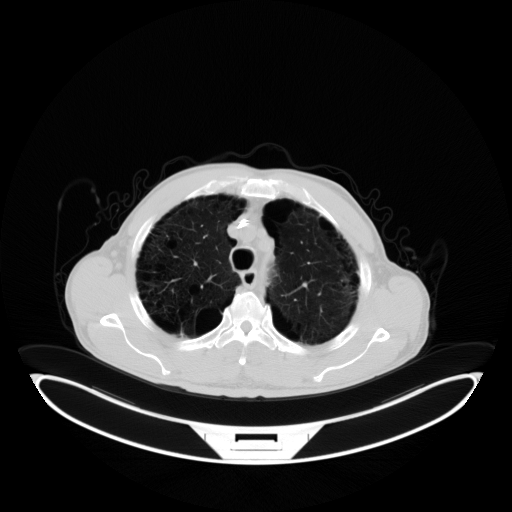
[im 235/235  brain]
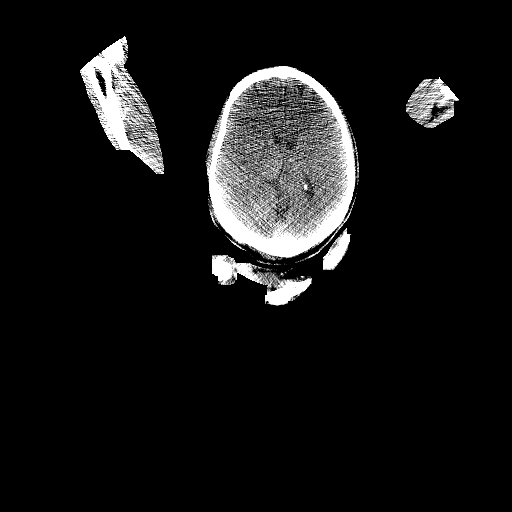

[Series 5: pet sk_thigh nac · axial · 5.0mm · 4.07mm/px · z∈[-551,+385]mm · 5 of 235 slices shown]
[im 1/235]
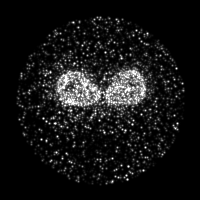
[im 59/235]
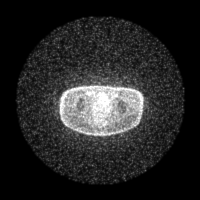
[im 118/235]
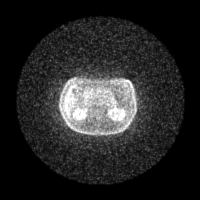
[im 176/235]
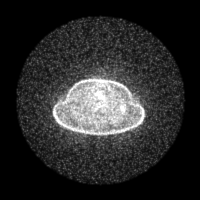
[im 235/235]
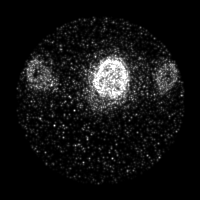

[Series 8: ct sk_thigh 5.0 br59 lung_bone · axial · 5.0mm · 0.68mm/px · z∈[-42,+234]mm · 2 of 70 slices shown]
[im 1/70]
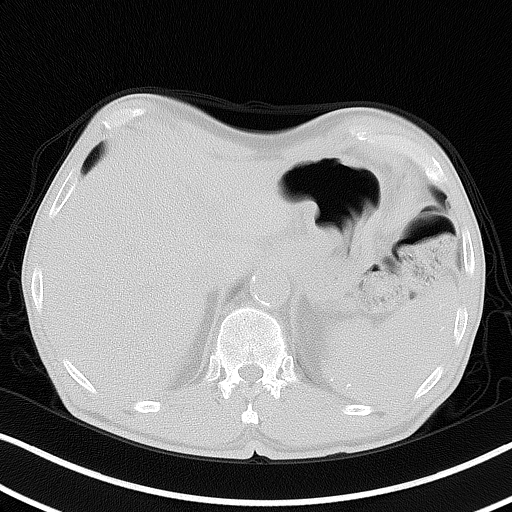
[im 70/70]
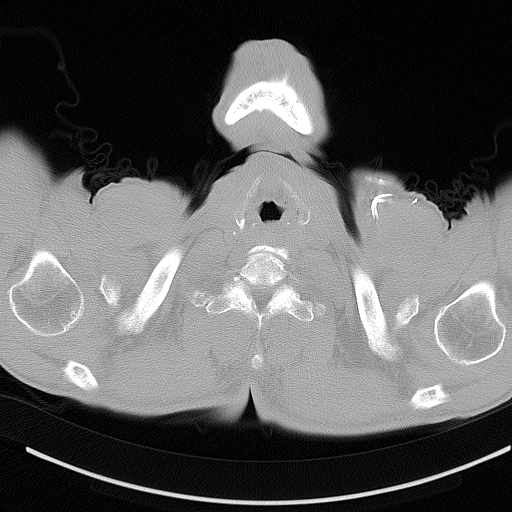

[Series 603: <mip collection> · coronal · 1.94mm/px · 1 of 32 slices shown]
[im 1/32]
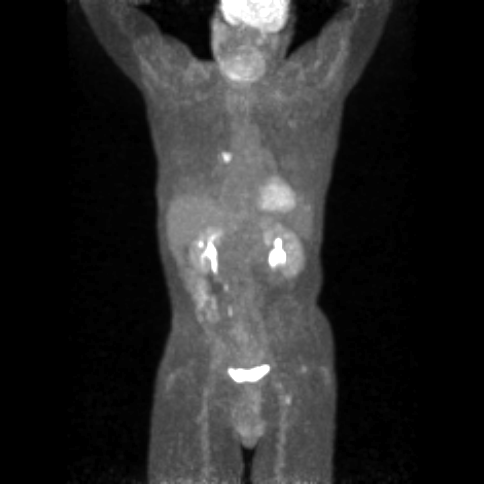

[Series 604: fused cor · 1 of 38 slices shown]
[im 1/38]
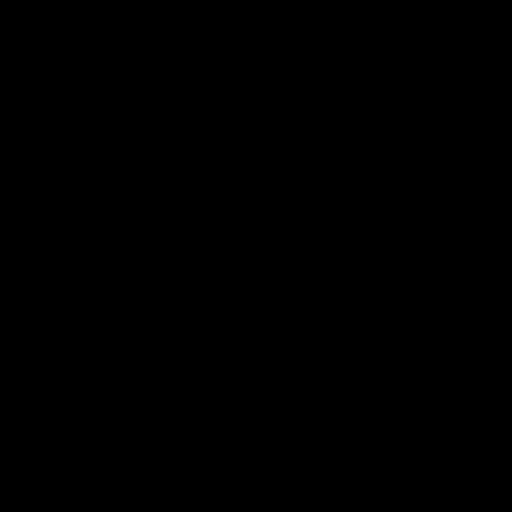

[Series 605: range-ct sk_thigh 5.0 bf37-tra-<alpha range> · 5 of 224 slices shown]
[im 1/224]
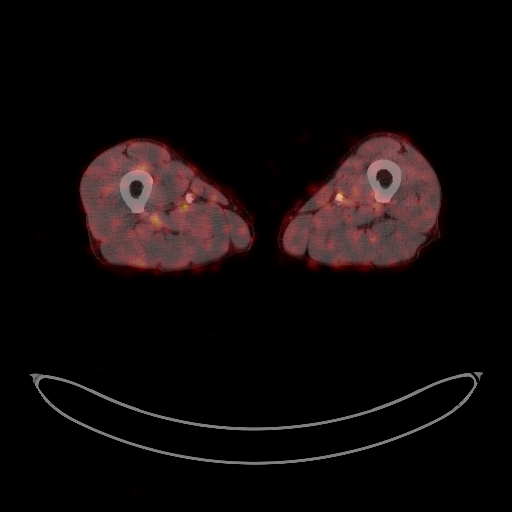
[im 56/224]
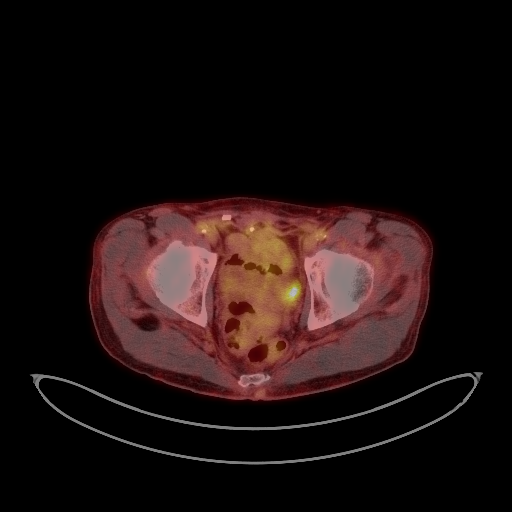
[im 112/224]
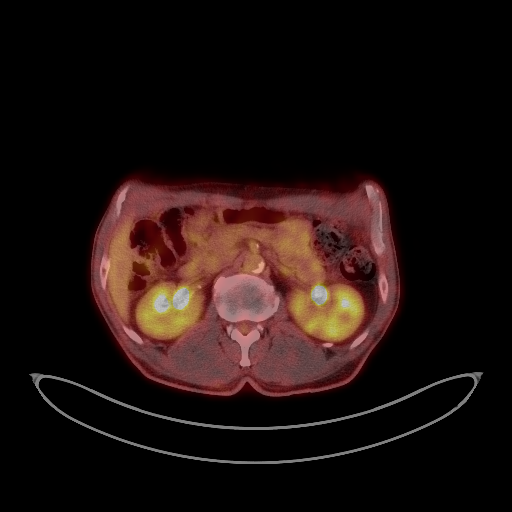
[im 168/224]
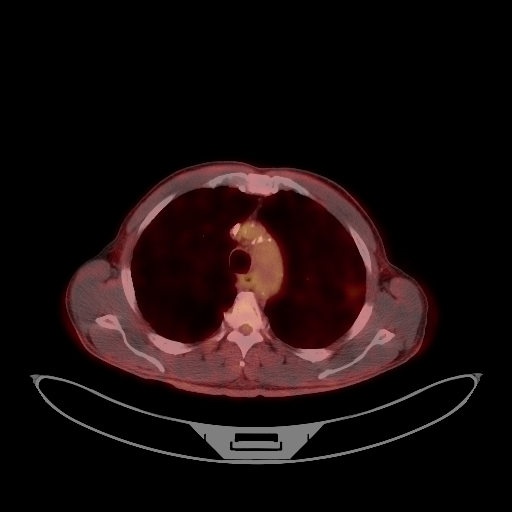
[im 224/224]
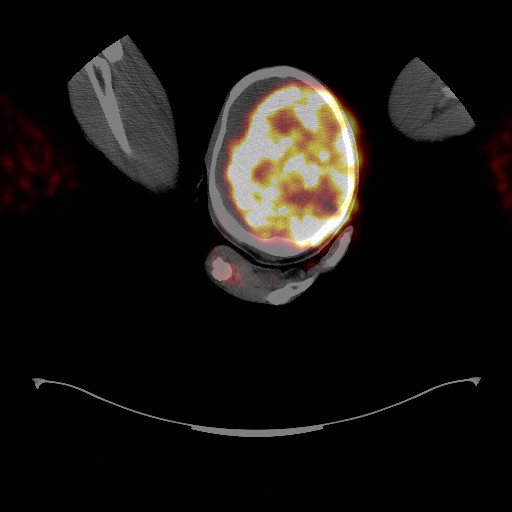

[25 of 25 positions shown; findings below may reference images not displayed]

FINDINGS: Mediastinal blood pool activity: SUV max

Liver activity: SUV max NA

NECK: No areas of abnormal hypermetabolism.

Incidental CT findings: No cervical adenopathy. Left maxillary sinus
mucous retention cyst or polyp.

CHEST: A focus of mild left upper lobe hypermetabolism may
correspond to vague ground-glass on 59/4. Example at a S.U.V. max of
2.4.

Right lower lobe pleural based nodule measures 2.0 cm and a S.U.V.
max of 9.6 on 38/8.

No thoracic nodal hypermetabolism.

Incidental CT findings: Pacer. Aortic and coronary artery
calcification. Mild cardiomegaly. Moderate centrilobular and
paraseptal emphysema.

ABDOMEN/PELVIS: No abdominopelvic parenchymal or nodal
hypermetabolism.

Incidental CT findings: Normal adrenal glands. Low-density bilateral
renal lesions on the order of 1 cm are likely cysts. Colonic stool
burden suggests constipation. Abdominal aortic atherosclerosis.
Right inguinal hernia repair.

SKELETON: Hypermetabolism at the left hamstring origin is likely
degenerative at a S.U.V. max of 5.1. No osseous hypermetabolism.

Incidental CT findings: none
IMPRESSION: 1. Hypermetabolic right lower lobe pulmonary nodule, most consistent
with primary bronchogenic carcinoma. Presuming non-small-cell
histology, [F2] or stage IA.
2. Left upper lobe hypermetabolism may correspond to vague
ground-glass and could represent a focus of infection or
inflammation.
3. Aortic atherosclerosis ([F2]-[F2]), coronary artery
atherosclerosis and emphysema ([F2]-[F2]).

## 2021-09-02 MED ORDER — FLUDEOXYGLUCOSE F - 18 (FDG) INJECTION
8.8000 | Freq: Once | INTRAVENOUS | Status: AC
  Administered 2021-09-02: 7.67 via INTRAVENOUS

## 2021-09-04 ENCOUNTER — Other Ambulatory Visit: Payer: Self-pay

## 2021-09-04 ENCOUNTER — Encounter: Payer: Self-pay | Admitting: Thoracic Surgery (Cardiothoracic Vascular Surgery)

## 2021-09-04 ENCOUNTER — Institutional Professional Consult (permissible substitution) (INDEPENDENT_AMBULATORY_CARE_PROVIDER_SITE_OTHER): Payer: No Typology Code available for payment source | Admitting: Thoracic Surgery (Cardiothoracic Vascular Surgery)

## 2021-09-04 VITALS — BP 111/56 | HR 69 | Resp 20 | Ht 67.0 in | Wt 154.0 lb

## 2021-09-04 DIAGNOSIS — R911 Solitary pulmonary nodule: Secondary | ICD-10-CM

## 2021-09-04 NOTE — Progress Notes (Signed)
PCP is [facility] Referring Provider is Fredirick Lathe, PA-C  Chief Complaint  Patient presents with   Lung Lesion    Surgical consult, Chest CT 01/29/21 and 07/30/21- [facility]    HPI: Mr. Valente is sent for consultation regarding a right lower lobe lung nodule.  Jantzen Pilger is a 70 year old man with a past medical history significant for tobacco abuse, myocardial infarction, acute on chronic systolic heart failure, BiV ICD insertion, hypertension, hyperlipidemia, chronic back pain, and newly diagnosed emphysema.  He smoked about 1-2 packs a day for around 45 years.  Recently he has cut back to about 1 cigarette a day.  He had a low-dose screening CT for lung cancer in February.  It showed a right lower lobe lung nodule.  He had a follow-up scan in August which showed the nodule had increased in size.  He was referred to Dr. Lamonte Sakai.  Dr. Lamonte Sakai did a PET/CT which showed the nodule is hypermetabolic.  There was a groundglass opacity with some metabolic activity in the left upper lobe this was felt to likely be infectious or inflammatory.  There is no evidence of regional or distant metastatic disease.  He has good exercise tolerance.  He says he can walk until he gets tired of it.  He does not have any chest pain, pressure, tightness, or shortness of breath with normal exertion.  He can walk up a flight of stairs.  He denies any swelling in his legs.  He does have a BiV pacemaker for heart failure.  His last echo was in 2020 and showed an ejection fraction of 25%.  Past Medical History:  Diagnosis Date   Acute renal insufficiency    Anginal pain (HCC)    Chronic lower back pain    "due to 3 ruptured discs in my back" (11/08/2018)   High cholesterol    History of kidney stones 1982   "passed them"   Hypertension    Myocardial infarction ([place]) 89/3810   Systolic heart failure ([place])    Tobacco use     Past Surgical History:  Procedure Laterality Date   BIV ICD INSERTION CRT-D N/A  11/08/2018    [employer] biventricular ICD implantation by Dr Rayann Heman with His Bundle pacing and a MDT 1751 lead placed into the LV port.    CARDIAC CATHETERIZATION     INGUINAL HERNIA REPAIR Bilateral    KNEE SURGERY Left 1990s   "growth on the back of my knee removed"   LEFT HEART CATH AND CORONARY ANGIOGRAPHY N/A 03/30/2018   Procedure: LEFT HEART CATH AND CORONARY ANGIOGRAPHY;  Surgeon: Burnell Blanks, MD;  Location: [facility];  Service: Cardiovascular;  Laterality: N/A;    Family History  Problem Relation Age of Onset   Leukemia Mother    Cancer Father    Cancer Sister    Cancer Brother    CAD Brother     Social History Social History   Tobacco Use   Smoking status: Some Days    Packs/day: 0.14    Years: 47.00    Pack years: 6.58    Types: Cigarettes   Smokeless tobacco: Never   Tobacco comments:    3 cigarettes smoked daily  ARJ 08/20/21  Vaping Use   Vaping Use: Never used  Substance Use Topics   Alcohol use: Not Currently    Comment: 11/08/2018 "stopped 09/23/1998"   Drug use: Not Currently    Comment: 11/08/2018 "nothing in the 2000s"  Current Outpatient Medications  Medication Sig Dispense Refill   aspirin 81 MG EC tablet Take 1 tablet (81 mg total) by mouth daily. 30 tablet 6   atorvastatin (LIPITOR) 40 MG tablet TAKE 1 TABLET BY MOUTH ONCE DAILY . APPOINTMENT REQUIRED FOR FUTURE REFILLS BEFORE  MARCH  2022 (Patient taking differently: Take 40 mg by mouth daily.) 15 tablet 0   carvedilol (COREG) 6.25 MG tablet Take 1 tablet (6.25 mg total) by mouth 2 (two) times daily with a meal. (Patient taking differently: Take 6.25 mg by mouth daily.) 180 tablet 3   Multiple Vitamin (MULTIVITAMIN WITH MINERALS) TABS tablet Take 1 tablet by mouth daily.     sacubitril-valsartan (ENTRESTO) 24-26 MG Take 1 tablet by mouth 2 (two) times daily. Please make overdue appt with Dr. Meda Coffee before anymore refills. 1st attempt 60 tablet 0   spironolactone (ALDACTONE)  25 MG tablet Take 0.5 tablets (12.5 mg total) by mouth daily. 15 tablet 5   No current facility-administered medications for this visit.    No Known Allergies  Review of Systems  Constitutional:  Negative for activity change, fatigue and unexpected weight change.  HENT:  Negative for trouble swallowing and voice change.   Respiratory:  Positive for shortness of breath (With heavy exertion). Negative for cough and wheezing.   Cardiovascular:  Negative for chest pain, palpitations and leg swelling.  Genitourinary:  Negative for difficulty urinating and dysuria.  Musculoskeletal:  Positive for arthralgias and back pain.  Neurological:  Negative for syncope and weakness.  Hematological:  Negative for adenopathy. Does not bruise/bleed easily.  All other systems reviewed and are negative.  BP (!) 111/56   Pulse 69   Resp 20   Ht 5\' 7"  (1.702 m)   Wt 154 lb (69.9 kg)   SpO2 95% Comment: RA  BMI 24.12 kg/m  Physical Exam Vitals reviewed.  Constitutional:      General: He is not in acute distress.    Appearance: Normal appearance.  HENT:     Head: Normocephalic and atraumatic.  Eyes:     General: No scleral icterus.    Extraocular Movements: Extraocular movements intact.  Neck:     Vascular: Carotid bruit (Left carotid bruit) present.  Cardiovascular:     Rate and Rhythm: Normal rate and regular rhythm.     Heart sounds: Normal heart sounds. No murmur heard. Pulmonary:     Effort: Pulmonary effort is normal. No respiratory distress.     Breath sounds: Normal breath sounds. No wheezing or rales.  Abdominal:     General: There is no distension.     Palpations: Abdomen is soft.     Tenderness: There is no abdominal tenderness.  Musculoskeletal:     Cervical back: Neck supple.  Skin:    General: Skin is warm and dry.  Neurological:     General: No focal deficit present.     Mental Status: He is alert and oriented to person, place, and time.     Cranial Nerves: No cranial  nerve deficit.     Motor: No weakness.    Diagnostic Tests: NUCLEAR MEDICINE PET SKULL BASE TO THIGH   TECHNIQUE: 7.7 mCi F-18 FDG was injected intravenously. Full-ring PET imaging was performed from the skull base to thigh after the radiotracer. CT data was obtained and used for attenuation correction and anatomic localization.   Fasting blood glucose: 105 mg/dl   COMPARISON:  Chest radiograph of 11/09/2018.   FINDINGS: Mediastinal blood pool activity: SUV max  2.2   Liver activity: SUV max NA   NECK: No areas of abnormal hypermetabolism.   Incidental CT findings: No cervical adenopathy. Left maxillary sinus mucous retention cyst or polyp.   CHEST: A focus of mild left upper lobe hypermetabolism may correspond to vague ground-glass on 59/4. Example at a S.U.V. max of 2.4.   Right lower lobe pleural based nodule measures 2.0 cm and a S.U.V. max of 9.6 on 38/8.   No thoracic nodal hypermetabolism.   Incidental CT findings: Pacer. Aortic and coronary artery calcification. Mild cardiomegaly. Moderate centrilobular and paraseptal emphysema.   ABDOMEN/PELVIS: No abdominopelvic parenchymal or nodal hypermetabolism.   Incidental CT findings: Normal adrenal glands. Low-density bilateral renal lesions on the order of 1 cm are likely cysts. Colonic stool burden suggests constipation. Abdominal aortic atherosclerosis. Right inguinal hernia repair.   SKELETON: Hypermetabolism at the left hamstring origin is likely degenerative at a S.U.V. max of 5.1. No osseous hypermetabolism.   Incidental CT findings: none   IMPRESSION: 1. 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 2. Left upper lobe hypermetabolism may correspond to vague ground-glass and could represent a focus of infection or inflammation. 3. Aortic atherosclerosis (ICD10-I70.0), coronary  artery atherosclerosis and emphysema (ICD10-J43.9).     Electronically Signed   By: Abigail Miyamoto M.D.   On: 09/03/2021 08:57 I personally reviewed his CT and PET/CT images.  There is a 2 cm right lower lobe mass that is hypermetabolic.  There is a groundglass opacity with some mild activity in the left upper lobe.  There is evidence of aortic and coronary atherosclerosis.  Pulmonary function testing 08/26/2021 FVC 2.82 (86%) FEV1 1.94 (79%), FEV1 1.71 (70%) postbronchodilator TLC 5.82 (93%) DLCO 11.88 (51%)  Impression: Lavoris Sparling is a 70 year old man with a history of tobacco abuse, myocardial infarction, acute on chronic systolic heart failure, BiV ICD insertion, hypertension, hyperlipidemia, chronic back pain, and newly diagnosed emphysema.  He was found to have a right lower lobe lung nodule on a low-dose screening CT in February.  On a follow-up CT at 6 months the nodule had increased in size.  On PET the nodule is markedly hypermetabolic.  Right lower lobe lung nodule-given his age, smoking history, and appearance of the nodule on imaging this almost certainly is a new primary bronchogenic carcinoma.  Infectious and inflammatory nodules are in the differential diagnosis but this has to be considered a cancer unless it can be proven otherwise.  I discussed the possibility of surgical resection versus stereotactic radiation with Mr. Zurcher.  We discussed the relative advantages and disadvantages of each of those options.  He strongly favor surgical resection even with the understanding that it would be a high risk procedure.  I discussed the general nature of the procedure with Mr. Mineau.  We would attempt to do this robotically, although there is always a possibility we might have to convert to an open procedure.  The plan will be to do a wedge resection and then proceed with lobectomy if the frozen section is positive for cancer.  I informed him of the need for general anesthesia, the  incisions to be used, the use of drainage tubes postoperatively, the expected hospital stay, and the overall recovery.  I informed him of the indications, risks, benefits, and alternatives.  He understands the risks include, but not limited to death, MI, DVT, PE, bleeding, possible need for transfusion, infection, prolonged air leak, cardiac arrhythmias,  as well as possibility of other unforeseeable complications.  He is willing to accept the risks and wishes to proceed.  Given his cardiac history he needs formal cardiology clearance.  I think he at least needs another echocardiogram if not some type of stress testing prior to surgery.  Will refer back to Dr. Rayann Heman.  Pulmonary function testing shows adequate pulmonary reserve to tolerate a lobectomy.  Plan: He needs formal cardiology clearance prior to surgical resection. Once cleared by cardiology we will plan robotic right VATS for right lower lobe wedge resection and probable right lower lobectomy  Melrose Nakayama, MD [facility] [phone]

## 2021-09-09 NOTE — Progress Notes (Signed)
Radiation Oncology         ([phone]) [phone] ________________________________  Initial Outpatient Consultation  Name: Juan Norris MRN: [id number]  Date: 09/10/2021  DOB: [birth year]  SW:[id number], [facility] [place]   REFERRING PHYSICIAN: [facility], [place]  DIAGNOSIS: The encounter diagnosis was Pulmonary nodule.  Hypermetabolic right lower lobe pulmonary nodule, clinical stage IA (T1b, N0, M0)  HISTORY OF PRESENT ILLNESS::Juan Norris is a 70 y.o. male active smoker (34 pack years, has cut down to 2 cig a day) who is accompanied by no one. he is seen as a courtesy of Dr. Lamonte Sakai for an opinion concerning radiation therapy as part of management for his recently diagnosed RLL nodule. The patient presented to the [place] for lung cancer screening CT on 07/30/21 which demonstrated a 2.2 x 1.2 solid superior right lower lobe nodule which increased in size from 7 mm on his previous scan from 01/29/2021. Nodule was noted to appear very suspicious for primary lung cancer based on interval increase in size over 6 months.  Accordingly, the patient was referred to Dr. Lamonte Sakai on 08/20/21 for further evaluation. During this visit, Dr. Lamonte Sakai recommended pulmonary function testing to determine if the patient is a good candidate for resectioning. If he has marginal lung function, then Dr. Lamonte Sakai would plan for navigational bronchoscopy to get a tissue diagnosis and hopefully facilitate SBRT.  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 Also seen was a left upper lobe hypermetabolism; noted to possible correspond with vague ground-glass.   Following these findings, the patient was referred to Dr. Roxan Hockey for  discussion of surgical interventions on 09/04/21. The patient expressed interest in pursuing surgical resection even with the understanding that it would be a high risk procedure.  Of note; the patient presented to the [facility] ED on 08/31/21 as a code stroke from triage with concern for altered sensation in the left lower leg which started that same morning. Patient states he was sitting in church when he suddenly felt a shooting pain in his left lower leg subsequently followed by complete numbness of the leg and sensation of weakness, and inability to walk on it. Patient also stated that he has a history of surgery behind the left knee to remove a mass in the past and has experienced the symptoms before, however not as severe as they were upon presentation to the ED. 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 The patient additionally reported facial twitching which prompted MRI of the brain and lumbar spine. Findings from both MRIs revealed no acute abnormalities and the patient was discharged that same day.    PREVIOUS RADIATION THERAPY: No  PAST MEDICAL HISTORY:  Past Medical History:  Diagnosis Date   Acute renal insufficiency    Anginal pain (HCC)    Chronic lower back pain    "due to 3 ruptured discs in my back" (11/08/2018)   High cholesterol    History of kidney stones 1982   "passed them"   Hypertension    Myocardial infarction ([place]) 03/2018  Systolic heart failure (HCC)    Tobacco use     PAST SURGICAL HISTORY: Past Surgical History:  Procedure Laterality Date   BIV ICD INSERTION CRT-D N/A 11/08/2018    Medtronic biventricular ICD implantation by Dr Rayann Heman with His Bundle pacing and a MDT 6761 lead placed into the LV port.    CARDIAC  CATHETERIZATION     INGUINAL HERNIA REPAIR Bilateral    KNEE SURGERY Left 1990s   "growth on the back of my knee removed"   LEFT HEART CATH AND CORONARY ANGIOGRAPHY N/A 03/30/2018   Procedure: LEFT HEART CATH AND CORONARY ANGIOGRAPHY;  Surgeon: Burnell Blanks, MD;  Location: [place] [facility];  Service: Cardiovascular;  Laterality: N/A;    FAMILY HISTORY:  Family History  Problem Relation Age of Onset   Leukemia Mother    Cancer Father    Cancer Sister    Cancer Brother    CAD Brother     SOCIAL HISTORY:  Social History   Tobacco Use   Smoking status: Some Days    Packs/day: 1.50    Years: 47.00    Pack years: 70.50    Types: Cigarettes   Smokeless tobacco: Never   Tobacco comments:    3 cigarettes smoked daily  ARJ 08/20/21  Vaping Use   Vaping Use: Never used  Substance Use Topics   Alcohol use: Not Currently    Comment: 11/08/2018 "stopped 09/23/1998"   Drug use: Not Currently    Comment: 11/08/2018 "nothing in the 2000s"    ALLERGIES: No Known Allergies  MEDICATIONS:  Current Outpatient Medications  Medication Sig Dispense Refill   aspirin 81 MG EC tablet Take 1 tablet (81 mg total) by mouth daily. 30 tablet 6   atorvastatin (LIPITOR) 40 MG tablet TAKE 1 TABLET BY MOUTH ONCE DAILY . APPOINTMENT REQUIRED FOR FUTURE REFILLS BEFORE  MARCH  2022 (Patient taking differently: Take 40 mg by mouth daily.) 15 tablet 0   carvedilol (COREG) 6.25 MG tablet Take 1 tablet (6.25 mg total) by mouth 2 (two) times daily with a meal. (Patient taking differently: Take 6.25 mg by mouth daily.) 180 tablet 3   Multiple Vitamin (MULTIVITAMIN WITH MINERALS) TABS tablet Take 1 tablet by mouth daily.     sacubitril-valsartan (ENTRESTO) 24-26 MG Take 1 tablet by mouth 2 (two) times daily. Please make overdue appt with Dr. Meda Coffee before anymore refills. 1st attempt 60 tablet 0   spironolactone (ALDACTONE) 25 MG tablet Take 0.5 tablets (12.5 mg total) by mouth daily. 15 tablet 5   No  current facility-administered medications for this encounter.    REVIEW OF SYSTEMS:  A 10+ POINT REVIEW OF SYSTEMS WAS OBTAINED including neurology, dermatology, psychiatry, cardiac, respiratory, lymph, extremities, GI, GU, musculoskeletal, constitutional, reproductive, HEENT.  Patient reports that he is cut down his smoking pack of cigarettes approximately every 5 to 6 days.  He is wishing to have on nicotine patch so that he can completely stop and will discuss this with Dr. Lamonte Sakai.  The patient denies any pain within the chest area significant cough or hemoptysis.  He denies any headaches or blurred vision.  He reports chronic weakness in his left lower extremity related to his knee surgery.  He is able to walk however without difficulty.   PHYSICAL EXAM:  height is 5\' 7"  (1.702 m) and weight is 157 lb 12.8 oz (71.6 kg). His temperature is 97.8 F (36.6 C). His blood pressure is 126/50 (abnormal) and his pulse is 59 (abnormal).  His respiration is 20 and oxygen saturation is 99%.   General: Alert and oriented, in no acute distress, has somewhat of appearance of mild Bell's palsy with left eye symptoms, hard of hearing. HEENT: Head is normocephalic. Extraocular movements are intact. Oropharynx reveals very poor dentition. Neck: Neck is supple, no palpable cervical or supraclavicular lymphadenopathy. Heart: Regular in rate and rhythm with no murmurs, rubs, or gallops. Chest: Clear to auscultation bilaterally, with no rhonchi, wheezes, or rales. Abdomen: Soft, nontender, nondistended, with no rigidity or guarding. Extremities: No cyanosis or edema. Lymphatics: see Neck Exam Skin: No concerning lesions. Musculoskeletal: Left lower extremity reveals weakness in the proximal and distal muscle groups. Neurologic: Cranial nerves II through XII are grossly intact. No obvious focalities. Speech is fluent. Coordination is intact.  Patient constantly is closing his left eye with some mild left eyelid  dysfunction. Psychiatric: Judgment and insight are intact. Affect is appropriate.   ECOG = 1  0 - Asymptomatic (Fully active, able to carry on all predisease activities without restriction)  1 - Symptomatic but completely ambulatory (Restricted in physically strenuous activity but ambulatory and able to carry out work of a light or sedentary nature. For example, light housework, office work)  2 - Symptomatic, <50% in bed during the day (Ambulatory and capable of all self care but unable to carry out any work activities. Up and about more than 50% of waking hours)  3 - Symptomatic, >50% in bed, but not bedbound (Capable of only limited self-care, confined to bed or chair 50% or more of waking hours)  4 - Bedbound (Completely disabled. Cannot carry on any self-care. Totally confined to bed or chair)  5 - Death   Eustace Pen MM, Creech RH, Tormey DC, et al. [phone]). "Toxicity and response criteria of the [facility]". [place] Oncol. 5 (6): 649-55  LABORATORY DATA:  Lab Results  Component Value Date   WBC 11.0 (H) 08/31/2021   HGB 13.9 08/31/2021   HCT 41.0 08/31/2021   MCV 91.6 08/31/2021   PLT 210 08/31/2021   NEUTROABS 8.0 (H) 08/31/2021   Lab Results  Component Value Date   NA 142 08/31/2021   K 4.5 08/31/2021   CL 110 08/31/2021   CO2 25 08/31/2021   GLUCOSE 153 (H) 08/31/2021   CREATININE 1.10 08/31/2021   CALCIUM 9.3 08/31/2021      RADIOGRAPHY: MR BRAIN W WO CONTRAST  Result Date: 09/01/2021 CLINICAL DATA:  Neuro deficit, acute, stroke suspected EXAM: MRI HEAD WITHOUT AND WITH CONTRAST TECHNIQUE: Multiplanar, multiecho pulse sequences of the brain and surrounding structures were obtained without and with intravenous contrast. CONTRAST:  53mL GADAVIST GADOBUTROL 1 MMOL/ML IV SOLN COMPARISON:  None. FINDINGS: Brain: There is no acute infarction or intracranial hemorrhage. There is no intracranial mass, mass effect, or edema. There is no hydrocephalus  or extra-axial fluid collection. Ventricles and sulci are normal in size and configuration. A few small foci of T2 hyperintensity in the supratentorial white matter are nonspecific but may reflect mild chronic microvascular ischemic changes. Small chronic bilateral cerebellar infarcts. No abnormal enhancement. Vascular: Major vessel flow voids at the skull base are preserved. Skull and upper cervical spine: Normal marrow signal is preserved. Sinuses/Orbits: Mild mucosal thickening.  Orbits are unremarkable. Other: Sella is unremarkable.  Mastoid air cells are clear. IMPRESSION: No evidence of recent infarction, hemorrhage, or mass. No abnormal enhancement. Mild chronic microvascular ischemic changes. Small chronic cerebellar infarcts. Electronically Signed   By: Addison Lank.D.  On: 09/01/2021 14:33   MR LUMBAR SPINE WO CONTRAST  Result Date: 09/01/2021 CLINICAL DATA:  Paresthesia of left leg EXAM: MRI LUMBAR SPINE WITHOUT CONTRAST TECHNIQUE: Multiplanar, multisequence MR imaging of the lumbar spine was performed. No intravenous contrast was administered. COMPARISON:  July 2022 FINDINGS: Segmentation:  Standard. Alignment:  Stable.  Trace anterolisthesis at L4-L5. Vertebrae: Stable vertebral body heights. No significant marrow edema. No suspicious osseous lesion. Conus medullaris and cauda equina: Conus extends to the L1-L2 level. Conus and cauda equina appear normal. Paraspinal and other soft tissues: Unremarkable Disc levels: L1-L2:  No canal or foraminal stenosis. L2-L3: Disc bulge. No canal stenosis. Slight effacement of subarticular recesses. Moderate foraminal stenosis. L3-L4: Disc bulge. Mild facet arthropathy. No canal stenosis. Slight effacement of subarticular recesses. Moderate foraminal stenosis, left greater than right. L4-L5: Disc bulge. Moderate facet arthropathy. No canal stenosis. Slight effacement of subarticular recesses. Moderate foraminal stenosis. L5-S1:  Mild facet arthropathy.  No  canal or foraminal stenosis. IMPRESSION: Multilevel degenerative changes as detailed above are without substantial change since the prior examination. No high-grade canal narrowing. Multilevel moderate foraminal stenosis. Electronically Signed   By: Macy Mis M.D.   On: 09/01/2021 14:45   NM PET Image Initial (PI) Skull Base To Thigh (F-18 FDG)  Result Date: 09/03/2021 CLINICAL DATA:  Initial treatment strategy for right lower lobe pulmonary nodule. EXAM: NUCLEAR MEDICINE PET SKULL BASE TO THIGH TECHNIQUE: 7.7 mCi F-18 FDG was injected intravenously. Full-ring PET imaging was performed from the skull base to thigh after the radiotracer. CT data was obtained and used for attenuation correction and anatomic localization. Fasting blood glucose: 105 mg/dl COMPARISON:  Chest radiograph of 11/09/2018. FINDINGS: Mediastinal blood pool activity: SUV max 2.2 Liver activity: SUV max NA NECK: No areas of abnormal hypermetabolism. Incidental CT findings: No cervical adenopathy. Left maxillary sinus mucous retention cyst or polyp. CHEST: A focus of mild left upper lobe hypermetabolism may correspond to vague ground-glass on 59/4. Example at a S.U.V. max of 2.4. Right lower lobe pleural based nodule measures 2.0 cm and a S.U.V. max of 9.6 on 38/8. No thoracic nodal hypermetabolism. Incidental CT findings: Pacer. Aortic and coronary artery calcification. Mild cardiomegaly. Moderate centrilobular and paraseptal emphysema. ABDOMEN/PELVIS: No abdominopelvic parenchymal or nodal hypermetabolism. Incidental CT findings: Normal adrenal glands. Low-density bilateral renal lesions on the order of 1 cm are likely cysts. Colonic stool burden suggests constipation. Abdominal aortic atherosclerosis. Right inguinal hernia repair. SKELETON: Hypermetabolism at the left hamstring origin is likely degenerative at a S.U.V. max of 5.1. No osseous hypermetabolism. Incidental CT findings: none IMPRESSION: 1. Hypermetabolic right lower lobe  pulmonary nodule, most consistent with primary bronchogenic carcinoma. Presuming non-small-cell histology, T1bN0M0 or stage IA. 2. Left upper lobe hypermetabolism may correspond to vague ground-glass and could represent a focus of infection or inflammation. 3. Aortic atherosclerosis (ICD10-I70.0), coronary artery atherosclerosis and emphysema (ICD10-J43.9). Electronically Signed   By: Abigail Miyamoto M.D.   On: 09/03/2021 08:57   CUP PACEART REMOTE DEVICE CHECK  Result Date: 08/19/2021 Scheduled remote reviewed. Normal device function.  Next remote 91 days. Kathy Breach, RN, CCDS, [employer]  CT HEAD CODE STROKE WO CONTRAST  Result Date: 08/31/2021 CLINICAL DATA:  Code stroke. Neuro deficit, acute, stroke suspected. Left leg weakness. EXAM: CT HEAD WITHOUT CONTRAST TECHNIQUE: Contiguous axial images were obtained from the base of the skull through the vertex without intravenous contrast. COMPARISON:  None. FINDINGS: Brain: There is no evidence of an acute infarct, intracranial hemorrhage, mass, midline  shift, or extra-axial fluid collection. The ventricles and sulci are normal. Vascular: Calcified atherosclerosis at the skull base. No hyperdense vessel. Skull: No fracture suspicious osseous lesion. Sinuses/Orbits: Mild mucosal thickening in the paranasal sinuses. Clear mastoid air cells. Unremarkable orbits. Other: None. ASPECTS [facility] Stroke Program Early CT Score) - Ganglionic level infarction (caudate, lentiform nuclei, internal capsule, insula, M1-M3 cortex): 7 - Supraganglionic infarction (M4-M6 cortex): 3 Total score (0-10 with 10 being normal): 10 IMPRESSION: Unremarkable CT appearance of the brain. ASPECTS of 10. These results were communicated to Dr. Theda Sers at 1:02 pm on 08/31/2021 by text page via the [facility] messaging system. Electronically Signed   By: Logan Bores M.D.   On: 08/31/2021 13:03      IMPRESSION: Hypermetabolic right lower lobe pulmonary nodule, clinical stage IA by CT and  PET findings.  Patient will meet with Dr. Lamonte Sakai October 13 to review pulmonary function studies.  If the studies are adequate for surgery then the patient will be referred to cardiothoracic surgery for evaluation.    If pulmonary function studies are suboptimal or cardiothoracic surgery does not feel he is a good surgical candidate then the patient will proceed with navigational bronchoscopy for tissue diagnosis prior to anticipated SBRT.  Today, I talked to the patient  about the findings and work-up thus far.  We discussed the natural history of non-small cell lung cancer and general treatment, highlighting the role of radiotherapy (SBRT) in the management.  We discussed the available radiation techniques, and focused on the details of logistics and delivery.  We reviewed the anticipated acute and late sequelae associated with radiation in this setting.  The patient was encouraged to ask questions that I answered to the best of my ability.  PLAN: Treatment plan pending further input from pulmonary medicine and cardiothoracic surgery.   60 minutes of total time was spent for this patient encounter, including preparation, face-to-face counseling with the patient and coordination of care, physical exam, and documentation of the encounter.   ------------------------------------------------  Blair Promise, PhD, MD  This document serves as a record of services personally performed by Gery Pray, MD. It was created on his behalf by Roney Mans, a trained medical scribe. The creation of this record is based on the scribe's personal observations and the provider's statements to them. This document has been checked and approved by the attending provider.

## 2021-09-09 NOTE — Progress Notes (Signed)
Location of tumor and Histology per Pathology Report: RLL lung Diagnostic Tests: NUCLEAR MEDICINE PET SKULL BASE TO THIGH  IMPRESSION: 1. 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  Biopsy: not performed  Past/Anticipated interventions by surgeon, if any:  a wedge resection and then proceed with lobectomy if the frozen section is positive for cancer.  Past/Anticipated interventions by medical oncology, if any: none at this time    Pain issues, if any:  yes, 7/10 chronic back pain  constant and aching  SAFETY ISSUES: Prior radiation? no Pacemaker/ICD? yes Possible current pregnancy? no Is the patient on methotrexate? no  Current Complaints / other details:  none     Vitals:   09/10/21 0818  BP: (!) 126/50  Pulse: (!) 59  Resp: 20  Temp: 97.8 F (36.6 C)  SpO2: 99%  Weight: 157 lb 12.8 oz (71.6 kg)  Height: 5\' 7"  (1.702 m)

## 2021-09-10 ENCOUNTER — Encounter: Payer: Self-pay | Admitting: Internal Medicine

## 2021-09-10 ENCOUNTER — Ambulatory Visit
Admission: RE | Admit: 2021-09-10 | Discharge: 2021-09-10 | Disposition: A | Discharge status: Home / Self Care | Payer: Medicare HMO | Source: Ambulatory Visit | Attending: Radiation Oncology | Admitting: Radiation Oncology

## 2021-09-10 ENCOUNTER — Telehealth: Payer: Self-pay | Admitting: *Deleted

## 2021-09-10 ENCOUNTER — Other Ambulatory Visit: Payer: Self-pay

## 2021-09-10 ENCOUNTER — Encounter: Payer: Self-pay | Admitting: Radiation Oncology

## 2021-09-10 VITALS — BP 126/50 | HR 59 | Temp 97.8°F | Resp 20 | Ht 67.0 in | Wt 157.8 lb

## 2021-09-10 DIAGNOSIS — M47819 Spondylosis without myelopathy or radiculopathy, site unspecified: Secondary | ICD-10-CM | POA: Insufficient documentation

## 2021-09-10 DIAGNOSIS — I110 Hypertensive heart disease with heart failure: Secondary | ICD-10-CM | POA: Diagnosis not present

## 2021-09-10 DIAGNOSIS — M4316 Spondylolisthesis, lumbar region: Secondary | ICD-10-CM | POA: Diagnosis not present

## 2021-09-10 DIAGNOSIS — I5020 Unspecified systolic (congestive) heart failure: Secondary | ICD-10-CM | POA: Diagnosis not present

## 2021-09-10 DIAGNOSIS — Z87442 Personal history of urinary calculi: Secondary | ICD-10-CM | POA: Insufficient documentation

## 2021-09-10 DIAGNOSIS — F17210 Nicotine dependence, cigarettes, uncomplicated: Secondary | ICD-10-CM | POA: Insufficient documentation

## 2021-09-10 DIAGNOSIS — R200 Anesthesia of skin: Secondary | ICD-10-CM | POA: Insufficient documentation

## 2021-09-10 DIAGNOSIS — R911 Solitary pulmonary nodule: Secondary | ICD-10-CM | POA: Diagnosis not present

## 2021-09-10 DIAGNOSIS — E7800 Pure hypercholesterolemia, unspecified: Secondary | ICD-10-CM | POA: Insufficient documentation

## 2021-09-10 DIAGNOSIS — I10 Essential (primary) hypertension: Secondary | ICD-10-CM | POA: Insufficient documentation

## 2021-09-10 DIAGNOSIS — R262 Difficulty in walking, not elsewhere classified: Secondary | ICD-10-CM | POA: Diagnosis not present

## 2021-09-10 DIAGNOSIS — M79662 Pain in left lower leg: Secondary | ICD-10-CM | POA: Insufficient documentation

## 2021-09-10 DIAGNOSIS — Z806 Family history of leukemia: Secondary | ICD-10-CM | POA: Insufficient documentation

## 2021-09-10 DIAGNOSIS — Z79899 Other long term (current) drug therapy: Secondary | ICD-10-CM | POA: Insufficient documentation

## 2021-09-10 DIAGNOSIS — I6782 Cerebral ischemia: Secondary | ICD-10-CM | POA: Diagnosis not present

## 2021-09-10 DIAGNOSIS — I252 Old myocardial infarction: Secondary | ICD-10-CM | POA: Insufficient documentation

## 2021-09-10 DIAGNOSIS — R531 Weakness: Secondary | ICD-10-CM | POA: Diagnosis not present

## 2021-09-10 DIAGNOSIS — Z7982 Long term (current) use of aspirin: Secondary | ICD-10-CM | POA: Insufficient documentation

## 2021-09-10 DIAGNOSIS — M545 Low back pain, unspecified: Secondary | ICD-10-CM | POA: Diagnosis not present

## 2021-09-10 DIAGNOSIS — I700 Atherosclerosis of aorta: Secondary | ICD-10-CM | POA: Diagnosis not present

## 2021-09-10 NOTE — Telephone Encounter (Signed)
Patient is scheduled for in-clinic check with Juan Norris on 09/12/21.

## 2021-09-10 NOTE — Progress Notes (Deleted)
Erroneous encounter

## 2021-09-10 NOTE — Progress Notes (Signed)
See MD note for nursing evaluation. °

## 2021-09-10 NOTE — Telephone Encounter (Signed)
   [facility] Pre-operative Risk Assessment    Patient Name: Juan Norris  DOB: September 01, 1951 MRN: [id number]  HEARTCARE STAFF:  - IMPORTANT!!!!!! Under Visit Info/Reason for Call, type in Other and utilize the format Clearance MM/DD/YY or Clearance TBD. Do not use dashes or single digits. - Please review there is not already an duplicate clearance open for this procedure. - If request is for dental extraction, please clarify the # of teeth to be extracted. - If the patient is currently at the dentist's office, call Pre-Op Callback Staff (MA/nurse) to input urgent request.  - If the patient is not currently in the dentist office, please route to the Pre-Op pool.  Request for surgical clearance:  What type of surgery is being performed? ROBOTIC RIGHT VATS RIGHT LOWER LOBE WEDGE RESECTION AND PROBABLE RIGHT LOWER LOBECTOMY  When is this surgery scheduled? TBD  What type of clearance is required (medical clearance vs. Pharmacy clearance to hold med vs. Both)? MEDICAL; PT HAS A DEVICE, NO CLEARANCE FORM FOR DEVICE CLEARANCE WAS FAXED TO OUR OFFICE   Are there any medications that need to be held prior to surgery and how long?  ASA  Practice name and name of physician performing surgery? [facility]; DR. Remo Lipps [facility]  What is the office phone number? [phone]   7.   What is the office fax number? [phone]  8.   Anesthesia type (None, local, MAC, general) ? GENERAL   Julaine Hua 09/10/2021, 9:10 AM  _________________________________________________________________   (provider comments below)

## 2021-09-12 ENCOUNTER — Other Ambulatory Visit: Payer: Self-pay

## 2021-09-12 ENCOUNTER — Encounter: Payer: Self-pay | Admitting: Student

## 2021-09-12 ENCOUNTER — Ambulatory Visit (INDEPENDENT_AMBULATORY_CARE_PROVIDER_SITE_OTHER): Payer: Medicare HMO | Admitting: Student

## 2021-09-12 VITALS — BP 144/60 | HR 81 | Ht 67.0 in | Wt 155.4 lb

## 2021-09-12 DIAGNOSIS — I447 Left bundle-branch block, unspecified: Secondary | ICD-10-CM | POA: Diagnosis not present

## 2021-09-12 DIAGNOSIS — Z01818 Encounter for other preprocedural examination: Secondary | ICD-10-CM

## 2021-09-12 DIAGNOSIS — I5042 Chronic combined systolic (congestive) and diastolic (congestive) heart failure: Secondary | ICD-10-CM | POA: Diagnosis not present

## 2021-09-12 DIAGNOSIS — Z01810 Encounter for preprocedural cardiovascular examination: Secondary | ICD-10-CM | POA: Diagnosis not present

## 2021-09-12 LAB — CUP PACEART INCLINIC DEVICE CHECK
Battery Remaining Longevity: 30 mo
Battery Voltage: 2.95 V
Brady Statistic AP VP Percent: 41.48 %
Brady Statistic AP VS Percent: 0.02 %
Brady Statistic AS VP Percent: 58.01 %
Brady Statistic AS VS Percent: 0.49 %
Brady Statistic RA Percent Paced: 40.42 %
Brady Statistic RV Percent Paced: 97.14 %
Date Time Interrogation Session: 20220930095024
HighPow Impedance: 53 Ohm
Implantable Lead Implant Date: 20191126
Implantable Lead Implant Date: 20191126
Implantable Lead Implant Date: 20191126
Implantable Lead Location: 753859
Implantable Lead Location: 753860
Implantable Lead Location: 753860
Implantable Lead Model: 3830
Implantable Lead Model: 5076
Implantable Pulse Generator Implant Date: 20191126
Lead Channel Impedance Value: 228 Ohm
Lead Channel Impedance Value: 285 Ohm
Lead Channel Impedance Value: 304 Ohm
Lead Channel Impedance Value: 361 Ohm
Lead Channel Impedance Value: 399 Ohm
Lead Channel Impedance Value: 475 Ohm
Lead Channel Pacing Threshold Amplitude: 0.375 V
Lead Channel Pacing Threshold Amplitude: 0.5 V
Lead Channel Pacing Threshold Amplitude: 0.5 V
Lead Channel Pacing Threshold Pulse Width: 0.4 ms
Lead Channel Pacing Threshold Pulse Width: 0.4 ms
Lead Channel Pacing Threshold Pulse Width: 0.4 ms
Lead Channel Sensing Intrinsic Amplitude: 10.625 mV
Lead Channel Sensing Intrinsic Amplitude: 2.375 mV
Lead Channel Sensing Intrinsic Amplitude: 2.75 mV
Lead Channel Sensing Intrinsic Amplitude: 9.75 mV
Lead Channel Setting Pacing Amplitude: 1.5 V
Lead Channel Setting Pacing Amplitude: 2.5 V
Lead Channel Setting Pacing Amplitude: 2.5 V
Lead Channel Setting Pacing Pulse Width: 0.4 ms
Lead Channel Setting Pacing Pulse Width: 0.4 ms
Lead Channel Setting Sensing Sensitivity: 0.3 mV

## 2021-09-12 MED ORDER — ATORVASTATIN CALCIUM 40 MG PO TABS: 40.0000 mg | ORAL_TABLET | Freq: Every day | ORAL | 3 refills | Status: DC

## 2021-09-12 NOTE — Progress Notes (Signed)
Electrophysiology Office Note Date: 09/12/2021  ID:  Juan Norris, DOB 11/13/1951, MRN [id number]  PCP: [facility], [facility] Primary Cardiologist: Ena Dawley, MD (Inactive) Electrophysiologist: Thompson Grayer, MD   CC: Routine ICD follow-up  Juan Norris is a 70 y.o. male seen today for Thompson Grayer, MD for cardiac clearance for ROBOTIC RIGHT VATS RIGHT LOWER LOBE WEDGE RESECTION AND PROBABLE RIGHT LOWER LOBECTOMY.  Since last being seen in our clinic the patient reports doing well overall. He is able to be active and do all of his ADLs without difficult. He denies any  chest pain, palpitations, dyspnea, PND, orthopnea, nausea, vomiting, syncope, edema, weight gain, or early satiety. He has not had ICD shocks. He says he may have mild lightheadedness if he hasn't eaten in a while, but otherwise no lightheadedness or dizziness. He is open to discussing either radiation or surgical resection of his probably lung cancer.  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 (HIS lead in LV port) History of appropriate therapy: No History of AAD therapy: No  Past Medical History:  Diagnosis Date   Acute renal insufficiency    Anginal pain (HCC)    Chronic lower back pain    "due to 3 ruptured discs in my back" (11/08/2018)   High cholesterol    History of kidney stones 1982   "passed them"   Hypertension    Myocardial infarction ([place]) 98/9211   Systolic heart failure ([place])    Tobacco use    Past Surgical History:  Procedure Laterality Date   BIV ICD INSERTION CRT-D N/A 11/08/2018    Medtronic biventricular ICD implantation by Dr Rayann Heman with His Bundle pacing and a MDT 9417 lead placed into the LV port.    CARDIAC CATHETERIZATION     INGUINAL HERNIA REPAIR Bilateral    KNEE SURGERY Left 1990s   "growth on the back of my knee removed"   LEFT HEART CATH AND CORONARY ANGIOGRAPHY N/A 03/30/2018   Procedure: LEFT HEART CATH AND CORONARY ANGIOGRAPHY;  Surgeon: Burnell Blanks,  MD;  Location: [facility];  Service: Cardiovascular;  Laterality: N/A;    Current Outpatient Medications  Medication Sig Dispense Refill   aspirin 81 MG EC tablet Take 1 tablet (81 mg total) by mouth daily. 30 tablet 6   atorvastatin (LIPITOR) 40 MG tablet TAKE 1 TABLET BY MOUTH ONCE DAILY . APPOINTMENT REQUIRED FOR FUTURE REFILLS BEFORE  MARCH  2022 (Patient taking differently: Take 40 mg by mouth daily.) 15 tablet 0   carvedilol (COREG) 6.25 MG tablet Take 1 tablet (6.25 mg total) by mouth 2 (two) times daily with a meal. (Patient taking differently: Take 6.25 mg by mouth daily.) 180 tablet 3   Multiple Vitamin (MULTIVITAMIN WITH MINERALS) TABS tablet Take 1 tablet by mouth daily.     sacubitril-valsartan (ENTRESTO) 24-26 MG Take 1 tablet by mouth 2 (two) times daily. Please make overdue appt with Dr. Meda Coffee before anymore refills. 1st attempt 60 tablet 0   spironolactone (ALDACTONE) 25 MG tablet Take 0.5 tablets (12.5 mg total) by mouth daily. 15 tablet 5   No current facility-administered medications for this visit.    Allergies:   Patient has no known allergies.   Social History: Social History   Socioeconomic History   Marital status: Divorced    Spouse name: Not on file   Number of children: 1   Years of education: Not on file   Highest education level: Not on file  Occupational  History   Not on file  Tobacco Use   Smoking status: Some Days    Packs/day: 1.50    Years: 47.00    Pack years: 70.50    Types: Cigarettes   Smokeless tobacco: Never   Tobacco comments:    3 cigarettes smoked daily  [facility] 08/20/21  Vaping Use   Vaping Use: Never used  Substance and Sexual Activity   Alcohol use: Not Currently    Comment: 11/08/2018 "stopped 09/23/1998"   Drug use: Not Currently    Comment: 11/08/2018 "nothing in the 2000s"   Sexual activity: Yes  Other Topics Concern   Not on file  Social History Narrative   Retired   Investment banker, operational of Systems developer Strain: Not on file  Food Insecurity: Not on file  Transportation Needs: Not on file  Physical Activity: Not on file  Stress: Not on file  Social Connections: Not on file  Intimate Partner Violence: Not on file    Family History: Family History  Problem Relation Age of Onset   Leukemia Mother    Cancer Father    Cancer Sister    Cancer Brother    CAD Brother     Review of Systems: All other systems reviewed and are otherwise negative except as noted above.   Physical Exam: Vitals:   09/12/21 0900  BP: (!) 144/60  Pulse: 81  SpO2: 95%  Weight: 155 lb 6.4 oz (70.5 kg)  Height: 5\' 7"  (1.702 m)     GEN- The patient is well appearing, alert and oriented x 3 today.   HEENT: normocephalic, atraumatic; sclera clear, conjunctiva pink; hearing intact; oropharynx clear; neck supple, no JVP Lymph- no cervical lymphadenopathy Lungs- Clear to ausculation bilaterally, normal work of breathing.  No wheezes, rales, rhonchi Heart- Regular rate and rhythm, no murmurs, rubs or gallops, PMI not laterally displaced GI- soft, non-tender, non-distended, bowel sounds present, no hepatosplenomegaly Extremities- no clubbing or cyanosis. No edema; DP/PT/radial pulses 2+ bilaterally MS- no significant deformity or atrophy Skin- warm and dry, no rash or lesion; ICD pocket well healed Psych- euthymic mood, full affect Neuro- strength and sensation are intact  ICD interrogation- reviewed in detail today,  See PACEART report  EKG:  EKG is not ordered today. Personal review of EKG ordered  08/2021  shows AS V P at 66 bpm, QRS 172 ms (His lead)  Recent Labs: 08/31/2021: ALT 21; BUN 13; Creatinine, Ser 1.10; Hemoglobin 13.9; Platelets 210; Potassium 4.5; Sodium 142   Wt Readings from Last 3 Encounters:  09/12/21 155 lb 6.4 oz (70.5 kg)  09/10/21 157 lb 12.8 oz (71.6 kg)  09/04/21 154 lb (69.9 kg)     Other studies Reviewed: Additional studies/ records that were reviewed today include:  Previous EP, onc, and [facility] office notes.   Assessment and Plan:  1.  Chronic systolic dysfunction s/p Medtronic CRT-D (His lead in LV port) NYHA II symptoms euvolemic today Stable on an appropriate medical regimen Normal ICD function See Pace Art report No changes today Last Echo 12/2018  2. HTN Stable on current regimen   3. Cardiac Clearance for ROBOTIC RIGHT VATS RIGHT LOWER LOBE WEDGE RESECTION AND PROBABLE RIGHT LOWER LOBECTOMY Echo 12/2018 LVEF 20-25%. Will update to help risk stratify. He has NYHA II symptoms currently and appears optimized from a HF perspective.  By Revised Cardiac Risk Index [name] Criteria) he has a 15.0% 30-day risk of death, MI, or cardiac arrest.  Suspect he would be  at high, but not prohibitive risk of peri-operative complications from a cardiac perspective given his EF and the complicated nature of the procedure.    Current medicines are reviewed at length with the patient today.    Labs/ tests ordered today include:  Recent labs stable. Cr 1.09 on 9/18. CBC  Disposition:   Follow up with EP APP in 6 months  Signed, Shirley Friar, PA-C  09/12/2021 9:17 AM  [facility] [address] [place]  [address] [phone] (office) [phone] (fax)

## 2021-09-12 NOTE — Progress Notes (Signed)
Device clearance sent via Right fax function.

## 2021-09-12 NOTE — Patient Instructions (Signed)
Medication Instructions:  Your physician recommends that you continue on your current medications as directed. Please refer to the Current Medication list given to you today.  *If you need a refill on your cardiac medications before your next appointment, please call your pharmacy*   Lab Work: None If you have labs (blood work) drawn today and your tests are completely normal, you will receive your results only by: [place] (if you have MyChart) OR A paper copy in the mail If you have any lab test that is abnormal or we need to change your treatment, we will call you to review the results.   Follow-Up: At [facility], you and your health needs are our priority.  As part of our continuing mission to provide you with exceptional heart care, we have created designated Provider Care Teams.  These Care Teams include your primary Cardiologist (physician) and Advanced Practice Providers (APPs -  Physician Assistants and Nurse Practitioners) who all work together to provide you with the care you need, when you need it.  We recommend signing up for the patient portal called "MyChart".  Sign up information is provided on this After Visit Summary.  MyChart is used to connect with patients for Virtual Visits (Telemedicine).  Patients are able to view lab/test results, encounter notes, upcoming appointments, etc.  Non-urgent messages can be sent to your provider as well.   To learn more about what you can do with MyChart, go to [email or web address].    Your next appointment:   6 month(s)  The format for your next appointment:   In Person  Provider:   You may see Thompson Grayer, MD or one of the following Advanced Practice Providers on your designated Care Team:   Tommye Standard, [place] Legrand Como "[facility]" [place], [place]

## 2021-09-12 NOTE — Progress Notes (Signed)
PERIOPERATIVE PRESCRIPTION FOR IMPLANTED CARDIAC DEVICE PROGRAMMING  Patient Information: Name:  MARKANTHONY GEDNEY  DOB:  09/29/1951  MRN:  [id number]    Request for surgical clearance:   What type of surgery is being performed? ROBOTIC RIGHT VATS RIGHT LOWER LOBE WEDGE RESECTION AND PROBABLE RIGHT LOWER LOBECTOMY   When is this surgery scheduled? TBD   What type of clearance is required (medical clearance vs. Pharmacy clearance to hold med vs. Both)? MEDICAL; PT HAS A DEVICE, NO CLEARANCE FORM FOR DEVICE CLEARANCE WAS FAXED TO OUR OFFICE    Are there any medications that need to be held prior to surgery and how long?  ASA   Practice name and name of physician performing surgery? [facility]; DR. Remo Lipps [facility]   What is the office phone number? [phone]   7.   What is the office fax number? [phone]   8.   Anesthesia type (None, local, MAC, general) ? GENERAL Device Information:  Clinic EP Physician:  Thompson Grayer, MD   Device Type:  Defibrillator Manufacturer and Phone #:  Medtronic: [id number] Pacemaker Dependent?:  No. Date of Last Device Check:  09/12/21 Normal Device Function?:  Yes.    Electrophysiologist's Recommendations:  Have magnet available. Provide continuous ECG monitoring when magnet is used or reprogramming is to be performed.  Procedure may interfere with device function.  Magnet should be placed over device during procedure. If unable to keep magnet over device throughout procedure, please contact industry for reprogramming to inhibit tachy therapy.    Per Device Clinic Standing Orders, York Ram, RN  12:37 PM 09/12/2021

## 2021-09-16 ENCOUNTER — Other Ambulatory Visit (HOSPITAL_BASED_OUTPATIENT_CLINIC_OR_DEPARTMENT_OTHER): Payer: Medicare HMO

## 2021-09-23 ENCOUNTER — Other Ambulatory Visit: Payer: Self-pay

## 2021-09-23 ENCOUNTER — Ambulatory Visit (HOSPITAL_COMMUNITY): Payer: Medicare HMO | Attending: Cardiovascular Disease

## 2021-09-23 DIAGNOSIS — I447 Left bundle-branch block, unspecified: Secondary | ICD-10-CM | POA: Diagnosis not present

## 2021-09-23 DIAGNOSIS — I5042 Chronic combined systolic (congestive) and diastolic (congestive) heart failure: Secondary | ICD-10-CM | POA: Diagnosis not present

## 2021-09-23 LAB — ECHOCARDIOGRAM COMPLETE
AR max vel: 1.87 cm2
AV Area VTI: 1.75 cm2
AV Area mean vel: 1.86 cm2
AV Mean grad: 7.8 mmHg
AV Peak grad: 14.3 mmHg
Ao pk vel: 1.89 m/s
Area-P 1/2: 1.5 cm2
P 1/2 time: 452 msec
S' Lateral: 3.9 cm

## 2021-09-23 MED ORDER — PERFLUTREN LIPID MICROSPHERE
1.0000 mL | INTRAVENOUS | Status: AC | PRN
Start: 2021-09-23 — End: 2021-09-23
  Administered 2021-09-23: 2 mL via INTRAVENOUS

## 2021-09-24 ENCOUNTER — Telehealth: Payer: Self-pay | Admitting: Student

## 2021-09-24 NOTE — Telephone Encounter (Signed)
Juan Norris is calling back requesting to speak with someone about the results April gave him today about his Echo. He has a follow up question in regards to it.

## 2021-09-25 ENCOUNTER — Telehealth: Payer: Self-pay | Admitting: Emergency Medicine

## 2021-09-25 ENCOUNTER — Ambulatory Visit (INDEPENDENT_AMBULATORY_CARE_PROVIDER_SITE_OTHER): Payer: No Typology Code available for payment source | Admitting: Emergency Medicine

## 2021-09-25 ENCOUNTER — Other Ambulatory Visit: Payer: Self-pay

## 2021-09-25 ENCOUNTER — Encounter: Payer: Self-pay | Admitting: Emergency Medicine

## 2021-09-25 VITALS — BP 132/56 | HR 66 | Temp 98.1°F | Resp 16 | Ht 67.0 in | Wt 153.8 lb

## 2021-09-25 DIAGNOSIS — R911 Solitary pulmonary nodule: Secondary | ICD-10-CM

## 2021-09-25 NOTE — Telephone Encounter (Signed)
I spoke to pt & gave him appt info.  Made him aware to stop aspirin 2 days prior.

## 2021-09-25 NOTE — Patient Instructions (Addendum)
We will arrange for navigational bronchoscopy to further evaluate your right lower lobe pulmonary nodule and hopefully facilitate radiation treatment. This will be done under general anesthesia as an outpatient at [facility] endoscopy.  You will need a designated driver and someone to be with you on the day of the procedure after you get home.  You will need to hold your aspirin 2 days prior to the procedure.  We we will work on getting this set up in the next few weeks. Follow with Dr Lamonte Sakai in 1 month

## 2021-09-25 NOTE — Telephone Encounter (Signed)
Returned call to pt. No answer. LM

## 2021-09-25 NOTE — Telephone Encounter (Signed)
I scheduled pt for 10/31 at 11:00.  CT will be 10/21 at [facility].  Pt will need to go for covid test on 10/28.  Left vm for pt to call me back for appt info.

## 2021-09-25 NOTE — H&P (View-Only) (Signed)
Subjective:    Patient ID: Juan Norris, male    DOB: [birth year], 70 y.o.   MRN: [id number]  HPI 70 year old gentleman, active smoker (34 pack years, has cut down to 2 cig a day) with a history of CAD, systolic CHF w AICD, hypertension, hypercholesterolemia, renal insufficiency.  He is referred today to review abnormal CT chest.   He has no breathing limitations, is active, likes to walk. No cough or wheeze. Good functional capacity  Lung cancer screening CT 07/30/2021 report reviewed by me, shows a 2.2 x 1.2 solid superior right lower lobe nodule that is increased in size from 7 mm on his previous scan from 01/29/2021.   ROV 09/25/21 --follow-up visit for 70 year old gentleman with a history of tobacco use, CAD with systolic CHF and AICD in place.  Also with hypertension and hyperlipidemia.  He was found to have a 2.2 x 1.2 right lower lobe pulmonary nodule that increased in size compared with 01/29/2021.  He underwent PET scan and PFT as below. He has seen both Dr Roxan Hockey and Dr Sondra Come. He is considering VATS resection, but he is now having second thoughts due to the associated risk and his cardiac disease. He is leaning now towards SBRT, tells me that he is concerned about the risks associated with thoracic surgery. That said we need to work on [name]  PET scan 09/02/2021 reviewed by me shows that his right lower lobe pulmonary nodule is hypermetabolic.  There are some vague groundglass change in the left upper lobe that shows some very low level hypermetabolism as well, likely inflammatory.  I do not see any mediastinal or hilar lymphadenopathy.  Pulmonary function testing 08/26/2021 reviewed by me shows mild to moderate obstruction without a bronchodilator response, normal lung volumes, decreased few capacity.   Review of Systems As per HPI  Past Medical History:  Diagnosis Date   Acute renal insufficiency    Anginal pain (HCC)    Chronic lower back pain    "due to 3 ruptured discs in  my back" (11/08/2018)   High cholesterol    History of kidney stones 1982   "passed them"   Hypertension    Myocardial infarction ([place]) 47/8295   Systolic heart failure (HCC)    Tobacco use      Family History  Problem Relation Age of Onset   Leukemia Mother    Cancer Father    Cancer Sister    Cancer Brother    CAD Brother      Social History   Socioeconomic History   Marital status: Divorced    Spouse name: Not on file   Number of children: 1   Years of education: Not on file   Highest education level: Not on file  Occupational History   Not on file  Tobacco Use   Smoking status: Some Days    Packs/day: 1.50    Years: 47.00    Pack years: 70.50    Types: Cigarettes   Smokeless tobacco: Never   Tobacco comments:    3 cigarettes smoked daily  [place] 09/25/2021  Vaping Use   Vaping Use: Never used  Substance and Sexual Activity   Alcohol use: Not Currently    Comment: 11/08/2018 "stopped 09/23/1998"   Drug use: Not Currently    Comment: 11/08/2018 "nothing in the 2000s"   Sexual activity: Yes  Other Topics Concern   Not on file  Social History Narrative   Retired   Investment banker, operational of Health  Financial Resource Strain: Not on file  Food Insecurity: Not on file  Transportation Needs: Not on file  Physical Activity: Not on file  Stress: Not on file  Social Connections: Not on file  Intimate Partner Violence: Not on file    Was in the [employer], medical x 20 yrs Went to [place], [place] in [place], [place], [place], [place]   No Known Allergies   Outpatient Medications Prior to Visit  Medication Sig Dispense Refill   aspirin 81 MG EC tablet Take 1 tablet (81 mg total) by mouth daily. 30 tablet 6   atorvastatin (LIPITOR) 40 MG tablet Take 1 tablet (40 mg total) by mouth daily. 90 tablet 3   carvedilol (COREG) 6.25 MG tablet Take 1 tablet (6.25 mg total) by mouth 2 (two) times daily with a meal. (Patient taking differently: Take 6.25 mg by mouth daily.) 180 tablet 3    Multiple Vitamin (MULTIVITAMIN WITH MINERALS) TABS tablet Take 1 tablet by mouth daily.     sacubitril-valsartan (ENTRESTO) 24-26 MG Take 1 tablet by mouth 2 (two) times daily. Please make overdue appt with Dr. Meda Coffee before anymore refills. 1st attempt 60 tablet 0   spironolactone (ALDACTONE) 25 MG tablet Take 0.5 tablets (12.5 mg total) by mouth daily. 15 tablet 5   No facility-administered medications prior to visit.          Objective:   Physical Exam Vitals:   09/25/21 1043  BP: (!) 132/56  Pulse: 66  Resp: 16  Temp: 98.1 F (36.7 C)  TempSrc: Skin  SpO2: 100%  Weight: 153 lb 12.8 oz (69.8 kg)  Height: 5\' 7"  (1.702 m)   Gen: Pleasant, thin gentleman, in no distress,  normal affect  ENT: No lesions,  mouth clear,  oropharynx clear, no postnasal drip  Neck: No JVD, no stridor  Lungs: No use of accessory muscles, no crackles or wheezing on normal respiration, no wheeze on forced expiration  Cardiovascular: RRR, heart sounds normal, no murmur or gallops, no peripheral edema  Musculoskeletal: No deformities, no cyanosis or clubbing  Neuro: alert, awake, non focal  Skin: Warm, no lesions or rash     Assessment & Plan:   Pulmonary nodule PET scan reviewed with him today shows that the right lower lobe pulmonary nodule hypermetabolic, looks like it is probably stage I disease without any evidence mediastinal lymphadenopathy.  Has had a chance to talk to both Dr. Roxan Hockey and Dr. Sondra Come.  He tells me today that after thinking it through he wants to pursue SBRT.  Given that decision I think we need to arrange for him to have bronchoscopy to get a tissue diagnosis.  I will work on setting up robotic assisted navigation.   We will arrange for navigational bronchoscopy to further evaluate your right lower lobe pulmonary nodule and hopefully facilitate radiation treatment. This will be done under general anesthesia as an outpatient at [facility] endoscopy.  You will need  a designated driver and someone to be with you on the day of the procedure after you get home.  You will need to hold your aspirin 2 days prior to the procedure.  We we will work on getting this set up in the next few weeks. Follow with Dr Lamonte Sakai in 1 month   Baltazar Apo, MD, PhD 09/25/2021, 5:21 PM [facility] [phone] or if no answer before 7:00PM call [phone] For any issues after 7:00PM please call eLink [phone]

## 2021-09-25 NOTE — Assessment & Plan Note (Signed)
PET scan reviewed with him today shows that the right lower lobe pulmonary nodule hypermetabolic, looks like it is probably stage I disease without any evidence mediastinal lymphadenopathy.  Has had a chance to talk to both Dr. Roxan Hockey and Dr. Sondra Come.  He tells me today that after thinking it through he wants to pursue SBRT.  Given that decision I think we need to arrange for him to have bronchoscopy to get a tissue diagnosis.  I will work on setting up robotic assisted navigation.   We will arrange for navigational bronchoscopy to further evaluate your right lower lobe pulmonary nodule and hopefully facilitate radiation treatment. This will be done under general anesthesia as an outpatient at [facility] endoscopy.  You will need a designated driver and someone to be with you on the day of the procedure after you get home.  You will need to hold your aspirin 2 days prior to the procedure.  We we will work on getting this set up in the next few weeks. Follow with Dr Lamonte Sakai in 1 month

## 2021-09-25 NOTE — Progress Notes (Signed)
Subjective:    Patient ID: Juan Norris, male    DOB: December 20, 1950, 70 y.o.   MRN: [id number]  HPI 70 year old gentleman, active smoker (34 pack years, has cut down to 2 cig a day) with a history of CAD, systolic CHF w AICD, hypertension, hypercholesterolemia, renal insufficiency.  He is referred today to review abnormal CT chest.   He has no breathing limitations, is active, likes to walk. No cough or wheeze. Good functional capacity  Lung cancer screening CT 07/30/2021 report reviewed by me, shows a 2.2 x 1.2 solid superior right lower lobe nodule that is increased in size from 7 mm on his previous scan from 01/29/2021.   ROV 09/25/21 --follow-up visit for 70 year old gentleman with a history of tobacco use, CAD with systolic CHF and AICD in place.  Also with hypertension and hyperlipidemia.  He was found to have a 2.2 x 1.2 right lower lobe pulmonary nodule that increased in size compared with 01/29/2021.  He underwent PET scan and PFT as below. He has seen both Dr Roxan Hockey and Dr Sondra Come. He is considering VATS resection, but he is now having second thoughts due to the associated risk and his cardiac disease. He is leaning now towards SBRT, tells me that he is concerned about the risks associated with thoracic surgery. That said we need to work on [place]  PET scan 09/02/2021 reviewed by me shows that his right lower lobe pulmonary nodule is hypermetabolic.  There are some vague groundglass change in the left upper lobe that shows some very low level hypermetabolism as well, likely inflammatory.  I do not see any mediastinal or hilar lymphadenopathy.  Pulmonary function testing 08/26/2021 reviewed by me shows mild to moderate obstruction without a bronchodilator response, normal lung volumes, decreased few capacity.   Review of Systems As per HPI  Past Medical History:  Diagnosis Date   Acute renal insufficiency    Anginal pain (HCC)    Chronic lower back pain    "due to 3 ruptured discs in  my back" (11/08/2018)   High cholesterol    History of kidney stones 1982   "passed them"   Hypertension    Myocardial infarction ([place]) 42/7062   Systolic heart failure (HCC)    Tobacco use      Family History  Problem Relation Age of Onset   Leukemia Mother    Cancer Father    Cancer Sister    Cancer Brother    CAD Brother      Social History   Socioeconomic History   Marital status: Divorced    Spouse name: Not on file   Number of children: 1   Years of education: Not on file   Highest education level: Not on file  Occupational History   Not on file  Tobacco Use   Smoking status: Some Days    Packs/day: 1.50    Years: 47.00    Pack years: 70.50    Types: Cigarettes   Smokeless tobacco: Never   Tobacco comments:    3 cigarettes smoked daily  [place] 70/13/2022  Vaping Use   Vaping Use: Never used  Substance and Sexual Activity   Alcohol use: Not Currently    Comment: 11/08/2018 "stopped 09/23/1998"   Drug use: Not Currently    Comment: 11/08/2018 "nothing in the 2000s"   Sexual activity: Yes  Other Topics Concern   Not on file  Social History Narrative   Retired   Investment banker, operational of Health  Financial Resource Strain: Not on file  Food Insecurity: Not on file  Transportation Needs: Not on file  Physical Activity: Not on file  Stress: Not on file  Social Connections: Not on file  Intimate Partner Violence: Not on file    Was in the [employer], medical x 20 yrs Went to [place], [place] in [place], [place], [place], [place]   No Known Allergies   Outpatient Medications Prior to Visit  Medication Sig Dispense Refill   aspirin 81 MG EC tablet Take 1 tablet (81 mg total) by mouth daily. 30 tablet 6   atorvastatin (LIPITOR) 40 MG tablet Take 1 tablet (40 mg total) by mouth daily. 90 tablet 3   carvedilol (COREG) 6.25 MG tablet Take 1 tablet (6.25 mg total) by mouth 2 (two) times daily with a meal. (Patient taking differently: Take 6.25 mg by mouth daily.) 180 tablet 3    Multiple Vitamin (MULTIVITAMIN WITH MINERALS) TABS tablet Take 1 tablet by mouth daily.     sacubitril-valsartan (ENTRESTO) 24-26 MG Take 1 tablet by mouth 2 (two) times daily. Please make overdue appt with Dr. Meda Coffee before anymore refills. 1st attempt 60 tablet 0   spironolactone (ALDACTONE) 25 MG tablet Take 0.5 tablets (12.5 mg total) by mouth daily. 15 tablet 5   No facility-administered medications prior to visit.          Objective:   Physical Exam Vitals:   09/25/21 1043  BP: (!) 132/56  Pulse: 66  Resp: 16  Temp: 98.1 F (36.7 C)  TempSrc: Skin  SpO2: 100%  Weight: 153 lb 12.8 oz (69.8 kg)  Height: 5\' 7"  (1.702 m)   Gen: Pleasant, thin gentleman, in no distress,  normal affect  ENT: No lesions,  mouth clear,  oropharynx clear, no postnasal drip  Neck: No JVD, no stridor  Lungs: No use of accessory muscles, no crackles or wheezing on normal respiration, no wheeze on forced expiration  Cardiovascular: RRR, heart sounds normal, no murmur or gallops, no peripheral edema  Musculoskeletal: No deformities, no cyanosis or clubbing  Neuro: alert, awake, non focal  Skin: Warm, no lesions or rash     Assessment & Plan:   Pulmonary nodule PET scan reviewed with him today shows that the right lower lobe pulmonary nodule hypermetabolic, looks like it is probably stage I disease without any evidence mediastinal lymphadenopathy.  Has had a chance to talk to both Dr. Roxan Hockey and Dr. Sondra Come.  He tells me today that after thinking it through he wants to pursue SBRT.  Given that decision I think we need to arrange for him to have bronchoscopy to get a tissue diagnosis.  I will work on setting up robotic assisted navigation.   We will arrange for navigational bronchoscopy to further evaluate your right lower lobe pulmonary nodule and hopefully facilitate radiation treatment. This will be done under general anesthesia as an outpatient at [facility] endoscopy.  You will need  a designated driver and someone to be with you on the day of the procedure after you get home.  You will need to hold your aspirin 2 days prior to the procedure.  We we will work on getting this set up in the next few weeks. Follow with Dr Lamonte Sakai in 1 month   Baltazar Apo, MD, PhD 09/25/2021, 5:21 PM  Pulmonary and Critical Care [phone] or if no answer before 7:00PM call [phone] For any issues after 7:00PM please call eLink [phone]

## 2021-09-29 ENCOUNTER — Ambulatory Visit (INDEPENDENT_AMBULATORY_CARE_PROVIDER_SITE_OTHER): Payer: No Typology Code available for payment source

## 2021-09-29 DIAGNOSIS — I5042 Chronic combined systolic (congestive) and diastolic (congestive) heart failure: Secondary | ICD-10-CM

## 2021-09-29 DIAGNOSIS — Z95810 Presence of automatic (implantable) cardiac defibrillator: Secondary | ICD-10-CM | POA: Diagnosis not present

## 2021-10-03 ENCOUNTER — Ambulatory Visit (INDEPENDENT_AMBULATORY_CARE_PROVIDER_SITE_OTHER)
Admission: RE | Admit: 2021-10-03 | Discharge: 2021-10-03 | Disposition: A | Discharge status: Home / Self Care | Payer: Medicare HMO | Source: Ambulatory Visit | Attending: Emergency Medicine | Admitting: Emergency Medicine

## 2021-10-03 ENCOUNTER — Other Ambulatory Visit: Payer: Self-pay

## 2021-10-03 DIAGNOSIS — R911 Solitary pulmonary nodule: Secondary | ICD-10-CM | POA: Diagnosis not present

## 2021-10-03 IMAGING — CT CT CHEST SUPER D W/O CM
2 of 5 series · 15 of 36 positions shown, 18 images · non-contrast
Comparison: [DATE].

CLINICAL DATA: A 70-year-old male presents for evaluation of a
pulmonary lesion.

EXAM:
CT CHEST WITHOUT CONTRAST
TECHNIQUE: Multidetector CT imaging of the chest was performed using thin slice
collimation for electromagnetic bronchoscopy planning purposes,
without intravenous contrast.

[Series 4: thins · axial · 0.75mm/px · z∈[-462,-167]mm · 12 of 427 slices shown, 15 images]
[im 29/427  mediastinal]
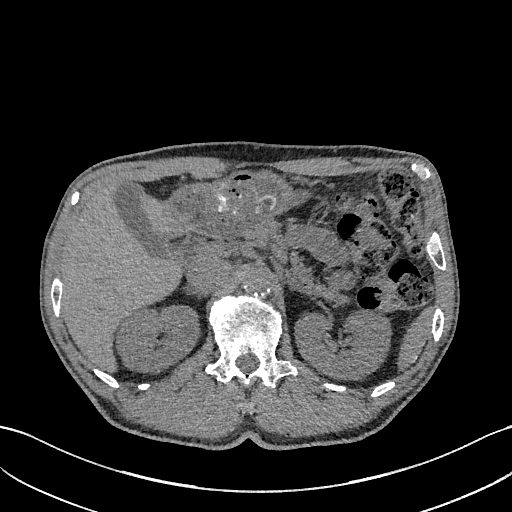
[im 29/427  lung]
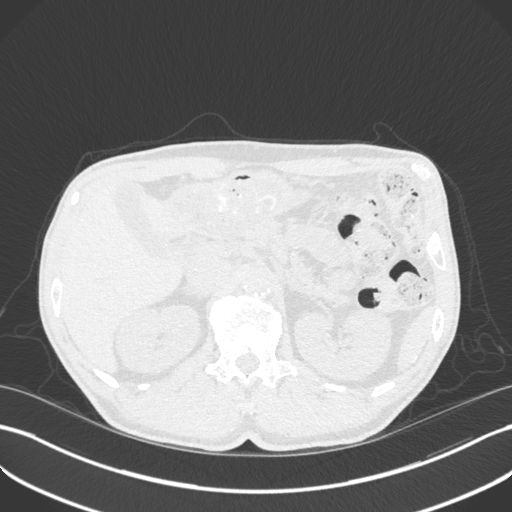
[im 57/427  lung]
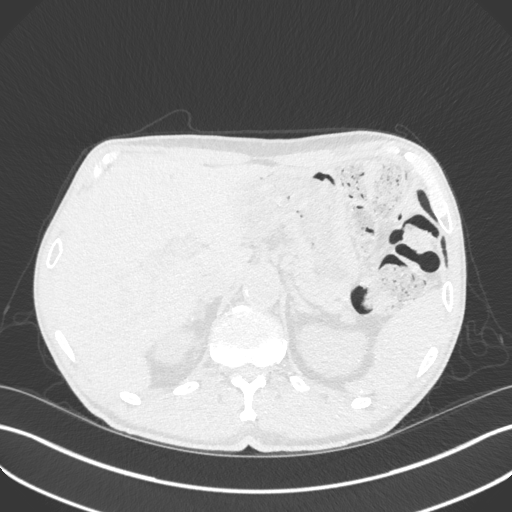
[im 86/427  lung]
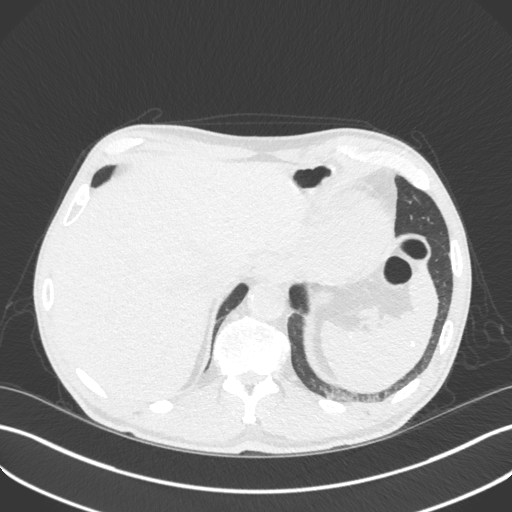
[im 143/427  lung]
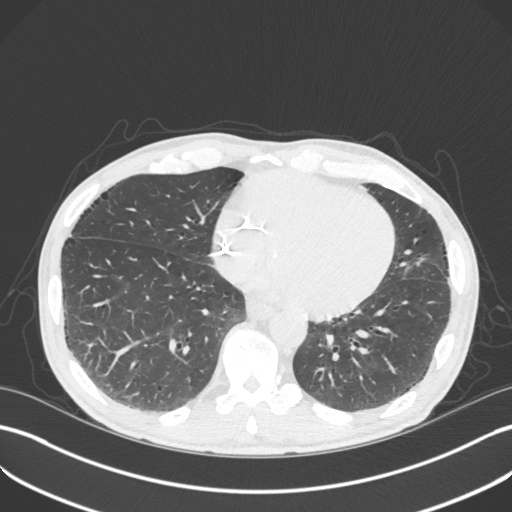
[im 171/427  mediastinal]
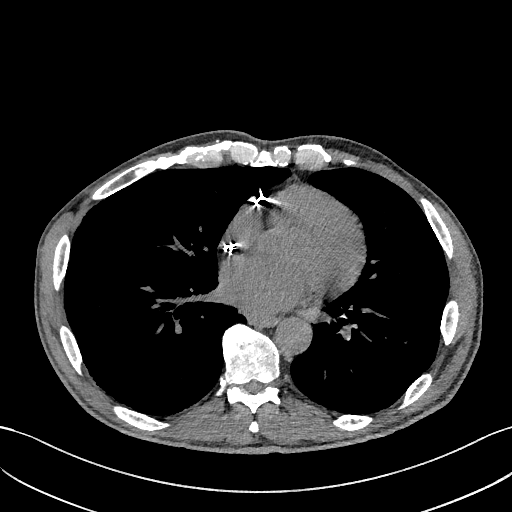
[im 171/427  lung]
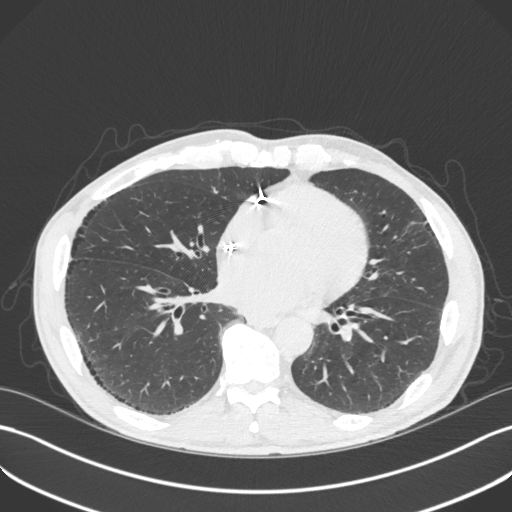
[im 199/427  lung]
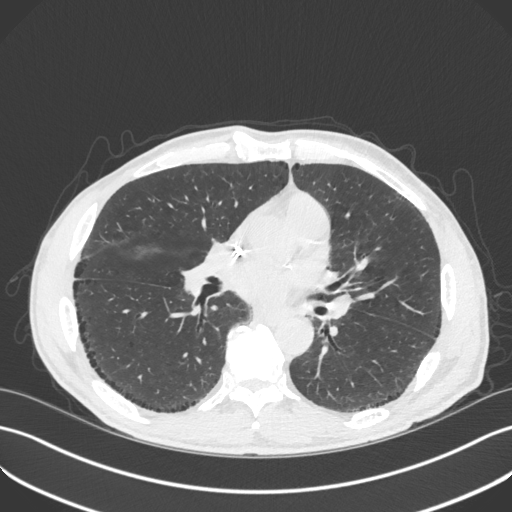
[im 228/427  lung]
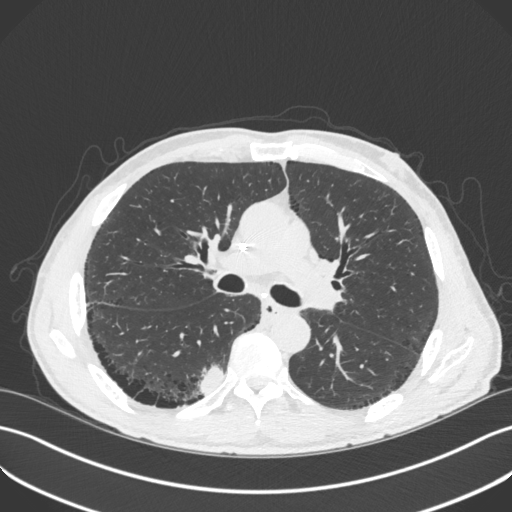
[im 256/427  lung]
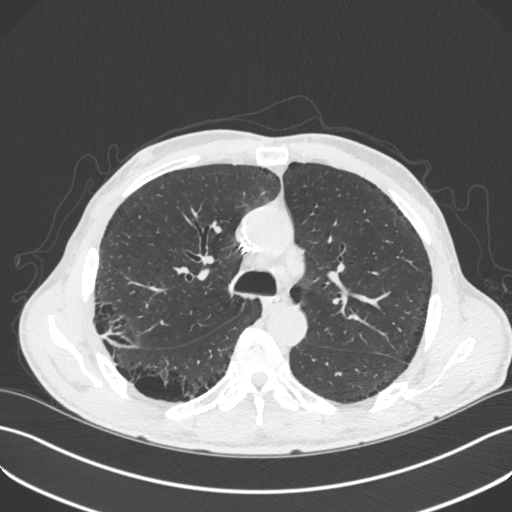
[im 285/427  mediastinal]
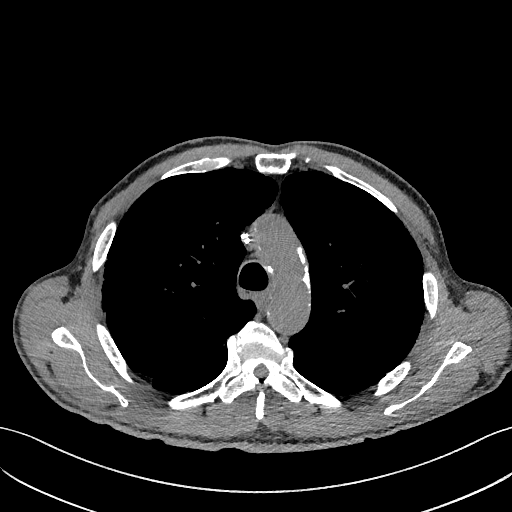
[im 285/427  lung]
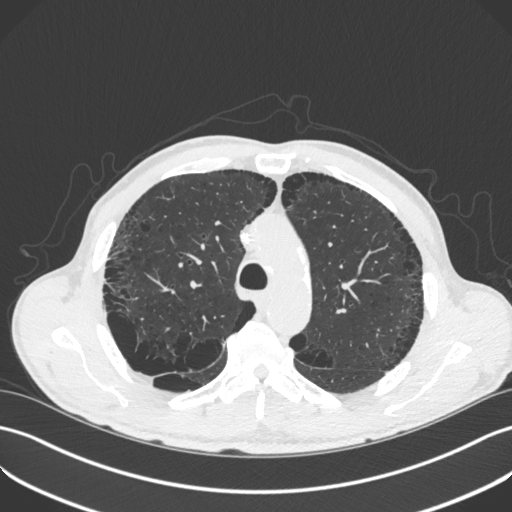
[im 341/427  lung]
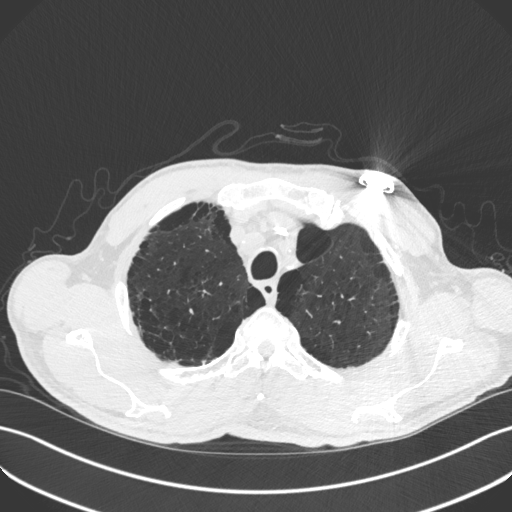
[im 370/427  lung]
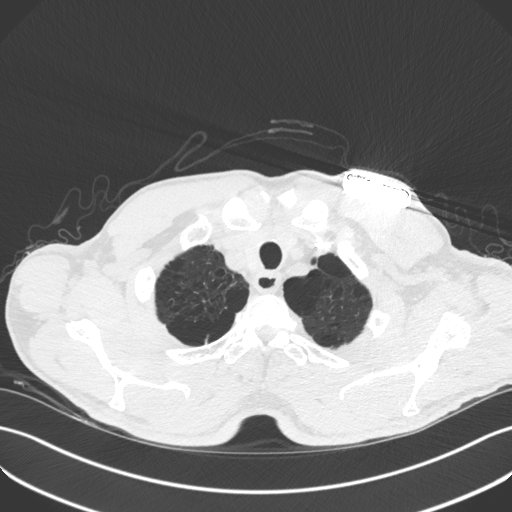
[im 398/427  lung]
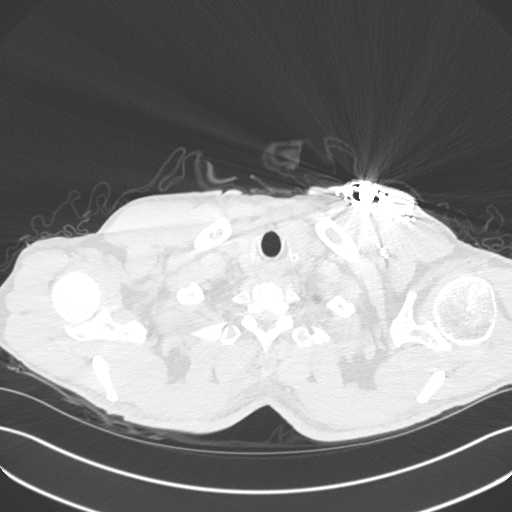

[Series 5: coronal · coronal · 0.67mm/px · 3 of 81 slices shown]
[im 17/81  lung]
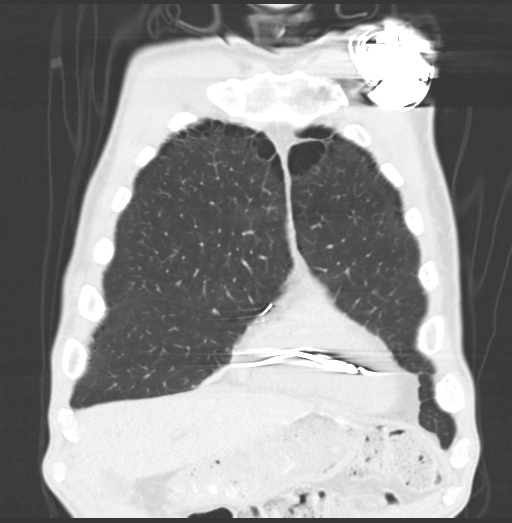
[im 33/81  lung]
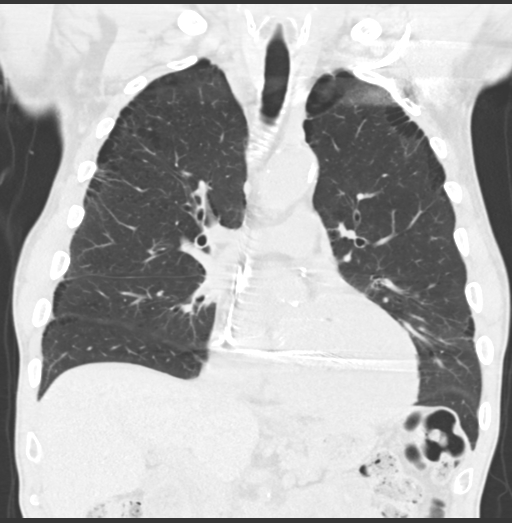
[im 49/81  lung]
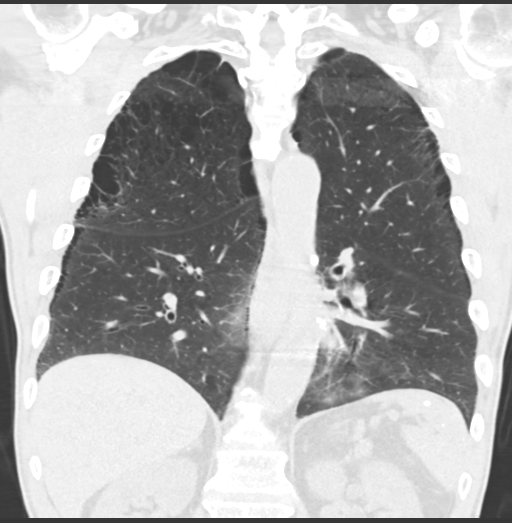

[15 of 36 positions shown; findings below may reference images not displayed]

FINDINGS: Cardiovascular: Calcified atheromatous plaque of the thoracic aorta.
No aneurysmal dilation. Normal caliber of central pulmonary
vasculature. Normal heart size. Limited assessment of cardiovascular
structures given lack of intravenous contrast.

Mediastinum/Nodes: No thoracic inlet, axillary, mediastinal or hilar
adenopathy. Esophagus grossly normal. Cardiac pacer defibrillator
with power pack over the LEFT chest with streak artifact at the LEFT
thoracic inlet. Unchanged from previous imaging.

Lungs/Pleura: Pulmonary nodule with irregular margins amidst
paraseptal emphysematous changes in the posterior RIGHT lower lobe
measures 2.3 x 1.1 cm greatest axial dimension and 1.8 cm greatest
craniocaudal dimension. This abuts the pleura in the medial RIGHT
chest. Marked paraseptal emphysema is worse at the lung apices with
moderate bullous changes. No consolidation or effusion. Airways are
patent.

Small sub solid nodule in the superior segment of the RIGHT lower
lobe (image 68/3)

Upper Abdomen: Incidental imaging of upper abdominal contents
without acute process. Granulomatous changes present in the spleen.

Musculoskeletal: No acute bone finding. No destructive bone process.
Spinal degenerative changes.
IMPRESSION: Pulmonary nodule with irregular margins amidst paraseptal
emphysematous changes in the posterior RIGHT lower lobe measures
x 1.1 cm greatest axial dimension and 1.8 cm greatest craniocaudal
dimension. This abuts the pleura in the medial RIGHT chest. This was
shown to be hypermetabolic on PET compatible with bronchogenic
neoplasm.

Small sub solid nodule in the superior segment of the RIGHT lower
lobe. Possibility of additional site of disease is considered, this
is adjacent to the dominant area described above.

Marked paraseptal emphysema is worse at the lung apices with
moderate bullous changes.

Aortic Atherosclerosis ([UC]-[UC]) and Emphysema ([UC]-[UC]).

## 2021-10-03 NOTE — Progress Notes (Signed)
EPIC Encounter for ICM Monitoring  Patient Name: ERICO STAN is a 70 y.o. male Date: 10/03/2021 Primary Care Physican: [facility] Primary Cardiologist: Johney Frame Electrophysiologist: Allred Bi-V Pacing:   95.2%       09/12/2021 Weight: 155 lbs                                                          Transmission reviewed.    Optivol Thoracic impedance suggesting normal fluid levels.    Prescribed: Spironolactone 25 mg take 0.5 tablet (12.5 mg total) daily   Labs: 02/12/2021 Creatinine 0.95, BUN 11, Potassium 4.4, Sodium 139, GFR 87 A complete set of results can be found in Results Review.   Recommendations:  No changes   Follow-up plan: ICM clinic phone appointment on 10/20/2021 to recheck fluids post 10/31 sugery.   91 day device clinic remote transmission 11/12/2021.      EP/Cardiology Office Visits: Recall 03/11/2022 with Dr Rayann Heman.  Recall 02/07/2022 with Dr Johney Frame.   Copy of ICM check sent to Dr. Rayann Heman.     3 month ICM trend: 09/29/2021.    1 Year ICM trend:       Rosalene Billings, RN 10/03/2021 11:13 AM

## 2021-10-08 ENCOUNTER — Other Ambulatory Visit: Payer: Self-pay

## 2021-10-08 NOTE — Progress Notes (Signed)
error 

## 2021-10-09 ENCOUNTER — Encounter: Payer: Self-pay | Admitting: Internal Medicine

## 2021-10-09 ENCOUNTER — Encounter (HOSPITAL_COMMUNITY): Payer: Self-pay | Admitting: Emergency Medicine

## 2021-10-09 ENCOUNTER — Other Ambulatory Visit: Payer: Self-pay

## 2021-10-09 NOTE — Progress Notes (Signed)
PERIOPERATIVE PRESCRIPTION FOR IMPLANTED CARDIAC DEVICE PROGRAMMING  Patient Information: Name:  Juan Norris  DOB:  02/07/1951  MRN:  [id number]     Planned Procedure:  (no orders but posting says robotic video bronchoscopy with endobronchial navigation)  Surgeon:  Dr. Baltazar Apo  Date of Procedure:  10/13/21  Cautery will be used.  Position during surgery:     Please send documentation back to:  [facility] (Fax # [phone])   Device Information:  Clinic EP Physician:  Thompson Grayer, MD   Device Type:  Pacemaker and Defibrillator Medtronic [phone] Claria MRI CRT-D Manufacturer and Phone #:  Medtronic: [phone] Pacemaker Dependent?:  No. Date of Last Device Check:  10/17 remote, 9/30 in-clinic Normal Device Function?:  Yes.    Electrophysiologist's Recommendations:  Have magnet available. Provide continuous ECG monitoring when magnet is used or reprogramming is to be performed.  Procedure may interfere with device function.  Magnet should be placed over device during procedure.  Per Device Clinic Standing Orders, Willadean Carol, [place]  4:39 PM 10/09/2021

## 2021-10-09 NOTE — Progress Notes (Addendum)
PCP - [facility]  Cardiologist - Dr. Rayann Heman and Dr. Meda Coffee EKG - 08/31/21 Chest x-ray -  ECHO - 09/23/21 Cardiac Cath - 2019  DEVICE ORDERS REQ'D AND MEDTRONIC REP NOTIFIED  Blood Thinner Instructions:  Aspirin Instructions: per pt, per MD instructions to stop ASA 2 days prior  ERAS Protcol - n/a  COVID TEST- DOS (pt does not have transportation and public bus per pt would only arrive at bus stop at 3pm and pt would not make it on time for [facility] test site)   Asked if pt could come to [facility] short stay to be covid tested on 10/28 for surgery on 10/31, per pt, he would need to take public bus and bus would not arrive until about 1530 and pt would have to walk to hospital and get here by 1600.  Pt offered to be tested DOS because lack of transportation. Pt advised and encouraged to wear a mask while in public prior to procedure day to avoid exposures.  Pt verbalized understanding.   Anesthesia review: yes  -------------  SDW INSTRUCTIONS:  Your procedure is scheduled on Monday 10/13/21. Please report to [facility] Main Entrance "A" at 0800 A.M., and check in at the Admitting office. Call this number if you have problems the morning of surgery: [phone]   Remember: Do not eat or drink after midnight the night before your surgery   Medications to take morning of surgery with a sip of water include: carvedilol (COREG)   As of today, STOP taking any Aspirin (unless otherwise instructed by your surgeon), Aleve, Naproxen, Ibuprofen, Motrin, Advil, Goody's, BC's, all herbal medications, fish oil, and all vitamins.    The Morning of Surgery Do not wear jewelry Do not wear lotions, powders, colognes, or deodorant  Men may shave face and neck. Do not bring valuables to the hospital. [facility] is not responsible for any belongings or valuables.  If you are a smoker, DO NOT Smoke 24 hours prior to surgery  If you wear a CPAP at night please bring your mask the morning  of surgery   Remember that you must have someone to transport you home after your surgery, and remain with you for 24 hours if you are discharged the same day.  Please bring cases for contacts, glasses, hearing aids, dentures or bridgework because it cannot be worn into surgery.   Patients discharged the day of surgery will not be allowed to drive home.   Please shower the NIGHT BEFORE/MORNING OF SURGERY (use antibacterial soap like DIAL soap if possible). Wear comfortable clothes the morning of surgery. Oral Hygiene is also important to reduce your risk of infection.  Remember - BRUSH YOUR TEETH THE MORNING OF SURGERY WITH YOUR REGULAR TOOTHPASTE  Patient denies shortness of breath, fever, cough and chest pain.

## 2021-10-10 NOTE — Progress Notes (Signed)
Anesthesia Chart Review: SAME DAY WORK-UP  Case: [id number] Date/Time: 10/13/21 1100   Procedure: ROBOTIC VIDEO BRONCHOSCOPY WITH ENDOBRONCHIAL NAVIGATION   Anesthesia type: General   Pre-op diagnosis: LUNG NODULE   Location: [facility] END[facility] ROOM 3 / [place] ENDOSCOPY   Surgeons: Juan Gobble, MD       DISCUSSION: Patient is a 70 year old male scheduled for the above procedure. He was referred to Dr. Lamonte Norris after lung cancer screening CT showed RLL lung nodule, hypermetabolic on PET scan.   History includes smoking, HTN, CAD/MI (non-obstructive CAD 03/2018), angina (history of), chronic systolic CHF, ICD (Medtronic 11/08/18) hypercholesteremia  He had cardiology preoperative evaluation on 09/12/21 by Juan Ellison, PA-C for right VATS/RLL (although now consider SBRT). Note states, "Cardiac Clearance for ROBOTIC RIGHT VATS RIGHT LOWER LOBE WEDGE RESECTION AND PROBABLE RIGHT LOWER LOBECTOMY Echo 12/2018 LVEF 20-25%. Will update to help risk stratify. He has NYHA II symptoms currently and appears optimized from a HF perspective.  By Revised Cardiac Risk Index Juan Norris Criteria) he has a 15.0% 30-day risk of death, MI, or cardiac arrest.  Suspect he would be at high, but not prohibitive risk of peri-operative complications from a cardiac perspective given his EF and the complicated nature of the procedure."     Perioperative ICD Rx:  Device Type:  Pacemaker and Defibrillator Medtronic [id number] Claria MRI CRT-D Manufacturer and Phone #:  Medtronic: [phone] Pacemaker Dependent?:  No. Date of Last Device Check:  10/17 remote, 9/30 in-clinic   Normal Device Function?:  Yes.     Electrophysiologist's Recommendations: Have magnet available. Provide continuous ECG monitoring when magnet is used or reprogramming is to be performed.  Procedure may interfere with device function.  Magnet should be placed over device during procedure.  Anesthesia team to evaluate on the day of procedure.  He has a  same-day work-up.  He will need same day COVID-19 testing as indicated due to transportation issues.   VS: Ht 5\' 6"  (1.676 m)   Wt 71.2 kg   BMI 25.34 kg/m  BP Readings from Last 3 Encounters:  09/25/21 (!) 132/56  09/12/21 (!) 144/60  09/10/21 (!) 126/50   Pulse Readings from Last 3 Encounters:  09/25/21 66  09/12/21 81  09/10/21 (!) 59     PROVIDERS: Clinic, Juan Louis, MD is EP cardiologist Juan Apo, MD is pulmonologist Juan Charon, MD is CT surgeon Juan Pray, MD RAD-ONC   LABS: Labs on day of procedure is indicated.  As of 08/31/2021, creatinine 1.10, Hgb 13.9, HCT 41, platelet 210, glucose 153, A1c 6.2%.   PFTs 08/26/21: FVC 2.82 (86%) FEV1 1.94 (79%), FEV1 1.71 (70%) postbronchodilator TLC 5.82 (93%) DLCO 11.88 (51%)    IMAGES: CT Super D Chest 10/03/21: IMPRESSION: - Pulmonary nodule with irregular margins amidst paraseptal emphysematous changes in the posterior RIGHT lower lobe measures 2.3 x 1.1 cm greatest axial dimension and 1.8 cm greatest craniocaudal dimension. This abuts the pleura in the medial RIGHT chest. This was shown to be hypermetabolic on PET compatible with bronchogenic neoplasm. - Small sub solid nodule in the superior segment of the RIGHT lower lobe. Possibility of additional site of disease is considered, this is adjacent to the dominant area described above. - Marked paraseptal emphysema is worse at the lung apices with moderate bullous changes. - Aortic Atherosclerosis (ICD10-I70.0) and Emphysema (ICD10-J43.9).  PET 09/02/21: IMPRESSION: 1. 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 2. Left  upper lobe hypermetabolism may correspond to vague ground-glass and could represent a focus of infection or inflammation. 3. Aortic atherosclerosis (ICD10-I70.0), coronary  artery atherosclerosis and emphysema (ICD10-J43.9).    EKG: 08/31/21: Atrial-sensed ventricular-paced rhythm Abnormal ECG Confirmed by Juan Norris [phone]) on 09/01/2021 11:00:38 AM   CV: Echo 09/23/21: IMPRESSIONS   1. Left ventricular ejection fraction, by estimation, is 25 to 30%. The  left ventricle has severely decreased function. The left ventricle  demonstrates global hypokinesis. Left ventricular diastolic parameters are  consistent with Grade I diastolic  dysfunction (impaired relaxation).   2. Right ventricular systolic function is normal. The right ventricular  size is mildly enlarged. There is normal pulmonary artery systolic  pressure. The estimated right ventricular systolic pressure is 89.3 mmHg.   3. Left atrial size was mildly dilated.   4. The mitral valve is grossly normal. Mild mitral valve regurgitation.  No evidence of mitral stenosis.   5. The aortic valve is tricuspid. There is mild calcification of the  aortic valve. There is mild thickening of the aortic valve. Aortic valve  regurgitation is moderate. Mild aortic valve sclerosis is present, with no  evidence of aortic valve stenosis.   6. The inferior vena cava is normal in size with greater than 50%  respiratory variability, suggesting right atrial pressure of 3 mmHg.  Comparison(s): No significant change from prior study. EF remains severely  reduced ~25%. AI is moderate.    ICD Implant 11/08/18: PROCEDURES:   1. Left upper extremity venography 2. Biventricular ICD implantation with MDT 7342 His bundle pacing lead in the LV port  CONCLUSIONS:  1. Nonischemic cardiomyopathy with Left bundle-branch block and chronic New York Heart Association class III heart failure.  2. Successful Medtronic biventricular ICD implantation with His Bundle pacing and a MDT 8768 lead placed into the LV port.  3. No early apparent complications.     Cardiac Catheterization 03/30/18: Juan Norris 2nd Mrg lesion is 40%  stenosed. Prox Cx to Mid Cx lesion is 20% stenosed. Prox LAD lesion is 10% stenosed.   1. Mild non-obstructive CAD. His presentation could be due to coronary vasospasm.  2. Normal LV filling pressure.  Recommendations: Medical management of mild CAD. Consider addition of a long acting nitrate or calcium channel blocker for possible spasm. Echo is planned today to assess LV function.   Past Medical History:  Diagnosis Date   Acute renal insufficiency    Anginal pain (HCC)    Chronic lower back pain    "due to 3 ruptured discs in my back" (11/08/2018)   High cholesterol    History of kidney stones 1982   "passed them"   Hypertension    Myocardial infarction ([place]) 10/5725   Systolic heart failure ([place])    Tobacco use     Past Surgical History:  Procedure Laterality Date   BIV ICD INSERTION CRT-D N/A 11/08/2018    Medtronic biventricular ICD implantation by Dr Rayann Heman with His Bundle pacing and a MDT 2035 lead placed into the LV port.    CARDIAC CATHETERIZATION     INGUINAL HERNIA REPAIR Bilateral    KNEE SURGERY Left 1990s   "growth on the back of my knee removed"   LEFT HEART CATH AND CORONARY ANGIOGRAPHY N/A 03/30/2018   Procedure: LEFT HEART CATH AND CORONARY ANGIOGRAPHY;  Surgeon: Burnell Blanks, MD;  Location: [place] [facility] LAB;  Service: Cardiovascular;  Laterality: N/A;    MEDICATIONS: No current facility-administered medications for this encounter.    aspirin  81 MG EC tablet   atorvastatin (LIPITOR) 40 MG tablet   carvedilol (COREG) 25 MG tablet   Multiple Vitamin (MULTIVITAMIN WITH MINERALS) TABS tablet   sacubitril-valsartan (ENTRESTO) 24-26 MG   spironolactone (ALDACTONE) 25 MG tablet   carvedilol (COREG) 6.25 MG tablet   Report instructions to hold aspirin 2 days prior to procedure.   Myra Gianotti, PA-C Surgical Short Stay/Anesthesiology [facility] Phone [phone] [facility] Phone [phone] 10/10/2021 3:52 PM

## 2021-10-10 NOTE — Anesthesia Preprocedure Evaluation (Addendum)
Anesthesia Evaluation  Patient identified by MRN, date of birth, ID band Patient awake    Reviewed: Allergy & Precautions, NPO status , Patient's Chart, lab work & pertinent test results  Airway Mallampati: II  TM Distance: >3 FB Neck ROM: Full    Dental  (+) Missing, Chipped, Loose, Poor Dentition,    Pulmonary Current Smoker and Patient abstained from smoking.,    Pulmonary exam normal breath sounds clear to auscultation       Cardiovascular hypertension, Pt. on home beta blockers and Pt. on medications + CAD, + Past MI and +CHF  Normal cardiovascular exam+ Cardiac Defibrillator (BiVICD)  Rhythm:Regular Rate:Normal  ECHO: Left ventricular ejection fraction, by estimation, is 25 to 30%. The left ventricle has severely decreased function. The left ventricle demonstrates global hypokinesis. Left ventricular diastolic parameters are consistent with Grade I diastolic dysfunction (impaired relaxation). Right ventricular systolic function is normal. The right ventricular size is mildly enlarged. There is normal pulmonary artery systolic pressure. The estimated right ventricular systolic pressure is 71.1 mmHg. 2. 3. Left atrial size was mildly dilated. The mitral valve is grossly normal. Mild mitral valve regurgitation. No evidence of mitral stenosis. The aortic valve is tricuspid. There is mild calcification of the aortic valve. There is mild thickening of the aortic valve. Aortic valve regurgitation is moderate. Mild aortic valve sclerosis is present, with no evidence of aortic valve stenosis. The inferior vena cava is normal in size with greater than 50% respiratory variability, suggesting right atrial pressure of 3 mmHg.   Neuro/Psych negative neurological ROS  negative psych ROS   GI/Hepatic negative GI ROS, Neg liver ROS,   Endo/Other  negative endocrine ROS  Renal/GU negative Renal ROS     Musculoskeletal negative  musculoskeletal ROS (+)   Abdominal   Peds  Hematology HLD   Anesthesia Other Findings LUNG NODULE  Reproductive/Obstetrics                         Anesthesia Physical Anesthesia Plan  ASA: 3  Anesthesia Plan: General   Post-op Pain Management:    Induction: Intravenous  PONV Risk Score and Plan: 1 and Ondansetron, Dexamethasone and Treatment may vary due to age or medical condition  Airway Management Planned: Oral ETT  Additional Equipment:   Intra-op Plan:   Post-operative Plan: Extubation in OR  Informed Consent: I have reviewed the patients History and Physical, chart, labs and discussed the procedure including the risks, benefits and alternatives for the proposed anesthesia with the patient or authorized representative who has indicated his/her understanding and acceptance.     Dental advisory given  Plan Discussed with: CRNA  Anesthesia Plan Comments: (Reviewed PAT note written 10/10/2021 by Myra Gianotti, PA-C. )      Anesthesia Quick Evaluation

## 2021-10-13 ENCOUNTER — Ambulatory Visit (HOSPITAL_COMMUNITY): Payer: No Typology Code available for payment source

## 2021-10-13 ENCOUNTER — Ambulatory Visit (HOSPITAL_COMMUNITY): Payer: No Typology Code available for payment source | Admitting: Vascular Surgery

## 2021-10-13 ENCOUNTER — Ambulatory Visit (HOSPITAL_COMMUNITY)
Admission: RE | Admit: 2021-10-13 | Discharge: 2021-10-13 | Disposition: A | Discharge status: Home / Self Care | Payer: No Typology Code available for payment source | Attending: Emergency Medicine | Admitting: Emergency Medicine

## 2021-10-13 ENCOUNTER — Encounter (HOSPITAL_COMMUNITY): Payer: Self-pay | Admitting: Emergency Medicine

## 2021-10-13 ENCOUNTER — Other Ambulatory Visit: Payer: Self-pay

## 2021-10-13 ENCOUNTER — Encounter (HOSPITAL_COMMUNITY)
Admission: RE | Disposition: A | Discharge status: Home / Self Care | Payer: Self-pay | Source: Home / Self Care | Attending: Emergency Medicine

## 2021-10-13 DIAGNOSIS — Z9889 Other specified postprocedural states: Secondary | ICD-10-CM

## 2021-10-13 DIAGNOSIS — Z7982 Long term (current) use of aspirin: Secondary | ICD-10-CM | POA: Diagnosis not present

## 2021-10-13 DIAGNOSIS — I252 Old myocardial infarction: Secondary | ICD-10-CM | POA: Diagnosis not present

## 2021-10-13 DIAGNOSIS — I2510 Atherosclerotic heart disease of native coronary artery without angina pectoris: Secondary | ICD-10-CM | POA: Insufficient documentation

## 2021-10-13 DIAGNOSIS — I700 Atherosclerosis of aorta: Secondary | ICD-10-CM | POA: Diagnosis not present

## 2021-10-13 DIAGNOSIS — C3431 Malignant neoplasm of lower lobe, right bronchus or lung: Secondary | ICD-10-CM | POA: Diagnosis not present

## 2021-10-13 DIAGNOSIS — Z79899 Other long term (current) drug therapy: Secondary | ICD-10-CM | POA: Diagnosis not present

## 2021-10-13 DIAGNOSIS — F17210 Nicotine dependence, cigarettes, uncomplicated: Secondary | ICD-10-CM | POA: Diagnosis not present

## 2021-10-13 DIAGNOSIS — I447 Left bundle-branch block, unspecified: Secondary | ICD-10-CM

## 2021-10-13 DIAGNOSIS — R911 Solitary pulmonary nodule: Secondary | ICD-10-CM | POA: Diagnosis present

## 2021-10-13 DIAGNOSIS — I080 Rheumatic disorders of both mitral and aortic valves: Secondary | ICD-10-CM | POA: Diagnosis not present

## 2021-10-13 DIAGNOSIS — Z20822 Contact with and (suspected) exposure to covid-19: Secondary | ICD-10-CM | POA: Insufficient documentation

## 2021-10-13 DIAGNOSIS — I110 Hypertensive heart disease with heart failure: Secondary | ICD-10-CM | POA: Insufficient documentation

## 2021-10-13 DIAGNOSIS — Z95810 Presence of automatic (implantable) cardiac defibrillator: Secondary | ICD-10-CM | POA: Insufficient documentation

## 2021-10-13 DIAGNOSIS — I5020 Unspecified systolic (congestive) heart failure: Secondary | ICD-10-CM | POA: Insufficient documentation

## 2021-10-13 DIAGNOSIS — Z419 Encounter for procedure for purposes other than remedying health state, unspecified: Secondary | ICD-10-CM

## 2021-10-13 DIAGNOSIS — E7800 Pure hypercholesterolemia, unspecified: Secondary | ICD-10-CM | POA: Insufficient documentation

## 2021-10-13 HISTORY — PX: BRONCHIAL NEEDLE ASPIRATION BIOPSY: SHX5106

## 2021-10-13 HISTORY — PX: FIDUCIAL MARKER PLACEMENT: SHX6858

## 2021-10-13 HISTORY — PX: VIDEO BRONCHOSCOPY WITH RADIAL ENDOBRONCHIAL ULTRASOUND: SHX6849

## 2021-10-13 HISTORY — PX: BRONCHIAL BIOPSY: SHX5109

## 2021-10-13 HISTORY — PX: BRONCHIAL BRUSHINGS: SHX5108

## 2021-10-13 HISTORY — PX: BRONCHIAL WASHINGS: SHX5105

## 2021-10-13 HISTORY — PX: VIDEO BRONCHOSCOPY WITH ENDOBRONCHIAL NAVIGATION: SHX6175

## 2021-10-13 LAB — BASIC METABOLIC PANEL
Anion gap: 8 (ref 5–15)
BUN: 20 mg/dL (ref 8–23)
CO2: 23 mmol/L (ref 22–32)
Calcium: 9.2 mg/dL (ref 8.9–10.3)
Chloride: 107 mmol/L (ref 98–111)
Creatinine, Ser: 1.08 mg/dL (ref 0.61–1.24)
GFR, Estimated: >60 mL/min (ref >60)
Glucose, Bld: 114 mg/dL — ABNORMAL HIGH (ref 70–99)
Potassium: 3.9 mmol/L (ref 3.5–5.1)
Sodium: 138 mmol/L (ref 135–145)

## 2021-10-13 LAB — CBC
HCT: 39.6 % (ref 39.0–52.0)
Hemoglobin: 13.3 g/dL (ref 13.0–17.0)
MCH: 30.7 pg (ref 26.0–34.0)
MCHC: 33.6 g/dL (ref 30.0–36.0)
MCV: 91.5 fL (ref 80.0–100.0)
Platelets: 171 10*3/uL (ref 150–400)
RBC: 4.33 MIL/uL (ref 4.22–5.81)
RDW: 13.2 % (ref 11.5–15.5)
WBC: 10.6 10*3/uL — ABNORMAL HIGH (ref 4.0–10.5)
nRBC: 0 % (ref 0.0–0.2)

## 2021-10-13 LAB — SARS CORONAVIRUS 2 BY RT PCR (HOSPITAL ORDER, PERFORMED IN ~~LOC~~ HOSPITAL LAB): SARS Coronavirus 2: NEGATIVE

## 2021-10-13 IMAGING — DX DG CHEST 1V PORT
1 series · 1 of 1 positions shown · non-contrast
Comparison: CT chest dated [DATE]. Chest x-ray dated
[DATE].

CLINICAL DATA: Status post bronchoscopy.

EXAM:
PORTABLE CHEST 1 VIEW

[chest ap]
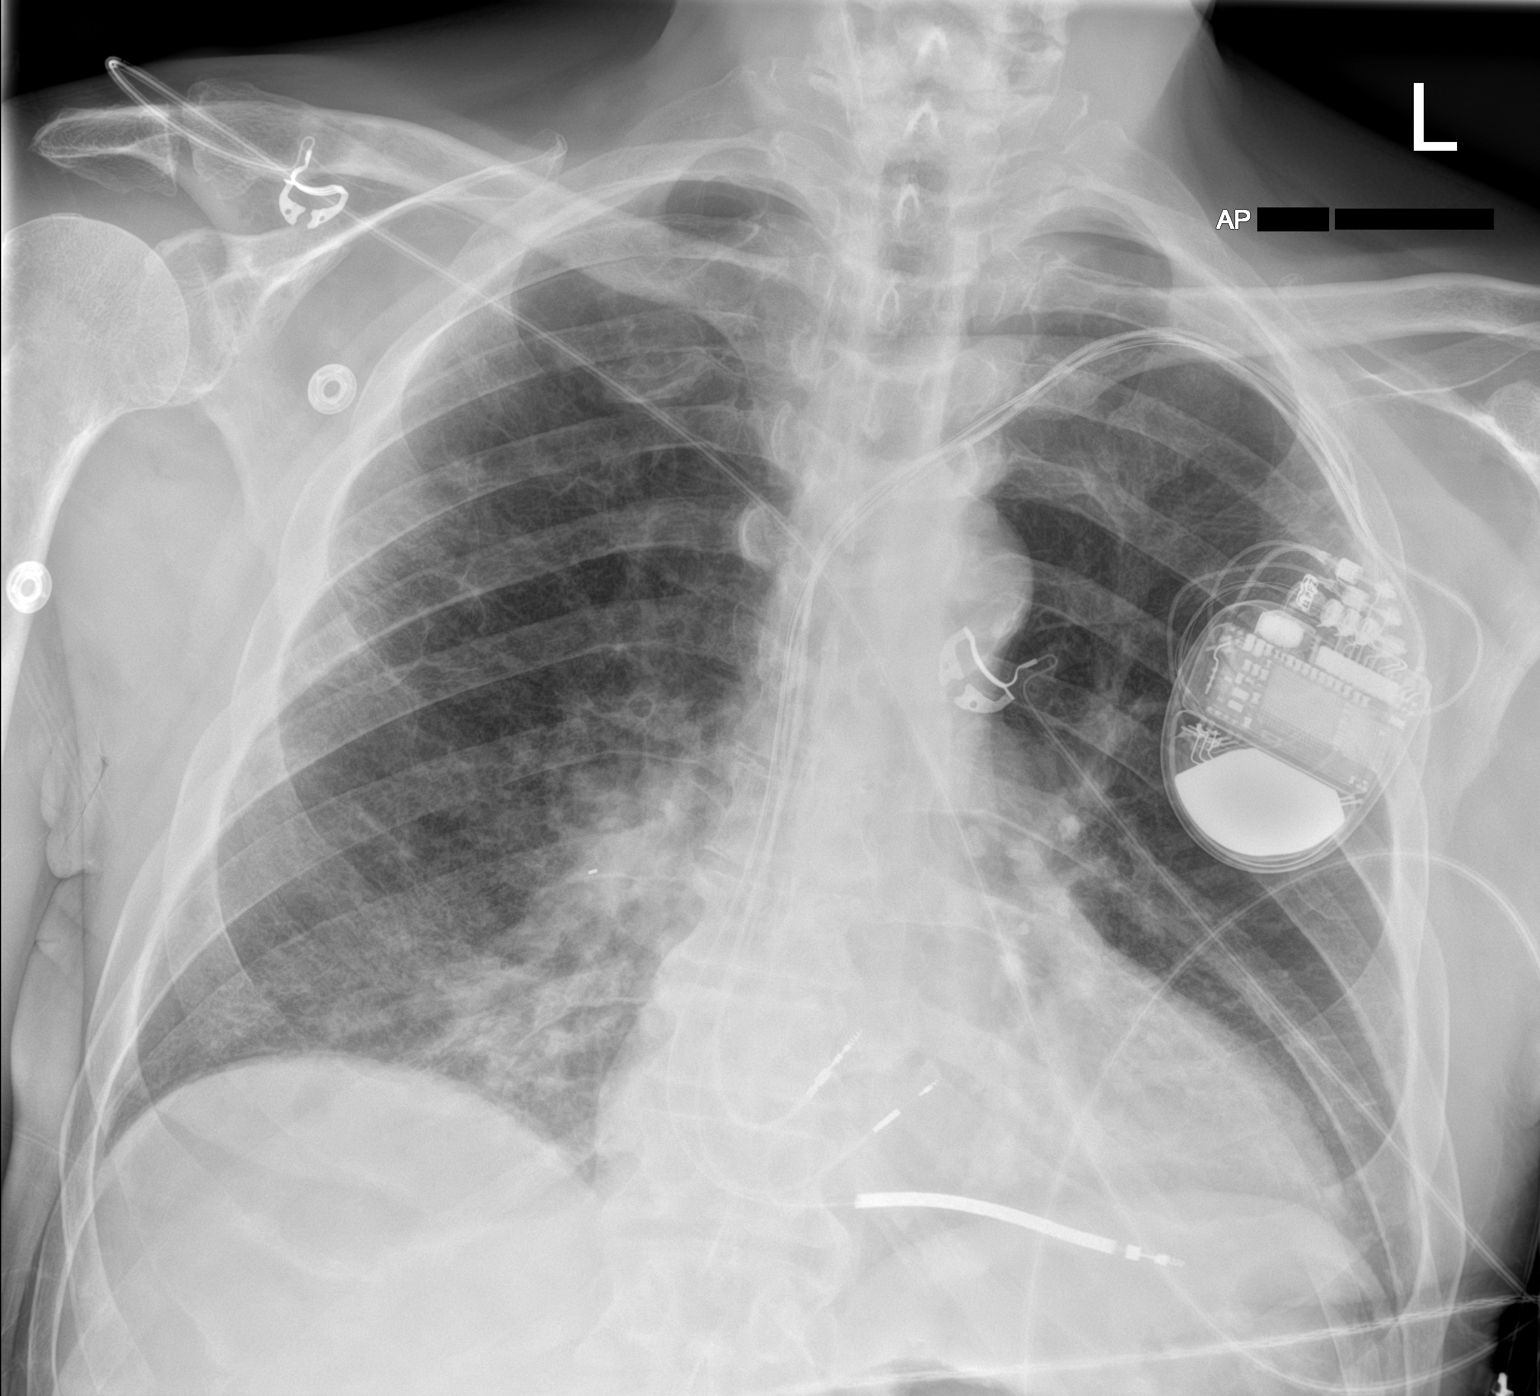

[1 of 1 positions shown; findings below may reference images not displayed]

FINDINGS: Unchanged left chest wall AICD. The heart size and mediastinal
contours are within normal limits. Patchy opacity in the medial
right lower lobe. Chronically coarsened interstitial markings. No
pleural effusion or pneumothorax. No acute osseous abnormality.
IMPRESSION: 1. Patchy opacity in the medial right lower lobe status post
bronchoscopy, likely post biopsy hemorrhage. No pneumothorax.

## 2021-10-13 SURGERY — VIDEO BRONCHOSCOPY WITH ENDOBRONCHIAL NAVIGATION
Anesthesia: General

## 2021-10-13 MED ORDER — CHLORHEXIDINE GLUCONATE 0.12 % MT SOLN: 15.0000 mL | Freq: Once | OROMUCOSAL | Status: AC

## 2021-10-13 MED ORDER — DEXAMETHASONE SODIUM PHOSPHATE 10 MG/ML IJ SOLN
INTRAMUSCULAR | Status: DC | PRN
  Administered 2021-10-13: 4 mg via INTRAVENOUS

## 2021-10-13 MED ORDER — ONDANSETRON HCL 4 MG/2ML IJ SOLN
INTRAMUSCULAR | Status: DC | PRN
  Administered 2021-10-13: 4 mg via INTRAVENOUS

## 2021-10-13 MED ORDER — ATORVASTATIN CALCIUM 40 MG PO TABS
40.0000 mg | ORAL_TABLET | Freq: Every day | ORAL | Status: AC
Start: 2021-10-13 — End: ?

## 2021-10-13 MED ORDER — SUGAMMADEX SODIUM 200 MG/2ML IV SOLN
INTRAVENOUS | Status: DC | PRN
  Administered 2021-10-13 (×2): 100 mg via INTRAVENOUS

## 2021-10-13 MED ORDER — CHLORHEXIDINE GLUCONATE 0.12 % MT SOLN
OROMUCOSAL | Status: AC
  Administered 2021-10-13: 15 mL via OROMUCOSAL
  Filled 2021-10-13: qty 15

## 2021-10-13 MED ORDER — LACTATED RINGERS IV SOLN: INTRAVENOUS | Status: DC

## 2021-10-13 MED ORDER — FENTANYL CITRATE (PF) 100 MCG/2ML IJ SOLN
INTRAMUSCULAR | Status: DC | PRN
  Administered 2021-10-13: 50 ug via INTRAVENOUS

## 2021-10-13 MED ORDER — LIDOCAINE 2% (20 MG/ML) 5 ML SYRINGE
INTRAMUSCULAR | Status: DC | PRN
  Administered 2021-10-13: 100 mg via INTRAVENOUS

## 2021-10-13 MED ORDER — EPHEDRINE SULFATE-NACL 50-0.9 MG/10ML-% IV SOSY
PREFILLED_SYRINGE | INTRAVENOUS | Status: DC | PRN
  Administered 2021-10-13: 5 mg via INTRAVENOUS

## 2021-10-13 MED ORDER — ROCURONIUM BROMIDE 10 MG/ML (PF) SYRINGE
PREFILLED_SYRINGE | INTRAVENOUS | Status: DC | PRN
  Administered 2021-10-13: 30 mg via INTRAVENOUS
  Administered 2021-10-13: 70 mg via INTRAVENOUS

## 2021-10-13 MED ORDER — PROPOFOL 10 MG/ML IV BOLUS
INTRAVENOUS | Status: DC | PRN
  Administered 2021-10-13: 70 mg via INTRAVENOUS

## 2021-10-13 MED ORDER — PHENYLEPHRINE 40 MCG/ML (10ML) SYRINGE FOR IV PUSH (FOR BLOOD PRESSURE SUPPORT)
PREFILLED_SYRINGE | INTRAVENOUS | Status: DC | PRN
  Administered 2021-10-13 (×2): 80 ug via INTRAVENOUS

## 2021-10-13 SURGICAL SUPPLY — 1 items: superlock fiducial marker ×1 IMPLANT

## 2021-10-13 NOTE — Op Note (Signed)
Video Bronchoscopy with Robotic Assisted Bronchoscopic Navigation   Date of Operation: 10/13/2021   Pre-op Diagnosis: Right lower lobe pulmonary nodule  Post-op Diagnosis: Same  Surgeon: Baltazar Apo  Assistants: None  Anesthesia: General endotracheal anesthesia  Operation: Flexible video fiberoptic bronchoscopy with robotic assistance and biopsies.  Estimated Blood Loss: Minimal  Complications: None  Indications and History: Juan Norris is a 70 y.o. male with history of history tobacco use.  Found to have a right lower lobe superior segmental pulmonary nodule suspicious for malignancy.  Recommendation was made to achieve a tissue diagnosis via navigational bronchoscopy with biopsies. The risks, benefits, complications, treatment options and expected outcomes were discussed with the patient.  The possibilities of pneumothorax, pneumonia, reaction to medication, pulmonary aspiration, perforation of a viscus, bleeding, failure to diagnose a condition and creating a complication requiring transfusion or operation were discussed with the patient who freely signed the consent.    Description of Procedure: The patient was seen in the Preoperative Area, was examined and was deemed appropriate to proceed.  The patient was taken to [facility] endoscopy room 3, identified as [name] and the procedure verified as Flexible Video Fiberoptic Bronchoscopy.  A Time Out was held and the above information confirmed.   Prior to the date of the procedure a high-resolution CT scan of the chest was performed. Utilizing ION software program a virtual tracheobronchial tree was generated to allow the creation of distinct navigation pathways to the patient's parenchymal abnormalities. After being taken to the operating room general anesthesia was initiated and the patient  was orally intubated. The video fiberoptic bronchoscope was introduced via the endotracheal tube and a general inspection was performed which showed  normal right and left lung anatomy, aspiration of the bilateral mainstems was completed to remove any remaining secretions. Robotic catheter inserted into patient's endotracheal tube.   Target #1 right lower lobe pulmonary nodule: The distinct navigation pathways prepared prior to this procedure were then utilized to navigate to patient's lesion identified on CT scan. The robotic catheter was secured into place and the vision probe was withdrawn.  Lesion location was approximated using fluoroscopy and radial endobronchial ultrasound for peripheral targeting.  Local targeting then performed using [employer]ios three-dimensional imaging.  Under fluoroscopic guidance transbronchial needle brushings, transbronchial needle biopsies, and transbronchial forceps biopsies were performed to be sent for cytology and pathology.  A single fiducial marker was placed adjacent to the nodule.  Bronchioalveolar lavage was performed in the superior segment of the right lower lobe and sent for cytology.  At the end of the procedure a general airway inspection was performed and there was no evidence of active bleeding. The bronchoscope was removed.  The patient tolerated the procedure well. There was no significant blood loss and there were no obvious complications. A post-procedural chest x-ray is pending.  Samples Target #1: 1. Transbronchial needle brushings from right lower lobe pulmonary nodule 2. Transbronchial Wang needle biopsies from right lower lobe pulmonary nodule 3. Transbronchial forceps biopsies from right lower lobe pulmonary nodule 4. Bronchoalveolar lavage from right lower lobe superior segment  Plans:  The patient will be discharged from the PACU to home when recovered from anesthesia and after chest x-ray is reviewed. We will review the cytology, pathology and microbiology results with the patient when they become available. Outpatient followup will be with Dr Lamonte Sakai.   Baltazar Apo, MD, PhD 10/13/2021, 9:01  AM [facility] [phone] or if no answer before 7:00PM call [phone] For  any issues after 7:00PM please call eLink [phone]

## 2021-10-13 NOTE — Discharge Instructions (Signed)
Flexible Bronchoscopy, Care After This sheet gives you information about how to care for yourself after your test. Your doctor may also give you more specific instructions. If you have problems or questions, contact your doctor. Follow these instructions at home: Eating and drinking Do not eat or drink anything (not even water) for 2 hours after your test, or until your numbing medicine (local anesthetic) wears off. When your numbness is gone and your cough and gag reflexes have come back, you may: Eat only soft foods. Slowly drink liquids. The day after the test, go back to your normal diet. Driving Do not drive for 24 hours if you were given a medicine to help you relax (sedative). Do not drive or use heavy machinery while taking prescription pain medicine. General instructions  Take over-the-counter and prescription medicines only as told by your doctor. Return to your normal activities as told. Ask what activities are safe for you. Do not use any products that have nicotine or tobacco in them. This includes cigarettes and e-cigarettes. If you need help quitting, ask your doctor. Keep all follow-up visits as told by your doctor. This is important. It is very important if you had a tissue sample (biopsy) taken. Get help right away if: You have shortness of breath that gets worse. You get light-headed. You feel like you are going to [place] out (faint). You have chest pain. You cough up: More than a little blood. More blood than before. Summary Do not eat or drink anything (not even water) for 2 hours after your test, or until your numbing medicine wears off. Do not use cigarettes. Do not use e-cigarettes. Get help right away if you have chest pain.  Please call our office for any questions or concerns.  [phone].  This information is not intended to replace advice given to you by your health care provider. Make sure you discuss any questions you have with your health care  provider. Document Released: 09/27/2009 Document Revised: 11/12/2017 Document Reviewed: 12/18/2016 Elsevier Patient Education  2020 [employer].

## 2021-10-13 NOTE — Anesthesia Procedure Notes (Signed)
Procedure Name: Intubation Date/Time: 10/13/2021 7:46 AM Performed by: Trinna Post., CRNA Pre-anesthesia Checklist: Patient identified, Emergency Drugs available, Suction available, Patient being monitored and Timeout performed Patient Re-evaluated:Patient Re-evaluated prior to induction Oxygen Delivery Method: Circle system utilized Preoxygenation: Pre-oxygenation with 100% oxygen Induction Type: IV induction Ventilation: Mask ventilation without difficulty Laryngoscope Size: Mac and 4 Grade View: Grade II Tube type: Oral Tube size: 8.5 mm Number of attempts: 1 Airway Equipment and Method: Stylet Placement Confirmation: ETT inserted through vocal cords under direct vision, positive ETCO2 and breath sounds checked- equal and bilateral Secured at: 23 cm Tube secured with: Tape Dental Injury: Teeth and Oropharynx as per pre-operative assessment

## 2021-10-13 NOTE — Interval H&P Note (Signed)
History and Physical Interval Note:  10/13/2021 6:46 AM  Juan Norris  has presented today for surgery, with the diagnosis of LUNG NODULE.  The various methods of treatment have been discussed with the patient and family. After consideration of risks, benefits and other options for treatment, the patient has consented to  Procedure(s): ROBOTIC [place] (N/A) as a surgical intervention.  [name] history has been reviewed, patient examined, no change in status, stable for surgery.  I have reviewed the patient's chart and labs.  Questions were answered to the patient's satisfaction.     Collene Gobble

## 2021-10-13 NOTE — Transfer of Care (Signed)
Immediate Anesthesia Transfer of Care Note  Patient: Juan Norris  Procedure(s) Performed: ROBOTIC VIDEO BRONCHOSCOPY WITH ENDOBRONCHIAL NAVIGATION BRONCHIAL BRUSHINGS BRONCHIAL NEEDLE ASPIRATION BIOPSIES BRONCHIAL BIOPSIES RADIAL ENDOBRONCHIAL ULTRASOUND FIDUCIAL MARKER PLACEMENT BRONCHIAL WASHINGS  Patient Location: PACU  Anesthesia Type:General  Level of Consciousness: awake, alert  and oriented  Airway & Oxygen Therapy: Patient Spontanous Breathing and Patient connected to face mask oxygen  Post-op Assessment: Report given to RN and Post -op Vital signs reviewed and stable  Post vital signs: Reviewed and stable  Last Vitals:  Vitals Value Taken Time  BP 118/44 10/13/21 0912  Temp 36.8 C 10/13/21 0911  Pulse 64 10/13/21 0914  Resp 28 10/13/21 0914  SpO2 95 % 10/13/21 0914  Vitals shown include unvalidated device data.  Last Pain:  Vitals:   10/13/21 0911  TempSrc:   PainSc: 0-No pain      Patients Stated Pain Goal: 3 (38/[id number]/19 [id number])  Complications: No notable events documented.

## 2021-10-13 NOTE — Anesthesia Postprocedure Evaluation (Signed)
Anesthesia Post Note  Patient: Juan Norris  Procedure(s) Performed: ROBOTIC VIDEO BRONCHOSCOPY WITH ENDOBRONCHIAL NAVIGATION BRONCHIAL BRUSHINGS BRONCHIAL NEEDLE ASPIRATION BIOPSIES BRONCHIAL BIOPSIES RADIAL ENDOBRONCHIAL ULTRASOUND FIDUCIAL MARKER PLACEMENT BRONCHIAL WASHINGS     Patient location during evaluation: PACU Anesthesia Type: General Level of consciousness: awake Pain management: pain level controlled Vital Signs Assessment: post-procedure vital signs reviewed and stable Respiratory status: spontaneous breathing, nonlabored ventilation, respiratory function stable and patient connected to nasal cannula oxygen Cardiovascular status: blood pressure returned to baseline and stable Postop Assessment: no apparent nausea or vomiting Anesthetic complications: no   No notable events documented.  Last Vitals:  Vitals:   10/13/21 0925 10/13/21 0940  BP: (!) 111/53 (!) 114/49  Pulse: (!) 55 (!) 59  Resp: (!) 21 16  Temp:  36.8 C  SpO2: 95% 95%    Last Pain:  Vitals:   10/13/21 0940  TempSrc:   PainSc: 0-No pain                 Jameir Ake P Sorah Falkenstein

## 2021-10-14 ENCOUNTER — Ambulatory Visit: Payer: Medicare HMO | Admitting: Thoracic Surgery (Cardiothoracic Vascular Surgery)

## 2021-10-14 LAB — CYTOLOGY - NON PAP

## 2021-10-15 ENCOUNTER — Encounter: Payer: Self-pay | Admitting: *Deleted

## 2021-10-15 ENCOUNTER — Encounter (HOSPITAL_COMMUNITY): Payer: Self-pay | Admitting: Emergency Medicine

## 2021-10-15 ENCOUNTER — Telehealth: Payer: Self-pay | Admitting: Emergency Medicine

## 2021-10-15 DIAGNOSIS — R911 Solitary pulmonary nodule: Secondary | ICD-10-CM

## 2021-10-15 LAB — CYTOLOGY - NON PAP

## 2021-10-15 NOTE — Telephone Encounter (Signed)
Discussed biopsy results with the patient by phone.  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  He is interested in pursuing SBRT.  He has seen Dr. Sondra Come to learn about the treatment and wants to be referred back.    I will forward this FYI to Norton Blizzard, coordinator for Thoracic Oncology

## 2021-10-15 NOTE — Progress Notes (Signed)
I received a message from Dr. Lamonte Sakai. He states patient would like to see Dr. Sondra Come again about possible XRT treatment.  I notified rad onc scheduling team to arrange an appt for patient.

## 2021-10-16 ENCOUNTER — Telehealth: Payer: Self-pay

## 2021-10-16 NOTE — Progress Notes (Signed)
Location of tumor and Histology per Pathology Report: RLL lung Diagnostic Tests: NUCLEAR MEDICINE PET SKULL BASE TO THIGH   IMPRESSION: 1. 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   Biopsy:      Past/Anticipated interventions by surgeon, if any:   a wedge resection and then proceed with lobectomy if the frozen section is positive for cancer. Surgeon: Baltazar Apo Operation: Flexible video fiberoptic bronchoscopy with robotic assistance and biopsies.    Past/Anticipated interventions by medical oncology, if any: none at this time       Pain issues, if any:  yes, 7/10 chronic back pain, constant and aching   SAFETY ISSUES: Prior radiation?  no Pacemaker/ICD? yes Possible current pregnancy? no Is the patient on methotrexate? no   Current Complaints / other details:

## 2021-10-16 NOTE — Telephone Encounter (Signed)
Request for device clearance prior to Radiation received.  Site to be treated is noted as Right lung.  Patient device located in left pectoral region.  Left VM for callback to confirm that site to be treated is >10cm from device/ leads.

## 2021-10-17 DIAGNOSIS — C3431 Malignant neoplasm of lower lobe, right bronchus or lung: Secondary | ICD-10-CM | POA: Insufficient documentation

## 2021-10-19 NOTE — Progress Notes (Signed)
Radiation Oncology         [phone] ________________________________  Name: Juan Norris MRN: [id number]  Date: 10/20/2021  DOB: 25-Feb-1951  Follow-Up New Visit Note  CC: [facility], [facility], MD  REFERRING PHYSICIAN: [facility]     ICD-10-CM   1. Malignant neoplasm of bronchus of right lower lobe [facility])  C34.31      Diagnosis:   The encounter diagnosis was Pulmonary nodule.   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 To review from his initial consultation on 09/10/21, we discussed that his treatment plan would be based on further input from pulmonary medicine and cardiothoracic surgery. The patient met with cardiology and was cleared on 09/12/21 to proceed with bronchoscopy, and accordingly met with Dr. Lamonte Sakai on 09/25/21 to discuss this further.   Super D-chest CT on 10/03/21 demonstrated the pulmonary nodule with irregular margins in the posterior right lower lobe amidst paraseptal emphysematous changes, measuring 2.3 x 1.1 cm in the greatest axial dimension, and 1.8 cm in the greatest craniocaudal dimension. The nodule was seen to abut the pleura in the medial right chest. RLL nodule was shown to be hypermetabolic on PET; compatible with bronchogenic neoplasm.  The patient opted to proceed with bronchoscopy and bronchial biopsies on 10/13/21 under the care of Dr. Lamonte Sakai. Pathology from the procedure revealed malignant cells present consistent with non-small cell carcinoma of the RLL (squamous cell carcinoma). Right lung lavage revealed no malignant cells identified.  CXR performed post-bronchoscopy on 10/13/21 showed a patchy opacity in the medial right lower lobe, noted as likely a post biopsy hemorrhage. No evidence of pneumothorax appreciated.   Of note: Echocardiogram on  09/23/21 showed a left ventricular EF of around 25 to 30%, indicative of severely decreased LV function and global hypokinesis. Left ventricular diastolic parameters noted as consistent with Grade I diastolic dysfunction (impaired relaxation). RV systolic function was normal, though the RV was noted as mildly enlarged. Mild thickening of the aortic valve noted as well.   HISTORY OF PRESENT ILLNESS: The patient presented to the [place] for lung cancer screening CT on 07/30/21 which demonstrated a 2.2 x 1.2 solid superior right lower lobe nodule which increased in size from 7 mm on his previous scan from 01/29/2021. Nodule was noted to appear very suspicious for primary lung cancer based on interval increase in size over 6 months.   Accordingly, the patient was referred to Dr. Lamonte Sakai on 08/20/21 for further evaluation. During this visit, Dr. Lamonte Sakai recommended pulmonary function testing to determine if the patient is a good candidate for resectioning. If he has marginal lung function, then Dr. Lamonte Sakai would plan for navigational bronchoscopy to get a tissue diagnosis and hopefully facilitate SBRT.   Pulmonary function testing 08/26/2021 FVC 2.82 (86%) FEV1 1.94 (79%), FEV1 1.71 (70%) postbronchodilator TLC 5.82 (93%) DLCO 11.88 (51%)   PET performed on 09/02/21 further demonstrated the hypermetabolic right lower lobe pulmonary nodule, as consistent with primary bronchogenic carcinoma (with presumed non-small cell histology). Also seen was a left upper lobe hypermetabolism; noted to possible correspond with vague ground-glass.    Following these findings, the patient was referred to Dr. Roxan Hockey for discussion of surgical interventions on 09/04/21. The patient expressed interest in pursuing surgical resection even with the understanding that it would be a high risk procedure.  Since  the patient initially met with Dr. Gorden Harms  he has reconsidered and wishes to proceed with definitive treatment using radiation  therapy techniques (SBRT)   Of note; the patient presented to the [facility] ED on 08/31/21 as a code stroke from triage with concern for altered sensation in the left lower leg which started that same morning. Patient states he was sitting in church when he suddenly felt a shooting pain in his left lower leg subsequently followed by complete numbness of the leg and sensation of weakness, and inability to walk on it. Patient also stated that he has a history of surgery behind the left knee to remove a mass in the past and has experienced the symptoms before, however not as severe as they were upon presentation to the ED. 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 The patient additionally reported facial twitching which prompted MRI of the brain and lumbar spine. Findings from both MRIs revealed no acute abnormalities and the patient was discharged that same day.                    Allergies:  has No Known Allergies.  Meds: Current Outpatient Medications  Medication Sig Dispense Refill   aspirin 81 MG EC tablet Take 1 tablet (81 mg total) by mouth daily. 30 tablet 6   atorvastatin (LIPITOR) 40 MG tablet Take 1 tablet (40 mg total) by mouth at bedtime.     carvedilol (COREG) 25 MG tablet Take 12.5 mg by mouth daily.     Multiple Vitamin (MULTIVITAMIN WITH MINERALS) TABS tablet Take 1 tablet by mouth daily.     sacubitril-valsartan (ENTRESTO) 24-26 MG Take 1 tablet by mouth 2 (two) times daily. Please make overdue appt with Dr. Meda Coffee before anymore refills. 1st attempt 60 tablet 0   spironolactone (ALDACTONE) 25 MG tablet Take 0.5 tablets (12.5 mg total) by mouth daily. 15 tablet 5   No current facility-administered medications for this encounter.    Physical  Findings: The patient is in no acute distress. Patient is alert and oriented.  height is _0  (1.676 m) and weight is 146 lb (66.2 kg). His temporal temperature is 97.6 F (36.4 C). His blood pressure is 132/46 (abnormal) and his pulse is 63. His respiration is 18 and oxygen saturation is 99%. .  Lungs are clear to auscultation bilaterally. Heart has regular rate and rhythm. No palpable cervical, supraclavicular, or axillary adenopathy. Abdomen soft, non-tender, normal bowel sounds.    Lab Findings: Lab Results  Component Value Date   WBC 10.6 (H) 10/13/2021   HGB 13.3 10/13/2021   HCT 39.6 10/13/2021   MCV 91.5 10/13/2021   PLT 171 10/13/2021    Radiographic Findings: DG Chest Port 1 View  Result Date: 10/13/2021 CLINICAL DATA:  Status post bronchoscopy. EXAM: PORTABLE CHEST 1 VIEW COMPARISON:  CT chest dated October 03, 2021. Chest x-ray dated November 09, 2018. FINDINGS: Unchanged left chest wall AICD. The heart size and mediastinal contours are within normal limits. Patchy opacity in the medial right lower lobe. Chronically coarsened interstitial markings. No pleural effusion or pneumothorax. No acute osseous abnormality. IMPRESSION: 1. Patchy opacity in the medial right lower lobe status post bronchoscopy, likely post biopsy hemorrhage. No pneumothorax. Electronically Signed   By: Huntley Dec  Derry M.D.   On: 10/13/2021 09:37   ECHOCARDIOGRAM COMPLETE  Result Date: 09/23/2021    ECHOCARDIOGRAM REPORT   Patient Name:   CLENNON NASCA Speakman   Date of Exam: 09/23/2021 Medical Rec #:  [id number]     Height:       67.0 in Accession #:    [id number]    Weight:       155.4 lb Date of Birth:  04-Nov-1951     BSA:          1.817 m Patient Age:    47 years      BP:           144/60 mmHg Patient Gender: M             HR:           60 bpm. Exam Location:  [facility] Procedure: 2D Echo, Cardiac Doppler, Color Doppler and Intracardiac            Opacification Agent Indications:    I50.9 Congestive heart  failure  History:        Patient has prior history of Echocardiogram examinations, most                 recent 12/29/2018. Previous Myocardial Infarction; Risk                 Factors:Hypertension and Current Smoker. Systolic heart failure.  Sonographer:    Cresenciano Lick RDCS Referring Phys: [id number] [place]  1. Left ventricular ejection fraction, by estimation, is 25 to 30%. The left ventricle has severely decreased function. The left ventricle demonstrates global hypokinesis. Left ventricular diastolic parameters are consistent with Grade I diastolic dysfunction (impaired relaxation).  2. Right ventricular systolic function is normal. The right ventricular size is mildly enlarged. There is normal pulmonary artery systolic pressure. The estimated right ventricular systolic pressure is 61.2 mmHg.  3. Left atrial size was mildly dilated.  4. The mitral valve is grossly normal. Mild mitral valve regurgitation. No evidence of mitral stenosis.  5. The aortic valve is tricuspid. There is mild calcification of the aortic valve. There is mild thickening of the aortic valve. Aortic valve regurgitation is moderate. Mild aortic valve sclerosis is present, with no evidence of aortic valve stenosis.  6. The inferior vena cava is normal in size with greater than 50% respiratory variability, suggesting right atrial pressure of 3 mmHg. Comparison(s): No significant change from prior study. EF remains severely reduced ~25%. AI is moderate. FINDINGS  Left Ventricle: Left ventricular ejection fraction, by estimation, is 25 to 30%. The left ventricle has severely decreased function. The left ventricle demonstrates global hypokinesis. Definity contrast agent was given IV to delineate the left ventricular endocardial borders. The left ventricular internal cavity size was normal in size. There is no left ventricular hypertrophy. Abnormal (paradoxical) septal motion, consistent with RV pacemaker. Left  ventricular diastolic parameters are consistent with Grade I diastolic dysfunction (impaired relaxation). Right Ventricle: The right ventricular size is mildly enlarged. No increase in right ventricular wall thickness. Right ventricular systolic function is normal. There is normal pulmonary artery systolic pressure. The tricuspid regurgitant velocity is 2.51  m/s, and with an assumed right atrial pressure of 3 mmHg, the estimated right ventricular systolic pressure is 24.4 mmHg. Left Atrium: Left atrial size was mildly dilated. Right Atrium: Right atrial size was normal in size. Pericardium: There is no evidence of pericardial effusion. Mitral Valve: The mitral valve is grossly normal. Mild mitral  valve regurgitation. No evidence of mitral valve stenosis. Tricuspid Valve: The tricuspid valve is grossly normal. Tricuspid valve regurgitation is trivial. No evidence of tricuspid stenosis. Aortic Valve: The aortic valve is tricuspid. There is mild calcification of the aortic valve. There is mild thickening of the aortic valve. Aortic valve regurgitation is moderate. Aortic regurgitation PHT measures 452 msec. Mild aortic valve sclerosis is  present, with no evidence of aortic valve stenosis. Aortic valve mean gradient measures 7.8 mmHg. Aortic valve peak gradient measures 14.3 mmHg. Aortic valve area, by VTI measures 1.75 cm. Pulmonic Valve: The pulmonic valve was grossly normal. Pulmonic valve regurgitation is trivial. No evidence of pulmonic stenosis. Aorta: The aortic root and ascending aorta are structurally normal, with no evidence of dilitation. Venous: The inferior vena cava is normal in size with greater than 50% respiratory variability, suggesting right atrial pressure of 3 mmHg. IAS/Shunts: The atrial septum is grossly normal. Additional Comments: A device lead is visualized in the right atrium and right ventricle.  LEFT VENTRICLE PLAX 2D LVIDd:         5.30 cm   Diastology LVIDs:         3.90 cm   LV e'  medial:    5.77 cm/s LV PW:         1.10 cm   LV E/e' medial:  14.0 LV IVS:        1.10 cm   LV e' lateral:   8.16 cm/s LVOT diam:     2.10 cm   LV E/e' lateral: 9.9 LV SV:         80 LV SV Index:   44 LVOT Area:     3.46 cm  RIGHT VENTRICLE             IVC RV Basal diam:  4.10 cm     IVC diam: 1.00 cm RV S prime:     17.35 cm/s TAPSE (M-mode): 2.8 cm LEFT ATRIUM             Index        RIGHT ATRIUM           Index LA diam:        4.30 cm 2.37 cm/m   RA Area:     14.90 cm LA Vol (A2C):   66.7 ml 36.72 ml/m  RA Volume:   43.40 ml  23.89 ml/m LA Vol (A4C):   63.6 ml 35.01 ml/m LA Biplane Vol: 66.1 ml 36.39 ml/m  AORTIC VALVE AV Area (Vmax):    1.87 cm AV Area (Vmean):   1.86 cm AV Area (VTI):     1.75 cm AV Vmax:           189.00 cm/s AV Vmean:          127.500 cm/s AV VTI:            0.457 m AV Peak Grad:      14.3 mmHg AV Mean Grad:      7.8 mmHg LVOT Vmax:         102.00 cm/s LVOT Vmean:        68.300 cm/s LVOT VTI:          0.230 m LVOT/AV VTI ratio: 0.50 AI PHT:            452 msec  AORTA Ao Root diam: 3.30 cm Ao Asc diam:  3.60 cm MITRAL VALVE  TRICUSPID VALVE MV Area (PHT): 1.50 cm     TR Peak grad:   25.2 mmHg MV Decel Time: 507 msec     TR Vmax:        251.00 cm/s MV E velocity: 80.90 cm/s MV A velocity: 133.00 cm/s  SHUNTS MV E/A ratio:  0.61         Systemic VTI:  0.23 m                             Systemic Diam: 2.10 cm Eleonore Chiquito MD Electronically signed by Eleonore Chiquito MD Signature Date/Time: 09/23/2021/2:01:51 PM    Final    CT Super D Chest Wo Contrast  Result Date: 10/03/2021 CLINICAL DATA:  A 70 year old male presents for evaluation of a pulmonary lesion. EXAM: CT CHEST WITHOUT CONTRAST TECHNIQUE: Multidetector CT imaging of the chest was performed using thin slice collimation for electromagnetic bronchoscopy planning purposes, without intravenous contrast. COMPARISON:  July 30, 2021. FINDINGS: Cardiovascular: Calcified atheromatous plaque of the thoracic aorta.  No aneurysmal dilation. Normal caliber of central pulmonary vasculature. Normal heart size. Limited assessment of cardiovascular structures given lack of intravenous contrast. Mediastinum/Nodes: No thoracic inlet, axillary, mediastinal or hilar adenopathy. Esophagus grossly normal. Cardiac pacer defibrillator with power pack over the LEFT chest with streak artifact at the LEFT thoracic inlet. Unchanged from previous imaging. Lungs/Pleura: Pulmonary nodule with irregular margins amidst paraseptal emphysematous changes in the posterior RIGHT lower lobe measures 2.3 x 1.1 cm greatest axial dimension and 1.8 cm greatest craniocaudal dimension. This abuts the pleura in the medial RIGHT chest. Marked paraseptal emphysema is worse at the lung apices with moderate bullous changes. No consolidation or effusion. Airways are patent. Small sub solid nodule in the superior segment of the RIGHT lower lobe (image 68/3) Upper Abdomen: Incidental imaging of upper abdominal contents without acute process. Granulomatous changes present in the spleen. Musculoskeletal: No acute bone finding. No destructive bone process. Spinal degenerative changes. IMPRESSION: Pulmonary nodule with irregular margins amidst paraseptal emphysematous changes in the posterior RIGHT lower lobe measures 2.3 x 1.1 cm greatest axial dimension and 1.8 cm greatest craniocaudal dimension. This abuts the pleura in the medial RIGHT chest. This was shown to be hypermetabolic on PET compatible with bronchogenic neoplasm. Small sub solid nodule in the superior segment of the RIGHT lower lobe. Possibility of additional site of disease is considered, this is adjacent to the dominant area described above. Marked paraseptal emphysema is worse at the lung apices with moderate bullous changes. Aortic Atherosclerosis (ICD10-I70.0) and Emphysema (ICD10-J43.9). Electronically Signed   By: Zetta Bills M.D.   On: 10/03/2021 14:42   DG C-ARM BRONCHOSCOPY  Result Date:  10/13/2021 C-ARM BRONCHOSCOPY: Fluoroscopy was utilized by the requesting physician.  No radiographic interpretation.    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  He does understand that there is high chance for cure with surgical intervention but patient is concerned with his medical issues that he may have problems during or after his surgery.   Plan: Patient scheduled for CT simulation on November 10 with treatments to begin approximately a week later.  I anticipate 3 SBRT treatments directed at the solitary nodule in the right lower lobe.   35 minutes of total time was spent for this patient encounter, including preparation,  face-to-face counseling with the patient and coordination of care, physical exam, and documentation of the encounter. ____________________________________  Blair Promise, PhD, MD   This document serves as a record of services personally performed by Gery Pray, MD. It was created on his behalf by Roney Mans, a trained medical scribe. The creation of this record is based on the scribe's personal observations and the provider's statements to them. This document has been checked and approved by the attending provider.

## 2021-10-20 ENCOUNTER — Ambulatory Visit
Admission: RE | Admit: 2021-10-20 | Discharge: 2021-10-20 | Disposition: A | Discharge status: Home / Self Care | Payer: Medicare HMO | Source: Ambulatory Visit | Attending: Radiation Oncology | Admitting: Radiation Oncology

## 2021-10-20 ENCOUNTER — Ambulatory Visit (INDEPENDENT_AMBULATORY_CARE_PROVIDER_SITE_OTHER): Payer: Medicare HMO

## 2021-10-20 ENCOUNTER — Other Ambulatory Visit: Payer: Self-pay

## 2021-10-20 ENCOUNTER — Encounter: Payer: Self-pay | Admitting: Radiation Oncology

## 2021-10-20 VITALS — BP 132/46 | HR 63 | Temp 97.6°F | Resp 18 | Ht 66.0 in | Wt 146.0 lb

## 2021-10-20 DIAGNOSIS — Z79899 Other long term (current) drug therapy: Secondary | ICD-10-CM | POA: Diagnosis not present

## 2021-10-20 DIAGNOSIS — C3431 Malignant neoplasm of lower lobe, right bronchus or lung: Secondary | ICD-10-CM | POA: Diagnosis present

## 2021-10-20 DIAGNOSIS — Z95810 Presence of automatic (implantable) cardiac defibrillator: Secondary | ICD-10-CM

## 2021-10-20 DIAGNOSIS — I083 Combined rheumatic disorders of mitral, aortic and tricuspid valves: Secondary | ICD-10-CM | POA: Insufficient documentation

## 2021-10-20 DIAGNOSIS — Z7982 Long term (current) use of aspirin: Secondary | ICD-10-CM | POA: Insufficient documentation

## 2021-10-20 DIAGNOSIS — R222 Localized swelling, mass and lump, trunk: Secondary | ICD-10-CM | POA: Diagnosis not present

## 2021-10-20 DIAGNOSIS — J439 Emphysema, unspecified: Secondary | ICD-10-CM | POA: Insufficient documentation

## 2021-10-20 DIAGNOSIS — I5042 Chronic combined systolic (congestive) and diastolic (congestive) heart failure: Secondary | ICD-10-CM

## 2021-10-21 ENCOUNTER — Ambulatory Visit: Payer: Medicare HMO | Admitting: Thoracic Surgery (Cardiothoracic Vascular Surgery)

## 2021-10-21 ENCOUNTER — Telehealth: Payer: Self-pay

## 2021-10-21 NOTE — Telephone Encounter (Signed)
Patient contacted the office 10/20/21 concerned about left and right sharp pains. He is unsure if this is to be expected and if these pains are related to "shocks" from his ICD. Advised that it would be best to contact his EP physician's office, Dr. Rayann Heman for better explanation, but that more than likely it was not due to his ICD. Given patient Dr. Jackalyn Lombard number and he acknowledged receipt.

## 2021-10-21 NOTE — Progress Notes (Signed)
EPIC Encounter for ICM Monitoring  Patient Name: Juan Norris is a 70 y.o. male Date: 10/21/2021 Primary Care Physican: [facility] Primary Cardiologist: Johney Frame Electrophysiologist: Allred Bi-V Pacing:   94.4%       09/12/2021 Weight: 155 lbs  VT-NS (>4 beats, >200 bpm) 2                                                          Transmission reviewed.    Optivol Thoracic impedance suggesting normal fluid levels.    Prescribed: Spironolactone 25 mg take 0.5 tablet (12.5 mg total) daily   Labs: 02/12/2021 Creatinine 0.95, BUN 11, Potassium 4.4, Sodium 139, GFR 87 A complete set of results can be found in Results Review.   Recommendations:  No changes   Follow-up plan: ICM clinic phone appointment on 11/13/2021.   91 day device clinic remote transmission 11/12/2021.      EP/Cardiology Office Visits: Recall 03/11/2022 with Dr Rayann Heman.  Recall 02/07/2022 with Dr Johney Frame.   Copy of ICM check sent to Dr. Rayann Heman.     3 month ICM trend: 10/20/2021.    1 Year ICM trend:       Rosalene Billings, RN 10/21/2021 1:44 PM

## 2021-10-22 NOTE — Telephone Encounter (Signed)
Attempted to contact Amy, RN at [phone] to follow up on radiation clearance form. No answer, LMTCB.

## 2021-10-22 NOTE — Telephone Encounter (Signed)
Spoke with Amy she stated that patient was having his radiation stimulation tomorrow and would know after that if the field of treatment was >10cm and would call back.

## 2021-10-23 ENCOUNTER — Ambulatory Visit
Admission: RE | Admit: 2021-10-23 | Discharge: 2021-10-23 | Disposition: A | Discharge status: Home / Self Care | Payer: No Typology Code available for payment source | Source: Ambulatory Visit | Attending: Radiation Oncology | Admitting: Radiation Oncology

## 2021-10-23 ENCOUNTER — Other Ambulatory Visit: Payer: Self-pay | Admitting: *Deleted

## 2021-10-23 ENCOUNTER — Other Ambulatory Visit: Payer: Self-pay

## 2021-10-23 DIAGNOSIS — Z510 Encounter for antineoplastic radiation therapy: Secondary | ICD-10-CM | POA: Insufficient documentation

## 2021-10-23 DIAGNOSIS — C3431 Malignant neoplasm of lower lobe, right bronchus or lung: Secondary | ICD-10-CM | POA: Insufficient documentation

## 2021-10-23 NOTE — Progress Notes (Signed)
The proposed treatment discussed in conference is for discussion purpose only and is not a binding recommendation. The patient was not been physically examined, or presented with their treatment options. Therefore, final treatment plans cannot be decided.  

## 2021-10-23 NOTE — Telephone Encounter (Signed)
Kristyne Woodring, RN with Dr. Clabe Seal office.  Pt had Radiation simulation today and it was determined that the site to be treated is >10cm from his implanted device.

## 2021-10-24 DIAGNOSIS — Z510 Encounter for antineoplastic radiation therapy: Secondary | ICD-10-CM | POA: Diagnosis not present

## 2021-10-28 ENCOUNTER — Telehealth: Payer: Self-pay

## 2021-10-28 NOTE — Telephone Encounter (Signed)
Pt LM requesting antibiotics be prescribed for an abcess on his tooth.  I reviewed pts records and pt has not been seen here in the [facility]. I have called pt back and advised him to contact his PCP or his Dentist regarding tx of this tooth abscess. Pt expressed understanding of this information.

## 2021-11-03 ENCOUNTER — Other Ambulatory Visit: Payer: Self-pay

## 2021-11-03 ENCOUNTER — Ambulatory Visit
Admission: RE | Admit: 2021-11-03 | Discharge: 2021-11-03 | Disposition: A | Discharge status: Home / Self Care | Payer: No Typology Code available for payment source | Source: Ambulatory Visit | Attending: Radiation Oncology | Admitting: Radiation Oncology

## 2021-11-03 DIAGNOSIS — C3431 Malignant neoplasm of lower lobe, right bronchus or lung: Secondary | ICD-10-CM

## 2021-11-03 DIAGNOSIS — Z510 Encounter for antineoplastic radiation therapy: Secondary | ICD-10-CM | POA: Diagnosis not present

## 2021-11-04 ENCOUNTER — Ambulatory Visit: Payer: No Typology Code available for payment source | Admitting: Radiation Oncology

## 2021-11-05 ENCOUNTER — Ambulatory Visit
Admission: RE | Admit: 2021-11-05 | Discharge: 2021-11-05 | Disposition: A | Discharge status: Home / Self Care | Payer: No Typology Code available for payment source | Source: Ambulatory Visit | Attending: Radiation Oncology | Admitting: Radiation Oncology

## 2021-11-05 ENCOUNTER — Other Ambulatory Visit: Payer: Self-pay

## 2021-11-05 DIAGNOSIS — C3431 Malignant neoplasm of lower lobe, right bronchus or lung: Secondary | ICD-10-CM

## 2021-11-05 DIAGNOSIS — Z510 Encounter for antineoplastic radiation therapy: Secondary | ICD-10-CM | POA: Diagnosis not present

## 2021-11-10 ENCOUNTER — Ambulatory Visit
Admission: RE | Admit: 2021-11-10 | Discharge: 2021-11-10 | Disposition: A | Discharge status: Home / Self Care | Payer: No Typology Code available for payment source | Source: Ambulatory Visit | Attending: Radiation Oncology | Admitting: Radiation Oncology

## 2021-11-10 ENCOUNTER — Other Ambulatory Visit: Payer: Self-pay

## 2021-11-10 DIAGNOSIS — Z510 Encounter for antineoplastic radiation therapy: Secondary | ICD-10-CM | POA: Diagnosis not present

## 2021-11-10 DIAGNOSIS — C3431 Malignant neoplasm of lower lobe, right bronchus or lung: Secondary | ICD-10-CM

## 2021-11-11 ENCOUNTER — Telehealth: Payer: Self-pay | Admitting: *Deleted

## 2021-11-11 NOTE — Telephone Encounter (Signed)
Awaiting FMLA form from collaborative radiation oncology nurse.  [place] cover sheet reads intermittent leave for Cindy R. Poth to begin 10/20/2021 without form attached.     Connected with AHAAN ZOBRIST daughter Erion Weightman [phone]) regarding FMLA needs for Dad's radiation.  "Thank you, they've taken care of that.  My leave has been approved."   No form or further activity required by this forms nurse.

## 2021-11-12 ENCOUNTER — Other Ambulatory Visit: Payer: Self-pay

## 2021-11-12 ENCOUNTER — Ambulatory Visit (INDEPENDENT_AMBULATORY_CARE_PROVIDER_SITE_OTHER): Payer: Medicare HMO

## 2021-11-12 ENCOUNTER — Ambulatory Visit
Admission: RE | Admit: 2021-11-12 | Discharge: 2021-11-12 | Disposition: A | Discharge status: Home / Self Care | Payer: No Typology Code available for payment source | Source: Ambulatory Visit | Attending: Radiation Oncology | Admitting: Radiation Oncology

## 2021-11-12 DIAGNOSIS — Z510 Encounter for antineoplastic radiation therapy: Secondary | ICD-10-CM | POA: Diagnosis not present

## 2021-11-12 DIAGNOSIS — C3431 Malignant neoplasm of lower lobe, right bronchus or lung: Secondary | ICD-10-CM

## 2021-11-12 DIAGNOSIS — I428 Other cardiomyopathies: Secondary | ICD-10-CM

## 2021-11-12 DIAGNOSIS — I447 Left bundle-branch block, unspecified: Secondary | ICD-10-CM

## 2021-11-13 LAB — CUP PACEART REMOTE DEVICE CHECK
Battery Remaining Longevity: 28 mo
Battery Remaining Longevity: 28 mo
Battery Voltage: 2.94 V
Battery Voltage: 2.94 V
Brady Statistic AP VP Percent: 24.52 %
Brady Statistic AP VP Percent: 0.84 %
Brady Statistic AP VS Percent: 0.02 %
Brady Statistic AP VS Percent: 0 %
Brady Statistic AS VP Percent: 74.86 %
Brady Statistic AS VP Percent: 99.16 %
Brady Statistic AS VS Percent: 0.6 %
Brady Statistic AS VS Percent: 0 %
Brady Statistic RA Percent Paced: 23.56 %
Brady Statistic RA Percent Paced: 0.84 %
Brady Statistic RV Percent Paced: 95.95 %
Brady Statistic RV Percent Paced: 99.83 %
Date Time Interrogation Session: 20221201121900
Date Time Interrogation Session: 20221201153525
HighPow Impedance: 58 Ohm
HighPow Impedance: 60 Ohm
Implantable Lead Implant Date: 20191126
Implantable Lead Implant Date: 20191126
Implantable Lead Implant Date: 20191126
Implantable Lead Implant Date: 20191126
Implantable Lead Implant Date: 20191126
Implantable Lead Implant Date: 20191126
Implantable Lead Location: 753859
Implantable Lead Location: 753860
Implantable Lead Location: 753860
Implantable Lead Location: 753859
Implantable Lead Location: 753860
Implantable Lead Location: 753860
Implantable Lead Model: 3830
Implantable Lead Model: 5076
Implantable Lead Model: 3830
Implantable Lead Model: 5076
Implantable Pulse Generator Implant Date: 20191126
Implantable Pulse Generator Implant Date: 20191126
Lead Channel Impedance Value: 228 Ohm
Lead Channel Impedance Value: 285 Ohm
Lead Channel Impedance Value: 285 Ohm
Lead Channel Impedance Value: 342 Ohm
Lead Channel Impedance Value: 418 Ohm
Lead Channel Impedance Value: 437 Ohm
Lead Channel Impedance Value: 228 Ohm
Lead Channel Impedance Value: 285 Ohm
Lead Channel Impedance Value: 285 Ohm
Lead Channel Impedance Value: 342 Ohm
Lead Channel Impedance Value: 399 Ohm
Lead Channel Impedance Value: 437 Ohm
Lead Channel Pacing Threshold Amplitude: 0.375 V
Lead Channel Pacing Threshold Amplitude: 0.5 V
Lead Channel Pacing Threshold Amplitude: 0.5 V
Lead Channel Pacing Threshold Amplitude: 0.375 V
Lead Channel Pacing Threshold Amplitude: 0.5 V
Lead Channel Pacing Threshold Amplitude: 0.5 V
Lead Channel Pacing Threshold Pulse Width: 0.4 ms
Lead Channel Pacing Threshold Pulse Width: 0.4 ms
Lead Channel Pacing Threshold Pulse Width: 0.4 ms
Lead Channel Pacing Threshold Pulse Width: 0.4 ms
Lead Channel Pacing Threshold Pulse Width: 0.4 ms
Lead Channel Pacing Threshold Pulse Width: 0.4 ms
Lead Channel Sensing Intrinsic Amplitude: 2.25 mV
Lead Channel Sensing Intrinsic Amplitude: 2.25 mV
Lead Channel Sensing Intrinsic Amplitude: 5.875 mV
Lead Channel Sensing Intrinsic Amplitude: 5.875 mV
Lead Channel Sensing Intrinsic Amplitude: 2.25 mV
Lead Channel Sensing Intrinsic Amplitude: 2.25 mV
Lead Channel Sensing Intrinsic Amplitude: 5.875 mV
Lead Channel Sensing Intrinsic Amplitude: 5.875 mV
Lead Channel Setting Pacing Amplitude: 1.5 V
Lead Channel Setting Pacing Amplitude: 2.5 V
Lead Channel Setting Pacing Amplitude: 2.5 V
Lead Channel Setting Pacing Amplitude: 1.5 V
Lead Channel Setting Pacing Amplitude: 2.5 V
Lead Channel Setting Pacing Amplitude: 2.5 V
Lead Channel Setting Pacing Pulse Width: 0.4 ms
Lead Channel Setting Pacing Pulse Width: 0.4 ms
Lead Channel Setting Pacing Pulse Width: 0.4 ms
Lead Channel Setting Pacing Pulse Width: 0.4 ms
Lead Channel Setting Sensing Sensitivity: 0.3 mV
Lead Channel Setting Sensing Sensitivity: 0.3 mV

## 2021-11-14 ENCOUNTER — Other Ambulatory Visit: Payer: Self-pay

## 2021-11-14 ENCOUNTER — Encounter: Payer: Self-pay | Admitting: Radiation Oncology

## 2021-11-14 ENCOUNTER — Ambulatory Visit (INDEPENDENT_AMBULATORY_CARE_PROVIDER_SITE_OTHER): Payer: Medicare HMO

## 2021-11-14 ENCOUNTER — Ambulatory Visit
Admission: RE | Admit: 2021-11-14 | Discharge: 2021-11-14 | Disposition: A | Discharge status: Home / Self Care | Payer: No Typology Code available for payment source | Source: Ambulatory Visit | Attending: Radiation Oncology | Admitting: Radiation Oncology

## 2021-11-14 DIAGNOSIS — Z510 Encounter for antineoplastic radiation therapy: Secondary | ICD-10-CM | POA: Diagnosis present

## 2021-11-14 DIAGNOSIS — I5042 Chronic combined systolic (congestive) and diastolic (congestive) heart failure: Secondary | ICD-10-CM

## 2021-11-14 DIAGNOSIS — Z95810 Presence of automatic (implantable) cardiac defibrillator: Secondary | ICD-10-CM

## 2021-11-14 DIAGNOSIS — C3431 Malignant neoplasm of lower lobe, right bronchus or lung: Secondary | ICD-10-CM | POA: Diagnosis not present

## 2021-11-14 NOTE — Progress Notes (Signed)
EPIC Encounter for ICM Monitoring  Patient Name: Juan Norris is a 70 y.o. male Date: 11/14/2021 Primary Care Physican: [facility] Primary Cardiologist: Johney Frame Electrophysiologist: Allred Bi-V Pacing:   99.8%       09/12/2021 Weight: 155 lbs   VT-NS (>4 beats, >200 bpm)    2                                                          Transmission reviewed.    Optivol Thoracic impedance suggesting normal fluid levels.    Prescribed: Spironolactone 25 mg take 0.5 tablet (12.5 mg total) daily   Labs: 02/12/2021 Creatinine 0.95, BUN 11, Potassium 4.4, Sodium 139, GFR 87 A complete set of results can be found in Results Review.   Recommendations:  No changes   Follow-up plan: ICM clinic phone appointment on 12/22/2021.   91 day device clinic remote transmission 02/11/2022.      EP/Cardiology Office Visits: Recall 03/11/2022 with Dr Rayann Heman.  Recall 02/07/2022 with Dr Johney Frame.   Copy of ICM check sent to Dr. Rayann Heman.     3 month ICM trend: 11/13/2021.    12-14 Month ICM trend:       Rosalene Billings, RN 11/14/2021 2:18 PM

## 2021-11-20 ENCOUNTER — Ambulatory Visit: Payer: Self-pay | Admitting: Radiation Oncology

## 2021-11-21 NOTE — Progress Notes (Signed)
Remote ICD transmission.   

## 2021-11-27 ENCOUNTER — Ambulatory Visit: Payer: Medicare HMO | Admitting: Emergency Medicine

## 2021-12-01 ENCOUNTER — Telehealth: Payer: Self-pay | Admitting: *Deleted

## 2021-12-01 NOTE — Telephone Encounter (Signed)
RETURNED PATIENT'S PHONE CALL, SPOKE WITH PATIENT. ?

## 2021-12-12 ENCOUNTER — Encounter: Payer: Self-pay | Admitting: Radiation Oncology

## 2021-12-17 NOTE — Progress Notes (Signed)
Radiation Oncology         [phone] ________________________________  Name: Juan Norris MRN: [id number]  Date: 12/18/2021  DOB: 07-03-1951  Follow-Up Visit Note  CC: [facility], [facility]    ICD-10-CM   1. Malignant neoplasm of bronchus of right lower lobe (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  The patient tolerated radiation therapy relatively well. He denied pain, though reported having a low energy level. He denied coughing, shortness of breath, or swallowing issues.                        Since his last follow up on 10/20/21, the patient's case was discussed at the tumor board on 10/23/21.    Otherwise, no significant interval history since the patient was last seen.     He is doing well after completion of his radiation therapy at this time.  He specifically denies any changes in his breathing,  significant cough, shortness of breath or hemoptysis.  His appetite is good.  Denies any pain along the right lower chest region.     Allergies:  has No Known Allergies.  Meds: Current Outpatient Medications  Medication Sig Dispense Refill   aspirin 81 MG EC tablet Take 1 tablet (81 mg total) by mouth daily. 30 tablet 6   atorvastatin (LIPITOR) 40 MG tablet Take 1 tablet (40 mg total) by mouth at bedtime.     carvedilol (COREG) 25 MG tablet Take 12.5 mg by mouth daily.     Multiple Vitamin (MULTIVITAMIN WITH MINERALS) TABS tablet Take 1 tablet by mouth daily.     sacubitril-valsartan (ENTRESTO) 24-26 MG Take 1 tablet by mouth 2  (two) times daily. Please make overdue appt with Dr. Meda Coffee before anymore refills. 1st attempt 60 tablet 0   spironolactone (ALDACTONE) 25 MG tablet Take 0.5 tablets (12.5 mg total) by mouth daily. 15 tablet 5   No current facility-administered medications for this encounter.    Physical Findings: The patient is in no acute distress. Patient is alert and oriented.  height is 5\' 5"  (1.651 m) and weight is 158 lb (71.7 kg). His temperature is 97.6 F (36.4 C). His blood pressure is 138/50 (abnormal) and his pulse is 64. His respiration is 20 and oxygen saturation is 99%. .  No significant changes. Lungs are clear to auscultation bilaterally. Heart has regular rate and rhythm. No palpable cervical, supraclavicular, or axillary adenopathy. Abdomen soft, non-tender, normal bowel sounds.    Lab Findings: Lab Results  Component Value Date   WBC 10.6 (H) 10/13/2021   HGB 13.3 10/13/2021   HCT 39.6 10/13/2021   MCV 91.5 10/13/2021   PLT 171 10/13/2021    Radiographic Findings: No results found.  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  No lasting side effects at this time.  Plan: Patient will be scheduled for a  chest CT scan in 3 months and follow-up afterward to assess his response to treatment.  He will also see Dr. Lamonte Sakai later this month.   ____________________________________  Blair Promise, PhD, MD   This document serves as a record of services personally performed by Gery Pray, MD. It was created on his behalf by Roney Mans, a trained medical scribe. The creation of this record is based on the scribe's personal observations and the provider's statements to them. This document has been checked and approved by the attending provider.

## 2021-12-17 NOTE — Progress Notes (Incomplete)
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 Patient denies pain, reports having a low energy level. Denies coughing, shortness of breath, or swallowing issues. Skin remains intact.  Plan: The patient will follow-up with radiation oncology in one month .  ________________________________________________ -----------------------------------  Blair Promise, PhD, MD  This document serves as a record of services personally performed by Gery Pray, MD. It was created on his behalf by Roney Mans, a trained medical scribe. The creation of this record is based on the scribe's personal observations and the provider's statements to them. This document has been checked and approved by the attending provider.

## 2021-12-18 ENCOUNTER — Ambulatory Visit
Admission: RE | Admit: 2021-12-18 | Discharge: 2021-12-18 | Disposition: A | Discharge status: Home / Self Care | Payer: No Typology Code available for payment source | Source: Ambulatory Visit | Attending: Radiation Oncology | Admitting: Radiation Oncology

## 2021-12-18 ENCOUNTER — Other Ambulatory Visit: Payer: Self-pay

## 2021-12-18 ENCOUNTER — Encounter: Payer: Self-pay | Admitting: Radiation Oncology

## 2021-12-18 VITALS — BP 138/50 | HR 64 | Temp 97.6°F | Resp 20 | Ht 65.0 in | Wt 158.0 lb

## 2021-12-18 DIAGNOSIS — Z923 Personal history of irradiation: Secondary | ICD-10-CM | POA: Diagnosis not present

## 2021-12-18 DIAGNOSIS — Z7982 Long term (current) use of aspirin: Secondary | ICD-10-CM | POA: Diagnosis not present

## 2021-12-18 DIAGNOSIS — Z79899 Other long term (current) drug therapy: Secondary | ICD-10-CM | POA: Insufficient documentation

## 2021-12-18 DIAGNOSIS — C3431 Malignant neoplasm of lower lobe, right bronchus or lung: Secondary | ICD-10-CM | POA: Diagnosis not present

## 2021-12-18 HISTORY — DX: Malignant neoplasm of unspecified part of unspecified bronchus or lung: C34.90

## 2021-12-18 HISTORY — DX: Personal history of irradiation: Z92.3

## 2021-12-18 NOTE — Progress Notes (Signed)
Juan Norris is here today for follow up post radiation to the lung.  Lung Side: right  Completed radiation treatment on: 11/14/2021  Does the patient complain of any of the following: Pain:7/10 back pain described as ache  Shortness of breath w/wo exertion: denies Cough: denies Hemoptysis: denies Pain with swallowing: denies Swallowing/choking concerns: denies Appetite: good Energy Level: good Post radiation skin Changes: States chest and skin in general has become lighter    Additional comments if applicable: resumed smoking cigarettes. Has used nicotine lozenges but plans to change to patches due to taste.  Vitals:   12/18/21 0838  BP: (!) 138/50  Pulse: 64  Resp: 20  Temp: 97.6 F (36.4 C)  SpO2: 99%  Weight: 158 lb (71.7 kg)  Height: 5\' 5"  (1.651 m)

## 2021-12-22 ENCOUNTER — Ambulatory Visit (INDEPENDENT_AMBULATORY_CARE_PROVIDER_SITE_OTHER): Payer: Medicare PPO

## 2021-12-22 DIAGNOSIS — Z95810 Presence of automatic (implantable) cardiac defibrillator: Secondary | ICD-10-CM

## 2021-12-22 DIAGNOSIS — I5042 Chronic combined systolic (congestive) and diastolic (congestive) heart failure: Secondary | ICD-10-CM

## 2021-12-26 NOTE — Progress Notes (Signed)
EPIC Encounter for ICM Monitoring  Patient Name: Juan Norris is a 71 y.o. male Date: 12/26/2021 Primary Care Physican: [facility] Primary Cardiologist: Johney Frame Electrophysiologist: Allred Bi-V Pacing:   99.8%       12/26/2021 Weight: 158 lbs   VT-NS (>4 beats, >200 bpm)    2                                                          Spoke with patient and heart failure questions reviewed.  Pt asymptomatic for fluid accumulation.  Reports feeling well at this time and voices no complaints.    Optivol Thoracic impedance suggesting normal fluid levels.    Prescribed: Spironolactone 25 mg take 0.5 tablet (12.5 mg total) daily   Labs: 02/12/2021 Creatinine 0.95, BUN 11, Potassium 4.4, Sodium 139, GFR 87 A complete set of results can be found in Results Review.   Recommendations:  No changes and encouraged to call if experiencing any fluid symptoms.   Follow-up plan: ICM clinic phone appointment on 01/26/2022.   91 day device clinic remote transmission 02/11/2022.      EP/Cardiology Office Visits: Recall 03/11/2022 with Dr Rayann Heman.  Recall 02/07/2022 with Dr Johney Frame.   Copy of ICM check sent to Dr. Rayann Heman.    3 month ICM trend: 12/22/2021.    12-14 Month ICM trend:     Rosalene Billings, RN 12/26/2021 3:49 PM

## 2022-01-07 ENCOUNTER — Ambulatory Visit: Payer: Medicare HMO | Admitting: Emergency Medicine

## 2022-01-26 ENCOUNTER — Ambulatory Visit (INDEPENDENT_AMBULATORY_CARE_PROVIDER_SITE_OTHER): Payer: Medicare PPO

## 2022-01-26 DIAGNOSIS — I5042 Chronic combined systolic (congestive) and diastolic (congestive) heart failure: Secondary | ICD-10-CM

## 2022-01-26 DIAGNOSIS — Z95810 Presence of automatic (implantable) cardiac defibrillator: Secondary | ICD-10-CM

## 2022-01-28 ENCOUNTER — Telehealth: Payer: Self-pay

## 2022-01-28 NOTE — Telephone Encounter (Signed)
Patient sent missed ICM Transmission.

## 2022-01-29 NOTE — Progress Notes (Signed)
EPIC Encounter for ICM Monitoring  Patient Name: Juan Norris is a 71 y.o. male Date: 01/29/2022 Primary Care Physican: [facility] Primary Cardiologist: Johney Frame Electrophysiologist: Allred Bi-V Pacing:   99.5%       01/29/2022 Weight: 158 lbs   VT-NS (>4 beats, >200 bpm)   1                                                          Spoke with patient and heart failure questions reviewed.  Pt asymptomatic for fluid accumulation.  Reports feeling well at this time and voices no complaints.     Optivol Thoracic impedance suggesting normal fluid levels.    Prescribed: Spironolactone 25 mg take 0.5 tablet (12.5 mg total) daily   Labs: 02/12/2021 Creatinine 0.95, BUN 11, Potassium 4.4, Sodium 139, GFR 87 A complete set of results can be found in Results Review.   Recommendations:  No changes and encouraged to call if experiencing any fluid symptoms.   Follow-up plan: ICM clinic phone appointment on 03/02/2022.   91 day device clinic remote transmission 02/11/2022.      EP/Cardiology Office Visits: Recall 03/11/2022 with Oda Kilts, PA or Tommye Standard, [place].  Recall 02/07/2022 with Dr Johney Frame.   Copy of ICM check sent to Dr. Rayann Heman.    3 month ICM trend: 01/26/2022.    12-14 Month ICM trend:     Rosalene Billings, RN 01/29/2022 10:25 AM

## 2022-02-11 ENCOUNTER — Ambulatory Visit (INDEPENDENT_AMBULATORY_CARE_PROVIDER_SITE_OTHER): Payer: Medicare PPO

## 2022-02-11 DIAGNOSIS — I428 Other cardiomyopathies: Secondary | ICD-10-CM

## 2022-02-12 LAB — CUP PACEART REMOTE DEVICE CHECK
Battery Remaining Longevity: 24 mo
Battery Voltage: 2.94 V
Brady Statistic AP VP Percent: 29.74 %
Brady Statistic AP VS Percent: 0.02 %
Brady Statistic AS VP Percent: 69.99 %
Brady Statistic AS VS Percent: 0.24 %
Brady Statistic RA Percent Paced: 29.38 %
Brady Statistic RV Percent Paced: 98.52 %
Date Time Interrogation Session: 20230301001703
HighPow Impedance: 61 Ohm
Implantable Lead Implant Date: 20191126
Implantable Lead Implant Date: 20191126
Implantable Lead Implant Date: 20191126
Implantable Lead Location: 753859
Implantable Lead Location: 753860
Implantable Lead Location: 753860
Implantable Lead Model: 3830
Implantable Lead Model: 5076
Implantable Pulse Generator Implant Date: 20191126
Lead Channel Impedance Value: 228 Ohm
Lead Channel Impedance Value: 285 Ohm
Lead Channel Impedance Value: 285 Ohm
Lead Channel Impedance Value: 342 Ohm
Lead Channel Impedance Value: 418 Ohm
Lead Channel Impedance Value: 475 Ohm
Lead Channel Pacing Threshold Amplitude: 0.375 V
Lead Channel Pacing Threshold Amplitude: 0.5 V
Lead Channel Pacing Threshold Amplitude: 0.625 V
Lead Channel Pacing Threshold Pulse Width: 0.4 ms
Lead Channel Pacing Threshold Pulse Width: 0.4 ms
Lead Channel Pacing Threshold Pulse Width: 0.4 ms
Lead Channel Sensing Intrinsic Amplitude: 2 mV
Lead Channel Sensing Intrinsic Amplitude: 2 mV
Lead Channel Sensing Intrinsic Amplitude: 5.875 mV
Lead Channel Sensing Intrinsic Amplitude: 5.875 mV
Lead Channel Setting Pacing Amplitude: 1.5 V
Lead Channel Setting Pacing Amplitude: 2.5 V
Lead Channel Setting Pacing Amplitude: 2.5 V
Lead Channel Setting Pacing Pulse Width: 0.4 ms
Lead Channel Setting Pacing Pulse Width: 0.4 ms
Lead Channel Setting Sensing Sensitivity: 0.3 mV

## 2022-02-18 NOTE — Progress Notes (Signed)
Remote ICD transmission.   

## 2022-03-02 ENCOUNTER — Ambulatory Visit (INDEPENDENT_AMBULATORY_CARE_PROVIDER_SITE_OTHER): Payer: Medicare PPO

## 2022-03-02 DIAGNOSIS — Z95810 Presence of automatic (implantable) cardiac defibrillator: Secondary | ICD-10-CM | POA: Diagnosis not present

## 2022-03-02 DIAGNOSIS — I5042 Chronic combined systolic (congestive) and diastolic (congestive) heart failure: Secondary | ICD-10-CM | POA: Diagnosis not present

## 2022-03-06 ENCOUNTER — Telehealth: Payer: Self-pay

## 2022-03-06 NOTE — Telephone Encounter (Signed)
Remote ICM transmission received.  Attempted call to patient regarding ICM remote transmission and left detailed message per DPR.  Advised to return call for any fluid symptoms or questions. Next ICM remote transmission scheduled 04/06/2022.   ? ?

## 2022-03-06 NOTE — Progress Notes (Signed)
EPIC Encounter for ICM Monitoring ? ?Patient Name: Juan Norris is a 71 y.o. male ?Date: 03/06/2022 ?Primary Care Physican: [facility] ?Primary Cardiologist: Johney Frame ?Electrophysiologist: Allred ?Bi-V Pacing:   99.6%       ?01/29/2022 Weight: 158 lbs ?  ?VT-NS (>4 beats, >200 bpm)   6 ?                                                         ?Attempted call to patient and unable to reach.  Left detailed message per DPR regarding transmission. Transmission reviewed.  ?  ?Optivol Thoracic impedance suggesting normal fluid levels.  ?  ?Prescribed: Spironolactone 25 mg take 0.5 tablet (12.5 mg total) daily ?  ?Labs: ?10/13/2021 Creatinine 1.08, BUN 20, Potassium 3.9, Sodium 138 ?08/31/2021 Creatinine 1.10, BUN 11, Potassium 4.5, Sodium 142 ?02/12/2021 Creatinine 0.95, BUN 11, Potassium 4.4, Sodium 139, GFR 87 ?A complete set of results can be found in Results Review. ?  ?Recommendations:  Left voice mail with ICM number and encouraged to call if experiencing any fluid symptoms. ?  ?Follow-up plan: ICM clinic phone appointment on 04/06/2022.   91 day device clinic remote transmission 05/13/2022.    ?  ?EP/Cardiology Office Visits: Recall 03/11/2022 with Oda Kilts, PA or Tommye Standard, [place].  Recall 02/07/2022 with Dr Johney Frame. ?  ?Copy of ICM check sent to Dr. Rayann Heman.   ? ?3 month ICM trend: 03/02/2022. ? ? ? ?12-14 Month ICM trend:  ? ? ? ?Rosalene Billings, RN ?03/06/2022 ?1:09 PM ? ?

## 2022-03-10 ENCOUNTER — Telehealth: Payer: Self-pay | Admitting: *Deleted

## 2022-03-10 NOTE — Telephone Encounter (Signed)
CALLED PATIENT TO INFORM OF CT FOR 03-17-22- ARRIVAL TIME- 12:30 PM @ [facility], NO RESTRICTIONS TO TEST, PATIENT TO RECEIVE RESULTS FROM DR. KINARD ON 03-23-22 @ 10:45 AM, SPOKE WITH PATIENT AND HE IS AWARE OF THESE APPTS. ?

## 2022-03-17 ENCOUNTER — Ambulatory Visit (HOSPITAL_COMMUNITY)
Admission: RE | Admit: 2022-03-17 | Discharge: 2022-03-17 | Disposition: A | Discharge status: Home / Self Care | Payer: Medicare PPO | Source: Ambulatory Visit | Attending: Radiation Oncology | Admitting: Radiation Oncology

## 2022-03-17 DIAGNOSIS — C3431 Malignant neoplasm of lower lobe, right bronchus or lung: Secondary | ICD-10-CM | POA: Diagnosis present

## 2022-03-22 NOTE — Progress Notes (Signed)
?Radiation Oncology         [phone] ?________________________________ ? ?Name: Juan Norris MRN: [id number]  ?Date: 03/23/2022  DOB: February 03, 1951 ? ?Follow-Up Visit Note ? ?CC: [facility], [facility] ? ?  ICD-10-CM   ?1. Malignant neoplasm of bronchus of right lower lobe ([facility])  C34.31 CT CHEST WO CONTRAST  ?  ? ? 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 ?Interval Since Last Radiation: 4 months and 8 days ? ?Intent: Curative ? ?Radiation Treatment Dates: 11/03/2021 through 11/14/2021 ?Site Technique Total Dose (Gy) Dose per Fx (Gy) Completed Fx Beam Energies  ?Lung, Right: Lung_Rt IMRT 60/60 12 5/5 6XFFF  ? ? ?Narrative:  The patient returns today for routine follow-up and to review recent imaging, he was last seen here for follow-up on 12/18/21. Since his last visit, the patient followed up with Amy Marvell [facility] at the [facility] on [facility]/19/22. During this visit, the patient reported tolerating RT well overall and denied any complaints. The patient was also counseled on smoking cessation and reported cutting back to 1 cigarette a day. His survivorship care plan was also reviewed during this visit.         ? ?His most recent chest CT on 03/17/22 showed an interval decrease in size of the right lower lobe pulmonary ?nodule, no new suspicious pulmonary nodules/masses, or evidence of thoracic metastatic disease. CT also revealed marked paraseptal emphysema; worse in the lung apices with moderate bullous changes, and mild symmetric esophageal wall thickening (possibly reflective of esophagitis).        ? ?Otherwise, no significant interval history since the patient was last seen.  ? ?He reports chronic low back pain.  He denies any pain within the chest area significant cough or hemoptysis.  He reports his breathing as stable.           ? ?Allergies:  has No Known Allergies. ? ?Meds: ?Current Outpatient Medications  ?Medication Sig  Dispense Refill  ? aspirin 81 MG EC tablet Take 1 tablet (81 mg total) by mouth daily. 30 tablet 6  ? atorvastatin (LIPITOR) 40 MG tablet Take 1 tablet (40 mg total) by mouth at bedtime.    ? carvedilol (COREG) 25 MG tablet Take 12.5 mg by mouth daily.    ? Multiple Vitamin (MULTIVITAMIN WITH MINERALS) TABS tablet Take 1 tablet by mouth daily.    ? nicotine (NICODERM CQ - DOSED IN MG/24 HOURS) 14 mg/24hr patch APPLY 1 PATCH TO SKIN ONCE A DAY FOR NICOTINE REPLACEMENT THERAPY *APPLY TO NON-HAIRY, CLEAN, DRY AREA (NO TOBACCO PRODUCTS)* - USE FOR 6 WEEKS    ? sacubitril-valsartan (ENTRESTO) 24-26 MG Take 1 tablet by mouth 2 (two) times daily. Please make overdue appt with Dr. Meda Coffee before anymore refills. 1st attempt 60 tablet 0  ? spironolactone (ALDACTONE) 25 MG tablet Take 0.5 tablets (12.5 mg total) by mouth daily. 15 tablet 5  ? ?No current facility-administered medications for this encounter.  ? ? ?Physical Findings: ?The patient is in no acute distress. Patient is alert and oriented. ? height is 5\' 6"  (1.676 m) and weight is 159 lb 12.8 oz (72.5 kg). His temperature is 98.2 ?F (36.8 ?C). His blood pressure is 127/49 (abnormal) and his pulse is 60. His respiration is 20 and oxygen saturation is 100%. .  No significant changes. Lungs are clear to auscultation bilaterally. Heart has regular rate and rhythm. No palpable  cervical, supraclavicular, or axillary adenopathy. Abdomen soft, non-tender, normal bowel sounds.  ? ? ?Lab Findings: ?Lab Results  ?Component Value Date  ? WBC 10.6 (H) 10/13/2021  ? HGB 13.3 10/13/2021  ? HCT 39.6 10/13/2021  ? MCV 91.5 10/13/2021  ? PLT 171 10/13/2021  ? ? ?Radiographic Findings: ?CT CHEST WO CONTRAST ? ?Result Date: 03/18/2022 ?CLINICAL DATA:  Non-small cell lung cancer, nonmetastatic, assess treatment response. XRT completed 3 months ago. * Tracking Code: BO * EXAM: CT CHEST WITHOUT CONTRAST TECHNIQUE: Multidetector CT imaging of the chest was performed following the standard  protocol without IV contrast. RADIATION DOSE REDUCTION: This exam was performed according to the departmental dose-optimization program which includes automated exposure control, adjustment of the mA and/or kV according to patient size and/or use of iterative reconstruction technique. COMPARISON:  Multiple priors including most recent CT October 03, 2021 FINDINGS: Cardiovascular: Left chest cardiac generator with leads in the right atrium and right ventricle. Aortic atherosclerosis without aneurysmal dilation. Left-sided cardiac enlargement. No significant pericardial effusion/thickening. Mediastinum/Nodes: No supraclavicular adenopathy. No discrete thyroid nodule. No pathologically enlarged mediastinal, hilar or axillary lymph nodes, noting limited sensitivity for the detection of hilar adenopathy on this noncontrast study. Mild symmetric esophageal wall thickening which may reflect esophagitis. Lungs/Pleura: Posterior right lower lobe pulmonary nodule measures 1.4 x 1.0 cm on image 67/5 previously 2.3 x 1.1 cm. No new suspicious pulmonary nodules or masses. Marked paraseptal emphysema worse in the lung apices with moderate bullous changes. No pleural effusion. No pneumothorax. Upper Abdomen: Calcified splenic granulomata. Musculoskeletal: Multilevel degenerative changes spine. No acute osseous abnormality. IMPRESSION: 1. Interval decrease in size of the right lower lobe pulmonary nodule. No new suspicious pulmonary nodules or masses. 2. No evidence of thoracic metastatic disease. 3. Marked paraseptal emphysema worse in the lung apices with moderate bullous changes. 4. Mild symmetric esophageal wall thickening which may reflect esophagitis. 5. Aortic Atherosclerosis (ICD10-I70.0) and Emphysema (ICD10-J43.9). Electronically Signed   By: Dahlia Bailiff M.D.   On: 03/18/2022 10:00   ? 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 ?No evidence of  recurrence on clinical exam today.  Recent chest CT scan also very favorable. ? ?Plan: Patient will return for routine follow-up in 6 months.  Prior to this follow-up appointment the patient will have a repeat and of the chest. ? ? ?20 minutes of total time was spent for this patient encounter, including preparation, face-to-face counseling with the patient and coordination of care, physical exam, and documentation of the encounter. ?____________________________________ ? ?Blair Promise, PhD, MD ? ?This document serves as a record of services personally performed by Gery Pray, MD. It was created on his behalf by Roney Mans, a trained medical scribe. The creation of this record is based on the scribe's personal observations and the provider's statements to them. This document has been checked and approved by the attending provider. ? ?

## 2022-03-23 ENCOUNTER — Encounter: Payer: Self-pay | Admitting: Radiation Oncology

## 2022-03-23 ENCOUNTER — Other Ambulatory Visit: Payer: Self-pay

## 2022-03-23 ENCOUNTER — Ambulatory Visit: Payer: Self-pay | Admitting: Radiation Oncology

## 2022-03-23 ENCOUNTER — Ambulatory Visit
Admission: RE | Admit: 2022-03-23 | Discharge: 2022-03-23 | Disposition: A | Discharge status: Home / Self Care | Payer: Medicare PPO | Source: Ambulatory Visit | Attending: Radiation Oncology | Admitting: Radiation Oncology

## 2022-03-23 VITALS — BP 127/49 | HR 60 | Temp 98.2°F | Resp 20 | Ht 66.0 in | Wt 159.8 lb

## 2022-03-23 DIAGNOSIS — Z85118 Personal history of other malignant neoplasm of bronchus and lung: Secondary | ICD-10-CM | POA: Diagnosis present

## 2022-03-23 DIAGNOSIS — Z7982 Long term (current) use of aspirin: Secondary | ICD-10-CM | POA: Diagnosis not present

## 2022-03-23 DIAGNOSIS — M545 Low back pain, unspecified: Secondary | ICD-10-CM | POA: Diagnosis not present

## 2022-03-23 DIAGNOSIS — I700 Atherosclerosis of aorta: Secondary | ICD-10-CM | POA: Diagnosis not present

## 2022-03-23 DIAGNOSIS — C3431 Malignant neoplasm of lower lobe, right bronchus or lung: Secondary | ICD-10-CM

## 2022-03-23 DIAGNOSIS — G8929 Other chronic pain: Secondary | ICD-10-CM | POA: Insufficient documentation

## 2022-03-23 DIAGNOSIS — Z79899 Other long term (current) drug therapy: Secondary | ICD-10-CM | POA: Insufficient documentation

## 2022-03-23 NOTE — Progress Notes (Signed)
Juan Norris is here today for follow up post radiation to the lung. ? ?Lung Side: right ? ?Completed radiation treatment on: 11/14/2021 ? ?Does the patient complain of any of the following: ?Pain:6/10 back pain ?Shortness of breath w/wo exertion: denies ?Cough: denies ?Hemoptysis: denies ?Pain with swallowing: denies ?Swallowing/choking concerns: denies ?Appetite: good ?Energy Level: good ?Post radiation skin Changes: occasional itching on back ? ? ? ?Additional comments if applicable: nothing of note ? ?Vitals:  ? 03/23/22 1039  ?BP: (!) 127/49  ?Pulse: 60  ?Resp: 20  ?Temp: 98.2 ?F (36.8 ?C)  ?SpO2: 100%  ?Weight: 159 lb 12.8 oz (72.5 kg)  ?Height: 5\' 6"  (1.676 m)  ? ? ? ?

## 2022-04-06 ENCOUNTER — Ambulatory Visit (INDEPENDENT_AMBULATORY_CARE_PROVIDER_SITE_OTHER): Payer: Medicare PPO

## 2022-04-06 DIAGNOSIS — I5042 Chronic combined systolic (congestive) and diastolic (congestive) heart failure: Secondary | ICD-10-CM

## 2022-04-06 DIAGNOSIS — Z95810 Presence of automatic (implantable) cardiac defibrillator: Secondary | ICD-10-CM

## 2022-04-08 ENCOUNTER — Telehealth: Payer: Self-pay

## 2022-04-08 NOTE — Telephone Encounter (Signed)
I spoke with the patient and helped him send missed ICM transmission.Transmission received. ?

## 2022-04-09 ENCOUNTER — Telehealth: Payer: Self-pay

## 2022-04-09 NOTE — Progress Notes (Signed)
EPIC Encounter for ICM Monitoring ? ?Patient Name: Juan Norris is a 71 y.o. male ?Date: 04/09/2022 ?[facility] Physican: [facility] ?Primary Cardiologist: Johney Frame ?Electrophysiologist: Allred ?Bi-V Pacing:   99.6%       ?01/29/2022 Weight: 158 lbs ?  ?                                                         ?Attempted call to patient and unable to reach.  Left detailed message per DPR regarding transmission. Transmission reviewed.  ?  ?Optivol Thoracic impedance suggesting possible fluid accumulation starting 4/17.  ?  ?Prescribed: Spironolactone 25 mg take 0.5 tablet (12.5 mg total) daily ?  ?Labs: ?10/13/2021 Creatinine 1.08, BUN 20, Potassium 3.9, Sodium 138 ?08/31/2021 Creatinine 1.10, BUN 11, Potassium 4.5, Sodium 142 ?02/12/2021 Creatinine 0.95, BUN 11, Potassium 4.4, Sodium 139, GFR 87 ?A complete set of results can be found in Results Review. ?  ?Recommendations:  Left voice mail with ICM number and encouraged to call if experiencing any fluid symptoms. ?  ?Follow-up plan: ICM clinic phone appointment on 04/14/2022 to recheck fluid levels.   91 day device clinic remote transmission 05/13/2022.    ?  ?EP/Cardiology Office Visits: Recall 03/11/2022 with Oda Kilts, PA or Tommye Standard, [place].  Recall 02/07/2022 with Dr Johney Frame. ?  ?Copy of ICM check sent to Dr. Rayann Heman.  Will send to Dr Johney Frame for review if patient is reached.  ? ?3 month ICM trend: 04/08/2022. ? ? ? ?12-14 Month ICM trend:  ? ? ? ?Rosalene Billings, RN ?04/09/2022 ?2:11 PM ? ?

## 2022-04-09 NOTE — Telephone Encounter (Signed)
Remote ICM transmission received.  Attempted call to patient regarding ICM remote transmission and no answer.  

## 2022-04-14 ENCOUNTER — Ambulatory Visit (INDEPENDENT_AMBULATORY_CARE_PROVIDER_SITE_OTHER): Payer: Medicare PPO

## 2022-04-14 DIAGNOSIS — Z95810 Presence of automatic (implantable) cardiac defibrillator: Secondary | ICD-10-CM

## 2022-04-14 DIAGNOSIS — I5042 Chronic combined systolic (congestive) and diastolic (congestive) heart failure: Secondary | ICD-10-CM

## 2022-04-17 NOTE — Progress Notes (Signed)
EPIC Encounter for ICM Monitoring ? ?Patient Name: Juan Norris is a 71 y.o. male ?Date: 04/17/2022 ?[facility] Care Physican: [facility] ?Primary Cardiologist: Johney Frame ?Electrophysiologist: Allred ?Bi-V Pacing:   99.5%       ?01/29/2022 Weight: 158 lbs ?  ?                                                         ?Transmission reviewed.  ?  ?Optivol Thoracic impedance suggesting fluid levels returned to normal.  ?  ?Prescribed: Spironolactone 25 mg take 0.5 tablet (12.5 mg total) daily ?  ?Labs: ?10/13/2021 Creatinine 1.08, BUN 20, Potassium 3.9, Sodium 138 ?08/31/2021 Creatinine 1.10, BUN 11, Potassium 4.5, Sodium 142 ?02/12/2021 Creatinine 0.95, BUN 11, Potassium 4.4, Sodium 139, GFR 87 ?A complete set of results can be found in Results Review. ?  ?Recommendations:  No changes.  ?  ?Follow-up plan: ICM clinic phone appointment on 05/14/2022.   91 day device clinic remote transmission 05/13/2022.    ?  ?EP/Cardiology Office Visits: Recall 03/11/2022 with Oda Kilts, PA or Tommye Standard, [place].  Recall 02/07/2022 with Dr Johney Frame. ?  ?Copy of ICM check sent to Dr. Rayann Heman. ? ?3 month ICM trend: 04/14/2022. ? ? ? ?12-14 Month ICM trend:  ? ? ? ?Rosalene Billings, RN ?04/17/2022 ?3:40 PM ? ?

## 2022-05-15 ENCOUNTER — Ambulatory Visit (INDEPENDENT_AMBULATORY_CARE_PROVIDER_SITE_OTHER): Payer: Medicare PPO

## 2022-05-15 DIAGNOSIS — I5042 Chronic combined systolic (congestive) and diastolic (congestive) heart failure: Secondary | ICD-10-CM

## 2022-05-15 DIAGNOSIS — Z95810 Presence of automatic (implantable) cardiac defibrillator: Secondary | ICD-10-CM | POA: Diagnosis not present

## 2022-05-15 NOTE — Progress Notes (Signed)
EPIC Encounter for ICM Monitoring  Patient Name: Juan Norris is a 71 y.o. male Date: 05/15/2022 Primary Care Physican: [facility] Primary Cardiologist: Johney Frame Electrophysiologist: Allred Bi-V Pacing:   99.7%       01/29/2022 Weight: 158 lbs 05/15/2022 Weight: 164 lbs                                                            Spoke with patient and heart failure questions reviewed.  Pt asymptomatic for fluid accumulation.  Reports feeling well at this time and voices no complaints.    Optivol Thoracic impedance suggesting normal fluid levels.    Prescribed: Spironolactone 25 mg take 0.5 tablet (12.5 mg total) daily   Labs: 10/13/2021 Creatinine 1.08, BUN 20, Potassium 3.9, Sodium 138 08/31/2021 Creatinine 1.10, BUN 11, Potassium 4.5, Sodium 142 02/12/2021 Creatinine 0.95, BUN 11, Potassium 4.4, Sodium 139, GFR 87 A complete set of results can be found in Results Review.   Recommendations:  No changes and encouraged to call if experiencing any fluid symptoms.   Follow-up plan: ICM clinic phone appointment on 06/17/2022.   91 day device clinic remote transmission 08/12/2022.      EP/Cardiology Office Visits: Recall 03/11/2022 with Oda Kilts, PA or Tommye Standard, [place].  Recall 02/07/2022 with Dr Johney Frame.   Copy of ICM check sent to Dr. Rayann Heman.  3 month ICM trend: 05/14/2022.    12-14 Month ICM trend:     Rosalene Billings, RN 05/15/2022 9:12 AM

## 2022-05-28 ENCOUNTER — Encounter: Payer: Self-pay | Admitting: Emergency Medicine

## 2022-05-28 ENCOUNTER — Ambulatory Visit (INDEPENDENT_AMBULATORY_CARE_PROVIDER_SITE_OTHER): Payer: No Typology Code available for payment source | Admitting: Emergency Medicine

## 2022-05-28 DIAGNOSIS — C3431 Malignant neoplasm of lower lobe, right bronchus or lung: Secondary | ICD-10-CM

## 2022-05-28 DIAGNOSIS — J449 Chronic obstructive pulmonary disease, unspecified: Secondary | ICD-10-CM | POA: Insufficient documentation

## 2022-05-28 DIAGNOSIS — Z720 Tobacco use: Secondary | ICD-10-CM

## 2022-05-28 NOTE — Assessment & Plan Note (Signed)
Expected improvement and scarring on most recent CT 03/17/2022.  He will continue to follow-up with Dr. Sondra Come to get serial CT scans as per their plans.

## 2022-05-28 NOTE — Progress Notes (Signed)
Subjective:    Patient ID: Juan Norris, male    DOB: [birth year], 71 y.o.   MRN: [id number]  HPI  ROV 09/25/21 --follow-up visit for 71 year old gentleman with a history of tobacco use, CAD with systolic CHF and AICD in place.  Also with hypertension and hyperlipidemia.  He was found to have a 2.2 x 1.2 right lower lobe pulmonary nodule that increased in size compared with 01/29/2021.  He underwent PET scan and PFT as below. He has seen both Dr Roxan Hockey and Dr Sondra Come. He is considering VATS resection, but he is now having second thoughts due to the associated risk and his cardiac disease. He is leaning now towards SBRT, tells me that he is concerned about the risks associated with thoracic surgery. That said we need to work on [facility]  PET scan 09/02/2021 reviewed by me shows that his right lower lobe pulmonary nodule is hypermetabolic.  There are some vague groundglass change in the left upper lobe that shows some very low level hypermetabolism as well, likely inflammatory.  I do not see any mediastinal or hilar lymphadenopathy.  Pulmonary function testing 08/26/2021 reviewed by me shows mild to moderate obstruction without a bronchodilator response, normal lung volumes, decreased few capacity.   ROV 05/28/22 --70 year old male with history of tobacco use and mild to moderate obstruction.  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 Sondra Come).  Most recent chest imaging 03/17/22 as below.  Not currently on bronchodilator therapy.  Today he reports that he is feeling well. Good appetite. Has remained fairly active, but has had to cut back some -  does not get SOB. No cough or wheeze.  Continues smoke approximately 1 cig a day - he ran out of nicotine patches.  He does not usually get the flu shot, did get 3 covid shots.   CT chest/4/23 reviewed by me shows interval decrease in size of his right lower lobe pulmonary nodule without any other suspicious  nodules or masses.  He has marked paraseptal emphysema especially at the apices with some bullous change.   Review of Systems As per HPI  Past Medical History:  Diagnosis Date   Acute renal insufficiency    Anginal pain (HCC)    Chronic lower back pain    "due to 3 ruptured discs in my back" (11/08/2018)   High cholesterol    History of kidney stones 1982   "passed them"   History of radiation therapy    Right lung- 11/03/21-11/14/21- Dr. Bubba Hales   Hypertension    Lung cancer [facility])    Myocardial infarction ([place]) 01/6377   Systolic heart failure ([place])    Tobacco use      Family History  Problem Relation Age of Onset   Leukemia Mother    Cancer Father    Cancer Sister    Cancer Brother    CAD Brother      Social History   Socioeconomic History   Marital status: Divorced    Spouse name: Not on file   Number of children: 1   Years of education: Not on file   Highest education level: Not on file  Occupational History   Not on file  Tobacco Use   Smoking status: Every Day    Packs/day: 1.50    Years: 47.00    Total pack years: 70.50    Types: Cigarettes   Smokeless tobacco: Never   Tobacco comments:  1 cigarette smoked daily 05/28/22 [facility]   Vaping Use   Vaping Use: Never used  Substance and Sexual Activity   Alcohol use: Not Currently    Comment: 11/08/2018 "stopped 09/23/1998"   Drug use: Not Currently    Comment: 11/08/2018 "nothing in the 2000s"   Sexual activity: Yes  Other Topics Concern   Not on file  Social History Narrative   Retired   Investment banker, operational of Radio broadcast assistant Strain: Not on file  Food Insecurity: Not on file  Transportation Needs: Not on file  Physical Activity: Not on file  Stress: Not on file  Social Connections: Not on file  Intimate Partner Violence: Not on file    Was in the [employer], medical x 20 yrs Went to [place], [place] in [place], [place], [place], [place]   No Known Allergies   Outpatient Medications Prior to  Visit  Medication Sig Dispense Refill   aspirin 81 MG EC tablet Take 1 tablet (81 mg total) by mouth daily. 30 tablet 6   atorvastatin (LIPITOR) 40 MG tablet Take 1 tablet (40 mg total) by mouth at bedtime.     carvedilol (COREG) 25 MG tablet Take 12.5 mg by mouth daily.     Multiple Vitamin (MULTIVITAMIN WITH MINERALS) TABS tablet Take 1 tablet by mouth daily.     sacubitril-valsartan (ENTRESTO) 24-26 MG Take 1 tablet by mouth 2 (two) times daily. Please make overdue appt with Dr. Meda Coffee before anymore refills. 1st attempt 60 tablet 0   spironolactone (ALDACTONE) 25 MG tablet Take 0.5 tablets (12.5 mg total) by mouth daily. 15 tablet 5   nicotine (NICODERM CQ - DOSED IN MG/24 HOURS) 14 mg/24hr patch APPLY 1 PATCH TO SKIN ONCE A DAY FOR NICOTINE REPLACEMENT THERAPY *APPLY TO NON-HAIRY, CLEAN, DRY AREA (NO TOBACCO PRODUCTS)* - USE FOR 6 WEEKS (Patient not taking: Reported on 05/28/2022)     No facility-administered medications prior to visit.          Objective:   Physical Exam Vitals:   05/28/22 0902  BP: 128/68  Pulse: 68  Temp: 97.9 F (36.6 C)  TempSrc: Oral  SpO2: 97%  Weight: 155 lb 3.2 oz (70.4 kg)  Height: 5\' 7"  (1.702 m)   Gen: Pleasant, thin gentleman, in no distress,  normal affect  ENT: No lesions,  mouth clear,  oropharynx clear, no postnasal drip  Neck: No JVD, no stridor  Lungs: No use of accessory muscles, no crackles or wheezing on normal respiration, no wheeze on forced expiration  Cardiovascular: RRR, heart sounds normal, no murmur or gallops, no peripheral edema  Musculoskeletal: No deformities, no cyanosis or clubbing  Neuro: alert, awake, non focal  Skin: Warm, no lesions or rash     Assessment & Plan:   Malignant neoplasm of bronchus of right lower lobe (HCC) Expected improvement and scarring on most recent CT 03/17/2022.  He will continue to follow-up with Dr. Sondra Come to get serial CT scans as per their plans.   COPD (chronic obstructive  pulmonary disease) (HCC) Mild to moderate obstruction on pulmonary function testing but asymptomatic.  No indication to initiate BD therapy at this time although he may needed in the future.  We will continue to follow with him annually.  Discussed flu shot and COVID vaccines with him today.  Tobacco use Still smoking sometimes, about 1 cigarette daily.  He is motivated to quit.  He is going to work on this with nicotine patches.   Baltazar Apo, MD,  PhD 05/28/2022, 9:16 AM [facility] [phone] or if no answer before 7:00PM call [phone] For any issues after 7:00PM please call eLink [phone]

## 2022-05-28 NOTE — Assessment & Plan Note (Signed)
Mild to moderate obstruction on pulmonary function testing but asymptomatic.  No indication to initiate BD therapy at this time although he may needed in the future.  We will continue to follow with him annually.  Discussed flu shot and COVID vaccines with him today.

## 2022-05-28 NOTE — Assessment & Plan Note (Signed)
Still smoking sometimes, about 1 cigarette daily.  He is motivated to quit.  He is going to work on this with nicotine patches.

## 2022-05-28 NOTE — Patient Instructions (Signed)
Continue to work on decreasing your cigarettes.  Goal will be to stop altogether. We will hold off on starting any inhaled medication at this time Follow-up with radiation oncology, Dr. Sondra Come, and get your repeat CT scan of the chest as per their plans. Follow with Dr. Lamonte Sakai in 12 months or sooner if you have any problems.

## 2022-06-17 ENCOUNTER — Ambulatory Visit (INDEPENDENT_AMBULATORY_CARE_PROVIDER_SITE_OTHER): Payer: Medicare PPO

## 2022-06-17 DIAGNOSIS — Z95810 Presence of automatic (implantable) cardiac defibrillator: Secondary | ICD-10-CM | POA: Diagnosis not present

## 2022-06-17 DIAGNOSIS — I5042 Chronic combined systolic (congestive) and diastolic (congestive) heart failure: Secondary | ICD-10-CM | POA: Diagnosis not present

## 2022-06-17 NOTE — Progress Notes (Signed)
EPIC Encounter for ICM Monitoring  Patient Name: Juan Norris is a 71 y.o. male Date: 06/17/2022 Primary Care Physican: [facility] Primary Cardiologist: Johney Frame Electrophysiologist: Allred Bi-V Pacing:   99.5%       01/29/2022 Weight: 158 lbs 05/15/2022 Weight: 164 lbs                                                            Spoke with patient and heart failure questions reviewed.  Pt asymptomatic for fluid accumulation.  Reports feeling well at this time and voices no complaints.  He ate sausage for the past couple of days which may have been high in salt.    Optivol Thoracic impedance suggesting possible fluid accumulation starting 7/3.    Prescribed: Spironolactone 25 mg take 0.5 tablet (12.5 mg total) daily   Labs: 10/13/2021 Creatinine 1.08, BUN 20, Potassium 3.9, Sodium 138 08/31/2021 Creatinine 1.10, BUN 11, Potassium 4.5, Sodium 142 02/12/2021 Creatinine 0.95, BUN 11, Potassium 4.4, Sodium 139, GFR 87 A complete set of results can be found in Results Review.   Recommendations:  Encouraged to limit salt intake.    Follow-up plan: ICM clinic phone appointment on 06/19/2022 to recheck fluid levels.   91 day device clinic remote transmission 08/12/2022.      EP/Cardiology Office Visits: Recall 03/11/2022 with Oda Kilts, PA or Tommye Standard, [place].  Recall 02/07/2022 with Dr Johney Frame.   Copy of ICM check sent to Dr. Rayann Heman.  3 month ICM trend: 06/17/2022.    12-14 Month ICM trend:     Rosalene Billings, RN 06/17/2022 1:33 PM

## 2022-06-19 ENCOUNTER — Ambulatory Visit (INDEPENDENT_AMBULATORY_CARE_PROVIDER_SITE_OTHER): Payer: Medicare PPO

## 2022-06-19 DIAGNOSIS — Z95810 Presence of automatic (implantable) cardiac defibrillator: Secondary | ICD-10-CM

## 2022-06-19 DIAGNOSIS — I5042 Chronic combined systolic (congestive) and diastolic (congestive) heart failure: Secondary | ICD-10-CM

## 2022-06-19 NOTE — Progress Notes (Signed)
EPIC Encounter for ICM Monitoring  Patient Name: Juan Norris is a 71 y.o. male Date: 06/19/2022 Primary Care Physican: [facility] Primary Cardiologist: Johney Frame Electrophysiologist: Allred Bi-V Pacing:   98.8%       01/29/2022 Weight: 158 lbs 05/15/2022 Weight: 164 lbs                                                            Spoke with patient and heart failure questions reviewed.  Pt asymptomatic for fluid accumulation.  Pt cooks at home and does not think he eats a lot of salt.    Optivol Thoracic impedance suggesting possible ongoing fluid accumulation starting 7/3.    Prescribed: Spironolactone 25 mg take 0.5 tablet (12.5 mg total) daily   Labs: 10/13/2021 Creatinine 1.08, BUN 20, Potassium 3.9, Sodium 138 08/31/2021 Creatinine 1.10, BUN 11, Potassium 4.5, Sodium 142 02/12/2021 Creatinine 0.95, BUN 11, Potassium 4.4, Sodium 139, GFR 87 A complete set of results can be found in Results Review.   Recommendations:  Recommendation to limit salt intake to 2000 mg daily and fluid intake to 64 oz daily.  Encouraged to call if experiencing any fluid symptoms.    Follow-up plan: ICM clinic phone appointment on 06/23/2022 to recheck fluid levels.   91 day device clinic remote transmission 08/12/2022.      EP/Cardiology Office Visits: Recall 03/11/2022 with Oda Kilts, PA or Tommye Standard, [place].  Recall 02/07/2022 with Dr Johney Frame.   Copy of ICM check sent to Dr. Rayann Heman.  3 month ICM trend: 06/19/2022.    12-14 Month ICM trend:     Rosalene Billings, RN 06/19/2022 2:51 PM

## 2022-06-23 ENCOUNTER — Ambulatory Visit (INDEPENDENT_AMBULATORY_CARE_PROVIDER_SITE_OTHER): Payer: Medicare PPO

## 2022-06-23 DIAGNOSIS — I5042 Chronic combined systolic (congestive) and diastolic (congestive) heart failure: Secondary | ICD-10-CM

## 2022-06-23 DIAGNOSIS — Z95810 Presence of automatic (implantable) cardiac defibrillator: Secondary | ICD-10-CM

## 2022-06-24 ENCOUNTER — Telehealth: Payer: Self-pay

## 2022-06-24 NOTE — Telephone Encounter (Signed)
I helped the patient send missed ICM transmission.

## 2022-06-25 NOTE — Progress Notes (Signed)
EPIC Encounter for ICM Monitoring  Patient Name: Juan Norris is a 71 y.o. male Date: 06/25/2022 Primary Care Physican: [facility] Primary Cardiologist: Johney Frame Electrophysiologist: Allred Bi-V Pacing:   99.8%       01/29/2022 Weight: 158 lbs 05/15/2022 Weight: 164 lbs                                                            Spoke with patient and heart failure questions reviewed.  Pt asymptomatic for fluid accumulation.  Reports feeling well at this time and voices no complaints.    Optivol Thoracic impedance suggesting fluid levels returned to normal.    Prescribed: Spironolactone 25 mg take 0.5 tablet (12.5 mg total) daily   Labs: 10/13/2021 Creatinine 1.08, BUN 20, Potassium 3.9, Sodium 138 08/31/2021 Creatinine 1.10, BUN 11, Potassium 4.5, Sodium 142 02/12/2021 Creatinine 0.95, BUN 11, Potassium 4.4, Sodium 139, GFR 87 A complete set of results can be found in Results Review.   Recommendations:  No changes and encouraged to call if experiencing any fluid symptoms.    Follow-up plan: ICM clinic phone appointment on 07/20/2022.   91 day device clinic remote transmission 08/12/2022.      EP/Cardiology Office Visits: Recall 03/11/2022 with Oda Kilts, PA or Tommye Standard, [place].  Recall 02/07/2022 with Dr Johney Frame.   Copy of ICM check sent to Dr. Rayann Heman.  3 month ICM trend: 06/25/2022.    12-14 Month ICM trend:     Rosalene Billings, RN 06/25/2022 7:57 AM

## 2022-07-20 ENCOUNTER — Ambulatory Visit (INDEPENDENT_AMBULATORY_CARE_PROVIDER_SITE_OTHER): Payer: Medicare PPO

## 2022-07-20 DIAGNOSIS — I5042 Chronic combined systolic (congestive) and diastolic (congestive) heart failure: Secondary | ICD-10-CM | POA: Diagnosis not present

## 2022-07-20 DIAGNOSIS — Z95810 Presence of automatic (implantable) cardiac defibrillator: Secondary | ICD-10-CM

## 2022-07-22 NOTE — Progress Notes (Signed)
EPIC Encounter for ICM Monitoring  Patient Name: Juan Norris is a 71 y.o. male Date: 07/22/2022 Primary Care Physican: [facility] Primary Cardiologist: Johney Frame Electrophysiologist: Allred Bi-V Pacing:   99.7%       01/29/2022 Weight: 158 lbs 05/15/2022 Weight: 164 lbs                                                            Spoke with patient and heart failure questions reviewed.  Pt asymptomatic for fluid accumulation.  Reports feeling well at this time and voices no complaints.     Optivol Thoracic impedance suggesting normal fluid levels.    Prescribed: Spironolactone 25 mg take 0.5 tablet (12.5 mg total) daily   Labs: 10/13/2021 Creatinine 1.08, BUN 20, Potassium 3.9, Sodium 138 08/31/2021 Creatinine 1.10, BUN 11, Potassium 4.5, Sodium 142 02/12/2021 Creatinine 0.95, BUN 11, Potassium 4.4, Sodium 139, GFR 87 A complete set of results can be found in Results Review.   Recommendations:  No changes and encouraged to call if experiencing any fluid symptoms.   Follow-up plan: ICM clinic phone appointment on 08/24/2022.   91 day device clinic remote transmission 08/12/2022.      EP/Cardiology Office Visits: Recall 03/11/2022 with Oda Kilts, PA or Tommye Standard, [place].  Recall 02/07/2022 with Dr Johney Frame.   Copy of ICM check sent to Dr. Rayann Heman.  3 month ICM trend: 07/20/2022.    12-14 Month ICM trend:     Rosalene Billings, RN 07/22/2022 9:25 AM

## 2022-08-12 ENCOUNTER — Ambulatory Visit (INDEPENDENT_AMBULATORY_CARE_PROVIDER_SITE_OTHER): Payer: Medicare PPO

## 2022-08-12 DIAGNOSIS — I428 Other cardiomyopathies: Secondary | ICD-10-CM | POA: Diagnosis not present

## 2022-08-15 LAB — CUP PACEART REMOTE DEVICE CHECK
Battery Remaining Longevity: 23 mo
Battery Voltage: 2.93 V
Brady Statistic AP VP Percent: 37.45 %
Brady Statistic AP VS Percent: 0.01 %
Brady Statistic AS VP Percent: 62.52 %
Brady Statistic AS VS Percent: 0.02 %
Brady Statistic RA Percent Paced: 37.24 %
Brady Statistic RV Percent Paced: 99.49 %
Date Time Interrogation Session: 20230831105649
HighPow Impedance: 62 Ohm
Implantable Lead Implant Date: 20191126
Implantable Lead Implant Date: 20191126
Implantable Lead Implant Date: 20191126
Implantable Lead Location: 753859
Implantable Lead Location: 753860
Implantable Lead Location: 753860
Implantable Lead Model: 3830
Implantable Lead Model: 5076
Implantable Pulse Generator Implant Date: 20191126
Lead Channel Impedance Value: 228 Ohm
Lead Channel Impedance Value: 285 Ohm
Lead Channel Impedance Value: 285 Ohm
Lead Channel Impedance Value: 304 Ohm
Lead Channel Impedance Value: 418 Ohm
Lead Channel Impedance Value: 437 Ohm
Lead Channel Pacing Threshold Amplitude: 0.375 V
Lead Channel Pacing Threshold Amplitude: 0.5 V
Lead Channel Pacing Threshold Amplitude: 0.625 V
Lead Channel Pacing Threshold Pulse Width: 0.4 ms
Lead Channel Pacing Threshold Pulse Width: 0.4 ms
Lead Channel Pacing Threshold Pulse Width: 0.4 ms
Lead Channel Sensing Intrinsic Amplitude: 2 mV
Lead Channel Sensing Intrinsic Amplitude: 2 mV
Lead Channel Sensing Intrinsic Amplitude: 9.5 mV
Lead Channel Sensing Intrinsic Amplitude: 9.5 mV
Lead Channel Setting Pacing Amplitude: 1.5 V
Lead Channel Setting Pacing Amplitude: 2.5 V
Lead Channel Setting Pacing Amplitude: 2.5 V
Lead Channel Setting Pacing Pulse Width: 0.4 ms
Lead Channel Setting Pacing Pulse Width: 0.4 ms
Lead Channel Setting Sensing Sensitivity: 0.3 mV

## 2022-08-24 ENCOUNTER — Ambulatory Visit (INDEPENDENT_AMBULATORY_CARE_PROVIDER_SITE_OTHER): Payer: Medicare PPO

## 2022-08-24 DIAGNOSIS — I5042 Chronic combined systolic (congestive) and diastolic (congestive) heart failure: Secondary | ICD-10-CM

## 2022-08-24 DIAGNOSIS — Z95810 Presence of automatic (implantable) cardiac defibrillator: Secondary | ICD-10-CM | POA: Diagnosis not present

## 2022-08-24 NOTE — Progress Notes (Unsigned)
EPIC Encounter for ICM Monitoring  Patient Name: Juan Norris is a 72 y.o. male Date: 08/24/2022 Primary Care Physican: [facility] Primary Cardiologist: Johney Frame Electrophysiologist: Curt Bears Bi-V Pacing:   99.8%       01/29/2022 Weight: 158 lbs 05/15/2022 Weight: 164 lbs 08/25/2022 Weight: Scales broke unable to weigh                                                            Spoke with patient and heart failure questions reviewed.  Pt asymptomatic for fluid accumulation.  He cooks at home and follows low salt diet.    Optivol Thoracic impedance suggesting possible fluid accumulation starting 8/5 and returned close to baseline.    Prescribed: Spironolactone 25 mg take 0.5 tablet (12.5 mg total) daily   Labs: 10/13/2021 Creatinine 1.08, BUN 20, Potassium 3.9, Sodium 138 08/31/2021 Creatinine 1.10, BUN 11, Potassium 4.5, Sodium 142 02/12/2021 Creatinine 0.95, BUN 11, Potassium 4.4, Sodium 139, GFR 87 A complete set of results can be found in Results Review.   Recommendations:  Recommendation to limit salt intake to 2000 mg daily and fluid intake to 64 oz daily.  Encouraged to call if experiencing any fluid symptoms.    Follow-up plan: ICM clinic phone appointment on 09/28/2022.   91 day device clinic remote transmission 11/11/2022.      EP/Cardiology Office Visits: Recall 03/11/2022 with Oda Kilts, PA or Tommye Standard, [place].  Recall 02/07/2022 with Dr Johney Frame.   Copy of ICM check sent to Dr. Curt Bears.   3 month ICM trend: 08/24/2022.    12-14 Month ICM trend:     Rosalene Billings, RN 08/24/2022 11:03 AM

## 2022-09-04 NOTE — Progress Notes (Signed)
Remote ICD transmission.   

## 2022-09-09 ENCOUNTER — Telehealth: Payer: Self-pay | Admitting: *Deleted

## 2022-09-09 NOTE — Telephone Encounter (Signed)
CALLED PATIENT TO INFORM OF CT FOR 09-22-22- ARRIVAL TIME- 10:45 AM @ [facility], NO RESTRICTIONS TO TEST, PATIENT TO RECEIVE RESULTS FROM DR. KINARD ON 09-24-22 @ 9:30 AM, SPOKE WITH PATIENT AND HE IS AWARE OF THESE APPTS. AND THE INSTRUCTIONS

## 2022-09-22 ENCOUNTER — Ambulatory Visit (HOSPITAL_COMMUNITY): Payer: Medicare PPO

## 2022-09-24 ENCOUNTER — Ambulatory Visit: Payer: Self-pay | Admitting: Radiation Oncology

## 2022-10-02 ENCOUNTER — Ambulatory Visit (HOSPITAL_COMMUNITY)
Admission: EM | Admit: 2022-10-02 | Discharge: 2022-10-02 | Disposition: A | Discharge status: Home / Self Care | Payer: Medicare PPO | Attending: Physician Assistant | Admitting: Physician Assistant

## 2022-10-02 ENCOUNTER — Encounter (HOSPITAL_COMMUNITY): Payer: Self-pay | Admitting: Emergency Medicine

## 2022-10-02 DIAGNOSIS — K047 Periapical abscess without sinus: Secondary | ICD-10-CM | POA: Diagnosis not present

## 2022-10-02 DIAGNOSIS — R519 Headache, unspecified: Secondary | ICD-10-CM

## 2022-10-02 MED ORDER — HYDROCODONE-ACETAMINOPHEN 5-325 MG PO TABS
1.0000 | ORAL_TABLET | ORAL | 0 refills | Status: DC | PRN
Start: 2022-10-02 — End: 2022-11-19

## 2022-10-02 MED ORDER — CEFTRIAXONE SODIUM 500 MG IJ SOLR
INTRAMUSCULAR | Status: AC
Start: 2022-10-02 — End: ?
  Filled 2022-10-02: qty 500

## 2022-10-02 MED ORDER — PENICILLIN V POTASSIUM 500 MG PO TABS: 500.0000 mg | ORAL_TABLET | Freq: Four times a day (QID) | ORAL | 0 refills | Status: AC

## 2022-10-02 MED ORDER — CEFTRIAXONE SODIUM 500 MG IJ SOLR
500.0000 mg | Freq: Once | INTRAMUSCULAR | Status: AC
  Administered 2022-10-02: 500 mg via INTRAMUSCULAR

## 2022-10-02 NOTE — ED Triage Notes (Signed)
Pt c/o left facial pain and swelling that gotten worse over over past couple days.

## 2022-10-02 NOTE — ED Provider Notes (Signed)
[facility]    CSN: [id number] Arrival date & time: 10/02/22  0946      History   Chief Complaint Chief Complaint  Patient presents with   Facial Pain    HPI Juan Norris is a 71 y.o. male.   71 year old male presents with facial swelling and tooth pain.  Patient indicates for the past couple days he has been having increasing left upper and lower tooth pain with swelling of the left facial area and cheek area.  Patient indicates he is having considerable dental pain and discomfort, he has been taking ibuprofen but this has not given him much relief from his discomfort.  He does indicate that he has contacted a 1 dental and is arranging for an appointment to be seen and evaluated first part of next week.  Patient denies fever, chills, nausea or vomiting.  Patient is concerned because this morning he woke up and he had considerable amount of swelling on the left side of the face accompanying the pain.     Past Medical History:  Diagnosis Date   Acute renal insufficiency    Anginal pain (HCC)    Chronic lower back pain    "due to 3 ruptured discs in my back" (11/08/2018)   High cholesterol    History of kidney stones 1982   "passed them"   History of radiation therapy    Right lung- 11/03/21-11/14/21- Dr. 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 ([place]) 10/17/2021   Left leg numbness 08/31/2021   CHF (congestive heart failure) ([place]) 08/31/2021   HTN (hypertension) 91/63/8466   Chronic systolic dysfunction of left ventricle 11/08/2018   Tobacco use 03/31/2018   Acute combined systolic and diastolic heart failure ([place]) 03/31/2018   Acute renal insufficiency 03/31/2018   Elevated troponin    NSTEMI  (non-ST elevated myocardial infarction) ([place]) 03/29/2018   CAD (coronary artery disease) 12/14/2012   Chest pain 04/07/2012   History of heart attack 04/07/2012    Past Surgical History:  Procedure Laterality Date   BIV ICD INSERTION CRT-D N/A 11/08/2018    Medtronic biventricular ICD implantation by Dr Rayann Heman with His Bundle pacing and a MDT 5993 lead placed into the LV port.    BRONCHIAL BIOPSY  10/13/2021   Procedure: BRONCHIAL BIOPSIES;  Surgeon: Collene Gobble, MD;  Location: [facility] ENDOSCOPY;  Service: Pulmonary;;   BRONCHIAL BRUSHINGS  10/13/2021   Procedure: BRONCHIAL BRUSHINGS;  Surgeon: Collene Gobble, MD;  Location: [facility] ENDOSCOPY;  Service: Pulmonary;;   BRONCHIAL NEEDLE ASPIRATION BIOPSY  10/13/2021   Procedure: BRONCHIAL NEEDLE ASPIRATION BIOPSIES;  Surgeon: Collene Gobble, MD;  Location: [place];  Service: Pulmonary;;   BRONCHIAL WASHINGS  10/13/2021   Procedure: BRONCHIAL WASHINGS;  Surgeon: Collene Gobble, MD;  Location: [place];  Service: Pulmonary;;   CARDIAC CATHETERIZATION     FIDUCIAL MARKER PLACEMENT  10/13/2021   Procedure: FIDUCIAL MARKER PLACEMENT;  Surgeon: Collene Gobble, MD;  Location: [facility] ENDOSCOPY;  Service: Pulmonary;;   INGUINAL HERNIA REPAIR Bilateral    KNEE SURGERY Left 1990s   "growth on the back of my knee removed"   LEFT HEART CATH AND CORONARY ANGIOGRAPHY N/A 03/30/2018  Procedure: LEFT HEART CATH AND CORONARY ANGIOGRAPHY;  Surgeon: Burnell Blanks, MD;  Location: [facility];  Service: Cardiovascular;  Laterality: N/A;   VIDEO BRONCHOSCOPY WITH ENDOBRONCHIAL NAVIGATION N/A 10/13/2021   Procedure: ROBOTIC VIDEO BRONCHOSCOPY WITH ENDOBRONCHIAL NAVIGATION;  Surgeon: Collene Gobble, MD;  Location: [facility] ENDOSCOPY;  Service: Pulmonary;  Laterality: N/A;   VIDEO BRONCHOSCOPY WITH RADIAL ENDOBRONCHIAL ULTRASOUND  10/13/2021   Procedure: RADIAL ENDOBRONCHIAL ULTRASOUND;  Surgeon: Collene Gobble, MD;  Location: [facility] ENDOSCOPY;  Service:  Pulmonary;;       Home Medications    Prior to Admission medications   Medication Sig Start Date End Date Taking? Authorizing Provider  HYDROcodone-acetaminophen (NORCO/VICODIN) 5-325 MG tablet Take 1-2 tablets by mouth every 4 (four) hours as needed. 10/02/22  Yes Nyoka Lint, PA-C  penicillin v potassium (VEETID) 500 MG tablet Take 1 tablet (500 mg total) by mouth 4 (four) times daily for 10 days. 10/02/22 10/12/22 Yes Nyoka Lint, PA-C  aspirin 81 MG EC tablet Take 1 tablet (81 mg total) by mouth daily. 11/09/18   Baldwin Jamaica, PA-C  atorvastatin (LIPITOR) 40 MG tablet Take 1 tablet (40 mg total) by mouth at bedtime. 10/13/21   Collene Gobble, MD  carvedilol (COREG) 25 MG tablet Take 12.5 mg by mouth daily.    [provider]  Multiple Vitamin (MULTIVITAMIN WITH MINERALS) TABS tablet Take 1 tablet by mouth daily.    [provider]  nicotine (NICODERM CQ - DOSED IN MG/24 HOURS) 14 mg/24hr patch APPLY 1 PATCH TO SKIN ONCE A DAY FOR NICOTINE REPLACEMENT THERAPY *APPLY TO NON-HAIRY, CLEAN, DRY AREA (NO TOBACCO PRODUCTS)* - USE FOR 6 WEEKS Patient not taking: Reported on 05/28/2022 02/19/22   [provider]  sacubitril-valsartan (ENTRESTO) 24-26 MG Take 1 tablet by mouth 2 (two) times daily. Please make overdue appt with Dr. Meda Coffee before anymore refills. 1st attempt 08/29/20   Dorothy Spark, MD  spironolactone (ALDACTONE) 25 MG tablet Take 0.5 tablets (12.5 mg total) by mouth daily. 03/11/20   Dorothy Spark, MD    Family History Family History  Problem Relation Age of Onset   Leukemia Mother    Cancer Father    Cancer Sister    Cancer Brother    CAD Brother     Social History Social History   Tobacco Use   Smoking status: Every Day    Packs/day: 1.50    Years: 47.00    Total pack years: 70.50    Types: Cigarettes   Smokeless tobacco: Never   Tobacco comments:    1 cigarette smoked daily 05/28/22 [facility]   Vaping Use   Vaping Use: Never  used  Substance Use Topics   Alcohol use: Not Currently    Comment: 11/08/2018 "stopped 09/23/1998"   Drug use: Not Currently    Comment: 11/08/2018 "nothing in the 2000s"     Allergies   Patient has no known allergies.   Review of Systems Review of Systems  HENT:  Positive for dental problem (dental caries and broken teeth left side).      Physical Exam Triage Vital Signs ED Triage Vitals  Enc Vitals Group     BP 10/02/22 1041 (!) 108/53     Pulse Rate 10/02/22 1041 72     Resp 10/02/22 1041 18     Temp 10/02/22 1041 98.3 F (36.8 C)     Temp Source 10/02/22 1041 Oral     SpO2 10/02/22 1041 99 %  Weight --      Height --      Head Circumference --      Peak Flow --      Pain Score 10/02/22 1040 10     Pain Loc --      Pain Edu? --      Excl. in [place]? --    No data found.  Updated Vital Signs BP (!) 108/53 (BP Location: Left Arm)   Pulse 72   Temp 98.3 F (36.8 C) (Oral)   Resp 18   SpO2 99%   Visual Acuity Right Eye Distance:   Left Eye Distance:   Bilateral Distance:    Right Eye Near:   Left Eye Near:    Bilateral Near:     Physical Exam Constitutional:      Appearance: Normal appearance.  HENT:     Right Ear: Tympanic membrane and ear canal normal.     Left Ear: Tympanic membrane and ear canal normal.     Mouth/Throat:     Mouth: Mucous membranes are moist.     Pharynx: Oropharynx is clear.     Comments: Mouth: There are several broken teeth on the upper and lower bridge with associated swelling and abscess formation of the upper bridge in the lower bridge without drainage. Face: There is 1+ swelling along the left lower jaw area with swelling and tenderness on palpation at the abscess site, left upper bridge with cheek swelling and tenderness on palpation of the abscess formation site.  There is no redness of the facial area noted. Neurological:     Mental Status: He is alert.      UC Treatments / Results  Labs (all labs ordered are  listed, but only abnormal results are displayed) Labs Reviewed - No data to display  EKG   Radiology No results found.  Procedures Procedures (including critical care time)  Medications Ordered in UC Medications  cefTRIAXone (ROCEPHIN) injection 500 mg (500 mg Intramuscular Given 10/02/22 1103)    Initial Impression / Assessment and Plan / UC Course  I have reviewed the triage vital signs and the nursing notes.  Pertinent labs & imaging results that were available during my care of the patient were reviewed by me and considered in my medical decision making (see chart for details).    Plan: 1.  The dental abscess will be treated with the following: A.  Rocephin 500 mg IM to treat the acute infectious formation. B.  Pen-Vee K 500 mg every 6 hours to treat the abscess infectious formation. C.  Patient advised to obtain appoint with a 1 dental first part of next week to be evaluated. 2.  The facial pain will be treated with the following: A.  Vicodin tablets, 1-2 every 6-8 hours as needed for pain relief. 3.  Patient advised to follow-up with PCP return to urgent care if symptoms fail to improve. Final Clinical Impressions(s) / UC Diagnoses   Final diagnoses:  Dental abscess  Facial pain     Discharge Instructions      Advised to take the Vicodin tablets, 1-2 every 6-8 hours as needed for pain.  Be aware this medication may make you drowsy so use with caution.  Make sure to drink plenty of fluids with this medication as it may tend to cause constipation. Advised to take the penicillin VK 500 mg, every 6 hours until completed to treat the infection. Advised to arrange an appointment with a 1 dental to have the  area evaluated and any tooth problems addressed. Advised to follow-up with PCP or return to urgent care as needed.    ED Prescriptions     Medication Sig Dispense Auth. Provider   HYDROcodone-acetaminophen (NORCO/VICODIN) 5-325 MG tablet Take 1-2 tablets by mouth  every 4 (four) hours as needed. 16 tablet Nyoka Lint, PA-C   penicillin v potassium (VEETID) 500 MG tablet Take 1 tablet (500 mg total) by mouth 4 (four) times daily for 10 days. 40 tablet Nyoka Lint, PA-C      I have reviewed the PDMP during this encounter.   Nyoka Lint, PA-C 10/02/22 1107

## 2022-10-02 NOTE — Discharge Instructions (Addendum)
Advised to take the Vicodin tablets, 1-2 every 6-8 hours as needed for pain.  Be aware this medication may make you drowsy so use with caution.  Make sure to drink plenty of fluids with this medication as it may tend to cause constipation. Advised to take the penicillin VK 500 mg, every 6 hours until completed to treat the infection. Advised to arrange an appointment with a 1 dental to have the area evaluated and any tooth problems addressed. Advised to follow-up with PCP or return to urgent care as needed.

## 2022-10-05 ENCOUNTER — Ambulatory Visit (INDEPENDENT_AMBULATORY_CARE_PROVIDER_SITE_OTHER): Payer: Medicare PPO

## 2022-10-05 DIAGNOSIS — Z95810 Presence of automatic (implantable) cardiac defibrillator: Secondary | ICD-10-CM

## 2022-10-05 DIAGNOSIS — I5042 Chronic combined systolic (congestive) and diastolic (congestive) heart failure: Secondary | ICD-10-CM | POA: Diagnosis not present

## 2022-10-05 NOTE — Progress Notes (Signed)
EPIC Encounter for ICM Monitoring  Patient Name: Juan Norris is a 71 y.o. male Date: 10/05/2022 Primary Care Physican: [facility] Primary Cardiologist: Johney Frame Electrophysiologist: Curt Bears Bi-V Pacing:   99.4%       01/29/2022 Weight: 158 lbs 05/15/2022 Weight: 164 lbs 08/25/2022 Weight: Scales broke unable to weigh                                                            Transmission reviewed.     Optivol Thoracic impedance suggesting possible fluid accumulation starting 8/5 and returned close to baseline.    Prescribed: Spironolactone 25 mg take 0.5 tablet (12.5 mg total) daily   Labs: 10/13/2021 Creatinine 1.08, BUN 20, Potassium 3.9, Sodium 138 08/31/2021 Creatinine 1.10, BUN 11, Potassium 4.5, Sodium 142 02/12/2021 Creatinine 0.95, BUN 11, Potassium 4.4, Sodium 139, GFR 87 A complete set of results can be found in Results Review.   Recommendations:  No changes.   Follow-up plan: ICM clinic phone appointment on 11/09/2022.   91 day device clinic remote transmission 11/11/2022.      EP/Cardiology Office Visits: Recall 03/11/2022 with Oda Kilts, PA or Tommye Standard, [place].  Recall 02/07/2022 with Dr Johney Frame.   Copy of ICM check sent to Dr. Curt Bears.    3 month ICM trend: 10/05/2022.    12-14 Month ICM trend:     Rosalene Billings, RN 10/05/2022 2:36 PM

## 2022-11-09 ENCOUNTER — Ambulatory Visit (INDEPENDENT_AMBULATORY_CARE_PROVIDER_SITE_OTHER): Payer: Medicare PPO

## 2022-11-09 DIAGNOSIS — I5042 Chronic combined systolic (congestive) and diastolic (congestive) heart failure: Secondary | ICD-10-CM | POA: Diagnosis not present

## 2022-11-09 DIAGNOSIS — Z95810 Presence of automatic (implantable) cardiac defibrillator: Secondary | ICD-10-CM

## 2022-11-10 NOTE — Progress Notes (Signed)
EPIC Encounter for ICM Monitoring  Patient Name: Juan Norris is a 71 y.o. male Date: 11/10/2022 Primary Care Physican: [facility] Primary Cardiologist: Johney Frame Electrophysiologist: Curt Bears Bi-V Pacing:   99.6%       01/29/2022 Weight: 158 lbs 05/15/2022 Weight: 164 lbs 08/25/2022 Weight: Scales broke unable to weigh                                                            Spoke with patient and heart failure questions reviewed.  Transmission results reviewed.  Pt asymptomatic for fluid accumulation.  Reports feeling well at this time and voices no complaints.    Diet:  Pt cooks at home and limits salt intake.    Optivol Thoracic impedance suggesting possible fluid accumulation starting 10/27 and starting return to baseline.  Fluid index greater than normal threshold starting 11/23.   Prescribed: Spironolactone 25 mg take 0.5 tablet (12.5 mg total) daily   Labs: 10/13/2021 Creatinine 1.08, BUN 20, Potassium 3.9, Sodium 138 08/31/2021 Creatinine 1.10, BUN 11, Potassium 4.5, Sodium 142 02/12/2021 Creatinine 0.95, BUN 11, Potassium 4.4, Sodium 139, GFR 87 A complete set of results can be found in Results Review.   Recommendations:   Recommendation to limit salt intake to 2000 mg daily and fluid intake to 64 oz daily.  Encouraged to call if experiencing any fluid symptoms.    Follow-up plan: ICM clinic phone appointment on 11/17/2022 to recheck fluid levels.   91 day device clinic remote transmission 11/11/2022.      EP/Cardiology Office Visits: Recall 03/11/2022 with Oda Kilts, PA or Tommye Standard, [place].  Recall 02/07/2022 with Dr Johney Frame.   Copy of ICM check sent to Dr. Curt Bears.    3 month ICM trend: 11/09/2022.    12-14 Month ICM trend:     Rosalene Billings, RN 11/10/2022 12:43 PM

## 2022-11-11 ENCOUNTER — Ambulatory Visit (INDEPENDENT_AMBULATORY_CARE_PROVIDER_SITE_OTHER): Payer: Medicare PPO

## 2022-11-11 DIAGNOSIS — I428 Other cardiomyopathies: Secondary | ICD-10-CM

## 2022-11-11 LAB — CUP PACEART REMOTE DEVICE CHECK
Battery Remaining Longevity: 22 mo
Battery Voltage: 2.92 V
Brady Statistic AP VP Percent: 46.03 %
Brady Statistic AP VS Percent: 0.01 %
Brady Statistic AS VP Percent: 53.96 %
Brady Statistic AS VS Percent: 0 %
Brady Statistic RA Percent Paced: 45.91 %
Brady Statistic RV Percent Paced: 99.78 %
Date Time Interrogation Session: 20231129061804
HighPow Impedance: 52 Ohm
Implantable Lead Connection Status: 753985
Implantable Lead Connection Status: 753985
Implantable Lead Connection Status: 753985
Implantable Lead Implant Date: 20191126
Implantable Lead Implant Date: 20191126
Implantable Lead Implant Date: 20191126
Implantable Lead Location: 753859
Implantable Lead Location: 753860
Implantable Lead Location: 753860
Implantable Lead Model: 3830
Implantable Lead Model: 5076
Implantable Pulse Generator Implant Date: 20191126
Lead Channel Impedance Value: 228 Ohm
Lead Channel Impedance Value: 266 Ohm
Lead Channel Impedance Value: 285 Ohm
Lead Channel Impedance Value: 304 Ohm
Lead Channel Impedance Value: 399 Ohm
Lead Channel Impedance Value: 437 Ohm
Lead Channel Pacing Threshold Amplitude: 0.375 V
Lead Channel Pacing Threshold Amplitude: 0.5 V
Lead Channel Pacing Threshold Amplitude: 0.625 V
Lead Channel Pacing Threshold Pulse Width: 0.4 ms
Lead Channel Pacing Threshold Pulse Width: 0.4 ms
Lead Channel Pacing Threshold Pulse Width: 0.4 ms
Lead Channel Sensing Intrinsic Amplitude: 2 mV
Lead Channel Sensing Intrinsic Amplitude: 2 mV
Lead Channel Sensing Intrinsic Amplitude: 9.625 mV
Lead Channel Sensing Intrinsic Amplitude: 9.625 mV
Lead Channel Setting Pacing Amplitude: 1.5 V
Lead Channel Setting Pacing Amplitude: 2.5 V
Lead Channel Setting Pacing Amplitude: 2.5 V
Lead Channel Setting Pacing Pulse Width: 0.4 ms
Lead Channel Setting Pacing Pulse Width: 0.4 ms
Lead Channel Setting Sensing Sensitivity: 0.3 mV
Zone Setting Status: 755011
Zone Setting Status: 755011

## 2022-11-17 ENCOUNTER — Ambulatory Visit (INDEPENDENT_AMBULATORY_CARE_PROVIDER_SITE_OTHER): Payer: Medicare PPO

## 2022-11-17 DIAGNOSIS — Z95810 Presence of automatic (implantable) cardiac defibrillator: Secondary | ICD-10-CM

## 2022-11-17 DIAGNOSIS — I5042 Chronic combined systolic (congestive) and diastolic (congestive) heart failure: Secondary | ICD-10-CM

## 2022-11-17 NOTE — Progress Notes (Signed)
EPIC Encounter for ICM Monitoring  Patient Name: Juan Norris is a 71 y.o. male Date: 11/17/2022 Primary Care Physican: [facility] Primary Cardiologist: Johney Frame Electrophysiologist: Curt Bears Bi-V Pacing:   99.7%       01/29/2022 Weight: 158 lbs 05/15/2022 Weight: 164 lbs 08/25/2022 Weight: Scales broke unable to weigh                                                            Spoke with patient and heart failure questions reviewed.  Transmission results reviewed.  Pt asymptomatic for fluid accumulation.  Reports feeling well at this time and voices no complaints.     Diet:  Pt cooks at home and limits salt intake.    Optivol Thoracic impedance continues suggesting possible fluid accumulation starting 10/27 and starting return to baseline.  Fluid index greater than normal threshold starting 11/23.   Prescribed: Spironolactone 25 mg take 0.5 tablet (12.5 mg total) daily   Labs: 10/13/2021 Creatinine 1.08, BUN 20, Potassium 3.9, Sodium 138 08/31/2021 Creatinine 1.10, BUN 11, Potassium 4.5, Sodium 142 02/12/2021 Creatinine 0.95, BUN 11, Potassium 4.4, Sodium 139, GFR 87 A complete set of results can be found in Results Review.   Recommendations:   Recommendation to limit salt intake to 2000 mg daily and fluid intake to 64 oz daily.  Encouraged to call if experiencing any fluid symptoms.    Follow-up plan: ICM clinic phone appointment on 12/02/2022 to recheck fluid levels.   91 day device clinic remote transmission 02/10/2023.      EP/Cardiology Office Visits: Recall 03/11/2022 with Oda Kilts, PA or Tommye Standard, [place].  Recall 02/07/2022 with Dr Johney Frame.   Copy of ICM check sent to Dr. Curt Bears and Dr Johney Frame for review and recommendations if needed.    3 month ICM trend: 11/17/2022.    12-14 Month ICM trend:     Rosalene Billings, RN 11/17/2022 4:15 PM

## 2022-11-18 ENCOUNTER — Telehealth: Payer: Self-pay | Admitting: *Deleted

## 2022-11-18 NOTE — Progress Notes (Unsigned)
Office Visit    Patient Name: Juan Norris Date of Encounter: 11/19/2022  Primary Care Provider:  [facility], [facility] Primary Cardiologist:  Ena Dawley, MD Primary Electrophysiologist: Thompson Grayer, MD  Chief Complaint    Juan Norris is a 71 y.o. male with PMH of CAD s/p NSTEMI 03/2016 with nonobstructive CAD, NICM s/p BiV ICD 10/2018, HTN, HLD, chronic combined systolic and diastolic CHF, tobacco abuse who presents today for possible volume overload.  Past Medical History    Past Medical History:  Diagnosis Date   Acute renal insufficiency    Anginal pain (HCC)    Chronic lower back pain    "due to 3 ruptured discs in my back" (11/08/2018)   High cholesterol    History of kidney stones 1982   "passed them"   History of radiation therapy    Right lung- 11/03/21-11/14/21- Dr. Bubba Hales   Hypertension    Lung cancer [facility])    Myocardial infarction ([place]) 23/3007   Systolic heart failure ([place])    Tobacco use    Past Surgical History:  Procedure Laterality Date   BIV ICD INSERTION CRT-D N/A 11/08/2018    Medtronic biventricular ICD implantation by Dr Rayann Heman with His Bundle pacing and a MDT 6226 lead placed into the LV port.    BRONCHIAL BIOPSY  10/13/2021   Procedure: BRONCHIAL BIOPSIES;  Surgeon: Collene Gobble, MD;  Location: [facility] ENDOSCOPY;  Service: Pulmonary;;   BRONCHIAL BRUSHINGS  10/13/2021   Procedure: BRONCHIAL BRUSHINGS;  Surgeon: Collene Gobble, MD;  Location: [facility] ENDOSCOPY;  Service: Pulmonary;;   BRONCHIAL NEEDLE ASPIRATION BIOPSY  10/13/2021   Procedure: BRONCHIAL NEEDLE ASPIRATION BIOPSIES;  Surgeon: Collene Gobble, MD;  Location: [place];  Service: Pulmonary;;   BRONCHIAL WASHINGS  10/13/2021   Procedure: BRONCHIAL WASHINGS;  Surgeon: Collene Gobble, MD;  Location: [place];  Service: Pulmonary;;   CARDIAC CATHETERIZATION     FIDUCIAL MARKER PLACEMENT  10/13/2021   Procedure: FIDUCIAL MARKER PLACEMENT;  Surgeon: Collene Gobble, MD;   Location: [facility] ENDOSCOPY;  Service: Pulmonary;;   INGUINAL HERNIA REPAIR Bilateral    KNEE SURGERY Left 1990s   "growth on the back of my knee removed"   LEFT HEART CATH AND CORONARY ANGIOGRAPHY N/A 03/30/2018   Procedure: LEFT HEART CATH AND CORONARY ANGIOGRAPHY;  Surgeon: Burnell Blanks, MD;  Location: [place] CV LAB;  Service: Cardiovascular;  Laterality: N/A;   VIDEO BRONCHOSCOPY WITH ENDOBRONCHIAL NAVIGATION N/A 10/13/2021   Procedure: ROBOTIC VIDEO BRONCHOSCOPY WITH ENDOBRONCHIAL NAVIGATION;  Surgeon: Collene Gobble, MD;  Location: [place] ENDOSCOPY;  Service: Pulmonary;  Laterality: N/A;   VIDEO BRONCHOSCOPY WITH RADIAL ENDOBRONCHIAL ULTRASOUND  10/13/2021   Procedure: RADIAL ENDOBRONCHIAL ULTRASOUND;  Surgeon: Collene Gobble, MD;  Location: [facility] ENDOSCOPY;  Service: Pulmonary;;    Allergies  No Known Allergies  History of Present Illness    Juan Norris  is a 71 year old male with the above mention past medical history who presents today for possible volume overload and congestive heart failure.  Juan Norris was initially seen during an admission in 2013 for chest pain.  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  He was transported to the ED and EMS treated with nitroglycerin in the field with reduction of discomfort.  EKG revealed anterior septal Q waves with left axis deviation and intraventricular conduction delay.  He underwent 2D echo that revealed EF of  40-45% with mid to distal anterior septal wall akinesis and mildly dilated LV.  Rest and exercise SPECT stress test was completed revealing ischemia in the apical septal region with recommendation for LHC for further evaluation.  He was discharged and underwent outpatient LHC that revealed angiographically normal coronaries and catheter induced spasm of LAD during cath, which improved with intracoronary nitroglycerine.  He was seen in 2019 in the ED for complaint of  substernal chest pain similar to previous complaint.  Troponins were abnormal and he was started on heparin drip and LHC was performed that revealed 40% stenosis ostial second marginal lesion with 20% stenosis proximal LAD with indication of possible vasospasm as presenting factor.  2D echo was also performed that showed reduced LV function of 20-25% with diffuse hypokinesis.   He was referred to the heart failure clinic due to nonischemic cardiomyopathy management.  During visit patient had exertional dyspnea with mild to moderate activities.  He was started on Toprol XL with with plan to further titrate GDMT if blood pressure permits.  He was referred to EP due to inability to titrate GDMT and patient underwent implant of Medtronic CRT-D by Dr. Rayann Heman on 10/2018.  Repeat 2D echo and 2020 showed EF of 20 to 25% with functional class IIa improved slightly from previous 2D echo.  He was last seen in office on 02/2021 by Estella Husk, PA.  He reported no ICD firings and was continued on Entresto, metoprolol, spironolactone and was euvolemic on exam.  He was seen for surgical clearance visit on 08/16/2021 by Oda Kilts, PA and patient was stable with normal ICD function and was euvolemic.  2D echo was updated and revealed no change with depressed EF of 25-30% with grade 1 DD and global hypokinesis.  Patient was contacted 10/2022 with possible fluid accumulation by OptiVol settings on his device.  He reported being asymptomatic and voices no complaints when contacted.   Juan Norris presents today for complaint of lower extremity swelling and increased impedance on OptiVol setting of device.  Since last being seen in the office patient reports that he has no complaints of chest pain but has noticed increased welling in his right lower extremity.  During examination patient was euvolemic with no reports of shortness of breath or orthopnea.  His blood pressure today was well-controlled at 110/60 and heart rate was 64 bpm.   He does report indiscretions with sodium that occurred during Thanksgiving and recently due to inability to chew food with poor dentition.  He is compliant with his current medication regimen and denies any adverse reactions or missed medication doses.  During the visit we discussed and reviewed the importance of abstaining from excess salt in his diet.  He was also encouraged to perform daily weights in order to determine if volume status is stable.  Patient denies chest pain, palpitations, dyspnea, PND, orthopnea, nausea, vomiting, dizziness, syncope, edema, weight gain, or early satiety.  Home Medications    Current Outpatient Medications  Medication Sig Dispense Refill   aspirin 81 MG EC tablet Take 1 tablet (81 mg total) by mouth daily. 30 tablet 6   atorvastatin (LIPITOR) 40 MG tablet Take 1 tablet (40 mg total) by mouth at bedtime.     carvedilol (COREG) 25 MG tablet Take 12.5 mg by mouth daily.     Multiple Vitamin (MULTIVITAMIN WITH MINERALS) TABS tablet Take 1 tablet by mouth daily.     nicotine (NICODERM CQ - DOSED IN MG/24 HOURS) 14 mg/24hr patch  sacubitril-valsartan (ENTRESTO) 24-26 MG Take 1 tablet by mouth 2 (two) times daily. Please make overdue appt with Dr. Meda Coffee before anymore refills. 1st attempt 60 tablet 0   spironolactone (ALDACTONE) 25 MG tablet Take 0.5 tablets (12.5 mg total) by mouth daily. 15 tablet 5   No current facility-administered medications for this visit.     Review of Systems  Please see the history of present illness.    (+) Lower extremity edema  All other systems reviewed and are otherwise negative except as noted above.  Physical Exam    Wt Readings from Last 3 Encounters:  11/19/22 150 lb (68 kg)  05/28/22 155 lb 3.2 oz (70.4 kg)  03/23/22 159 lb 12.8 oz (72.5 kg)   VS: Vitals:   11/19/22 0830  BP: 110/60  Pulse: 64  SpO2: 95%  ,Body mass index is 23.49 kg/m.  Constitutional:      Appearance: Healthy appearance. Not in distress.   Neck:     Vascular: JVD normal.  Pulmonary:     Effort: Pulmonary effort is normal.     Breath sounds: No wheezing. No rales. Diminished in the bases Cardiovascular:     Normal rate. Regular rhythm. Normal S1. Normal S2.      Murmurs: There is no murmur.  Edema:    Peripheral edema absent.  Abdominal:     Palpations: Abdomen is soft non tender. There is no hepatomegaly.  Skin:    General: Skin is warm and dry.  Neurological:     General: No focal deficit present.     Mental Status: Alert and oriented to person, place and time.     Cranial Nerves: Cranial nerves are intact.  EKG/LABS/Other Studies Reviewed    ECG personally reviewed by me today -atrial sensed and ventricular paced biventricular pacemaker detected with rate of 64 bpm and no acute changes noted.    Lab Results  Component Value Date   WBC 10.6 (H) 10/13/2021   HGB 13.3 10/13/2021   HCT 39.6 10/13/2021   MCV 91.5 10/13/2021   PLT 171 10/13/2021   Lab Results  Component Value Date   CREATININE 1.08 10/13/2021   BUN 20 10/13/2021   NA 138 10/13/2021   K 3.9 10/13/2021   CL 107 10/13/2021   CO2 23 10/13/2021   Lab Results  Component Value Date   ALT 21 08/31/2021   AST 25 08/31/2021   ALKPHOS 81 08/31/2021   BILITOT 0.7 08/31/2021   Lab Results  Component Value Date   CHOL 93 (L) 02/12/2021   HDL 33 (L) 02/12/2021   LDLCALC 48 02/12/2021   TRIG 44 02/12/2021   CHOLHDL 2.8 02/12/2021    Lab Results  Component Value Date   HGBA1C 6.2 (H) 08/31/2021    Assessment & Plan    1.  NICM/chronic combined systolic and diastolic CHF: -s/p Medtronic CRT-D implant in 2019 for reduced EF of 20-25%, -2D echo was repeated 09/2021 with EF still depressed at 25-30% -Today patient presents with complaint of lower extremity edema and increased volume on Paceart report -He reports some sodium indiscretions that occurred during Thanksgiving holiday. -BNP and BMET today -We will add Lasix 20 mg twice daily x 3  days and then 20 mg as needed -BMET and magnesium in 2 weeks -Continue current GDMT with Entresto 24/26 mg twice daily, Aldactone 12.5 mg daily, carvedilol 12.5 mg twice daily Low sodium diet, fluid restriction <2L, and daily weights encouraged. Educated to contact our office for weight gain  of 2 lbs overnight or 5 lbs in one week.   2.  Nonobstructive CAD: -s/p NSTEMI 03/2016 and repeat cath 2019 with possible vasospasm as presenting factor. -Today patient reports no chest pain or anginal equivalent today. -Continue current GDMT with ASA 81 mg, carvedilol, Lipitor 40 mg  3.  History of COPD: -Patient reports no increased shortness of breath and is compliant with current medication regimen  4.  Essential hypertension: -Today patient's blood pressure was well-controlled today at 110/60 -Continue carvedilol, spironolactone 12.5 mg  Disposition: Follow-up with Ena Dawley, MD or APP in 4 months    Medication Adjustments/Labs and Tests Ordered: Current medicines are reviewed at length with the patient today.  Concerns regarding medicines are outlined above.   Signed, Mable Fill, Marissa Nestle, NP 11/19/2022, 8:48 AM [facility]  Note:  This document was prepared using Dragon voice recognition software and may include unintentional dictation errors.

## 2022-11-18 NOTE — Progress Notes (Signed)
  Received: Today Nuala Alpha, LPN  Freada Bergeron, MD; Slade Pierpoint Panda, RN Got him an appt with Ambrose Pancoast NP for tomorrow for this.  Thanks [employer]

## 2022-11-18 NOTE — Telephone Encounter (Signed)
  Message Received: Therese Sarah, MD  Nuala Alpha, LPN Don't think I have ever seen this patient but sounds like he may need to be seen given concern for volume overload.       Previous Messages    ----- Message ----- From: Rosalene Billings, RN Sent: 11/17/2022   4:21 PM EST To: Freada Bergeron, MD  Sent for review and recommendations if needed.  Thank you.         pt needs an appt with Dr. Johney Frame or an APP for sometime soon--OVERDUE Received: Today [place], Lonna Cobb, LPN Patient scheduled for tomorrow at 8:50am with Ambrose Pancoast, NP

## 2022-11-19 ENCOUNTER — Encounter: Payer: Self-pay | Admitting: Nurse Practitioner

## 2022-11-19 ENCOUNTER — Ambulatory Visit: Payer: Medicare PPO | Attending: Nurse Practitioner | Admitting: Nurse Practitioner

## 2022-11-19 VITALS — BP 110/60 | HR 64 | Ht 67.0 in | Wt 150.0 lb

## 2022-11-19 DIAGNOSIS — I519 Heart disease, unspecified: Secondary | ICD-10-CM | POA: Diagnosis not present

## 2022-11-19 DIAGNOSIS — I2510 Atherosclerotic heart disease of native coronary artery without angina pectoris: Secondary | ICD-10-CM | POA: Diagnosis not present

## 2022-11-19 DIAGNOSIS — I10 Essential (primary) hypertension: Secondary | ICD-10-CM | POA: Diagnosis not present

## 2022-11-19 DIAGNOSIS — J449 Chronic obstructive pulmonary disease, unspecified: Secondary | ICD-10-CM

## 2022-11-19 MED ORDER — FUROSEMIDE 20 MG PO TABS: 20.0000 mg | ORAL_TABLET | ORAL | 0 refills | Status: DC

## 2022-11-19 MED ORDER — POTASSIUM CHLORIDE ER 10 MEQ PO TBCR: 10.0000 meq | EXTENDED_RELEASE_TABLET | ORAL | 0 refills | Status: DC

## 2022-11-19 NOTE — Patient Instructions (Addendum)
Medication Instructions:  START Lasix 20mg  Take 1 tablet twice a day for 3 days then take as needed START Potassium 82mEq Take 1 tablet daily for 3 days then STOP *If you need a refill on your cardiac medications before your next appointment, please call your pharmacy*   Lab Work: TODAY -BMET & BNP 2 WEEKS BMET & MAG If you have labs (blood work) drawn today and your tests are completely normal, you will receive your results only by: [place] (if you have MyChart) OR A paper copy in the mail If you have any lab test that is abnormal or we need to change your treatment, we will call you to review the results.   Testing/Procedures: NONE ORDERED   Follow-Up: At [facility], you and your health needs are our priority.  As part of our continuing mission to provide you with exceptional heart care, we have created designated Provider Care Teams.  These Care Teams include your primary Cardiologist (physician) and Advanced Practice Providers (APPs -  Physician Assistants and Nurse Practitioners) who all work together to provide you with the care you need, when you need it.  We recommend signing up for the patient portal called "MyChart".  Sign up information is provided on this After Visit Summary.  MyChart is used to connect with patients for Virtual Visits (Telemedicine).  Patients are able to view lab/test results, encounter notes, upcoming appointments, etc.  Non-urgent messages can be sent to your provider as well.   To learn more about what you can do with MyChart, go to [email or web address].    Your next appointment:   4 month(s)  The format for your next appointment:   In Person  Provider:   Freada Bergeron, MD  Other Instructions You can go to [facility] or [employer] and putrchase a scale to check your weight daily  Important Information About Sugar

## 2022-11-20 LAB — BASIC METABOLIC PANEL
BUN/Creatinine Ratio: 18 (ref 10–24)
BUN: 21 mg/dL (ref 8–27)
CO2: 24 mmol/L (ref 20–29)
Calcium: 9.9 mg/dL (ref 8.6–10.2)
Chloride: 105 mmol/L (ref 96–106)
Creatinine, Ser: 1.16 mg/dL (ref 0.76–1.27)
Glucose: 86 mg/dL (ref 70–99)
Potassium: 5.4 mmol/L — ABNORMAL HIGH (ref 3.5–5.2)
Sodium: 142 mmol/L (ref 134–144)
eGFR: 67 mL/min/{1.73_m2} (ref >59)

## 2022-11-20 LAB — PRO B NATRIURETIC PEPTIDE: NT-Pro BNP: 444 pg/mL — ABNORMAL HIGH (ref 0–376)

## 2022-11-26 ENCOUNTER — Encounter (HOSPITAL_COMMUNITY): Payer: Self-pay | Admitting: *Deleted

## 2022-11-26 ENCOUNTER — Ambulatory Visit (HOSPITAL_COMMUNITY)
Admission: EM | Admit: 2022-11-26 | Discharge: 2022-11-26 | Disposition: A | Discharge status: Home / Self Care | Payer: Medicare PPO | Attending: Internal Medicine | Admitting: Internal Medicine

## 2022-11-26 DIAGNOSIS — K047 Periapical abscess without sinus: Secondary | ICD-10-CM | POA: Diagnosis not present

## 2022-11-26 DIAGNOSIS — K0889 Other specified disorders of teeth and supporting structures: Secondary | ICD-10-CM | POA: Diagnosis not present

## 2022-11-26 MED ORDER — AMOXICILLIN-POT CLAVULANATE 875-125 MG PO TABS: 1.0000 | ORAL_TABLET | Freq: Two times a day (BID) | ORAL | 0 refills | Status: DC

## 2022-11-26 NOTE — ED Provider Notes (Signed)
[place]    CSN: [id number] Arrival date & time: 11/26/22  0801      History   Chief Complaint Chief Complaint  Patient presents with   Dental Pain    HPI Juan Norris is a 71 y.o. male.   Patient presents urgent care for evaluation of left-sided facial swelling due to dental abscess that started last night.  Patient has had dental pain over the last few days but presents today due to significant swelling to the left upper aspect of the mouth over the last 24 hours.  He is experiencing pain to the left upper incisor and does not currently have a dentist.  He has [facility] and has struggled to find a Pharmacist, community who will accept his insurance over the last month.  He does not have a cell phone or computer, his daughter is helping him attempt to find dental care that accept his insurance.  He denies fever, chills, sore throat, shortness of breath, chest pain, body aches, URI symptoms, ear pain, and neck pain.  His voice is normal and he is able to eat normally despite tenderness to the mouth.  Multiple missing teeth at baseline.  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  He has been using Tylenol and ibuprofen over-the-counter to help with pain with some relief.  He has also been using ice to the cheek/facial area and attempt to reduce the swelling.   Dental Pain   Past Medical History:  Diagnosis Date   Acute renal insufficiency    Anginal pain (HCC)    Chronic lower back pain    "due to 3 ruptured discs in my back" (11/08/2018)   High cholesterol    History of kidney stones 1982   "passed them"   History of radiation therapy    Right lung- 11/03/21-11/14/21- Dr. Bubba Hales   Hypertension    Lung cancer [facility])    Myocardial infarction ([place]) 62/9528   Systolic heart failure ([place])    Tobacco use     Patient Active Problem List   Diagnosis Date Noted   COPD (chronic obstructive pulmonary  disease) ([place]) 05/28/2022   Malignant neoplasm of bronchus of right lower lobe ([place]) 10/17/2021   Left leg numbness 08/31/2021   CHF (congestive heart failure) ([place]) 08/31/2021   HTN (hypertension) 41/32/4401   Chronic systolic dysfunction of left ventricle 11/08/2018   Tobacco use 03/31/2018   Acute combined systolic and diastolic heart failure ([place]) 03/31/2018   Acute renal insufficiency 03/31/2018   Elevated troponin    NSTEMI (non-ST elevated myocardial infarction) ([place]) 03/29/2018   CAD (coronary artery disease) 12/14/2012   Chest pain 04/07/2012   History of heart attack 04/07/2012    Past Surgical History:  Procedure Laterality Date   BIV ICD INSERTION CRT-D N/A 11/08/2018    Medtronic biventricular ICD implantation by Dr Rayann Heman with His Bundle pacing and a MDT 0272 lead placed into the LV port.    BRONCHIAL BIOPSY  10/13/2021   Procedure: BRONCHIAL BIOPSIES;  Surgeon: Collene Gobble, MD;  Location: [facility] ENDOSCOPY;  Service: Pulmonary;;   BRONCHIAL BRUSHINGS  10/13/2021   Procedure: BRONCHIAL BRUSHINGS;  Surgeon: Collene Gobble, MD;  Location: [facility] ENDOSCOPY;  Service: Pulmonary;;   BRONCHIAL NEEDLE ASPIRATION BIOPSY  10/13/2021   Procedure: BRONCHIAL NEEDLE ASPIRATION BIOPSIES;  Surgeon: Collene Gobble, MD;  Location: [facility] ENDOSCOPY;  Service: Pulmonary;;   BRONCHIAL WASHINGS  10/13/2021   Procedure: BRONCHIAL WASHINGS;  Surgeon: Collene Gobble, MD;  Location: [facility] ENDOSCOPY;  Service: Pulmonary;;   CARDIAC CATHETERIZATION     FIDUCIAL MARKER PLACEMENT  10/13/2021   Procedure: FIDUCIAL MARKER PLACEMENT;  Surgeon: Collene Gobble, MD;  Location: [facility] ENDOSCOPY;  Service: Pulmonary;;   INGUINAL HERNIA REPAIR Bilateral    KNEE SURGERY Left 1990s   "growth on the back of my knee removed"   LEFT HEART CATH AND CORONARY ANGIOGRAPHY N/A 03/30/2018   Procedure: LEFT HEART CATH AND CORONARY ANGIOGRAPHY;  Surgeon: Burnell Blanks, MD;  Location: [facility];  Service:  Cardiovascular;  Laterality: N/A;   VIDEO BRONCHOSCOPY WITH ENDOBRONCHIAL NAVIGATION N/A 10/13/2021   Procedure: ROBOTIC VIDEO BRONCHOSCOPY WITH ENDOBRONCHIAL NAVIGATION;  Surgeon: Collene Gobble, MD;  Location: [facility] ENDOSCOPY;  Service: Pulmonary;  Laterality: N/A;   VIDEO BRONCHOSCOPY WITH RADIAL ENDOBRONCHIAL ULTRASOUND  10/13/2021   Procedure: RADIAL ENDOBRONCHIAL ULTRASOUND;  Surgeon: Collene Gobble, MD;  Location: [facility] ENDOSCOPY;  Service: Pulmonary;;       Home Medications    Prior to Admission medications   Medication Sig Start Date End Date Taking? Authorizing Provider  amoxicillin-clavulanate (AUGMENTIN) 875-125 MG tablet Take 1 tablet by mouth every 12 (twelve) hours. 11/26/22  Yes Talbot Grumbling, FNP  aspirin 81 MG EC tablet Take 1 tablet (81 mg total) by mouth daily. 11/09/18  Yes Baldwin Jamaica, PA-C  atorvastatin (LIPITOR) 40 MG tablet Take 1 tablet (40 mg total) by mouth at bedtime. 10/13/21  Yes Collene Gobble, MD  carvedilol (COREG) 25 MG tablet Take 12.5 mg by mouth daily.   Yes [provider]  furosemide (LASIX) 20 MG tablet Take 1 tablet (20 mg total) by mouth as directed. 11/19/22  Yes Marylu Lund., NP  Multiple Vitamin (MULTIVITAMIN WITH MINERALS) TABS tablet Take 1 tablet by mouth daily.   Yes [provider]  sacubitril-valsartan (ENTRESTO) 24-26 MG Take 1 tablet by mouth 2 (two) times daily. Please make overdue appt with Dr. Meda Coffee before anymore refills. 1st attempt 08/29/20  Yes Dorothy Spark, MD  spironolactone (ALDACTONE) 25 MG tablet Take 0.5 tablets (12.5 mg total) by mouth daily. 03/11/20  Yes Dorothy Spark, MD  nicotine (NICODERM CQ - DOSED IN MG/24 HOURS) 14 mg/24hr patch  02/19/22   [provider]  potassium chloride (KLOR-CON) 10 MEQ tablet Take 1 tablet (10 mEq total) by mouth as directed. Take when taking the Lasix 11/19/22 02/17/23  Marylu Lund., NP    Family History Family History  Problem  Relation Age of Onset   Leukemia Mother    Cancer Father    Cancer Sister    Cancer Brother    CAD Brother     Social History Social History   Tobacco Use   Smoking status: Every Day    Packs/day: 1.50    Years: 47.00    Total pack years: 70.50    Types: Cigarettes   Smokeless tobacco: Never   Tobacco comments:    1 cigarette smoked daily 05/28/22 [facility]   Vaping Use   Vaping Use: Never used  Substance Use Topics   Alcohol use: Not Currently    Comment: 11/08/2018 "stopped 09/23/1998"   Drug use: Not Currently    Comment: 11/08/2018 "nothing in the 2000s"     Allergies   Patient has no known allergies.   Review of Systems Review of Systems Per HPI  Physical Exam Triage Vital Signs  ED Triage Vitals  Enc Vitals Group     BP 11/26/22 0821 (!) 145/72     Pulse Rate 11/26/22 0821 70     Resp 11/26/22 0821 18     Temp 11/26/22 0821 (!) 97.4 F (36.3 C)     Temp Source 11/26/22 0821 Oral     SpO2 11/26/22 0821 96 %     Weight --      Height --      Head Circumference --      Peak Flow --      Pain Score 11/26/22 0817 10     Pain Loc --      Pain Edu? --      Excl. in [place]? --    No data found.  Updated Vital Signs BP (!) 145/72 (BP Location: Left Arm)   Pulse 70   Temp (!) 97.4 F (36.3 C) (Oral)   Resp 18   SpO2 96%   Visual Acuity Right Eye Distance:   Left Eye Distance:   Bilateral Distance:    Right Eye Near:   Left Eye Near:    Bilateral Near:     Physical Exam Vitals and nursing note reviewed.  Constitutional:      Appearance: He is not ill-appearing or toxic-appearing.  HENT:     Head: Normocephalic and atraumatic.     Jaw: There is normal jaw occlusion.      Comments: Swelling to the maxillary area indicated above, tender to palpation of this area as well.  Jaw occlusion is normal.    Right Ear: Hearing and external ear normal.     Left Ear: Hearing and external ear normal.     Nose: Nose normal.     Mouth/Throat:     Lips: Pink.      Mouth: Mucous membranes are moist.     Dentition: Abnormal dentition. Dental tenderness, dental caries and dental abscesses present.     Tongue: No lesions. Tongue does not deviate from midline.     Palate: No mass and lesions.     Pharynx: Oropharynx is clear. No posterior oropharyngeal erythema.     Tonsils: No tonsillar exudate or tonsillar abscesses.     Comments: Multiple missing teeth to the mouth.  Left upper incisor appears infected with purulent drainage at the gingival line. Eyes:     General: Lids are normal. Vision grossly intact. Gaze aligned appropriately.     Extraocular Movements: Extraocular movements intact.     Conjunctiva/sclera: Conjunctivae normal.  Neck:     Trachea: Trachea and phonation normal.  Cardiovascular:     Rate and Rhythm: Normal rate and regular rhythm.     Heart sounds: Normal heart sounds, S1 normal and S2 normal.  Pulmonary:     Effort: Pulmonary effort is normal. No respiratory distress.     Breath sounds: Normal breath sounds and air entry.  Musculoskeletal:     Cervical back: Neck supple.  Lymphadenopathy:     Cervical: Cervical adenopathy present.  Skin:    General: Skin is warm and dry.     Capillary Refill: Capillary refill takes less than 2 seconds.     Findings: No rash.  Neurological:     General: No focal deficit present.     Mental Status: He is alert and oriented to person, place, and time. Mental status is at baseline.     Cranial Nerves: No dysarthria or facial asymmetry.  Psychiatric:        Mood  and Affect: Mood normal.        Speech: Speech normal.        Behavior: Behavior normal.        Thought Content: Thought content normal.        Judgment: Judgment normal.      UC Treatments / Results  Labs (all labs ordered are listed, but only abnormal results are displayed) Labs Reviewed - No data to display  EKG   Radiology No results found.  Procedures Procedures (including critical care time)  Medications  Ordered in UC Medications - No data to display  Initial Impression / Assessment and Plan / UC Course  I have reviewed the triage vital signs and the nursing notes.  Pertinent labs & imaging results that were available during my care of the patient were reviewed by me and considered in my medical decision making (see chart for details).   1.  Dental pain and dental abscess Significant dental abscess to the left side of the upper mouth.  We will manage this with Augmentin and warm compresses 20 minutes on 20 minutes off intermittently over the next few days.  Patient given list of community dental resources.  He is to continue calling [facility] to attempt to establish care with a dentist.  He does not have transportation and therefore this limits his ability to be able to get to a dental appointment.  His daughter is currently helping him find an appointment and may be able to drive him to this appointment.  No airway involvement, vital signs are hemodynamically stable, oxygen is 96% on room air.  Strict ER return precautions discussed should the abscess grow significantly in size despite use of antibiotics over the next few days or should he suddenly developed severe or worsening pain to the maxillofacial area.   Discussed physical exam and available lab work findings in clinic with patient.  Counseled patient regarding appropriate use of medications and potential side effects for all medications recommended or prescribed today. Discussed red flag signs and symptoms of worsening condition,when to call the PCP office, return to urgent care, and when to seek higher level of care in the emergency department. Patient verbalizes understanding and agreement with plan. All questions answered. Patient discharged in stable condition.    Final Clinical Impressions(s) / UC Diagnoses   Final diagnoses:  Pain, dental  Dental abscess     Discharge Instructions      Take Augmentin twice daily for the next 7  days to treat your dental infection. Apply heat to your left side of your face 10-15 minutes on, 20 minutes off intermittently over the next few days to help with infection. Take tylenol 1,000mg  every 6 hours as needed for pain.  Schedule an appointment with one of the dentist on the list provided to urgent care today.  If you develop any new or worsening symptoms or do not improve in the next 2 to 3 days, please return.  If your symptoms are severe, please go to the emergency room.  Follow-up with your primary care provider for further evaluation and management of your symptoms as well as ongoing wellness visits.  I hope you feel better!  Dr. Duard Larsen, DDS Address: [address], [place], [place] [address] Hours:  Open ? Closes 4?PM Phone: [phone]    ED Prescriptions     Medication Sig Dispense Auth. Provider   amoxicillin-clavulanate (AUGMENTIN) 875-125 MG tablet Take 1 tablet by mouth every 12 (twelve) hours.  14 tablet Talbot Grumbling, FNP      PDMP not reviewed this encounter.   Talbot Grumbling, [place] 11/26/22 [phone]

## 2022-11-26 NOTE — Discharge Instructions (Addendum)
Take Augmentin twice daily for the next 7 days to treat your dental infection. Apply heat to your left side of your face 10-15 minutes on, 20 minutes off intermittently over the next few days to help with infection. Take tylenol 1,000mg  every 6 hours as needed for pain.  Schedule an appointment with one of the dentist on the list provided to urgent care today.  If you develop any new or worsening symptoms or do not improve in the next 2 to 3 days, please return.  If your symptoms are severe, please go to the emergency room.  Follow-up with your primary care provider for further evaluation and management of your symptoms as well as ongoing wellness visits.  I hope you feel better!  Dr. Duard Larsen, DDS Address: [address], [place],  [address] Hours:  Open ? Closes 4?PM Phone: [phone]

## 2022-11-26 NOTE — ED Triage Notes (Signed)
Pt states overnight the left side of his face started to swell and be painful he states he has a dental abscess on upper left side. He states he has been taking advil, he has [employer] but can't find a dentist to take his insurance.

## 2022-12-02 ENCOUNTER — Ambulatory Visit (INDEPENDENT_AMBULATORY_CARE_PROVIDER_SITE_OTHER): Payer: Medicare PPO

## 2022-12-02 DIAGNOSIS — Z95810 Presence of automatic (implantable) cardiac defibrillator: Secondary | ICD-10-CM

## 2022-12-02 DIAGNOSIS — I5042 Chronic combined systolic (congestive) and diastolic (congestive) heart failure: Secondary | ICD-10-CM

## 2022-12-03 NOTE — Progress Notes (Signed)
EPIC Encounter for ICM Monitoring  Patient Name: Juan Norris is a 71 y.o. male Date: 12/03/2022 Primary Care Physican: [facility] Primary Cardiologist: Johney Frame Electrophysiologist: Curt Bears Bi-V Pacing:   99.7%       12/03/2022 Weight: 150 lbs                                                             Spoke with patient and heart failure questions reviewed.  Transmission results reviewed.  Pt asymptomatic for fluid accumulation.    Reviewed fluid symptoms and when to take PRN Furosemide.   Diet:  Pt cooks at home and limits salt intake.    Optivol Thoracic impedance continues suggesting fluid levels returned normal after being prescribed and taking PRN Furosemide for a couple of days.   Prescribed:  Furosemide 20 mg take 1 tablet by mouth as directed Spironolactone 25 mg take 0.5 tablet (12.5 mg total) daily   Labs: 11/19/2022 Creatinine 1.16, BUN 18, Potassium 5.4, Sodium 142, GFR 67 A complete set of results can be found in Results Review.   Recommendations:   Recommendation to limit salt intake to 2000 mg daily and fluid intake to 64 oz daily.  Encouraged to call if experiencing any fluid symptoms.    Follow-up plan: ICM clinic phone appointment on 01/04/2023.   91 day device clinic remote transmission 02/10/2023.      EP/Cardiology Office Visits: Recall 03/11/2022 with Oda Kilts, PA or Tommye Standard, [place].  Recall 02/07/2022 with Dr Johney Frame.   Copy of ICM check sent to Dr. Curt Bears  3 month ICM trend: 12/02/2022.    12-14 Month ICM trend:     Rosalene Billings, RN 12/03/2022 10:26 AM

## 2022-12-08 NOTE — Progress Notes (Signed)
Remote ICD transmission.   

## 2023-01-04 ENCOUNTER — Ambulatory Visit: Payer: Medicare PPO | Attending: Cardiology

## 2023-01-04 DIAGNOSIS — I5042 Chronic combined systolic (congestive) and diastolic (congestive) heart failure: Secondary | ICD-10-CM

## 2023-01-04 DIAGNOSIS — Z95810 Presence of automatic (implantable) cardiac defibrillator: Secondary | ICD-10-CM

## 2023-01-05 ENCOUNTER — Encounter (HOSPITAL_COMMUNITY): Payer: Self-pay

## 2023-01-05 ENCOUNTER — Ambulatory Visit (HOSPITAL_COMMUNITY)
Admission: EM | Admit: 2023-01-05 | Discharge: 2023-01-05 | Disposition: A | Discharge status: Home / Self Care | Payer: No Typology Code available for payment source | Attending: Emergency Medicine | Admitting: Emergency Medicine

## 2023-01-05 DIAGNOSIS — K047 Periapical abscess without sinus: Secondary | ICD-10-CM | POA: Diagnosis not present

## 2023-01-05 MED ORDER — AMOXICILLIN-POT CLAVULANATE 875-125 MG PO TABS: 1.0000 | ORAL_TABLET | Freq: Two times a day (BID) | ORAL | 0 refills | Status: DC

## 2023-01-05 NOTE — Discharge Instructions (Signed)
Augmentin has been sent to the pharmacy, you will take this 2 times a day for the next 10 days.  Please make sure to finish all of the antibiotics even if symptoms improve before completion.  Please avoid taking this medication on an empty stomach.    As discussed, it is really important that you follow-up with a dentist.

## 2023-01-05 NOTE — ED Triage Notes (Signed)
Pt c/o lt upper toothache with facial swelling since 4am. States this is a recurrent event and unable to get them extracted. Denies taking any meds for pain.

## 2023-01-05 NOTE — ED Provider Notes (Signed)
[facility]    CSN: [id number] Arrival date & time: 01/05/23  0801      History   Chief Complaint Chief Complaint  Patient presents with   Dental Pain    HPI Juan Norris is a 72 y.o. male.  Patient presents complaining of left-sided upper dental pain and left-sided facial swelling that started this morning at 0400.  Patient reports that he has had dental infection that has been ongoing, he will have episodes where it will flareup.  He reports that he was previously seen at this clinic on 11/26/2022 for dental abscess, he stated he was prescribed antibiotics which helped him and he was able to finish the whole prescription.   He reports that he's had difficulty establishing care with a dentist due to the cost.  He reports that he was given low-cost dental resources during his last visit but he reports that there cost will still cause a financial strain.  He reports that [facility] but [facility] are unable to help him with any removal of his teeth.   He denies any fever, chills, sore throat, lymphadenopathy, throat pain, difficulty swallowing, or any systemic symptoms.  Dental Pain   Past Medical History:  Diagnosis Date   Acute renal insufficiency    Anginal pain (HCC)    Chronic lower back pain    "due to 3 ruptured discs in my back" (11/08/2018)   High cholesterol    History of kidney stones 1982   "passed them"   History of radiation therapy    Right lung- 11/03/21-11/14/21- Dr. 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) 10/17/2021   Left leg numbness 08/31/2021   CHF (congestive heart failure) (HCC) 08/31/2021   HTN (hypertension) 08/31/2021   Chronic  systolic dysfunction of left ventricle 11/08/2018   Tobacco use 03/31/2018   Acute combined systolic and diastolic heart failure (HCC) 03/31/2018   Acute renal insufficiency 03/31/2018   Elevated troponin    NSTEMI (non-ST elevated myocardial infarction) (HCC) 03/29/2018   CAD (coronary artery disease) 12/14/2012   Chest pain 04/07/2012   History of heart attack 04/07/2012    Past Surgical History:  Procedure Laterality Date   BIV ICD INSERTION CRT-D N/A 11/08/2018    Medtronic biventricular ICD implantation by Dr Johney Frame with His Bundle pacing and a MDT [id number] lead placed into the LV port.    BRONCHIAL BIOPSY  10/13/2021   Procedure: BRONCHIAL BIOPSIES;  Surgeon: Leslye Peer, MD;  Location: [facility] ENDOSCOPY;  Service: Pulmonary;;   BRONCHIAL BRUSHINGS  10/13/2021   Procedure: BRONCHIAL BRUSHINGS;  Surgeon: Leslye Peer, MD;  Location: [facility] ENDOSCOPY;  Service: Pulmonary;;   BRONCHIAL NEEDLE ASPIRATION BIOPSY  10/13/2021   Procedure: BRONCHIAL NEEDLE ASPIRATION BIOPSIES;  Surgeon: Leslye Peer, MD;  Location: [facility] ENDOSCOPY;  Service: Pulmonary;;   BRONCHIAL WASHINGS  10/13/2021   Procedure: BRONCHIAL WASHINGS;  Surgeon: Leslye Peer, MD;  [facility] ENDOS[facility];  Service: Pulmonary;;   CARDIAC CATHETERIZATION     FIDUCIAL MARKER PLACEMENT  10/13/2021   Procedure: FIDUCIAL MARKER PLACEMENT;  Surgeon: Leslye Peer, MD;  [facility] ENDOS[facility];  Service: Pulmonary;;   INGUINAL HERNIA REPAIR Bilateral    KNEE SURGERY Left 1990s   "growth on the back of my knee removed"   LEFT HEART CATH AND CORONARY ANGIOGRAPHY N/A 03/30/2018   Procedure: LEFT HEART CATH AND CORONARY ANGIOGRAPHY;  Surgeon: Kathleene Hazel, MD;  [facility];  Service: Cardiovascular;  Laterality: N/A;   VIDEO BRONCHOSCOPY WITH ENDOBRONCHIAL NAVIGATION N/A 10/13/2021   Procedure: ROBOTIC VIDEO BRONCHOSCOPY WITH ENDOBRONCHIAL NAVIGATION;  Surgeon: Leslye Peer, MD;  [facility] ENDOSCOPY;   Service: Pulmonary;  Laterality: N/A;   VIDEO BRONCHOSCOPY WITH RADIAL ENDOBRONCHIAL ULTRASOUND  10/13/2021   Procedure: RADIAL ENDOBRONCHIAL ULTRASOUND;  Surgeon: Leslye Peer, MD;  Location[facility] ENDOSCOPY;  Service: Pulmonary;;       Home Medications    Prior to Admission medications   Medication Sig Start Date End Date Taking? Authorizing Provider  amoxicillin-clavulanate (AUGMENTIN) 875-125 MG tablet Take 1 tablet by mouth every 12 (twelve) hours. 01/05/23  Yes Debby Freiberg, NP  aspirin 81 MG EC tablet Take 1 tablet (81 mg total) by mouth daily. 11/09/18   Sheilah Pigeon, PA-C  atorvastatin (LIPITOR) 40 MG tablet Take 1 tablet (40 mg total) by mouth at bedtime. 10/13/21   Leslye Peer, MD  carvedilol (COREG) 25 MG tablet Take 12.5 mg by mouth daily.    [provider]  furosemide (LASIX) 20 MG tablet Take 1 tablet (20 mg total) by mouth as directed. 11/19/22   Gaston Islam., NP  Multiple Vitamin (MULTIVITAMIN WITH MINERALS) TABS tablet Take 1 tablet by mouth daily.    [provider]  potassium chloride (KLOR-CON) 10 MEQ tablet Take 1 tablet (10 mEq total) by mouth as directed. Take when taking the Lasix 11/19/22 02/17/23  Gaston Islam., NP  sacubitril-valsartan (ENTRESTO) 24-26 MG Take 1 tablet by mouth 2 (two) times daily. Please make overdue appt with Dr. Delton See before anymore refills. 1st attempt 08/29/20   Lars Masson, MD  spironolactone (ALDACTONE) 25 MG tablet Take 0.5 tablets (12.5 mg total) by mouth daily. 03/11/20   Lars Masson, MD    Family History Family History  Problem Relation Age of Onset   Leukemia Mother    Cancer Father    Cancer Sister    Cancer Brother    CAD Brother     Social History Social History   Tobacco Use   Smoking status: Every Day    Packs/day: 1.50    Years: 47.00    Total pack years: 70.50    Types: Cigarettes   Smokeless tobacco: Never   Tobacco comments:    1 cigarette smoked daily  05/28/22 ARJ   Vaping Use   Vaping Use: Never used  Substance Use Topics   Alcohol use: Not Currently    Comment: 11/08/2018 "stopped 09/23/1998"   Drug use: Not Currently    Comment: 11/08/2018 "nothing in the 2000s"     Allergies   Patient has no known allergies.   Review of Systems Review of Systems Per HPI  Physical Exam Triage Vital Signs ED Triage Vitals [01/05/23 0821]  Enc Vitals Group     BP 119/73     Pulse Rate 87     Resp 18     Temp 97.7 F (36.5 C)     Temp Source Oral     SpO2 95 %     Weight      Height      Head  Circumference      Peak Flow      Pain Score 8     Pain Loc      Pain Edu?      Excl. in [place]?    No data found.  Updated Vital Signs BP 119/73 (BP Location: Left Arm)   Pulse 87   Temp 97.7 F (36.5 C) (Oral)   Resp 18   SpO2 95%     Physical Exam Vitals and nursing note reviewed.  Constitutional:      Appearance: Normal appearance.  HENT:     Head:      Comments: LFT sided facial swelling.     Mouth/Throat:     Mouth: Mucous membranes are moist.     Dentition: Abnormal dentition. Has dentures. Dental tenderness, gingival swelling, dental caries and dental abscesses present.     Pharynx: Oropharynx is clear. Uvula midline. No pharyngeal swelling, oropharyngeal exudate, posterior oropharyngeal erythema or uvula swelling.     Tonsils: No tonsillar exudate or tonsillar abscesses. 0 on the right. 0 on the left.      Comments: Multiple areas of dental caries and plaque buildup noted upon exam.  Dental abscess noted in the left upper row of teeth along gum line.  Neurological:     Mental Status: He is alert.      UC Treatments / Results  Labs (all labs ordered are listed, but only abnormal results are displayed) Labs Reviewed - No data to display  EKG   Radiology No results found.  Procedures Procedures (including critical care time)  Medications Ordered in UC Medications - No data to display  Initial Impression  / Assessment and Plan / UC Course  I have reviewed the triage vital signs and the nursing notes.  Pertinent labs & imaging results that were available during my care of the patient were reviewed by me and considered in my medical decision making (see chart for details).     Patient was evaluated for dental abscess and infection.  Augmentin antibiotic prescription was provided, shared decision-making was used to determine providing a 10-day course was reasonable based on severity.  Patient was educated on long-term effects of dental infection and he was made aware that it is important for him to follow-up with a dentist.  He stated that he would [facility], although they will not be able to remove any teeth.  He was offered low-cost dental resources, he stated that he received a list during his previous visit and will attempt to follow-up with them if he is financially able.  Patient verbalized understanding of instructions.   Charting was provided using a a verbal dictation system, charting was proofread for errors, errors may occur which could change the meaning of the information charted.   Final Clinical Impressions(s) / UC Diagnoses   Final diagnoses:  Dental abscess  Dental infection     Discharge Instructions      Augmentin has been sent to the pharmacy, you will take this 2 times a day for the next 10 days.  Please make sure to finish all of the antibiotics even if symptoms improve before completion.  Please avoid taking this medication on an empty stomach.    As discussed, it is really important that you follow-up with a dentist.      ED Prescriptions     Medication Sig Dispense Auth. Provider   amoxicillin-clavulanate (AUGMENTIN) 875-125 MG tablet Take 1 tablet by mouth  every 12 (twelve) hours. 20 tablet Debby Freiberg, NP      PDMP not reviewed this encounter.   Debby Freiberg, NP 01/05/23 1137

## 2023-01-11 NOTE — Progress Notes (Signed)
EPIC Encounter for ICM Monitoring  Patient Name: Juan Norris is a 72 y.o. male Date: 01/11/2023 Primary Care Physican: [facility] Primary Cardiologist: Johney Frame Electrophysiologist: Curt Bears Bi-V Pacing:   99.6%       12/03/2022 Weight: 150 lbs                                                             Transmission reviewed.   Diet:  Pt cooks at home and limits salt intake.    Optivol Thoracic impedance continues suggesting normal fluid levels.   Prescribed:  Furosemide 20 mg take 1 tablet by mouth as directed Spironolactone 25 mg take 0.5 tablet (12.5 mg total) daily   Labs: 11/19/2022 Creatinine 1.16, BUN 18, Potassium 5.4, Sodium 142, GFR 67 A complete set of results can be found in Results Review.   Recommendations:  No changes.   Follow-up plan: ICM clinic phone appointment on 02/08/2023.   91 day device clinic remote transmission 02/10/2023.      EP/Cardiology Office Visits: Recall 03/11/2022 with Oda Kilts, PA or Tommye Standard, [place].  Recall 02/07/2022 with Dr Johney Frame.   Copy of ICM check sent to Dr. Curt Bears  3 month ICM trend: 01/04/2023.    12-14 Month ICM trend:     Rosalene Billings, RN 01/11/2023 11:55 AM

## 2023-02-08 ENCOUNTER — Ambulatory Visit: Payer: Medicare PPO | Attending: Cardiology

## 2023-02-08 DIAGNOSIS — Z95810 Presence of automatic (implantable) cardiac defibrillator: Secondary | ICD-10-CM

## 2023-02-08 DIAGNOSIS — I5042 Chronic combined systolic (congestive) and diastolic (congestive) heart failure: Secondary | ICD-10-CM

## 2023-02-09 ENCOUNTER — Telehealth: Payer: Self-pay

## 2023-02-09 NOTE — Telephone Encounter (Signed)
Remote ICM transmission received.  Attempted call to patient regarding ICM remote transmission and left detailed message per DPR to return call.  Advised to return call for any fluid symptoms or questions. Next ICM remote transmission scheduled 02/22/2023.

## 2023-02-09 NOTE — Progress Notes (Signed)
EPIC Encounter for ICM Monitoring  Patient Name: Juan Norris is a 72 y.o. male Date: 02/09/2023 Primary Care Physican: [facility] Primary Cardiologist: Johney Frame Electrophysiologist: Curt Bears Bi-V Pacing:   99.6%       12/03/2022 Weight: 150 lbs                                                             Attempted call to patient and unable to reach.  Left detailed message per DPR regarding transmission. Transmission reviewed.    Diet:  Pt cooks at home and limits salt intake.    Optivol Thoracic impedance continues suggesting possible fluid accumulation starting 2/17.   Prescribed:  Furosemide 20 mg take 1 tablet by mouth as directed Spironolactone 25 mg take 0.5 tablet (12.5 mg total) daily   Labs: 11/19/2022 Creatinine 1.16, BUN 18, Potassium 5.4, Sodium 142, GFR 67 A complete set of results can be found in Results Review.   Recommendations:  Left voice mail with ICM number and encouraged to call if experiencing any fluid symptoms.  Left message to call the office to schedule overdue appts with Dr Curt Bears and Dr Johney Frame.   Follow-up plan: ICM clinic phone appointment on 02/22/2023 to recheck fluid levels.   91 day device clinic remote transmission 02/10/2023.      EP/Cardiology Office Visits: Recall 03/11/2022 with Oda Kilts, PA or Tommye Standard, [place].  Recall 02/07/2022 with Dr Johney Frame.   Copy of ICM check sent to Dr. Curt Bears.  Will send to Dr Johney Frame for review if patient is reached.    3 month ICM trend: 02/08/2023.    12-14 Month ICM trend:     Rosalene Billings, RN 02/09/2023 1:44 PM

## 2023-02-10 ENCOUNTER — Ambulatory Visit (INDEPENDENT_AMBULATORY_CARE_PROVIDER_SITE_OTHER): Payer: Medicare PPO

## 2023-02-10 DIAGNOSIS — I428 Other cardiomyopathies: Secondary | ICD-10-CM | POA: Diagnosis not present

## 2023-02-12 ENCOUNTER — Telehealth: Payer: Self-pay

## 2023-02-12 MED ORDER — FUROSEMIDE 20 MG PO TABS: 20.0000 mg | ORAL_TABLET | Freq: Every day | ORAL | 11 refills | Status: DC

## 2023-02-12 NOTE — Telephone Encounter (Signed)
Spoke with patient for ICM fluid accumulation.  Advised patient to take PRN Furosemide and he reports he is currently of Furosemide.  Advised will send request for refill to refill department.  He is requesting prescription be sent to [facility] on [address].

## 2023-02-12 NOTE — Progress Notes (Signed)
Spoke with patient and heart failure questions reviewed.  Transmission results reviewed.  Pt asymptomatic for fluid accumulation.  Reports feeling well at this time and voices no complaints.  He said he has been eating foods that are high in salt.  He needs a Furosemide refill sent to [facility] on [address].  Provided office number and advised to call to schedule appointments with Dr Johney Frame and Dr Curt Bears.  Advised to take PRN Furosemide 1 tablet x 2 days once he receives the prescription.   Refill request sent to [facility] street refill department.

## 2023-02-12 NOTE — Addendum Note (Signed)
Addended by: Carter Kitten D on: 02/12/2023 10:40 AM   Modules accepted: Orders

## 2023-02-12 NOTE — Telephone Encounter (Signed)
Pt's medication was sent to pt's pharmacy as requested. Confirmation received.  °

## 2023-02-16 ENCOUNTER — Ambulatory Visit (HOSPITAL_COMMUNITY)
Admission: EM | Admit: 2023-02-16 | Discharge: 2023-02-16 | Disposition: A | Discharge status: Home / Self Care | Payer: No Typology Code available for payment source | Attending: Family Medicine | Admitting: Family Medicine

## 2023-02-16 ENCOUNTER — Other Ambulatory Visit: Payer: Self-pay

## 2023-02-16 ENCOUNTER — Encounter (HOSPITAL_COMMUNITY): Payer: Self-pay | Admitting: *Deleted

## 2023-02-16 DIAGNOSIS — K047 Periapical abscess without sinus: Secondary | ICD-10-CM

## 2023-02-16 MED ORDER — AMOXICILLIN-POT CLAVULANATE 875-125 MG PO TABS: 1.0000 | ORAL_TABLET | Freq: Two times a day (BID) | ORAL | 0 refills | Status: AC

## 2023-02-16 NOTE — ED Triage Notes (Signed)
Pt reports on going dental pain. Tooth upper rt.

## 2023-02-16 NOTE — ED Provider Notes (Signed)
[facility]    CSN: [id number] Arrival date & time: 02/16/23  [id number]      History   Chief Complaint Chief Complaint  Patient presents with   Dental Pain    HPI Juan Norris is a 72 y.o. male.    Dental Pain  Here for left lower dental pain and swelling in his left cheek.  The swelling began this morning early, and the pain worsened today.  He has had trouble for several months with his dental decay and pain and swelling.  We have previously provided him a list of low-cost dental providers, and he has also tried to go through the [place] or through his [facility].  Usually the out-of-pocket costs are so much that he cannot go for dental procedures that he needs.  Currently he does not have any fever or chills.    Past Medical History:  Diagnosis Date   Acute renal insufficiency    Anginal pain (HCC)    Chronic lower back pain    "due to 3 ruptured discs in my back" (11/08/2018)   High cholesterol    History of kidney stones 1982   "passed them"   History of radiation therapy    Right lung- 11/03/21-11/14/21- Dr. Bubba Hales   Hypertension    Lung cancer [facility])    Myocardial infarction ([place]) 99991111   Systolic heart failure ([place])    Tobacco use     Patient Active Problem List   Diagnosis Date Noted   COPD (chronic obstructive pulmonary disease) ([place]) 05/28/2022   Malignant neoplasm of bronchus of right lower lobe ([place]) 10/17/2021   Left leg numbness 08/31/2021   CHF (congestive heart failure) ([place]) 08/31/2021   HTN (hypertension) [id number]   Chronic systolic dysfunction of left ventricle 11/08/2018   Tobacco use 03/31/2018   Acute combined systolic and diastolic heart failure ([place]) 03/31/2018   Acute renal insufficiency 03/31/2018   Elevated troponin    NSTEMI (non-ST elevated myocardial infarction) ([place]) 03/29/2018   CAD (coronary artery disease) 12/14/2012   Chest pain 04/07/2012   History of heart attack 04/07/2012    Past Surgical History:  Procedure  Laterality Date   BIV ICD INSERTION CRT-D N/A 11/08/2018    Medtronic biventricular ICD implantation by Dr Rayann Heman with His Bundle pacing and a MDT [id number] lead placed into the LV port.    BRONCHIAL BIOPSY  10/13/2021   Procedure: BRONCHIAL BIOPSIES;  Surgeon: Collene Gobble, MD;  Location: [facility] ENDOSCOPY;  Service: Pulmonary;;   BRONCHIAL BRUSHINGS  10/13/2021   Procedure: BRONCHIAL BRUSHINGS;  Surgeon: Collene Gobble, MD;  Location: [facility] ENDOSCOPY;  Service: Pulmonary;;   BRONCHIAL NEEDLE ASPIRATION BIOPSY  10/13/2021   Procedure: BRONCHIAL NEEDLE ASPIRATION BIOPSIES;  Surgeon: Collene Gobble, MD;  Location: [place];  Service: Pulmonary;;   BRONCHIAL WASHINGS  10/13/2021   Procedure: BRONCHIAL WASHINGS;  Surgeon: Collene Gobble, MD;  Location: [place];  Service: Pulmonary;;   CARDIAC CATHETERIZATION     FIDUCIAL MARKER PLACEMENT  10/13/2021   Procedure: FIDUCIAL MARKER PLACEMENT;  Surgeon: Collene Gobble, MD;  [facility] ENDOSCOPY;  Service: Pulmonary;;   INGUINAL HERNIA REPAIR Bilateral    KNEE SURGERY Left 1990s   "growth on the back of my knee removed"   LEFT HEART CATH AND CORONARY ANGIOGRAPHY N/A 03/30/2018   Procedure: LEFT HEART CATH AND CORONARY ANGIOGRAPHY;  Surgeon: Burnell Blanks, MD;  Location: [place] CV LAB;  Service: Cardiovascular;  Laterality: N/A;   VIDEO  BRONCHOSCOPY WITH ENDOBRONCHIAL NAVIGATION N/A 10/13/2021   Procedure: ROBOTIC VIDEO BRONCHOSCOPY WITH ENDOBRONCHIAL NAVIGATION;  Surgeon: Collene Gobble, MD;  Location: [place] ENDOSCOPY;  Service: Pulmonary;  Laterality: N/A;   VIDEO BRONCHOSCOPY WITH RADIAL ENDOBRONCHIAL ULTRASOUND  10/13/2021   Procedure: RADIAL ENDOBRONCHIAL ULTRASOUND;  Surgeon: Collene Gobble, MD;  Location: [place] ENDOSCOPY;  Service: Pulmonary;;       Home Medications    Prior to Admission medications   Medication Sig Start Date End Date Taking? Authorizing Provider  amoxicillin-clavulanate (AUGMENTIN) 875-125 MG tablet Take  1 tablet by mouth 2 (two) times daily for 7 days. 02/16/23 02/23/23 Yes Barrett Henle, MD  aspirin 81 MG EC tablet Take 1 tablet (81 mg total) by mouth daily. 11/09/18   Baldwin Jamaica, PA-C  atorvastatin (LIPITOR) 40 MG tablet Take 1 tablet (40 mg total) by mouth at bedtime. 10/13/21   Collene Gobble, MD  carvedilol (COREG) 25 MG tablet Take 12.5 mg by mouth daily.    [provider]  furosemide (LASIX) 20 MG tablet Take 1 tablet (20 mg total) by mouth daily. As directed 02/12/23   Marylu Lund., NP  Multiple Vitamin (MULTIVITAMIN WITH MINERALS) TABS tablet Take 1 tablet by mouth daily.    [provider]  potassium chloride (KLOR-CON) 10 MEQ tablet Take 1 tablet (10 mEq total) by mouth as directed. Take when taking the Lasix 11/19/22 02/17/23  Marylu Lund., NP  sacubitril-valsartan (ENTRESTO) 24-26 MG Take 1 tablet by mouth 2 (two) times daily. Please make overdue appt with Dr. Meda Coffee before anymore refills. 1st attempt 08/29/20   Dorothy Spark, MD  spironolactone (ALDACTONE) 25 MG tablet Take 0.5 tablets (12.5 mg total) by mouth daily. 03/11/20   Dorothy Spark, MD    Family History Family History  Problem Relation Age of Onset   Leukemia Mother    Cancer Father    Cancer Sister    Cancer Brother    CAD Brother     Social History Social History   Tobacco Use   Smoking status: Every Day    Packs/day: 1.50    Years: 47.00    Total pack years: 70.50    Types: Cigarettes   Smokeless tobacco: Never   Tobacco comments:    1 cigarette smoked daily 05/28/22 ARJ   Vaping Use   Vaping Use: Never used  Substance Use Topics   Alcohol use: Not Currently    Comment: 11/08/2018 "stopped 09/23/1998"   Drug use: Not Currently    Comment: 11/08/2018 "nothing in the 2000s"     Allergies   Patient has no known allergies.   Review of Systems Review of Systems   Physical Exam Triage Vital Signs ED Triage Vitals  Enc Vitals Group     BP 02/16/23  1012 114/61     Pulse Rate 02/16/23 1012 62     Resp 02/16/23 1012 18     Temp 02/16/23 1012 97.9 F (36.6 C)     Temp src --      SpO2 02/16/23 1012 92 %     Weight --      Height --      Head Circumference --      Peak Flow --      Pain Score 02/16/23 1010 6     Pain Loc --      Pain Edu? --      Excl. in [place]? --    No data found.  Updated Vital Signs BP 114/61   Pulse 62   Temp 97.9 F (36.6 C)   Resp 18   SpO2 92%   Visual Acuity Right Eye Distance:   Left Eye Distance:   Bilateral Distance:    Right Eye Near:   Left Eye Near:    Bilateral Near:     Physical Exam Vitals reviewed.  Constitutional:      General: He is not in acute distress.    Appearance: He is not ill-appearing, toxic-appearing or diaphoretic.  HENT:     Head:     Comments: There is swelling externally on the left cheek.  No fluctuance.    Right Ear: Tympanic membrane normal.     Left Ear: Tympanic membrane normal.     Nose: Nose normal.     Mouth/Throat:     Mouth: Mucous membranes are moist.     Pharynx: No oropharyngeal exudate or posterior oropharyngeal erythema.     Comments: There are broken carious teeth on the left lower dental ridge.  No swelling that is consistent with an abscess is seen Eyes:     Extraocular Movements: Extraocular movements intact.     Conjunctiva/sclera: Conjunctivae normal.     Pupils: Pupils are equal, round, and reactive to light.  Cardiovascular:     Rate and Rhythm: Normal rate and regular rhythm.     Heart sounds: No murmur heard. Pulmonary:     Effort: No respiratory distress.     Breath sounds: No stridor. No wheezing, rhonchi or rales.  Musculoskeletal:     Cervical back: Neck supple.  Lymphadenopathy:     Cervical: No cervical adenopathy.  Skin:    Coloration: Skin is not jaundiced or pale.  Neurological:     General: No focal deficit present.     Mental Status: He is alert and oriented to person, place, and time.  Psychiatric:         Behavior: Behavior normal.      UC Treatments / Results  Labs (all labs ordered are listed, but only abnormal results are displayed) Labs Reviewed - No data to display  EKG   Radiology No results found.  Procedures Procedures (including critical care time)  Medications Ordered in UC Medications - No data to display  Initial Impression / Assessment and Plan / UC Course  I have reviewed the triage vital signs and the nursing notes.  Pertinent labs & imaging results that were available during my care of the patient were reviewed by me and considered in my medical decision making (see chart for details).        Augmentin is sent in again to treat the oral infection and cellulitis.  We discussed trying to get in with a dentist, but finances are making it difficult for him.  He declined my offer of a toradol injection, and states he will stick with tylenol Final Clinical Impressions(s) / UC Diagnoses   Final diagnoses:  Dental infection     Discharge Instructions      Take amoxicillin-clavulanate 875 mg--1 tab twice daily with food for 7 days       ED Prescriptions     Medication Sig Dispense Auth. Provider   amoxicillin-clavulanate (AUGMENTIN) 875-125 MG tablet Take 1 tablet by mouth 2 (two) times daily for 7 days. 14 tablet Aviv Lengacher, Gwenlyn Perking, MD      I have reviewed the PDMP during this encounter.   Barrett Henle, MD 02/16/23 1027

## 2023-02-16 NOTE — Discharge Instructions (Signed)
Take amoxicillin-clavulanate 875 mg--1 tab twice daily with food for 7 days

## 2023-02-19 LAB — CUP PACEART REMOTE DEVICE CHECK
Battery Remaining Longevity: 22 mo
Battery Voltage: 2.92 V
Brady Statistic AP VP Percent: 42.2 %
Brady Statistic AP VS Percent: 0.01 %
Brady Statistic AS VP Percent: 57.76 %
Brady Statistic AS VS Percent: 0.03 %
Brady Statistic RA Percent Paced: 42.08 %
Brady Statistic RV Percent Paced: 99.72 %
Date Time Interrogation Session: 20240307195935
HighPow Impedance: 54 Ohm
Implantable Lead Connection Status: 753985
Implantable Lead Connection Status: 753985
Implantable Lead Connection Status: 753985
Implantable Lead Implant Date: 20191126
Implantable Lead Implant Date: 20191126
Implantable Lead Implant Date: 20191126
Implantable Lead Location: 753859
Implantable Lead Location: 753860
Implantable Lead Location: 753860
Implantable Lead Model: 3830
Implantable Lead Model: 5076
Implantable Pulse Generator Implant Date: 20191126
Lead Channel Impedance Value: 228 Ohm
Lead Channel Impedance Value: 266 Ohm
Lead Channel Impedance Value: 266 Ohm
Lead Channel Impedance Value: 304 Ohm
Lead Channel Impedance Value: 418 Ohm
Lead Channel Impedance Value: 437 Ohm
Lead Channel Pacing Threshold Amplitude: 0.5 V
Lead Channel Pacing Threshold Amplitude: 0.625 V
Lead Channel Pacing Threshold Amplitude: 0.625 V
Lead Channel Pacing Threshold Pulse Width: 0.4 ms
Lead Channel Pacing Threshold Pulse Width: 0.4 ms
Lead Channel Pacing Threshold Pulse Width: 0.4 ms
Lead Channel Sensing Intrinsic Amplitude: 2.125 mV
Lead Channel Sensing Intrinsic Amplitude: 2.125 mV
Lead Channel Sensing Intrinsic Amplitude: 6.75 mV
Lead Channel Sensing Intrinsic Amplitude: 6.75 mV
Lead Channel Setting Pacing Amplitude: 1.5 V
Lead Channel Setting Pacing Amplitude: 2.5 V
Lead Channel Setting Pacing Amplitude: 2.5 V
Lead Channel Setting Pacing Pulse Width: 0.4 ms
Lead Channel Setting Pacing Pulse Width: 0.4 ms
Lead Channel Setting Sensing Sensitivity: 0.3 mV
Zone Setting Status: 755011
Zone Setting Status: 755011

## 2023-02-22 ENCOUNTER — Ambulatory Visit: Payer: Medicare PPO | Attending: Cardiology

## 2023-02-22 DIAGNOSIS — Z95810 Presence of automatic (implantable) cardiac defibrillator: Secondary | ICD-10-CM

## 2023-02-22 DIAGNOSIS — I5042 Chronic combined systolic (congestive) and diastolic (congestive) heart failure: Secondary | ICD-10-CM

## 2023-02-22 NOTE — Progress Notes (Signed)
EPIC Encounter for ICM Monitoring  Patient Name: Juan Norris is a 72 y.o. male Date: 02/22/2023 Primary Care Physican: [facility] Primary Cardiologist: Johney Frame Electrophysiologist: Curt Bears Bi-V Pacing:   99.6%       12/03/2022 Weight: 150 lbs  02/22/2023 Weight: 151 lbs                                                            Spoke with patient and heart failure questions reviewed.  Transmission results reviewed.  Pt reports swelling of feet in the last couple of days and took PRN Furosemide.     Diet:  Pt cooks at home and limits salt intake.    Optivol Thoracic impedance continues suggesting possible fluid accumulation starting 2/17 but trending toward baseline.   Prescribed:  Furosemide 20 mg take 1 tablet by mouth as directed.  He takes PRN. Spironolactone 25 mg take 0.5 tablet (12.5 mg total) daily   Labs: 11/19/2022 Creatinine 1.16, BUN 18, Potassium 5.4, Sodium 142, GFR 67 A complete set of results can be found in Results Review.   Recommendations: Advised to take PRN Furosemide 1 tablet x 3 days and then return to PRN.     Follow-up plan: ICM clinic phone appointment on 03/01/2023 to recheck fluid levels.   91 day device clinic remote transmission 05/12/2023.      EP/Cardiology Office Visits:   Advised to call office to schedule appointments.  Recall 03/11/2022 with Oda Kilts, PA or Tommye Standard, [place].  Recall 02/07/2022 with Dr Johney Frame.   Copy of ICM check sent to Dr. Curt Bears and Dr Johney Frame as Juluis Rainier.   3 month ICM trend: 02/22/2023.    12-14 Month ICM trend:     Rosalene Billings, RN 02/22/2023 2:36 PM

## 2023-03-01 ENCOUNTER — Ambulatory Visit: Payer: Medicare PPO | Attending: Cardiology

## 2023-03-01 DIAGNOSIS — I5042 Chronic combined systolic (congestive) and diastolic (congestive) heart failure: Secondary | ICD-10-CM

## 2023-03-01 DIAGNOSIS — Z95810 Presence of automatic (implantable) cardiac defibrillator: Secondary | ICD-10-CM

## 2023-03-05 NOTE — Progress Notes (Addendum)
EPIC Encounter for ICM Monitoring  Patient Name: Juan Norris is a 73 y.o. male Date: 03/05/2023 Primary Care Physican: [facility] Primary Cardiologist: Johney Frame Electrophysiologist: Curt Bears Bi-V Pacing:   99.7%       12/03/2022 Weight: 150 lbs  02/22/2023 Weight: 151 lbs                                                            Spoke with patient and heart failure questions reviewed.  Transmission results reviewed.  Pt reports he is doing well.  He reports having trouble paying for food.  Offered to have SW call him to help with food resources but he declined.  He has ICM number and encouraged to call back if he would like assistance.   Diet:  Pt cooks at home and limits salt intake.    Optivol Thoracic impedance continues suggesting fluid levels returned to normal.   Prescribed:  Furosemide 20 mg take 1 tablet by mouth as directed.  He takes PRN. Spironolactone 25 mg take 0.5 tablet (12.5 mg total) daily   Labs: 11/19/2022 Creatinine 1.16, BUN 18, Potassium 5.4, Sodium 142, GFR 67 A complete set of results can be found in Results Review.   Recommendations:   No changes and encouraged to call if experiencing any fluid symptoms.   Follow-up plan: ICM clinic phone appointment on 03/29/2023.   91 day device clinic remote transmission 05/12/2023.      EP/Cardiology Office Visits:   Advised to call office to schedule appointments.  Recall 03/11/2022 with Oda Kilts, PA or Tommye Standard, [place].  Recall 02/07/2022 with Dr Johney Frame.   Copy of ICM check sent to Dr. Curt Bears.   3 month ICM trend: 03/01/2023.    12-14 Month ICM trend:     Rosalene Billings, RN 03/05/2023 3:59 PM

## 2023-03-17 NOTE — Progress Notes (Signed)
Remote ICD transmission.   

## 2023-03-29 ENCOUNTER — Ambulatory Visit: Payer: Medicare PPO | Attending: Cardiology

## 2023-03-29 DIAGNOSIS — Z95810 Presence of automatic (implantable) cardiac defibrillator: Secondary | ICD-10-CM | POA: Diagnosis not present

## 2023-03-29 DIAGNOSIS — I5042 Chronic combined systolic (congestive) and diastolic (congestive) heart failure: Secondary | ICD-10-CM | POA: Diagnosis not present

## 2023-03-30 NOTE — Progress Notes (Signed)
EPIC Encounter for ICM Monitoring  Patient Name: Juan Norris is a 72 y.o. male Date: 03/30/2023 Primary Care Physican: [facility] Primary Cardiologist: Shari Prows Electrophysiologist: Elberta Fortis Bi-V Pacing:   98.4%       12/03/2022 Weight: 150 lbs  02/22/2023 Weight: 151 lbs                                                            Spoke with patient and heart failure questions reviewed.  Transmission results reviewed.  Pt reports he is doing well.     Diet:  Pt cooks at home and limits salt intake.    Optivol Thoracic impedance continues suggesting normal fluid levels.   Prescribed:  Furosemide 20 mg take 1 tablet by mouth as directed.  He takes PRN. Spironolactone 25 mg take 0.5 tablet (12.5 mg total) daily   Labs: 11/19/2022 Creatinine 1.16, BUN 18, Potassium 5.4, Sodium 142, GFR 67 A complete set of results can be found in Results Review.   Recommendations:   No changes and encouraged to call if experiencing any fluid symptoms.   Follow-up plan: ICM clinic phone appointment on 05/03/2023.   91 day device clinic remote transmission 05/12/2023.      EP/Cardiology Office Visits:   Advised to call office to schedule appointments.  Recall 03/11/2022 with Otilio Saber, PA or Francis Dowse, [place].  Recall 02/07/2022 with Dr Shari Prows.   Copy of ICM check sent to Dr. Elberta Fortis.   3 month ICM trend: 03/29/2023.    12-14 Month ICM trend:     Karie Soda, RN 03/30/2023 3:02 PM

## 2023-04-21 ENCOUNTER — Encounter (HOSPITAL_COMMUNITY): Payer: Self-pay | Admitting: *Deleted

## 2023-04-21 ENCOUNTER — Other Ambulatory Visit: Payer: Self-pay

## 2023-04-21 ENCOUNTER — Ambulatory Visit (HOSPITAL_COMMUNITY)
Admission: EM | Admit: 2023-04-21 | Discharge: 2023-04-21 | Disposition: A | Discharge status: Home / Self Care | Payer: No Typology Code available for payment source | Attending: Physician Assistant | Admitting: Physician Assistant

## 2023-04-21 DIAGNOSIS — K047 Periapical abscess without sinus: Secondary | ICD-10-CM

## 2023-04-21 MED ORDER — CHLORHEXIDINE GLUCONATE 0.12 % MT SOLN: 5.0000 mL | Freq: Two times a day (BID) | OROMUCOSAL | 0 refills | Status: AC

## 2023-04-21 MED ORDER — AMOXICILLIN-POT CLAVULANATE 875-125 MG PO TABS: 1.0000 | ORAL_TABLET | Freq: Two times a day (BID) | ORAL | 0 refills | Status: DC

## 2023-04-21 NOTE — Discharge Instructions (Signed)
You have a dental infection.  Please start Augmentin twice daily for 10 days.  Take this with food as it can upset your stomach.  Gargle with warm salt water.  I also recommend starting chlorhexidine solution twice daily to decrease the amount of bacteria in your mouth.  It is very importantly follow-up with dentist as scheduled on the 17th.  If you have any swelling of your throat, shortness of breath, muffled voice, fever, trouble swallowing you need to be seen immediately.

## 2023-04-21 NOTE — ED Provider Notes (Signed)
[facility]    CSN: [id number] Arrival date & time: 04/21/23  0820      History   Chief Complaint Chief Complaint  Patient presents with   Oral Swelling    HPI Juan Norris is a 72 y.o. male.   Patient presents today with a several day history of worsening recurrent left upper jaw.  He has a history of recurrent dental infections and has been seen by clinic multiple times in the past few months for treatment.  He was last treated 02/16/2023 with Augmentin.  Reports that he generally gets this medication and it resolves of symptoms within a few days.  He knows that he needs to have a tooth extracted but has been unable to afford this.  [facility] will allow him to get this done through them even though he does not have 100% disability.  He has an appointment with them on 04/30/2023.  He denies additional antibiotics in the past 90 days.  Denies any swelling of his throat, shortness of breath, muffled voice, dysphagia.  Pain is rated 6 on a 0-10 pain scale, described as throbbing, no aggravating or alleviating factors identified.  He has tried warm compresses without improvement of symptoms.    Past Medical History:  Diagnosis Date   Acute renal insufficiency    Anginal pain (HCC)    Chronic lower back pain    "due to 3 ruptured discs in my back" (11/08/2018)   High cholesterol    History of kidney stones 1982   "passed them"   History of radiation therapy    Right lung- 11/03/21-11/14/21- Dr. Joylene Draft   Hypertension    Lung cancer [facility])    Myocardial infarction (HCC) 03/2018   Systolic heart failure (HCC)    Tobacco use     Patient Active Problem List   Diagnosis Date Noted   COPD (chronic obstructive pulmonary disease) (HCC) 05/28/2022   Malignant neoplasm of bronchus of right lower lobe (HCC) 10/17/2021   Left leg numbness 08/31/2021   CHF (congestive heart failure) (HCC) 08/31/2021   HTN (hypertension) 08/31/2021    Chronic systolic dysfunction of left ventricle 11/08/2018   Tobacco use 03/31/2018   Acute combined systolic and diastolic heart failure (HCC) 03/31/2018   Acute renal insufficiency 03/31/2018   Elevated troponin    NSTEMI (non-ST elevated myocardial infarction) (HCC) 03/29/2018   CAD (coronary artery disease) 12/14/2012   Chest pain 04/07/2012   History of heart attack 04/07/2012    Past Surgical History:  Procedure Laterality Date   BIV ICD INSERTION CRT-D N/A 11/08/2018    Medtronic biventricular ICD implantation by Dr Johney Frame with His Bundle pacing and a MDT 3830 lead placed into the LV port.    BRONCHIAL BIOPSY  10/13/2021   Procedure: BRONCHIAL BIOPSIES;  Surgeon: Leslye Peer, MD;  Location: [facility] ENDOSCOPY;  Service: Pulmonary;;   BRONCHIAL BRUSHINGS  10/13/2021   Procedure: BRONCHIAL BRUSHINGS;  Surgeon: Leslye Peer, MD;  Location: [facility] ENDOSCOPY;  Service: Pulmonary;;   BRONCHIAL NEEDLE ASPIRATION BIOPSY  10/13/2021   Procedure: BRONCHIAL NEEDLE ASPIRATION BIOPSIES;  Surgeon: Leslye Peer, MD;  Location: [facility] ENDOSCOPY;  Service: Pulmonary;;   BRONCHIAL WASHINGS  10/13/2021   Procedure: BRONCHIAL WASHINGS;  Surgeon: Leslye Peer, MD;  Location: [facility] ENDOSCOPY;  Service: Pulmonary;;   CARDIAC CATHETERIZATION     FIDUCIAL MARKER PLACEMENT  10/13/2021   Procedure: FIDUCIAL MARKER PLACEMENT;  Surgeon: Levy Pupa  S, MD;  Location: [facility] ENDOSCOPY;  Service: Pulmonary;;   INGUINAL HERNIA REPAIR Bilateral    KNEE SURGERY Left 1990s   "growth on the back of my knee removed"   LEFT HEART CATH AND CORONARY ANGIOGRAPHY N/A 03/30/2018   Procedure: LEFT HEART CATH AND CORONARY ANGIOGRAPHY;  Surgeon: Kathleene Hazel, MD;  Location: [facility] INVASIVE CV LAB;  Service: Cardiovascular;  Laterality: N/A;   VIDEO BRONCHOSCOPY WITH ENDOBRONCHIAL NAVIGATION N/A 10/13/2021   Procedure: ROBOTIC VIDEO BRONCHOSCOPY WITH ENDOBRONCHIAL NAVIGATION;  Surgeon: Leslye Peer, MD;  Location: [facility]  ENDOSCOPY;  Service: Pulmonary;  Laterality: N/A;   VIDEO BRONCHOSCOPY WITH RADIAL ENDOBRONCHIAL ULTRASOUND  10/13/2021   Procedure: RADIAL ENDOBRONCHIAL ULTRASOUND;  Surgeon: Leslye Peer, MD;  Location: [facility] ENDOSCOPY;  Service: Pulmonary;;       Home Medications    Prior to Admission medications   Medication Sig Start Date End Date Taking? Authorizing Provider  amoxicillin-clavulanate (AUGMENTIN) 875-125 MG tablet Take 1 tablet by mouth every 12 (twelve) hours. 04/21/23  Yes Baileigh Modisette K, PA-C  chlorhexidine (PERIDEX) 0.12 % solution Use as directed 5 mLs in the mouth or throat 2 (two) times daily. 04/21/23 05/21/23 Yes Kenyana Husak, Noberto Retort, PA-C  aspirin 81 MG EC tablet Take 1 tablet (81 mg total) by mouth daily. 11/09/18   Sheilah Pigeon, PA-C  atorvastatin (LIPITOR) 40 MG tablet Take 1 tablet (40 mg total) by mouth at bedtime. 10/13/21   Leslye Peer, MD  carvedilol (COREG) 25 MG tablet Take 12.5 mg by mouth daily.    [provider]  furosemide (LASIX) 20 MG tablet Take 1 tablet (20 mg total) by mouth daily. As directed 02/12/23   Gaston Islam., NP  Multiple Vitamin (MULTIVITAMIN WITH MINERALS) TABS tablet Take 1 tablet by mouth daily.    [provider]  potassium chloride (KLOR-CON) 10 MEQ tablet Take 1 tablet (10 mEq total) by mouth as directed. Take when taking the Lasix 11/19/22 02/17/23  Gaston Islam., NP  sacubitril-valsartan (ENTRESTO) 24-26 MG Take 1 tablet by mouth 2 (two) times daily. Please make overdue appt with Dr. Delton See before anymore refills. 1st attempt 08/29/20   Lars Masson, MD  spironolactone (ALDACTONE) 25 MG tablet Take 0.5 tablets (12.5 mg total) by mouth daily. 03/11/20   Lars Masson, MD    Family History Family History  Problem Relation Age of Onset   Leukemia Mother    Cancer Father    Cancer Sister    Cancer Brother    CAD Brother     Social History Social History   Tobacco Use   Smoking status: Every Day     Packs/day: 1.50    Years: 47.00    Additional pack years: 0.00    Total pack years: 70.50    Types: Cigarettes   Smokeless tobacco: Never   Tobacco comments:    1 cigarette smoked daily 05/28/22 [facility]   Vaping Use   Vaping Use: Never used  Substance Use Topics   Alcohol use: Not Currently    Comment: 11/08/2018 "stopped 09/23/1998"   Drug use: Not Currently    Comment: 11/08/2018 "nothing in the 2000s"     Allergies   Patient has no known allergies.   Review of Systems Review of Systems  Constitutional:  Negative for activity change, appetite change, fatigue and fever.  HENT:  Positive for dental problem and facial swelling. Negative for congestion, sinus pressure, sneezing, sore throat, trouble swallowing and voice  change.   Respiratory:  Negative for cough and shortness of breath.   Cardiovascular:  Negative for chest pain.  Gastrointestinal:  Negative for abdominal pain, diarrhea, nausea and vomiting.     Physical Exam Triage Vital Signs ED Triage Vitals  Enc Vitals Group     BP 04/21/23 0907 (!) 114/55     Pulse Rate 04/21/23 0907 64     Resp 04/21/23 0907 18     Temp 04/21/23 0907 98.5 F (36.9 C)     Temp src --      SpO2 04/21/23 0907 96 %     Weight --      Height --      Head Circumference --      Peak Flow --      Pain Score 04/21/23 0906 6     Pain Loc --      Pain Edu? --      Excl. in GC? --    No data found.  Updated Vital Signs BP (!) 114/55   Pulse 64   Temp 98.5 F (36.9 C)   Resp 18   SpO2 96%   Visual Acuity Right Eye Distance:   Left Eye Distance:   Bilateral Distance:    Right Eye Near:   Left Eye Near:    Bilateral Near:     Physical Exam Vitals reviewed.  Constitutional:      General: He is awake.     Appearance: Normal appearance. He is well-developed. He is not ill-appearing.     Comments: Very pleasant male appears stated age in no acute distress sitting comfortably in exam room  HENT:     Head: Normocephalic and  atraumatic.      Right Ear: External ear normal.     Left Ear: External ear normal.     Nose: Nose normal.     Mouth/Throat:     Dentition: Abnormal dentition. Gingival swelling and dental abscesses present.     Pharynx: Uvula midline. Posterior oropharyngeal erythema present. No oropharyngeal exudate.      Comments: Multiple missing teeth with surrounding gingival swelling and abscess surrounding left upper premolar.  No evidence of Ludwig angina. Cardiovascular:     Rate and Rhythm: Normal rate and regular rhythm.     Heart sounds: Normal heart sounds, S1 normal and S2 normal. No murmur heard. Pulmonary:     Effort: Pulmonary effort is normal. No accessory muscle usage or respiratory distress.     Breath sounds: Normal breath sounds. No stridor. No wheezing, rhonchi or rales.     Comments: Clear auscultation bilaterally Abdominal:     General: Bowel sounds are normal.     Palpations: Abdomen is soft.     Tenderness: There is no abdominal tenderness.  Lymphadenopathy:     Head:     Left side of head: No submental adenopathy.  Neurological:     Mental Status: He is alert.  Psychiatric:        Behavior: Behavior is cooperative.      UC Treatments / Results  Labs (all labs ordered are listed, but only abnormal results are displayed) Labs Reviewed - No data to display  EKG   Radiology No results found.  Procedures Procedures (including critical care time)  Medications Ordered in UC Medications - No data to display  Initial Impression / Assessment and Plan / UC Course  I have reviewed the triage vital signs and the nursing notes.  Pertinent labs & imaging results that  were available during my care of the patient were reviewed by me and considered in my medical decision making (see chart for details).     Patient is well-appearing, afebrile, nontoxic, nontachycardic.  No indication for emergent evaluation or imaging.  Patient was treated for dental infection with  Augmentin twice daily for 10 days.  Will start chlorhexidine mouthwash to see if this will decrease bacterial load and decrease frequency of recurrent infections.  Can use Tylenol for pain.  Recommend he gargle with warm salt water for additional symptom relief.  Discussed that ultimately he will need to see dentist to address underlying tooth; [facility], strongly encouraged to keep this appointment.  If he develops any worsening symptoms including fever, nausea, vomiting, swelling of his throat, muffled voice, dysphagia he needs to go to the emergency room to which he expressed understanding.    Final Clinical Impressions(s) / UC Diagnoses   Final diagnoses:  Dental infection     Discharge Instructions      You have a dental infection.  Please start Augmentin twice daily for 10 days.  Take this with food as it can upset your stomach.  Gargle with warm salt water.  I also recommend starting chlorhexidine solution twice daily to decrease the amount of bacteria in your mouth.  It is very importantly follow-up with dentist as scheduled on the 17th.  If you have any swelling of your throat, shortness of breath, muffled voice, fever, trouble swallowing you need to be seen immediately.     ED Prescriptions     Medication Sig Dispense Auth. Provider   amoxicillin-clavulanate (AUGMENTIN) 875-125 MG tablet Take 1 tablet by mouth every 12 (twelve) hours. 20 tablet Randa Riss K, PA-C   chlorhexidine (PERIDEX) 0.12 % solution Use as directed 5 mLs in the mouth or throat 2 (two) times daily. 300 mL Syvanna Ciolino K, PA-C      PDMP not reviewed this encounter.   Jeani Hawking, PA-C 04/21/23 1610

## 2023-04-21 NOTE — ED Triage Notes (Signed)
Pt reports tooth abscess that started  last nigh t. [facility].

## 2023-05-03 ENCOUNTER — Ambulatory Visit: Payer: Medicare PPO | Attending: Cardiology

## 2023-05-03 DIAGNOSIS — I5042 Chronic combined systolic (congestive) and diastolic (congestive) heart failure: Secondary | ICD-10-CM | POA: Diagnosis not present

## 2023-05-03 DIAGNOSIS — Z95810 Presence of automatic (implantable) cardiac defibrillator: Secondary | ICD-10-CM | POA: Diagnosis not present

## 2023-05-05 NOTE — Progress Notes (Signed)
EPIC Encounter for ICM Monitoring  Patient Name: Juan Norris is a 72 y.o. male Date: 05/05/2023 Primary Care Physican: [facility] Primary Cardiologist: Shari Prows Electrophysiologist: Elberta Fortis Bi-V Pacing:   99.5%       12/03/2022 Weight: 150 lbs  02/22/2023 Weight: 151 lbs                                                            Spoke with patient and heart failure questions reviewed.  Transmission results reviewed.  Pt reports he is doing well.     Diet:  Pt cooks at home and limits salt intake.    Optivol Thoracic impedance continues suggesting intermittent days with possible fluid accumulation.   Prescribed:  Furosemide 20 mg take 1 tablet by mouth daily as directed (11/19/2022 OV note states take PRN) Potassium 10 mEq take 1 tablet by mouth as directed when taking Lasix Spironolactone 25 mg take 0.5 tablet (12.5 mg total) daily   Labs: 11/19/2022 Creatinine 1.16, BUN 18, Potassium 5.4, Sodium 142, GFR 67 A complete set of results can be found in Results Review.   Recommendations:   No changes and encouraged to call if experiencing any fluid symptoms.   Follow-up plan: ICM clinic phone appointment on 06/07/2023.   91 day device clinic remote transmission 05/12/2023.      EP/Cardiology Office Visits:   Advised to call office to schedule appointments.  Recall 03/11/2022 with Otilio Saber, PA or Francis Dowse, [place].  Due to make 4 month appt with Dr Shari Prows in April 2024 (no recall).   Copy of ICM check sent to Dr. Elberta Fortis.   3 month ICM trend: 05/03/2023.    12-14 Month ICM trend:     Karie Soda, RN 05/05/2023 2:15 PM

## 2023-05-12 ENCOUNTER — Ambulatory Visit (INDEPENDENT_AMBULATORY_CARE_PROVIDER_SITE_OTHER): Payer: Medicare PPO

## 2023-05-12 DIAGNOSIS — I519 Heart disease, unspecified: Secondary | ICD-10-CM

## 2023-05-13 LAB — CUP PACEART REMOTE DEVICE CHECK
Battery Remaining Longevity: 19 mo
Battery Voltage: 2.91 V
Brady Statistic AP VP Percent: 39.6 %
Brady Statistic AP VS Percent: 0.02 %
Brady Statistic AS VP Percent: 60.37 %
Brady Statistic AS VS Percent: 0.01 %
Brady Statistic RA Percent Paced: 39.4 %
Brady Statistic RV Percent Paced: 99.56 %
Date Time Interrogation Session: 20240529052725
HighPow Impedance: 59 Ohm
Implantable Lead Connection Status: 753985
Implantable Lead Connection Status: 753985
Implantable Lead Connection Status: 753985
Implantable Lead Implant Date: 20191126
Implantable Lead Implant Date: 20191126
Implantable Lead Implant Date: 20191126
Implantable Lead Location: 753859
Implantable Lead Location: 753860
Implantable Lead Location: 753860
Implantable Lead Model: 3830
Implantable Lead Model: 5076
Implantable Pulse Generator Implant Date: 20191126
Lead Channel Impedance Value: 228 Ohm
Lead Channel Impedance Value: 266 Ohm
Lead Channel Impedance Value: 266 Ohm
Lead Channel Impedance Value: 304 Ohm
Lead Channel Impedance Value: 399 Ohm
Lead Channel Impedance Value: 437 Ohm
Lead Channel Pacing Threshold Amplitude: 0.375 V
Lead Channel Pacing Threshold Amplitude: 0.5 V
Lead Channel Pacing Threshold Amplitude: 0.625 V
Lead Channel Pacing Threshold Pulse Width: 0.4 ms
Lead Channel Pacing Threshold Pulse Width: 0.4 ms
Lead Channel Pacing Threshold Pulse Width: 0.4 ms
Lead Channel Sensing Intrinsic Amplitude: 2.25 mV
Lead Channel Sensing Intrinsic Amplitude: 2.25 mV
Lead Channel Sensing Intrinsic Amplitude: 8.625 mV
Lead Channel Sensing Intrinsic Amplitude: 8.625 mV
Lead Channel Setting Pacing Amplitude: 1.5 V
Lead Channel Setting Pacing Amplitude: 2.5 V
Lead Channel Setting Pacing Amplitude: 2.5 V
Lead Channel Setting Pacing Pulse Width: 0.4 ms
Lead Channel Setting Pacing Pulse Width: 0.4 ms
Lead Channel Setting Sensing Sensitivity: 0.3 mV
Zone Setting Status: 755011
Zone Setting Status: 755011

## 2023-06-03 NOTE — Progress Notes (Signed)
Remote ICD transmission.   

## 2023-06-04 ENCOUNTER — Ambulatory Visit: Payer: Medicare PPO | Attending: Cardiovascular Disease | Admitting: Cardiovascular Disease

## 2023-06-04 ENCOUNTER — Encounter: Payer: Self-pay | Admitting: Cardiovascular Disease

## 2023-06-04 VITALS — BP 124/54 | HR 60 | Ht 65.0 in | Wt 147.6 lb

## 2023-06-04 DIAGNOSIS — Z95810 Presence of automatic (implantable) cardiac defibrillator: Secondary | ICD-10-CM | POA: Diagnosis not present

## 2023-06-04 DIAGNOSIS — I519 Heart disease, unspecified: Secondary | ICD-10-CM | POA: Diagnosis not present

## 2023-06-04 DIAGNOSIS — Z4502 Encounter for adjustment and management of automatic implantable cardiac defibrillator: Secondary | ICD-10-CM | POA: Diagnosis not present

## 2023-06-04 NOTE — Patient Instructions (Signed)
Medication Instructions:  Your physician recommends that you continue on your current medications as directed. Please refer to the Current Medication list given to you today. *If you need a refill on your cardiac medications before your next appointment, please call your pharmacy*   Follow-Up: At [facility], you and your health needs are our priority.  As part of our continuing mission to provide you with exceptional heart care, we have created designated Provider Care Teams.  These Care Teams include your primary Cardiologist (physician) and Advanced Practice Providers (APPs -  Physician Assistants and Nurse Practitioners) who all work together to provide you with the care you need, when you need it.  We recommend signing up for the patient portal called "MyChart".  Sign up information is provided on this After Visit Summary.  MyChart is used to connect with patients for Virtual Visits (Telemedicine).  Patients are able to view lab/test results, encounter notes, upcoming appointments, etc.  Non-urgent messages can be sent to your provider as well.   To learn more about what you can do with MyChart, go to [email or web address].    Your next appointment:   1 year(s)  Provider:   Augustus Mealor, MD  

## 2023-06-04 NOTE — Progress Notes (Signed)
  Electrophysiology Office Note:    Date:  06/04/2023   ID:  KRISTOF NADEEM, DOB 1951-06-29, MRN [id number]  PCP:  [facility] Providers Cardiologist:  None Cardiology APP:  Dyann Kief, PA-C  Electrophysiologist:  Hillis Range, MD (Inactive)     Referring MD: [facility]   History of Present Illness:    Juan Norris is a 72 y.o. 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     He has a Medtronic BiV ICD implanted by Dr. Johney Frame in November 2019 for primary prevention of sudden cardiac death and treatment of left bundle branch block.     Today, he reports that he is doing well.  I reviewed notes from Robin Searing, Dr. Johney Frame.  EKGs/Labs/Other Studies Reviewed Today:    Echocardiogram:  TTE 09/23/2021 EF 25-30%   Monitors:   Stress testing:   Advanced imaging:   Cardiac catherization  03/2018 Reviewed in epic  EKG:         Physical Exam:    VS:  BP (!) 124/54   Pulse 60   Ht 5\' 5"  (1.651 m)   Wt 147 lb 9.6 oz (67 kg)   SpO2 96%   BMI 24.56 kg/m     Wt Readings from Last 3 Encounters:  06/04/23 147 lb 9.6 oz (67 kg)  11/19/22 150 lb (68 kg)  05/28/22 155 lb 3.2 oz (70.4 kg)     GEN:  Well nourished, well developed in no acute distress CARDIAC: RRR, no murmurs, rubs, gallops The device site is normal -- no tenderness, edema, drainage, redness, threatened erosion. RESPIRATORY:  Normal work of breathing MUSCULOSKELETAL: no edema    ASSESSMENT & PLAN:    Medtronic BiV-ICD Normal function. I reviewed the interrogation in detail today. See PaceArt 99% BiV Paced He is not device-dependent  CHFrEF Continue carvedilol 25, sacubitril/valsartan 24-26 mg, Aldactone 25 mg, furosemide 20 mg History of left bundle branch block  Nonobstructive coronary disease Continue  aspirin 81 mg  Hypertension BP controlled today  This is my first visit with Mr. Past.  I spent 35 minutes on this visit.   Signed, Maurice Small, MD  06/04/2023 9:13 AM    [facility]

## 2023-06-07 ENCOUNTER — Ambulatory Visit: Payer: Medicare PPO | Attending: Cardiology

## 2023-06-07 DIAGNOSIS — Z95810 Presence of automatic (implantable) cardiac defibrillator: Secondary | ICD-10-CM

## 2023-06-07 DIAGNOSIS — I5042 Chronic combined systolic (congestive) and diastolic (congestive) heart failure: Secondary | ICD-10-CM | POA: Diagnosis not present

## 2023-06-11 NOTE — Progress Notes (Signed)
EPIC Encounter for ICM Monitoring  Patient Name: Juan Norris is a 72 y.o. male Date: 06/11/2023 Primary Care Physican: [facility] Primary Cardiologist: Shari Prows Electrophysiologist: Mealor Bi-V Pacing:   99.6%       02/22/2023 Weight: 151 lbs 06/11/2023 Weight: 150-151 lbs                                                            Spoke with patient and heart failure questions reviewed.  Transmission results reviewed.  Pt asymptomatic for fluid accumulation.  Reports feeling well at this time and voices no complaints.     Diet:  Pt cooks at home and limits salt intake.    Optivol Thoracic impedance continues suggesting intermittent days with possible fluid accumulation within the last month.   Prescribed:  Furosemide 20 mg take 1 tablet by mouth daily as directed (11/19/2022 OV note states take PRN) Potassium 10 mEq take 1 tablet by mouth as directed when taking Lasix Spironolactone 25 mg take 0.5 tablet (12.5 mg total) daily   Labs: 11/19/2022 Creatinine 1.16, BUN 18, Potassium 5.4, Sodium 142, GFR 67 A complete set of results can be found in Results Review.   Recommendations:   No changes and encouraged to call if experiencing any fluid symptoms.   Follow-up plan: ICM clinic phone appointment on 07/12/2023.   91 day device clinic remote transmission 08/11/2023.      EP/Cardiology Office Visits:  Recall 05/29/2024 with Dr Nelly Laurence.  Pt aware to call Dr Devin Going office to schedule appt.  Due 03/2023 appt with Dr Shari Prows (4 month f/u).   Copy of ICM check sent to Dr. Nelly Laurence.   3 month ICM trend: 06/08/2023.    12-14 Month ICM trend:     Karie Soda, RN 06/11/2023 12:36 PM

## 2023-07-12 ENCOUNTER — Ambulatory Visit: Payer: Medicare PPO | Attending: Cardiovascular Disease

## 2023-07-12 DIAGNOSIS — I5042 Chronic combined systolic (congestive) and diastolic (congestive) heart failure: Secondary | ICD-10-CM

## 2023-07-12 DIAGNOSIS — Z95810 Presence of automatic (implantable) cardiac defibrillator: Secondary | ICD-10-CM

## 2023-07-14 NOTE — Progress Notes (Signed)
EPIC Encounter for ICM Monitoring  Patient Name: Juan Norris is a 72 y.o. male Date: 07/14/2023 Primary Care Physican: [facility] Primary Cardiologist: Shari Prows Electrophysiologist: Mealor Bi-V Pacing:   99.8%       02/22/2023 Weight: 151 lbs 06/11/2023 Weight: 150-151 lbs                                                            Transmission results reviewed.       Diet:  Pt cooks at home and limits salt intake.    Optivol Thoracic impedance continues suggesting intermittent days with possible fluid accumulation within the last month.   Prescribed:  Furosemide 20 mg take 1 tablet by mouth daily as directed (11/19/2022 OV note states take PRN) Potassium 10 mEq take 1 tablet by mouth as directed when taking Lasix Spironolactone 25 mg take 0.5 tablet (12.5 mg total) daily   Labs: 11/19/2022 Creatinine 1.16, BUN 18, Potassium 5.4, Sodium 142, GFR 67 A complete set of results can be found in Results Review.   Recommendations:   No changes.   Follow-up plan: ICM clinic phone appointment on 08/17/2023.   91 day device clinic remote transmission 08/11/2023.      EP/Cardiology Office Visits:  Recall 05/29/2024 with Dr Nelly Laurence.  Pt aware to call Dr Devin Going office to schedule appt.  Due 03/2023 appt with Dr Shari Prows (4 month f/u).   Copy of ICM check sent to Dr. Nelly Laurence.   3 month ICM trend: 07/12/2023.    12-14 Month ICM trend:     Karie Soda, RN 07/14/2023 4:06 PM

## 2023-08-11 ENCOUNTER — Ambulatory Visit (INDEPENDENT_AMBULATORY_CARE_PROVIDER_SITE_OTHER): Payer: Medicare PPO

## 2023-08-11 DIAGNOSIS — I428 Other cardiomyopathies: Secondary | ICD-10-CM | POA: Diagnosis not present

## 2023-08-11 LAB — CUP PACEART REMOTE DEVICE CHECK
Battery Remaining Longevity: 17 mo
Battery Voltage: 2.91 V
Brady Statistic AP VP Percent: 19.43 %
Brady Statistic AP VS Percent: 0.01 %
Brady Statistic AS VP Percent: 80.53 %
Brady Statistic AS VS Percent: 0.03 %
Brady Statistic RA Percent Paced: 19.41 %
Brady Statistic RV Percent Paced: 99.83 %
Date Time Interrogation Session: 20240828033323
HighPow Impedance: 56 Ohm
Implantable Lead Connection Status: 753985
Implantable Lead Connection Status: 753985
Implantable Lead Connection Status: 753985
Implantable Lead Implant Date: 20191126
Implantable Lead Implant Date: 20191126
Implantable Lead Implant Date: 20191126
Implantable Lead Location: 753859
Implantable Lead Location: 753860
Implantable Lead Location: 753860
Implantable Lead Model: 3830
Implantable Lead Model: 5076
Implantable Pulse Generator Implant Date: 20191126
Lead Channel Impedance Value: 228 Ohm
Lead Channel Impedance Value: 228 Ohm
Lead Channel Impedance Value: 285 Ohm
Lead Channel Impedance Value: 285 Ohm
Lead Channel Impedance Value: 418 Ohm
Lead Channel Impedance Value: 437 Ohm
Lead Channel Pacing Threshold Amplitude: 0.5 V
Lead Channel Pacing Threshold Amplitude: 0.5 V
Lead Channel Pacing Threshold Amplitude: 0.625 V
Lead Channel Pacing Threshold Pulse Width: 0.4 ms
Lead Channel Pacing Threshold Pulse Width: 0.4 ms
Lead Channel Pacing Threshold Pulse Width: 0.4 ms
Lead Channel Sensing Intrinsic Amplitude: 1.75 mV
Lead Channel Sensing Intrinsic Amplitude: 1.75 mV
Lead Channel Sensing Intrinsic Amplitude: 9.625 mV
Lead Channel Sensing Intrinsic Amplitude: 9.625 mV
Lead Channel Setting Pacing Amplitude: 1.5 V
Lead Channel Setting Pacing Amplitude: 2.5 V
Lead Channel Setting Pacing Amplitude: 2.5 V
Lead Channel Setting Pacing Pulse Width: 0.4 ms
Lead Channel Setting Pacing Pulse Width: 0.4 ms
Lead Channel Setting Sensing Sensitivity: 0.3 mV
Zone Setting Status: 755011
Zone Setting Status: 755011

## 2023-08-14 ENCOUNTER — Encounter (HOSPITAL_COMMUNITY): Payer: Self-pay | Admitting: Emergency Medicine

## 2023-08-14 ENCOUNTER — Ambulatory Visit (HOSPITAL_COMMUNITY)
Admission: EM | Admit: 2023-08-14 | Discharge: 2023-08-14 | Disposition: A | Discharge status: Home / Self Care | Payer: No Typology Code available for payment source | Attending: Nurse Practitioner | Admitting: Nurse Practitioner

## 2023-08-14 DIAGNOSIS — K047 Periapical abscess without sinus: Secondary | ICD-10-CM | POA: Diagnosis not present

## 2023-08-14 DIAGNOSIS — K0889 Other specified disorders of teeth and supporting structures: Secondary | ICD-10-CM

## 2023-08-14 MED ORDER — AMOXICILLIN-POT CLAVULANATE 875-125 MG PO TABS: 1.0000 | ORAL_TABLET | Freq: Two times a day (BID) | ORAL | 0 refills | Status: DC

## 2023-08-14 NOTE — ED Provider Notes (Signed)
[facility]    CSN: [id number] Arrival date & time: 08/14/23  1554      History   Chief Complaint Chief Complaint  Patient presents with   Dental Pain    HPI Juan Norris is a 72 y.o. male.   Patient presents today with 1 day history of left upper dental pain.  Reports history of abscesses in the same area.  [facility]  Denies fever or nausea/vomiting.  Reports his face feels swollen.    Past Medical History:  Diagnosis Date   Acute renal insufficiency    Anginal pain (HCC)    Chronic lower back pain    "due to 3 ruptured discs in my back" (11/08/2018)   High cholesterol    History of kidney stones 1982   "passed them"   History of radiation therapy    Right lung- 11/03/21-11/14/21- Dr. Joylene Draft   Hypertension    Lung cancer [facility])    Myocardial infarction (HCC) 03/2018   Systolic heart failure (HCC)    Tobacco use     Patient Active Problem List   Diagnosis Date Noted   COPD (chronic obstructive pulmonary disease) (HCC) 05/28/2022   Malignant neoplasm of bronchus of right lower lobe (HCC) 10/17/2021   Left leg numbness 08/31/2021   CHF (congestive heart failure) (HCC) 08/31/2021   HTN (hypertension) 08/31/2021   Chronic systolic dysfunction of left ventricle 11/08/2018   Tobacco use 03/31/2018   Acute combined systolic and diastolic heart failure (HCC) 03/31/2018   Acute renal insufficiency 03/31/2018   Elevated troponin    NSTEMI (non-ST elevated myocardial infarction) (HCC) 03/29/2018   CAD (coronary artery disease) 12/14/2012   Chest pain 04/07/2012   History of heart attack 04/07/2012    Past Surgical History:  Procedure Laterality Date   BIV ICD INSERTION CRT-D N/A 11/08/2018    Medtronic biventricular ICD implantation by Dr Johney Frame with His Bundle pacing and a MDT 3830 lead placed into the LV port.    BRONCHIAL BIOPSY  10/13/2021   Procedure: BRONCHIAL BIOPSIES;  Surgeon: Leslye Peer, MD;  Location: [facility] ENDOSCOPY;  Service: Pulmonary;;   BRONCHIAL BRUSHINGS  10/13/2021   Procedure: BRONCHIAL BRUSHINGS;  Surgeon: Leslye Peer, MD;  Location: [facility] ENDOSCOPY;  Service: Pulmonary;;   BRONCHIAL NEEDLE ASPIRATION BIOPSY  10/13/2021   Procedure: BRONCHIAL NEEDLE ASPIRATION BIOPSIES;  Surgeon: Leslye Peer, MD;  Location: [facility] ENDOSCOPY;  Service: Pulmonary;;   BRONCHIAL WASHINGS  10/13/2021   Procedure: BRONCHIAL WASHINGS;  Surgeon: Leslye Peer, MD;  [facility] ENDOS[facility];  Service: Pulmonary;;   CARDIAC CATHETERIZATION     FIDUCIAL MARKER PLACEMENT  10/13/2021   Procedure: FIDUCIAL MARKER PLACEMENT;  Surgeon: Leslye Peer, MD;  [facility]: [facility] ENDOSCOPY;  Service: Pulmonary;;   INGUINAL HERNIA REPAIR Bilateral    KNEE SURGERY Left 1990s   "growth on the back of my knee removed"   LEFT HEART CATH AND CORONARY ANGIOGRAPHY N/A 03/30/2018   Procedure: LEFT HEART CATH AND CORONARY ANGIOGRAPHY;  Surgeon: Kathleene Hazel, MD;  [facility];  Service: Cardiovascular;  Laterality: N/A;   VIDEO BRONCHOSCOPY WITH ENDOBRONCHIAL NAVIGATION N/A 10/13/2021   Procedure: ROBOTIC VIDEO BRONCHOSCOPY WITH ENDOBRONCHIAL NAVIGATION;  Surgeon: Leslye Peer, MD;  [facility] ENDOSCOPY;  Service: Pulmonary;  Laterality: N/A;   VIDEO BRONCHOSCOPY WITH RADIAL ENDOBRONCHIAL ULTRASOUND  10/13/2021   Procedure: RADIAL ENDOBRONCHIAL ULTRASOUND;  Surgeon: Leslye Peer, MD;  [facility] ENDOSCOPY;  Service: Pulmonary;;       Home Medications    Prior to Admission medications   Medication Sig Start Date End Date Taking? Authorizing Provider  amoxicillin-clavulanate (AUGMENTIN) 875-125 MG tablet Take 1 tablet by mouth every 12 (twelve) hours. 08/14/23   Valentino Nose, NP  aspirin 81 MG EC tablet Take 1 tablet (81 mg total) by mouth daily. 11/09/18   Sheilah Pigeon, PA-C  atorvastatin (LIPITOR) 40 MG tablet Take 1 tablet (40 mg total) by mouth at  bedtime. 10/13/21   Leslye Peer, MD  carvedilol (COREG) 25 MG tablet Take 12.5 mg by mouth daily.    [provider]  furosemide (LASIX) 20 MG tablet Take 1 tablet (20 mg total) by mouth daily. As directed 02/12/23   Gaston Islam., NP  Multiple Vitamin (MULTIVITAMIN WITH MINERALS) TABS tablet Take 1 tablet by mouth daily.    [provider]  potassium chloride (KLOR-CON) 10 MEQ tablet Take 1 tablet (10 mEq total) by mouth as directed. Take when taking the Lasix 11/19/22 02/17/23  Gaston Islam., NP  sacubitril-valsartan (ENTRESTO) 24-26 MG Take 1 tablet by mouth 2 (two) times daily. Please make overdue appt with Dr. Delton See before anymore refills. 1st attempt 08/29/20   Lars Masson, MD  spironolactone (ALDACTONE) 25 MG tablet Take 0.5 tablets (12.5 mg total) by mouth daily. 03/11/20   Lars Masson, MD    Family History Family History  Problem Relation Age of Onset   Leukemia Mother    Cancer Father    Cancer Sister    Cancer Brother    CAD Brother     Social History Social History   Tobacco Use   Smoking status: Every Day    Current packs/day: 1.50    Average packs/day: 1.5 packs/day for 47.0 years (70.5 ttl pk-yrs)    Types: Cigarettes   Smokeless tobacco: Never   Tobacco comments:    1 cigarette smoked daily 05/28/22 ARJ   Vaping Use   Vaping status: Never Used  Substance Use Topics   Alcohol use: Not Currently    Comment: 11/08/2018 "stopped 09/23/1998"   Drug use: Not Currently    Comment: 11/08/2018 "nothing in the 2000s"     Allergies   Patient has no known allergies.   Review of Systems Review of Systems Per HPI  Physical Exam Triage Vital Signs ED Triage Vitals  Encounter Vitals Group     BP 08/14/23 1722 (!) 160/67     Systolic BP Percentile --      Diastolic BP Percentile --      Pulse Rate 08/14/23 1722 62     Resp 08/14/23 1722 17     Temp 08/14/23 1722 98.2 F (36.8 C)     Temp Source 08/14/23 1722 Oral      SpO2 08/14/23 1722 97 %     Weight --      Height --      Head Circumference --      Peak Flow --      Pain Score 08/14/23 1721 5     Pain Loc --      Pain Education --      Exclude from Growth Chart --    No data found.  Updated Vital Signs BP (!) 160/67 (BP Location: Left Arm)   Pulse 62   Temp 98.2 F (36.8 C) (Oral)   Resp 17   SpO2 97%   Visual  Acuity Right Eye Distance:   Left Eye Distance:   Bilateral Distance:    Right Eye Near:   Left Eye Near:    Bilateral Near:     Physical Exam Vitals and nursing note reviewed.  Constitutional:      General: He is not in acute distress.    Appearance: Normal appearance. He is not toxic-appearing.  HENT:     Head: Normocephalic and atraumatic.     Right Ear: External ear normal.     Left Ear: External ear normal.     Nose: Nose normal. No congestion or rhinorrhea.     Mouth/Throat:     Mouth: Mucous membranes are moist.     Dentition: Abnormal dentition. Dental caries and dental abscesses present.     Pharynx: Oropharynx is clear. No pharyngeal swelling or posterior oropharyngeal erythema.     Tonsils: No tonsillar exudate.      Comments: Increased erythema and tenderness appreciated to left upper tooth and area marked Eyes:     General: No scleral icterus.    Extraocular Movements: Extraocular movements intact.  Pulmonary:     Effort: Pulmonary effort is normal. No respiratory distress.  Musculoskeletal:     Cervical back: Normal range of motion.  Lymphadenopathy:     Cervical: Cervical adenopathy present.  Skin:    General: Skin is warm and dry.     Coloration: Skin is not jaundiced or pale.     Findings: No erythema.  Neurological:     Mental Status: He is alert and oriented to person, place, and time.  Psychiatric:        Behavior: Behavior is cooperative.      UC Treatments / Results  Labs (all labs ordered are listed, but only abnormal results are displayed) Labs Reviewed - No data to  display  EKG   Radiology No results found.  Procedures Procedures (including critical care time)  Medications Ordered in UC Medications - No data to display  Initial Impression / Assessment and Plan / UC Course  I have reviewed the triage vital signs and the nursing notes.  Pertinent labs & imaging results that were available during my care of the patient were reviewed by me and considered in my medical decision making (see chart for details).   Patient is well-appearing, afebrile, not tachycardic, not tachypneic, oxygenating well on room air.  Patient is mildly hypertensive in triage today.  1. Pain, dental 2. Dental abscess Treat with Augmentin twice daily for 7 days, other supportive care discussed with patient; patient has follow-up with dentist scheduled  The patient was given the opportunity to ask questions.  All questions answered to their satisfaction.  The patient is in agreement to this plan.    Final Clinical Impressions(s) / UC Diagnoses   Final diagnoses:  Pain, dental  Dental abscess   Discharge Instructions   None    ED Prescriptions     Medication Sig Dispense Auth. Provider   amoxicillin-clavulanate (AUGMENTIN) 875-125 MG tablet Take 1 tablet by mouth every 12 (twelve) hours. 20 tablet Valentino Nose, NP      PDMP not reviewed this encounter.   Valentino Nose, NP 08/14/23 [phone]

## 2023-08-14 NOTE — ED Triage Notes (Signed)
Pt reports has dental pain. [facility] until [facility].

## 2023-08-14 NOTE — Discharge Instructions (Addendum)
Take Augmentin as prescribed to treat dental infection.  You can also take Tylenol 500 to 1000 mg every 6 hours and apply ice to the area to help with pain and swelling.

## 2023-08-17 ENCOUNTER — Ambulatory Visit: Payer: Medicare PPO | Attending: Cardiovascular Disease

## 2023-08-17 DIAGNOSIS — I5042 Chronic combined systolic (congestive) and diastolic (congestive) heart failure: Secondary | ICD-10-CM | POA: Diagnosis not present

## 2023-08-17 DIAGNOSIS — Z95810 Presence of automatic (implantable) cardiac defibrillator: Secondary | ICD-10-CM

## 2023-08-20 NOTE — Progress Notes (Signed)
Remote ICD transmission.   

## 2023-08-20 NOTE — Progress Notes (Signed)
EPIC Encounter for ICM Monitoring  Patient Name: Juan Norris is a 72 y.o. male Date: 08/20/2023 Primary Care Physican: [facility] Primary Cardiologist: Shari Prows Electrophysiologist: Mealor Bi-V Pacing:   99.2%       02/22/2023 Weight: 151 lbs 06/11/2023 Weight: 150-151 lbs 08/20/2023 Weight: 150 lbs                                                            Spoke with patient and heart failure questions reviewed.  Transmission results reviewed.  Pt asymptomatic for fluid accumulation.  Reports feeling well at this time and voices no complaints.       Diet:  Pt cooks at home and limits salt intake.    Optivol Thoracic impedance continues suggesting intermittent days with possible fluid accumulation within the last month.   Prescribed:  Furosemide 20 mg take 1 tablet by mouth daily as directed (11/19/2022 OV note states take PRN) Potassium 10 mEq take 1 tablet by mouth as directed when taking Lasix Spironolactone 25 mg take 0.5 tablet (12.5 mg total) daily   Labs: 11/19/2022 Creatinine 1.16, BUN 18, Potassium 5.4, Sodium 142, GFR 67 A complete set of results can be found in Results Review.   Recommendations:   No changes and encouraged to call if experiencing any fluid symptoms.   Follow-up plan: ICM clinic phone appointment on 09/20/2023.   91 day device clinic remote transmission 11/10/2023.      EP/Cardiology Office Visits:  Recall 05/29/2024 with Dr Nelly Laurence.  Advised to call Dr Devin Going office to schedule over due appt.  Due 03/2023 appt with Dr Shari Prows (4 month f/u).   Copy of ICM check sent to Dr. Nelly Laurence.   3 month ICM trend: 08/20/2023.    12-14 Month ICM trend:     Karie Soda, RN 08/20/2023 4:07 PM

## 2023-09-20 ENCOUNTER — Ambulatory Visit (INDEPENDENT_AMBULATORY_CARE_PROVIDER_SITE_OTHER): Payer: Medicare PPO

## 2023-09-20 DIAGNOSIS — I5042 Chronic combined systolic (congestive) and diastolic (congestive) heart failure: Secondary | ICD-10-CM | POA: Diagnosis not present

## 2023-09-20 DIAGNOSIS — Z95810 Presence of automatic (implantable) cardiac defibrillator: Secondary | ICD-10-CM

## 2023-09-22 ENCOUNTER — Ambulatory Visit
Admission: EM | Admit: 2023-09-22 | Discharge: 2023-09-22 | Disposition: A | Discharge status: Home / Self Care | Payer: Medicare PPO | Attending: Internal Medicine | Admitting: Internal Medicine

## 2023-09-22 ENCOUNTER — Other Ambulatory Visit: Payer: Self-pay

## 2023-09-22 ENCOUNTER — Encounter: Payer: Self-pay | Admitting: *Deleted

## 2023-09-22 DIAGNOSIS — K0889 Other specified disorders of teeth and supporting structures: Secondary | ICD-10-CM

## 2023-09-22 DIAGNOSIS — K047 Periapical abscess without sinus: Secondary | ICD-10-CM

## 2023-09-22 MED ORDER — AMOXICILLIN-POT CLAVULANATE 875-125 MG PO TABS: 1.0000 | ORAL_TABLET | Freq: Two times a day (BID) | ORAL | 0 refills | Status: DC

## 2023-09-22 NOTE — ED Triage Notes (Signed)
[facility] Today he started having left upper facial pain. He has had issues with this before

## 2023-09-22 NOTE — ED Provider Notes (Addendum)
[facility]    CSN: [id number] Arrival date & time: 09/22/23  1414      History   Chief Complaint Chief Complaint  Patient presents with   Facial Pain    HPI Juan Norris is a 72 y.o. male.   Patient presents with left upper dental pain that started today around 11 AM.  Patient denies any injury to the area.  Reports that he has a history of recurrent infections in that area.  He was last treated for this in August with Augmentin.  He has an appointment with a dentist on October 21 to have his teeth pulled to treat this issue.  He has not taken anything for pain.     Past Medical History:  Diagnosis Date   Acute renal insufficiency    Anginal pain (HCC)    Chronic lower back pain    "due to 3 ruptured discs in my back" (11/08/2018)   High cholesterol    History of kidney stones 1982   "passed them"   History of radiation therapy    Right lung- 11/03/21-11/14/21- Dr. Joylene Draft   Hypertension    Lung cancer [facility])    Myocardial infarction (HCC) 03/2018   Systolic heart failure (HCC)    Tobacco use     Patient Active Problem List   Diagnosis Date Noted   COPD (chronic obstructive pulmonary disease) (HCC) 05/28/2022   Malignant neoplasm of bronchus of right lower lobe (HCC) 10/17/2021   Left leg numbness 08/31/2021   CHF (congestive heart failure) (HCC) 08/31/2021   HTN (hypertension) 08/31/2021   Chronic systolic dysfunction of left ventricle 11/08/2018   Tobacco use 03/31/2018   Acute combined systolic and diastolic heart failure (HCC) 03/31/2018   Acute renal insufficiency 03/31/2018   Elevated troponin    NSTEMI (non-ST elevated myocardial infarction) (HCC) 03/29/2018   CAD (coronary artery disease) 12/14/2012   Chest pain 04/07/2012   History of heart attack 04/07/2012    Past Surgical History:  Procedure Laterality Date   BIV ICD INSERTION CRT-D N/A 11/08/2018    Medtronic biventricular ICD implantation by Dr Johney Frame with His Bundle pacing  and a MDT 3830 lead placed into the LV port.    BRONCHIAL BIOPSY  10/13/2021   Procedure: BRONCHIAL BIOPSIES;  Surgeon: Leslye Peer, MD;  Location: [facility] ENDOSCOPY;  Service: Pulmonary;;   BRONCHIAL BRUSHINGS  10/13/2021   Procedure: BRONCHIAL BRUSHINGS;  Surgeon: Leslye Peer, MD;  Location: [facility] ENDOSCOPY;  Service: Pulmonary;;   BRONCHIAL NEEDLE ASPIRATION BIOPSY  10/13/2021   Procedure: BRONCHIAL NEEDLE ASPIRATION BIOPSIES;  Surgeon: Leslye Peer, MD;  Location: [facility] ENDOSCOPY;  Service: Pulmonary;;   BRONCHIAL WASHINGS  10/13/2021   Procedure: BRONCHIAL WASHINGS;  Surgeon: Leslye Peer, MD;  [facility] ENDOS[facility];  Service: Pulmonary;;   CARDIAC CATHETERIZATION     FIDUCIAL MARKER PLACEMENT  10/13/2021   Procedure: FIDUCIAL MARKER PLACEMENT;  Surgeon: Leslye Peer, MD;  [facility] ENDOSCOPY;  Service: Pulmonary;;   INGUINAL HERNIA REPAIR Bilateral    KNEE SURGERY Left 1990s   "growth on the back of my knee removed"   LEFT HEART CATH AND CORONARY ANGIOGRAPHY N/A 03/30/2018   Procedure: LEFT HEART CATH AND CORONARY ANGIOGRAPHY;  Surgeon: Kathleene Hazel, MD;  [facility];  Service: Cardiovascular;  Laterality: N/A;   VIDEO BRONCHOSCOPY WITH ENDOBRONCHIAL NAVIGATION N/A 10/13/2021   Procedure: ROBOTIC VIDEO BRONCHOSCOPY WITH ENDOBRONCHIAL NAVIGATION;  Surgeon: Leslye Peer, MD;  Location: [facility]  ENDOSCOPY;  Service: Pulmonary;  Laterality: N/A;   VIDEO BRONCHOSCOPY WITH RADIAL ENDOBRONCHIAL ULTRASOUND  10/13/2021   Procedure: RADIAL ENDOBRONCHIAL ULTRASOUND;  Surgeon: Leslye Peer, MD;  [facility] ENDOSCOPY;  Service: Pulmonary;;       Home Medications    Prior to Admission medications   Medication Sig Start Date End Date Taking? Authorizing Provider  amoxicillin-clavulanate (AUGMENTIN) 875-125 MG tablet Take 1 tablet by mouth every 12 (twelve) hours. 09/22/23  Yes Gustavus Bryant, FNP  aspirin 81 MG EC tablet Take 1 tablet (81 mg total) by mouth  daily. 11/09/18  Yes Sheilah Pigeon, PA-C  atorvastatin (LIPITOR) 40 MG tablet Take 1 tablet (40 mg total) by mouth at bedtime. 10/13/21  Yes Leslye Peer, MD  carvedilol (COREG) 25 MG tablet Take 12.5 mg by mouth daily.   Yes [provider]  furosemide (LASIX) 20 MG tablet Take 1 tablet (20 mg total) by mouth daily. As directed 02/12/23  Yes Gaston Islam., NP  Multiple Vitamin (MULTIVITAMIN WITH MINERALS) TABS tablet Take 1 tablet by mouth daily.   Yes [provider]  potassium chloride (KLOR-CON) 10 MEQ tablet Take 1 tablet (10 mEq total) by mouth as directed. Take when taking the Lasix 11/19/22 09/22/23 Yes Dick, Devoria Albe., NP  sacubitril-valsartan (ENTRESTO) 24-26 MG Take 1 tablet by mouth 2 (two) times daily. Please make overdue appt with Dr. Delton See before anymore refills. 1st attempt 08/29/20  Yes Lars Masson, MD  spironolactone (ALDACTONE) 25 MG tablet Take 0.5 tablets (12.5 mg total) by mouth daily. 03/11/20  Yes Lars Masson, MD    Family History Family History  Problem Relation Age of Onset   Leukemia Mother    Cancer Father    Cancer Sister    Cancer Brother    CAD Brother     Social History Social History   Tobacco Use   Smoking status: Every Day    Current packs/day: 1.50    Average packs/day: 1.5 packs/day for 47.0 years (70.5 ttl pk-yrs)    Types: Cigarettes   Smokeless tobacco: Never   Tobacco comments:    1 cigarette smoked daily 05/28/22 ARJ   Vaping Use   Vaping status: Some Days  Substance Use Topics   Alcohol use: Not Currently    Comment: 11/08/2018 "stopped 09/23/1998"   Drug use: Not Currently    Comment: 11/08/2018 "nothing in the 2000s"     Allergies   Patient has no known allergies.   Review of Systems Review of Systems Per HPI  Physical Exam Triage Vital Signs ED Triage Vitals  Encounter Vitals Group     BP 09/22/23 1454 127/66     Systolic BP Percentile --      Diastolic BP Percentile --       Pulse Rate 09/22/23 1454 66     Resp 09/22/23 1454 18     Temp 09/22/23 1454 98 F (36.7 C)     Temp Source 09/22/23 1454 Oral     SpO2 09/22/23 1454 97 %     Weight --      Height --      Head Circumference --      Peak Flow --      Pain Score 09/22/23 1451 5     Pain Loc --      Pain Education --      Exclude from Growth Chart --    No data found.  Updated Vital Signs BP  127/66 (BP Location: Left Arm)   Pulse 66   Temp 98 F (36.7 C) (Oral)   Resp 18   SpO2 97%   Visual Acuity Right Eye Distance:   Left Eye Distance:   Bilateral Distance:    Right Eye Near:   Left Eye Near:    Bilateral Near:     Physical Exam Constitutional:      General: He is not in acute distress.    Appearance: Normal appearance. He is not toxic-appearing or diaphoretic.  HENT:     Head: Normocephalic and atraumatic.     Mouth/Throat:     Dentition: Abnormal dentition. Dental tenderness and gingival swelling present.     Comments: Patient has mild gingival swelling and erythema surrounding left upper anterior dentition.  Patient missing several teeth as well. Eyes:     Extraocular Movements: Extraocular movements intact.     Conjunctiva/sclera: Conjunctivae normal.  Pulmonary:     Effort: Pulmonary effort is normal.  Neurological:     General: No focal deficit present.     Mental Status: He is alert and oriented to person, place, and time. Mental status is at baseline.  Psychiatric:        Mood and Affect: Mood normal.        Behavior: Behavior normal.        Thought Content: Thought content normal.        Judgment: Judgment normal.      UC Treatments / Results  Labs (all labs ordered are listed, but only abnormal results are displayed) Labs Reviewed - No data to display  EKG   Radiology No results found.  Procedures Procedures (including critical care time)  Medications Ordered in UC Medications - No data to display  Initial Impression / Assessment and Plan / UC  Course  I have reviewed the triage vital signs and the nursing notes.  Pertinent labs & imaging results that were available during my care of the patient were reviewed by me and considered in my medical decision making (see chart for details).     Physical exam is concerning for dental infection so will treat with Augmentin antibiotic. Crcl is 54 so no dosage adjustment necessary.  Encouraged patient to keep scheduled appointment with dentist for further evaluation and management.  Patient verbalized understanding and was agreeable with plan. Final Clinical Impressions(s) / UC Diagnoses   Final diagnoses:  Dental infection   Discharge Instructions   None    ED Prescriptions     Medication Sig Dispense Auth. Provider   amoxicillin-clavulanate (AUGMENTIN) 875-125 MG tablet Take 1 tablet by mouth every 12 (twelve) hours. 14 tablet [place], Acie Fredrickson, [place]      PDMP not reviewed this encounter.   Gustavus Bryant, [place] 09/22/23 1525    Gustavus Bryant, [place] 09/22/23 [phone]

## 2023-09-28 NOTE — Progress Notes (Signed)
EPIC Encounter for ICM Monitoring  Patient Name: SOTIRIOS NAVARRO is a 72 y.o. male Date: 09/28/2023 Primary Care Physican: [facility] Primary Cardiologist: Shari Prows Electrophysiologist: Mealor Bi-V Pacing:   99.7%       02/22/2023 Weight: 151 lbs 06/11/2023 Weight: 150-151 lbs 08/20/2023 Weight: 150 lbs                                                            Transmission results reviewed.  .       Diet:  Pt cooks at home and limits salt intake.    Optivol Thoracic impedance continues suggesting normal fluid levels within the last month.   Prescribed:  Furosemide 20 mg take 1 tablet by mouth daily as directed (11/19/2022 OV note states take PRN) Potassium 10 mEq take 1 tablet by mouth as directed when taking Lasix Spironolactone 25 mg take 0.5 tablet (12.5 mg total) daily   Labs: 11/19/2022 Creatinine 1.16, BUN 18, Potassium 5.4, Sodium 142, GFR 67 A complete set of results can be found in Results Review.   Recommendations:   No changes.   Follow-up plan: ICM clinic phone appointment on 10/25/2023.   91 day device clinic remote transmission 11/10/2023.      EP/Cardiology Office Visits:  Recall 05/29/2024 with Dr Nelly Laurence.  Aware to call Dr Devin Going office to schedule over due appt.  Due 03/2023 appt with Dr Shari Prows (4 month f/u).   Copy of ICM check sent to Dr. Nelly Laurence.   3 month ICM trend: 09/20/2023.    12-14 Month ICM trend:     Karie Soda, RN 09/28/2023 3:31 PM

## 2023-10-25 ENCOUNTER — Ambulatory Visit: Payer: Medicare PPO | Attending: Cardiovascular Disease

## 2023-10-31 ENCOUNTER — Emergency Department (HOSPITAL_COMMUNITY)
Admission: EM | Admit: 2023-10-31 | Discharge: 2023-11-01 | Disposition: A | Discharge status: Home / Self Care | Payer: No Typology Code available for payment source | Attending: Emergency Medicine | Admitting: Emergency Medicine

## 2023-10-31 ENCOUNTER — Encounter (HOSPITAL_COMMUNITY): Payer: Self-pay | Admitting: Emergency Medicine

## 2023-10-31 DIAGNOSIS — M62830 Muscle spasm of back: Secondary | ICD-10-CM | POA: Insufficient documentation

## 2023-10-31 DIAGNOSIS — I110 Hypertensive heart disease with heart failure: Secondary | ICD-10-CM | POA: Diagnosis not present

## 2023-10-31 DIAGNOSIS — M545 Low back pain, unspecified: Secondary | ICD-10-CM | POA: Diagnosis present

## 2023-10-31 DIAGNOSIS — I5020 Unspecified systolic (congestive) heart failure: Secondary | ICD-10-CM | POA: Diagnosis not present

## 2023-10-31 DIAGNOSIS — Z7982 Long term (current) use of aspirin: Secondary | ICD-10-CM | POA: Insufficient documentation

## 2023-10-31 DIAGNOSIS — M62838 Other muscle spasm: Secondary | ICD-10-CM

## 2023-10-31 DIAGNOSIS — Z79899 Other long term (current) drug therapy: Secondary | ICD-10-CM | POA: Insufficient documentation

## 2023-10-31 DIAGNOSIS — I2510 Atherosclerotic heart disease of native coronary artery without angina pectoris: Secondary | ICD-10-CM | POA: Insufficient documentation

## 2023-10-31 DIAGNOSIS — Z85118 Personal history of other malignant neoplasm of bronchus and lung: Secondary | ICD-10-CM | POA: Insufficient documentation

## 2023-10-31 DIAGNOSIS — M5136 Other intervertebral disc degeneration, lumbar region with discogenic back pain only: Secondary | ICD-10-CM | POA: Diagnosis not present

## 2023-10-31 LAB — CBC
HCT: 39.7 % (ref 39.0–52.0)
Hemoglobin: 13.4 g/dL (ref 13.0–17.0)
MCH: 29.6 pg (ref 26.0–34.0)
MCHC: 33.8 g/dL (ref 30.0–36.0)
MCV: 87.8 fL (ref 80.0–100.0)
Platelets: 236 10*3/uL (ref 150–400)
RBC: 4.52 MIL/uL (ref 4.22–5.81)
RDW: 13.2 % (ref 11.5–15.5)
WBC: 8.5 10*3/uL (ref 4.0–10.5)
nRBC: 0 % (ref 0.0–0.2)

## 2023-10-31 LAB — COMPREHENSIVE METABOLIC PANEL
ALT: 21 U/L (ref 0–44)
AST: 25 U/L (ref 15–41)
Albumin: 4 g/dL (ref 3.5–5.0)
Alkaline Phosphatase: 87 U/L (ref 38–126)
Anion gap: 10 (ref 5–15)
BUN: 15 mg/dL (ref 8–23)
CO2: 20 mmol/L — ABNORMAL LOW (ref 22–32)
Calcium: 9.6 mg/dL (ref 8.9–10.3)
Chloride: 106 mmol/L (ref 98–111)
Creatinine, Ser: 1.17 mg/dL (ref 0.61–1.24)
GFR, Estimated: >60 mL/min (ref >60)
Glucose, Bld: 119 mg/dL — ABNORMAL HIGH (ref 70–99)
Potassium: 3.8 mmol/L (ref 3.5–5.1)
Sodium: 136 mmol/L (ref 135–145)
Total Bilirubin: 0.8 mg/dL (ref <1.2)
Total Protein: 7.4 g/dL (ref 6.5–8.1)

## 2023-10-31 LAB — URINALYSIS, ROUTINE W REFLEX MICROSCOPIC
Bilirubin Urine: NEGATIVE
Glucose, UA: NEGATIVE mg/dL
Hgb urine dipstick: NEGATIVE
Ketones, ur: NEGATIVE mg/dL
Leukocytes,Ua: NEGATIVE
Nitrite: NEGATIVE
Protein, ur: NEGATIVE mg/dL
Specific Gravity, Urine: 1.009 (ref 1.005–1.030)
pH: 6 (ref 5.0–8.0)

## 2023-10-31 MED ORDER — OXYCODONE-ACETAMINOPHEN 5-325 MG PO TABS
1.0000 | ORAL_TABLET | Freq: Once | ORAL | Status: AC
  Administered 2023-10-31: 1 via ORAL
  Filled 2023-10-31: qty 1

## 2023-10-31 NOTE — ED Triage Notes (Signed)
Pt here from home with c/o low back pain that started 3 days ago and got worse tonight , pt hx of kidney stones back in 84 , pain is worse on palpation

## 2023-11-01 ENCOUNTER — Emergency Department (HOSPITAL_COMMUNITY): Payer: No Typology Code available for payment source

## 2023-11-01 DIAGNOSIS — M5136 Other intervertebral disc degeneration, lumbar region with discogenic back pain only: Secondary | ICD-10-CM | POA: Diagnosis not present

## 2023-11-01 MED ORDER — METHOCARBAMOL 500 MG PO TABS
500.0000 mg | ORAL_TABLET | Freq: Once | ORAL | Status: AC
  Administered 2023-11-01: 500 mg via ORAL
  Filled 2023-11-01: qty 1

## 2023-11-01 MED ORDER — IBUPROFEN 400 MG PO TABS
400.0000 mg | ORAL_TABLET | Freq: Once | ORAL | Status: AC
Start: 2023-11-01 — End: 2023-11-01
  Administered 2023-11-01: 400 mg via ORAL
  Filled 2023-11-01: qty 1

## 2023-11-01 MED ORDER — METHOCARBAMOL 500 MG PO TABS
500.0000 mg | ORAL_TABLET | Freq: Two times a day (BID) | ORAL | 0 refills | Status: DC
Start: 2023-11-01 — End: 2023-11-12

## 2023-11-01 NOTE — ED Provider Notes (Signed)
[place] EMERGENCY DEPARTMENT AT [facility] Provider Note   CSN: [id number] Arrival date & time: 10/31/23  2131     History  Chief Complaint  Patient presents with   Back Pain    Juan Norris is a 72 y.o. male.  The history is provided by the patient.  Back Pain Associated symptoms: no abdominal pain, no dysuria, no fever, no numbness and no weakness   Patient with history of hypertension, CAD presents with low back pain.  This pain started around 3 days ago and has been gradually worsening.  No falls or trauma No new leg weakness.  No previous back surgery.  No urinary or fecal incontinence He reports having this pain decades ago while stationed [place] for [employer] No fevers are reported. No urinary symptoms or dysuria    Past Medical History:  Diagnosis Date   Acute renal insufficiency    Anginal pain (HCC)    Chronic lower back pain    "due to 3 ruptured discs in my back" (11/08/2018)   High cholesterol    History of kidney stones 1982   "passed them"   History of radiation therapy    Right lung- 11/03/21-11/14/21- Dr. Joylene Draft   Hypertension    Lung cancer [facility])    Myocardial infarction (HCC) 03/2018   Systolic heart failure (HCC)    Tobacco use     Home Medications Prior to Admission medications   Medication Sig Start Date End Date Taking? Authorizing Provider  methocarbamol (ROBAXIN) 500 MG tablet Take 1 tablet (500 mg total) by mouth 2 (two) times daily. 11/01/23  Yes Zadie Rhine, MD  aspirin 81 MG EC tablet Take 1 tablet (81 mg total) by mouth daily. 11/09/18   Sheilah Pigeon, PA-C  atorvastatin (LIPITOR) 40 MG tablet Take 1 tablet (40 mg total) by mouth at bedtime. 10/13/21   Leslye Peer, MD  carvedilol (COREG) 25 MG tablet Take 12.5 mg by mouth daily.    [provider]  furosemide (LASIX) 20 MG tablet Take 1 tablet (20 mg total) by mouth daily. As directed 02/12/23   Gaston Islam., NP  Multiple Vitamin  (MULTIVITAMIN WITH MINERALS) TABS tablet Take 1 tablet by mouth daily.    [provider]  sacubitril-valsartan (ENTRESTO) 24-26 MG Take 1 tablet by mouth 2 (two) times daily. Please make overdue appt with Dr. Delton See before anymore refills. 1st attempt 08/29/20   Lars Masson, MD  spironolactone (ALDACTONE) 25 MG tablet Take 0.5 tablets (12.5 mg total) by mouth daily. 03/11/20   Lars Masson, MD      Allergies    Patient has no known allergies.    Review of Systems   Review of Systems  Constitutional:  Negative for fever.  Gastrointestinal:  Negative for abdominal pain.  Genitourinary:  Negative for dysuria.  Musculoskeletal:  Positive for back pain.  Neurological:  Negative for weakness and numbness.    Physical Exam Updated Vital Signs BP (!) 114/47 (BP Location: Right Arm)   Pulse 60   Temp (!) 97.5 F (36.4 C) (Oral)   Resp 20   SpO2 100%  Physical Exam CONSTITUTIONAL: Elderly, uncomfortable appearing HEAD: Normocephalic/atraumatic EYES: EOMI/PERRL ENMT: Mucous membranes moist, poor dentition NECK: supple no meningeal signs SPINE/BACK:entire spine nontender  No bruising/crepitance/stepoffs noted to spine Right-sided paralumbar tenderness CV: S1/S2 noted, no murmurs/rubs/gallops noted LUNGS: Lungs are clear to auscultation bilaterally, no apparent distress ABDOMEN: soft, nontender GU:no cva tenderness NEURO: Awake/alert,  equal motor 5/5 strength noted with the following: hip flexion/knee flexion/extension, foot dorsi/plantar flexion, great toe extension intact bilaterally, no sensory deficit in any dermatome.  Patient is able to stand unassisted  ED Results / Procedures / Treatments   Labs (all labs ordered are listed, but only abnormal results are displayed) Labs Reviewed  COMPREHENSIVE METABOLIC PANEL - Abnormal; Notable for the following components:      Result Value   CO2 20 (*)    Glucose, Bld 119 (*)    All other components within normal  limits  URINALYSIS, ROUTINE W REFLEX MICROSCOPIC - Abnormal; Notable for the following components:   APPearance HAZY (*)    All other components within normal limits  CBC    EKG None  Radiology DG Lumbar Spine Complete  Result Date: 11/01/2023 CLINICAL DATA:  Pain EXAM: LUMBAR SPINE - COMPLETE 4+ VIEW COMPARISON:  08/21/2020 FINDINGS: Normal alignment. No fracture. Disc spaces maintained. Anterior degenerative spurring. Degenerative facet disease throughout the lumbar spine. No acute bony abnormality. IMPRESSION: No acute bony abnormality. Electronically Signed   By: Charlett Nose M.D.   On: 11/01/2023 03:39    Procedures Procedures    Medications Ordered in ED Medications  oxyCODONE-acetaminophen (PERCOCET/ROXICET) 5-325 MG per tablet 1 tablet (1 tablet Oral Given 10/31/23 2201)  ibuprofen (ADVIL) tablet 400 mg (400 mg Oral Given 11/01/23 0320)  methocarbamol (ROBAXIN) tablet 500 mg (500 mg Oral Given 11/01/23 0320)    ED Course/ Medical Decision Making/ A&P Clinical Course as of 11/01/23 0433  Mon Nov 01, 2023  4098 Patient reports low back pain for the past several days.  He had no neurodeficits or concerning history.  X-ray was reviewed and negative.  He feels much improved after ibuprofen and Robaxin.  This will be prescribed for patient he will follow with his PCP at the [facility] [[place]]    Clinical Course User Index [DW] Zadie Rhine, MD                                 Medical Decision Making Amount and/or Complexity of Data Reviewed Labs: ordered. Radiology: ordered.  Risk Prescription drug management.   This patient presents to the ED for concern of back pain, this involves an extensive number of treatment options, and is a complaint that carries with it a high risk of complications and morbidity.  The differential diagnosis includes but is not limited to muscle strain, compression fracture, epidural abscess, discitis, AAA, UTI, pyelonephritis, ureteral stone, acute  myelopathy, pathologic fracture  Comorbidities that complicate the patient evaluation: Patient's presentation is complicated by their history of CAD   Additional history obtained: Additional history obtained from family Records reviewed  outpatient records reviewed  Lab Tests: I Ordered, and personally interpreted labs.  The pertinent results include: Labs overall unremarkable  Imaging Studies ordered: I ordered imaging studies including X-ray lumbar spine   I independently visualized and interpreted imaging which showed no acute finding I agree with the radiologist interpretation  Medicines ordered and prescription drug management: I ordered medication including ibuprofen and Robaxin for pain Reevaluation of the patient after these medicines showed that the patient    improved  Reevaluation: After the interventions noted above, I reevaluated the patient and found that they have :improved  Complexity of problems addressed: Patient's presentation is most consistent with  acute presentation with potential threat to life or bodily function  Disposition: After consideration of the diagnostic results  and the patient's response to treatment,  I feel that the patent would benefit from discharge   .  ,        Final Clinical Impression(s) / ED Diagnoses Final diagnoses:  Muscle spasm    Rx / DC Orders ED Discharge Orders          Ordered    methocarbamol (ROBAXIN) 500 MG tablet  2 times daily        11/01/23 0432              Zadie Rhine, MD 11/01/23 [phone]

## 2023-11-01 NOTE — ED Notes (Signed)
Pt in NAD at d/c from ED. A&O. Ambulatory with steady gait. Respirations even & unlabored. Skin warm & dry. Pt verbalized understanding of d/c teaching including follow up care, medications and reasons to return to the ED. No needs or questions expressed at d/c.

## 2023-11-10 ENCOUNTER — Ambulatory Visit (INDEPENDENT_AMBULATORY_CARE_PROVIDER_SITE_OTHER): Payer: Medicare PPO

## 2023-11-10 DIAGNOSIS — I428 Other cardiomyopathies: Secondary | ICD-10-CM

## 2023-11-10 LAB — CUP PACEART REMOTE DEVICE CHECK
Battery Remaining Longevity: 17 mo
Battery Voltage: 2.89 V
Brady Statistic AP VP Percent: 24.13 %
Brady Statistic AP VS Percent: 0.01 %
Brady Statistic AS VP Percent: 75.84 %
Brady Statistic AS VS Percent: 0.02 %
Brady Statistic RA Percent Paced: 24.08 %
Brady Statistic RV Percent Paced: 99.68 %
Date Time Interrogation Session: 20241127042405
HighPow Impedance: 56 Ohm
Implantable Lead Connection Status: 753985
Implantable Lead Connection Status: 753985
Implantable Lead Connection Status: 753985
Implantable Lead Implant Date: 20191126
Implantable Lead Implant Date: 20191126
Implantable Lead Implant Date: 20191126
Implantable Lead Location: 753859
Implantable Lead Location: 753860
Implantable Lead Location: 753860
Implantable Lead Model: 3830
Implantable Lead Model: 5076
Implantable Pulse Generator Implant Date: 20191126
Lead Channel Impedance Value: 228 Ohm
Lead Channel Impedance Value: 266 Ohm
Lead Channel Impedance Value: 266 Ohm
Lead Channel Impedance Value: 285 Ohm
Lead Channel Impedance Value: 399 Ohm
Lead Channel Impedance Value: 437 Ohm
Lead Channel Pacing Threshold Amplitude: 0.5 V
Lead Channel Pacing Threshold Amplitude: 0.5 V
Lead Channel Pacing Threshold Amplitude: 0.625 V
Lead Channel Pacing Threshold Pulse Width: 0.4 ms
Lead Channel Pacing Threshold Pulse Width: 0.4 ms
Lead Channel Pacing Threshold Pulse Width: 0.4 ms
Lead Channel Sensing Intrinsic Amplitude: 1.875 mV
Lead Channel Sensing Intrinsic Amplitude: 1.875 mV
Lead Channel Sensing Intrinsic Amplitude: 12.375 mV
Lead Channel Sensing Intrinsic Amplitude: 12.375 mV
Lead Channel Setting Pacing Amplitude: 1.5 V
Lead Channel Setting Pacing Amplitude: 2.5 V
Lead Channel Setting Pacing Amplitude: 2.5 V
Lead Channel Setting Pacing Pulse Width: 0.4 ms
Lead Channel Setting Pacing Pulse Width: 0.4 ms
Lead Channel Setting Sensing Sensitivity: 0.3 mV
Zone Setting Status: 755011
Zone Setting Status: 755011

## 2023-11-12 ENCOUNTER — Ambulatory Visit
Admission: EM | Admit: 2023-11-12 | Discharge: 2023-11-12 | Disposition: A | Discharge status: Home / Self Care | Payer: No Typology Code available for payment source | Attending: Physician Assistant | Admitting: Physician Assistant

## 2023-11-12 ENCOUNTER — Encounter (HOSPITAL_COMMUNITY): Payer: Self-pay

## 2023-11-12 ENCOUNTER — Other Ambulatory Visit: Payer: Self-pay

## 2023-11-12 ENCOUNTER — Inpatient Hospital Stay (HOSPITAL_COMMUNITY)
Admission: EM | Admit: 2023-11-12 | Discharge: 2023-11-16 | DRG: 981 | Disposition: A | Discharge status: Home / Self Care | Payer: No Typology Code available for payment source | Attending: Family Medicine | Admitting: Family Medicine

## 2023-11-12 ENCOUNTER — Emergency Department (HOSPITAL_COMMUNITY): Payer: No Typology Code available for payment source

## 2023-11-12 DIAGNOSIS — R270 Ataxia, unspecified: Secondary | ICD-10-CM | POA: Diagnosis not present

## 2023-11-12 DIAGNOSIS — E7800 Pure hypercholesterolemia, unspecified: Secondary | ICD-10-CM | POA: Diagnosis present

## 2023-11-12 DIAGNOSIS — Z8249 Family history of ischemic heart disease and other diseases of the circulatory system: Secondary | ICD-10-CM | POA: Diagnosis not present

## 2023-11-12 DIAGNOSIS — I110 Hypertensive heart disease with heart failure: Secondary | ICD-10-CM | POA: Diagnosis present

## 2023-11-12 DIAGNOSIS — J449 Chronic obstructive pulmonary disease, unspecified: Secondary | ICD-10-CM | POA: Diagnosis present

## 2023-11-12 DIAGNOSIS — R066 Hiccough: Secondary | ICD-10-CM | POA: Diagnosis present

## 2023-11-12 DIAGNOSIS — Z515 Encounter for palliative care: Secondary | ICD-10-CM | POA: Diagnosis not present

## 2023-11-12 DIAGNOSIS — Z01818 Encounter for other preprocedural examination: Secondary | ICD-10-CM | POA: Insufficient documentation

## 2023-11-12 DIAGNOSIS — E119 Type 2 diabetes mellitus without complications: Secondary | ICD-10-CM | POA: Insufficient documentation

## 2023-11-12 DIAGNOSIS — W19XXXA Unspecified fall, initial encounter: Secondary | ICD-10-CM | POA: Diagnosis not present

## 2023-11-12 DIAGNOSIS — C7931 Secondary malignant neoplasm of brain: Principal | ICD-10-CM

## 2023-11-12 DIAGNOSIS — R42 Dizziness and giddiness: Principal | ICD-10-CM

## 2023-11-12 DIAGNOSIS — K7689 Other specified diseases of liver: Secondary | ICD-10-CM | POA: Diagnosis present

## 2023-11-12 DIAGNOSIS — G936 Cerebral edema: Secondary | ICD-10-CM | POA: Diagnosis present

## 2023-11-12 DIAGNOSIS — H532 Diplopia: Secondary | ICD-10-CM | POA: Diagnosis present

## 2023-11-12 DIAGNOSIS — I2510 Atherosclerotic heart disease of native coronary artery without angina pectoris: Secondary | ICD-10-CM | POA: Diagnosis present

## 2023-11-12 DIAGNOSIS — C3491 Malignant neoplasm of unspecified part of right bronchus or lung: Secondary | ICD-10-CM | POA: Diagnosis not present

## 2023-11-12 DIAGNOSIS — Z7982 Long term (current) use of aspirin: Secondary | ICD-10-CM

## 2023-11-12 DIAGNOSIS — I252 Old myocardial infarction: Secondary | ICD-10-CM

## 2023-11-12 DIAGNOSIS — Z806 Family history of leukemia: Secondary | ICD-10-CM | POA: Diagnosis not present

## 2023-11-12 DIAGNOSIS — G9389 Other specified disorders of brain: Secondary | ICD-10-CM | POA: Diagnosis not present

## 2023-11-12 DIAGNOSIS — Z79899 Other long term (current) drug therapy: Secondary | ICD-10-CM | POA: Diagnosis not present

## 2023-11-12 DIAGNOSIS — Z85118 Personal history of other malignant neoplasm of bronchus and lung: Secondary | ICD-10-CM | POA: Diagnosis not present

## 2023-11-12 DIAGNOSIS — Z87442 Personal history of urinary calculi: Secondary | ICD-10-CM

## 2023-11-12 DIAGNOSIS — K0530 Chronic periodontitis, unspecified: Secondary | ICD-10-CM | POA: Insufficient documentation

## 2023-11-12 DIAGNOSIS — Z923 Personal history of irradiation: Secondary | ICD-10-CM

## 2023-11-12 DIAGNOSIS — I5022 Chronic systolic (congestive) heart failure: Secondary | ICD-10-CM | POA: Diagnosis present

## 2023-11-12 DIAGNOSIS — C3431 Malignant neoplasm of lower lobe, right bronchus or lung: Secondary | ICD-10-CM | POA: Insufficient documentation

## 2023-11-12 DIAGNOSIS — Z66 Do not resuscitate: Secondary | ICD-10-CM | POA: Diagnosis present

## 2023-11-12 DIAGNOSIS — R739 Hyperglycemia, unspecified: Secondary | ICD-10-CM | POA: Insufficient documentation

## 2023-11-12 DIAGNOSIS — F17210 Nicotine dependence, cigarettes, uncomplicated: Secondary | ICD-10-CM | POA: Diagnosis present

## 2023-11-12 DIAGNOSIS — C799 Secondary malignant neoplasm of unspecified site: Secondary | ICD-10-CM | POA: Diagnosis present

## 2023-11-12 DIAGNOSIS — Z860100 Personal history of colon polyps, unspecified: Secondary | ICD-10-CM | POA: Insufficient documentation

## 2023-11-12 LAB — DIFFERENTIAL
Abs Immature Granulocytes: 0.03 10*3/uL (ref 0.00–0.07)
Basophils Absolute: 0 10*3/uL (ref 0.0–0.1)
Basophils Relative: 0 %
Eosinophils Absolute: 0.1 10*3/uL (ref 0.0–0.5)
Eosinophils Relative: 1 %
Immature Granulocytes: 0 %
Lymphocytes Relative: 15 %
Lymphs Abs: 1.2 10*3/uL (ref 0.7–4.0)
Monocytes Absolute: 0.5 10*3/uL (ref 0.1–1.0)
Monocytes Relative: 6 %
Neutro Abs: 6 10*3/uL (ref 1.7–7.7)
Neutrophils Relative %: 78 %

## 2023-11-12 LAB — URINALYSIS, ROUTINE W REFLEX MICROSCOPIC
Bacteria, UA: NONE SEEN
Bilirubin Urine: NEGATIVE
Glucose, UA: NEGATIVE mg/dL
Hgb urine dipstick: NEGATIVE
Ketones, ur: NEGATIVE mg/dL
Leukocytes,Ua: NEGATIVE
Nitrite: NEGATIVE
Protein, ur: NEGATIVE mg/dL
Specific Gravity, Urine: 1.034 — ABNORMAL HIGH (ref 1.005–1.030)
pH: 6 (ref 5.0–8.0)

## 2023-11-12 LAB — COMPREHENSIVE METABOLIC PANEL
ALT: 28 U/L (ref 0–44)
AST: 28 U/L (ref 15–41)
Albumin: 3.6 g/dL (ref 3.5–5.0)
Alkaline Phosphatase: 73 U/L (ref 38–126)
Anion gap: 7 (ref 5–15)
BUN: 11 mg/dL (ref 8–23)
CO2: 24 mmol/L (ref 22–32)
Calcium: 9.2 mg/dL (ref 8.9–10.3)
Chloride: 111 mmol/L (ref 98–111)
Creatinine, Ser: 0.99 mg/dL (ref 0.61–1.24)
GFR, Estimated: >60 mL/min (ref >60)
Glucose, Bld: 123 mg/dL — ABNORMAL HIGH (ref 70–99)
Potassium: 4.2 mmol/L (ref 3.5–5.1)
Sodium: 142 mmol/L (ref 135–145)
Total Bilirubin: 0.7 mg/dL (ref <1.2)
Total Protein: 6.9 g/dL (ref 6.5–8.1)

## 2023-11-12 LAB — CBC
HCT: 40.6 % (ref 39.0–52.0)
Hemoglobin: 13.4 g/dL (ref 13.0–17.0)
MCH: 29.6 pg (ref 26.0–34.0)
MCHC: 33 g/dL (ref 30.0–36.0)
MCV: 89.6 fL (ref 80.0–100.0)
Platelets: 199 10*3/uL (ref 150–400)
RBC: 4.53 MIL/uL (ref 4.22–5.81)
RDW: 13.3 % (ref 11.5–15.5)
WBC: 7.8 10*3/uL (ref 4.0–10.5)
nRBC: 0 % (ref 0.0–0.2)

## 2023-11-12 LAB — I-STAT CHEM 8, ED
BUN: 12 mg/dL (ref 8–23)
Calcium, Ion: 1.24 mmol/L (ref 1.15–1.40)
Chloride: 108 mmol/L (ref 98–111)
Creatinine, Ser: 1 mg/dL (ref 0.61–1.24)
Glucose, Bld: 115 mg/dL — ABNORMAL HIGH (ref 70–99)
HCT: 38 % — ABNORMAL LOW (ref 39.0–52.0)
Hemoglobin: 12.9 g/dL — ABNORMAL LOW (ref 13.0–17.0)
Potassium: 4.1 mmol/L (ref 3.5–5.1)
Sodium: 145 mmol/L (ref 135–145)
TCO2: 24 mmol/L (ref 22–32)

## 2023-11-12 LAB — RAPID URINE DRUG SCREEN, HOSP PERFORMED
Amphetamines: NOT DETECTED
Barbiturates: NOT DETECTED
Benzodiazepines: NOT DETECTED
Cocaine: NOT DETECTED
Opiates: NOT DETECTED
Tetrahydrocannabinol: NOT DETECTED

## 2023-11-12 LAB — POCT FASTING CBG KUC MANUAL ENTRY: POCT Glucose (KUC): 130 mg/dL — AB (ref 70–99)

## 2023-11-12 LAB — ETHANOL: Alcohol, Ethyl (B): <10 mg/dL (ref <10)

## 2023-11-12 MED ORDER — IOHEXOL 350 MG/ML SOLN
75.0000 mL | Freq: Once | INTRAVENOUS | Status: AC | PRN
  Administered 2023-11-12: 75 mL via INTRAVENOUS

## 2023-11-12 MED ORDER — ENOXAPARIN SODIUM 40 MG/0.4ML IJ SOSY
40.0000 mg | PREFILLED_SYRINGE | INTRAMUSCULAR | Status: DC
Start: 2023-11-12 — End: 2023-11-16
  Administered 2023-11-12 – 2023-11-16 (×5): 40 mg via SUBCUTANEOUS
  Filled 2023-11-12 (×5): qty 0.4

## 2023-11-12 MED ORDER — ACETAMINOPHEN 500 MG PO TABS: 1000.0000 mg | ORAL_TABLET | Freq: Four times a day (QID) | ORAL | Status: DC | PRN

## 2023-11-12 MED ORDER — CARVEDILOL 12.5 MG PO TABS
12.5000 mg | ORAL_TABLET | Freq: Every day | ORAL | Status: DC
  Administered 2023-11-12 – 2023-11-16 (×5): 12.5 mg via ORAL
  Filled 2023-11-12 (×5): qty 1

## 2023-11-12 MED ORDER — METOCLOPRAMIDE HCL 5 MG/ML IJ SOLN
10.0000 mg | Freq: Once | INTRAMUSCULAR | Status: AC
  Administered 2023-11-12: 10 mg via INTRAVENOUS
  Filled 2023-11-12: qty 2

## 2023-11-12 MED ORDER — ACETAMINOPHEN 325 MG PO TABS: 650.0000 mg | ORAL_TABLET | Freq: Four times a day (QID) | ORAL | Status: DC | PRN

## 2023-11-12 MED ORDER — SACUBITRIL-VALSARTAN 24-26 MG PO TABS
1.0000 | ORAL_TABLET | Freq: Two times a day (BID) | ORAL | Status: DC
  Administered 2023-11-12 – 2023-11-13 (×3): 1 via ORAL
  Filled 2023-11-12 (×4): qty 1

## 2023-11-12 MED ORDER — SODIUM CHLORIDE 0.9 % IV SOLN
4.0000 mg | Freq: Three times a day (TID) | INTRAVENOUS | Status: DC
  Administered 2023-11-12 – 2023-11-13 (×2): 4 mg via INTRAVENOUS
  Filled 2023-11-12 (×6): qty 0.4

## 2023-11-12 MED ORDER — ATORVASTATIN CALCIUM 40 MG PO TABS
40.0000 mg | ORAL_TABLET | Freq: Every day | ORAL | Status: DC
  Administered 2023-11-12 – 2023-11-15 (×4): 40 mg via ORAL
  Filled 2023-11-12 (×4): qty 1

## 2023-11-12 MED ORDER — ASPIRIN 81 MG PO TBEC
81.0000 mg | DELAYED_RELEASE_TABLET | Freq: Every day | ORAL | Status: DC
  Administered 2023-11-12 – 2023-11-14 (×3): 81 mg via ORAL
  Filled 2023-11-12 (×4): qty 1

## 2023-11-12 MED ORDER — NICOTINE 21 MG/24HR TD PT24
21.0000 mg | MEDICATED_PATCH | Freq: Every day | TRANSDERMAL | Status: DC
  Administered 2023-11-12 – 2023-11-16 (×5): 21 mg via TRANSDERMAL
  Filled 2023-11-12 (×5): qty 1

## 2023-11-12 MED ORDER — SODIUM CHLORIDE 0.9 % IV SOLN
4.0000 mg | Freq: Two times a day (BID) | INTRAVENOUS | Status: DC
  Filled 2023-11-12 (×2): qty 0.4

## 2023-11-12 MED ORDER — DIPHENHYDRAMINE HCL 50 MG/ML IJ SOLN
12.5000 mg | Freq: Once | INTRAMUSCULAR | Status: AC
  Administered 2023-11-12: 12.5 mg via INTRAVENOUS
  Filled 2023-11-12: qty 1

## 2023-11-12 MED ORDER — SPIRONOLACTONE 12.5 MG HALF TABLET
12.5000 mg | ORAL_TABLET | Freq: Every day | ORAL | Status: DC
  Administered 2023-11-12 – 2023-11-16 (×5): 12.5 mg via ORAL
  Filled 2023-11-12 (×5): qty 1

## 2023-11-12 MED ORDER — ADULT MULTIVITAMIN W/MINERALS CH
1.0000 | ORAL_TABLET | Freq: Every day | ORAL | Status: DC
  Administered 2023-11-12 – 2023-11-16 (×5): 1 via ORAL
  Filled 2023-11-12 (×5): qty 1

## 2023-11-12 NOTE — ED Provider Notes (Signed)
[facility]    CSN: [id number] Arrival date & time: 11/12/23  0820      History   Chief Complaint Chief Complaint  Patient presents with   Vision Problems    Seeing Double    HPI Juan Norris is a 72 y.o. male.   Patient presents today with a 3-day history of ataxia, diplopia, dizziness.  He denies any focal weakness or dysarthria.  He does report a headache that is worse over his right frontal region.  Several weeks ago (10/31/2023) he went to the emergency room and was given Robaxin.  He took this for several days but felt drunk whenever he took it so discontinued it.  He has not had it in over a week.  He reports that over the past several days he has been feeling out of it and has had difficulty with his daily activities.  He was walking and felt that he was going straight but was going towards the left and ended up in a field.  He denies any chest pain, shortness of breath, nausea, vomiting.  He is eating and drinking normally.  Denies any additional medication changes.  He does not drink alcohol and denies any drug use.  He denies history of neurological condition.    Past Medical History:  Diagnosis Date   Acute renal insufficiency    Anginal pain (HCC)    Chronic lower back pain    "due to 3 ruptured discs in my back" (11/08/2018)   High cholesterol    History of kidney stones 1982   "passed them"   History of radiation therapy    Right lung- 11/03/21-11/14/21- Dr. Joylene Draft   Hypertension    Lung cancer [facility])    Myocardial infarction (HCC) 03/2018   Systolic heart failure (HCC)    Tobacco use     Patient Active Problem List   Diagnosis Date Noted   Chronic periodontitis, unspecified 11/12/2023   Encounter for other preprocedural examination 11/12/2023   History of colonic polyps 11/12/2023   Malignant neoplasm of lower lobe, right bronchus or lung (HCC) 11/12/2023   Malignant neoplasm of lower lobe of right lung (HCC) 11/12/2023   Type 2  diabetes mellitus without complications (HCC) 11/12/2023   COPD (chronic obstructive pulmonary disease) (HCC) 05/28/2022   Malignant neoplasm of bronchus of right lower lobe (HCC) 10/17/2021   Left leg numbness 08/31/2021   CHF (congestive heart failure) (HCC) 08/31/2021   HTN (hypertension) 08/31/2021   Chronic systolic dysfunction of left ventricle 11/08/2018   Tobacco use 03/31/2018   Acute combined systolic and diastolic heart failure (HCC) 03/31/2018   Acute renal insufficiency 03/31/2018   Elevated troponin    NSTEMI (non-ST elevated myocardial infarction) (HCC) 03/29/2018   Bilateral inguinal hernia 01/24/2016   Chronic low back pain 07/10/2015   CAD (coronary artery disease) 12/14/2012   Chest pain 04/07/2012   History of heart attack 04/07/2012    Past Surgical History:  Procedure Laterality Date   BIV ICD INSERTION CRT-D N/A 11/08/2018    [employer] biventricular ICD implantation by Dr Johney Frame with His Bundle pacing and a MDT 3830 lead placed into the LV port.    BRONCHIAL BIOPSY  10/13/2021   Procedure: BRONCHIAL BIOPSIES;  Surgeon: Leslye Peer, MD;  Location: [facility] ENDOSCOPY;  Service: Pulmonary;;   BRONCHIAL BRUSHINGS  10/13/2021   Procedure: BRONCHIAL BRUSHINGS;  Surgeon: Leslye Peer, MD;  Location: [facility] ENDOSCOPY;  Service: Pulmonary;;   BRONCHIAL NEEDLE ASPIRATION BIOPSY  10/13/2021   Procedure: BRONCHIAL NEEDLE ASPIRATION BIOPSIES;  Surgeon: Leslye Peer, MD;  Location: [facility] ENDOSCOPY;  Service: Pulmonary;;   BRONCHIAL WASHINGS  10/13/2021   Procedure: BRONCHIAL WASHINGS;  Surgeon: Leslye Peer, MD;  Location: [facility] ENDOSCOPY;  Service: Pulmonary;;   CARDIAC CATHETERIZATION     FIDUCIAL MARKER PLACEMENT  10/13/2021   Procedure: FIDUCIAL MARKER PLACEMENT;  Surgeon: Leslye Peer, MD;  Location: [facility] ENDOSCOPY;  Service: Pulmonary;;   INGUINAL HERNIA REPAIR Bilateral    KNEE SURGERY Left 1990s   "growth on the back of my knee removed"   LEFT HEART CATH AND  CORONARY ANGIOGRAPHY N/A 03/30/2018   Procedure: LEFT HEART CATH AND CORONARY ANGIOGRAPHY;  Surgeon: Kathleene Hazel, MD;  Location: [facility] INVASIVE CV LAB;  Service: Cardiovascular;  Laterality: N/A;   VIDEO BRONCHOSCOPY WITH ENDOBRONCHIAL NAVIGATION N/A 10/13/2021   Procedure: ROBOTIC VIDEO BRONCHOSCOPY WITH ENDOBRONCHIAL NAVIGATION;  Surgeon: Leslye Peer, MD;  Location: [facility] ENDOSCOPY;  Service: Pulmonary;  Laterality: N/A;   VIDEO BRONCHOSCOPY WITH RADIAL ENDOBRONCHIAL ULTRASOUND  10/13/2021   Procedure: RADIAL ENDOBRONCHIAL ULTRASOUND;  Surgeon: Leslye Peer, MD;  Location: [facility] ENDOSCOPY;  Service: Pulmonary;;       Home Medications    Prior to Admission medications   Medication Sig Start Date End Date Taking? Authorizing Provider  aspirin 81 MG EC tablet Take 1 tablet (81 mg total) by mouth daily. 11/09/18   Sheilah Pigeon, PA-C  atorvastatin (LIPITOR) 40 MG tablet Take 1 tablet (40 mg total) by mouth at bedtime. 10/13/21   Leslye Peer, MD  carvedilol (COREG) 25 MG tablet Take 12.5 mg by mouth daily.    [provider]  furosemide (LASIX) 20 MG tablet Take 1 tablet (20 mg total) by mouth daily. As directed 02/12/23   Gaston Islam., NP  methocarbamol (ROBAXIN) 500 MG tablet Take 1 tablet (500 mg total) by mouth 2 (two) times daily. 11/01/23   Zadie Rhine, MD  Multiple Vitamin (MULTIVITAMIN WITH MINERALS) TABS tablet Take 1 tablet by mouth daily.    [provider]  sacubitril-valsartan (ENTRESTO) 24-26 MG Take 1 tablet by mouth 2 (two) times daily. Please make overdue appt with Dr. Delton See before anymore refills. 1st attempt 08/29/20   Lars Masson, MD  spironolactone (ALDACTONE) 25 MG tablet Take 0.5 tablets (12.5 mg total) by mouth daily. 03/11/20   Lars Masson, MD    Family History Family History  Problem Relation Age of Onset   Leukemia Mother    Cancer Father    Cancer Sister    Cancer Brother    CAD Brother     Social  History Social History   Tobacco Use   Smoking status: Every Day    Current packs/day: 1.50    Average packs/day: 1.5 packs/day for 47.0 years (70.5 ttl pk-yrs)    Types: Cigarettes   Smokeless tobacco: Never   Tobacco comments:    1 cigarette smoked daily 05/28/22 [facility]   Vaping Use   Vaping status: Never Used  Substance Use Topics   Alcohol use: Not Currently    Comment: 11/08/2018 "stopped 09/23/1998"   Drug use: Not Currently    Comment: 11/08/2018 "nothing in the 2000s"     Allergies   Patient has no known allergies.   Review of Systems Review of Systems  Constitutional:  Positive for activity change. Negative for appetite change, fatigue and fever.  Eyes:  Positive for visual disturbance. Negative for photophobia.  Respiratory:  Negative for shortness of breath.   Cardiovascular:  Negative for chest pain.  Gastrointestinal:  Negative for diarrhea, nausea and vomiting.  Neurological:  Positive for dizziness and headaches. Negative for syncope, facial asymmetry, speech difficulty, weakness and light-headedness.     Physical Exam Triage Vital Signs ED Triage Vitals  Encounter Vitals Group     BP 11/12/23 0828 (!) 177/64     Systolic BP Percentile --      Diastolic BP Percentile --      Pulse Rate 11/12/23 0828 71     Resp 11/12/23 0828 18     Temp 11/12/23 0828 98 F (36.7 C)     Temp Source 11/12/23 0828 Temporal     SpO2 11/12/23 0828 98 %     Weight 11/12/23 0829 147 lb 11.3 oz (67 kg)     Height 11/12/23 0829 5\' 6"  (1.676 m)     Head Circumference --      Peak Flow --      Pain Score 11/12/23 0829 2     Pain Loc --      Pain Education --      Exclude from Growth Chart --    No data found.  Updated Vital Signs BP (!) 177/64 (BP Location: Left Arm)   Pulse 71   Temp 98 F (36.7 C) (Temporal)   Resp 18   Ht 5\' 6"  (1.676 m)   Wt 147 lb 11.3 oz (67 kg)   SpO2 98%   BMI 23.84 kg/m   Visual Acuity Right Eye Distance: Unable to obtain Left Eye  Distance: Unable to obtain Bilateral Distance: Unable to obtain  Right Eye Near:   Left Eye Near:    Bilateral Near:     Physical Exam Vitals reviewed.  Constitutional:      General: He is awake.     Appearance: Normal appearance. He is well-developed. He is not ill-appearing.     Comments: Very pleasant male appears stated age in no acute distress holding his left eye closed  HENT:     Head: Normocephalic and atraumatic.     Right Ear: Tympanic membrane, ear canal and external ear normal. No hemotympanum.     Left Ear: Tympanic membrane, ear canal and external ear normal. No hemotympanum.     Nose: Nose normal.     Mouth/Throat:     Pharynx: Uvula midline. No oropharyngeal exudate or posterior oropharyngeal erythema.  Eyes:     Extraocular Movements: Extraocular movements intact.     Pupils: Pupils are equal, round, and reactive to light.  Cardiovascular:     Rate and Rhythm: Regular rhythm. Bradycardia present.     Heart sounds: Normal heart sounds, S1 normal and S2 normal. No murmur heard. Pulmonary:     Effort: Pulmonary effort is normal. No accessory muscle usage or respiratory distress.     Breath sounds: Normal breath sounds. No stridor. No wheezing, rhonchi or rales.     Comments: Clear to auscultation bilaterally Musculoskeletal:     Comments: Strength 4/5 bilateral upper and lower extremities.  Neurological:     General: No focal deficit present.     Mental Status: He is alert and oriented to person, place, and time.     Cranial Nerves: Cranial nerves 2-12 are intact.     Motor: Motor function is intact.     Coordination: Impaired rapid alternating movements.     Gait: Gait abnormal.  Psychiatric:        Behavior:  Behavior is cooperative.      UC Treatments / Results  Labs (all labs ordered are listed, but only abnormal results are displayed) Labs Reviewed  POCT FASTING CBG [facility] MANUAL ENTRY - Abnormal; Notable for the following components:      Result Value    POCT Glucose ([facility]) 130 (*)    All other components within normal limits    EKG   Radiology No results found.  Procedures Procedures (including critical care time)  Medications Ordered in UC Medications - No data to display  Initial Impression / Assessment and Plan / UC Course  I have reviewed the triage vital signs and the nursing notes.  Pertinent labs & imaging results that were available during my care of the patient were reviewed by me and considered in my medical decision making (see chart for details).     Given new onset neurological symptoms we discussed that the safest thing to do would be to go to the emergency room.  His EKG showed Atrial paced without ischemic changes; no change to previous from June 2024.  Random blood sugar was appropriate at 130.  He is agreeable to this but does not have anyone to transport him as he took the bus here.  [facility] was called to transport patient.  He was stable at the time of discharge.  Final Clinical Impressions(s) / UC Diagnoses   Final diagnoses:  Ataxia  Diplopia  Dizziness   Discharge Instructions   None    ED Prescriptions   None    PDMP not reviewed this encounter.   Jeani Hawking, PA-C 11/12/23 1610

## 2023-11-12 NOTE — Consult Note (Signed)
Reason for Consult:brain mets Referring Physician: EDP  Juan Norris is an 72 y.o. male.   HPI:  71 year old male presented to the hospital with "vision issues and problems with equilibrium." Patient has a history of lung cancer per his chart but he does not recall this right now. He does use tobacco. Sees Dr. Roselind Messier for radiation oncolgoy. Denies any headaches but endorses double vision and balance issues.   Past Medical History:  Diagnosis Date   Acute renal insufficiency    Anginal pain (HCC)    Chronic lower back pain    "due to 3 ruptured discs in my back" (11/08/2018)   High cholesterol    History of kidney stones 1982   "passed them"   History of radiation therapy    Right lung- 11/03/21-11/14/21- Dr. Joylene Draft   Hypertension    Lung cancer [facility])    Myocardial infarction (HCC) 03/2018   Systolic heart failure (HCC)    Tobacco use     Past Surgical History:  Procedure Laterality Date   BIV ICD INSERTION CRT-D N/A 11/08/2018    [employer] biventricular ICD implantation by Dr Johney Frame with His Bundle pacing and a MDT 3830 lead placed into the LV port.    BRONCHIAL BIOPSY  10/13/2021   Procedure: BRONCHIAL BIOPSIES;  Surgeon: Leslye Peer, MD;  Location: [facility] ENDOSCOPY;  Service: Pulmonary;;   BRONCHIAL BRUSHINGS  10/13/2021   Procedure: BRONCHIAL BRUSHINGS;  Surgeon: Leslye Peer, MD;  Location: [facility] ENDOSCOPY;  Service: Pulmonary;;   BRONCHIAL NEEDLE ASPIRATION BIOPSY  10/13/2021   Procedure: BRONCHIAL NEEDLE ASPIRATION BIOPSIES;  Surgeon: Leslye Peer, MD;  Location: [facility] ENDOSCOPY;  Service: Pulmonary;;   BRONCHIAL WASHINGS  10/13/2021   Procedure: BRONCHIAL WASHINGS;  Surgeon: Leslye Peer, MD;  Location: [facility] ENDOSCOPY;  Service: Pulmonary;;   CARDIAC CATHETERIZATION     FIDUCIAL MARKER PLACEMENT  10/13/2021   Procedure: FIDUCIAL MARKER PLACEMENT;  Surgeon: Leslye Peer, MD;  Location: [facility] ENDOSCOPY;  Service: Pulmonary;;   INGUINAL HERNIA REPAIR Bilateral     KNEE SURGERY Left 1990s   "growth on the back of my knee removed"   LEFT HEART CATH AND CORONARY ANGIOGRAPHY N/A 03/30/2018   Procedure: LEFT HEART CATH AND CORONARY ANGIOGRAPHY;  Surgeon: Kathleene Hazel, MD;  Location: [facility] INVASIVE CV LAB;  Service: Cardiovascular;  Laterality: N/A;   VIDEO BRONCHOSCOPY WITH ENDOBRONCHIAL NAVIGATION N/A 10/13/2021   Procedure: ROBOTIC VIDEO BRONCHOSCOPY WITH ENDOBRONCHIAL NAVIGATION;  Surgeon: Leslye Peer, MD;  Location: [facility] ENDOSCOPY;  Service: Pulmonary;  Laterality: N/A;   VIDEO BRONCHOSCOPY WITH RADIAL ENDOBRONCHIAL ULTRASOUND  10/13/2021   Procedure: RADIAL ENDOBRONCHIAL ULTRASOUND;  Surgeon: Leslye Peer, MD;  Location: [facility] ENDOSCOPY;  Service: Pulmonary;;    No Known Allergies  Social History   Tobacco Use   Smoking status: Every Day    Current packs/day: 1.50    Average packs/day: 1.5 packs/day for 47.0 years (70.5 ttl pk-yrs)    Types: Cigarettes   Smokeless tobacco: Never   Tobacco comments:    1 cigarette smoked daily 05/28/22 [facility]   Substance Use Topics   Alcohol use: Not Currently    Comment: 11/08/2018 "stopped 09/23/1998"    Family History  Problem Relation Age of Onset   Leukemia Mother    Cancer Father    Cancer Sister    Cancer Brother    CAD Brother      Review of Systems  Positive ROS: as above  All other systems  have been reviewed and were otherwise negative with the exception of those mentioned in the HPI and as above.  Objective: Vital signs in last 24 hours: Temp:  [97.9 F (36.6 C)-98 F (36.7 C)] 97.9 F (36.6 C) (11/29 0940) Pulse Rate:  [62-71] 65 (11/29 1317) Resp:  [16-18] 18 (11/29 1317) BP: (150-177)/(51-64) 151/51 (11/29 1317) SpO2:  [98 %-99 %] 98 % (11/29 1317) Weight:  [16 kg] 67 kg (11/29 0829)  General Appearance: Alert, cooperative, no distress, appears stated age Head: Normocephalic, without obvious abnormality, atraumatic Eyes: PERRL, conjunctiva/corneas clear, EOM's intact,  fundi benign, both eyes, right hemianopia      Neck: Supple, symmetrical, trachea midline, no adenopathy; thyroid: No enlargement/tenderness/nodules; no carotid bruit or JVD Back: Symmetric, no curvature, ROM normal, no CVA tenderness Lungs:  respirations unlabored Heart: Regular rate and rhythm Extremities: Extremities normal, atraumatic, no cyanosis or edema Pulses: 2+ and symmetric all extremities Skin: Skin color, texture, turgor normal, no rashes or lesions  NEUROLOGIC:   Mental status: A&O x4, no aphasia, good attention span, poor Memory and fund of knowledge Motor Exam - grossly normal, normal tone and bulk Sensory Exam - grossly normal Reflexes: symmetric, no pathologic reflexes, No Hoffman's, No clonus Coordination - grossly normal Gait - not tested Balance - not tested Cranial Nerves: I: smell Not tested  II: visual acuity  OS: na    OD: na  II: visual fields Right hemianopia  II: pupils Equal, round, reactive to light  III,VII: ptosis None  III,IV,VI: extraocular muscles  Full ROM  V: mastication Normal  V: facial light touch sensation  Normal  V,VII: corneal reflex  Present  VII: facial muscle function - upper  Normal  VII: facial muscle function - lower Normal  VIII: hearing Not tested  IX: soft palate elevation  Normal  IX,X: gag reflex Present  XI: trapezius strength  5/5  XI: sternocleidomastoid strength 5/5  XI: neck flexion strength  5/5  XII: tongue strength  Normal    Data Review Lab Results  Component Value Date   WBC 7.8 11/12/2023   HGB 12.9 (L) 11/12/2023   HCT 38.0 (L) 11/12/2023   MCV 89.6 11/12/2023   PLT 199 11/12/2023   Lab Results  Component Value Date   NA 145 11/12/2023   K 4.1 11/12/2023   CL 108 11/12/2023   CO2 24 11/12/2023   BUN 12 11/12/2023   CREATININE 1.00 11/12/2023   GLUCOSE 115 (H) 11/12/2023   Lab Results  Component Value Date   INR 1.1 08/31/2021    Radiology: CT ANGIO HEAD NECK W WO CM  Result Date:  11/12/2023 CLINICAL DATA:  vertigo, double vision, R facial droop suspected intraparenchymal EXAM: CT ANGIOGRAPHY HEAD AND NECK WITH AND WITHOUT CONTRAST TECHNIQUE: Multidetector CT imaging of the head and neck was performed using the standard protocol during bolus administration of intravenous contrast. Multiplanar CT image reconstructions and MIPs were obtained to evaluate the vascular anatomy. Carotid stenosis measurements (when applicable) are obtained utilizing NASCET criteria, using the distal internal carotid diameter as the denominator. RADIATION DOSE REDUCTION: This exam was performed according to the departmental dose-optimization program which includes automated exposure control, adjustment of the mA and/or kV according to patient size and/or use of iterative reconstruction technique. CONTRAST:  75mL OMNIPAQUE IOHEXOL 350 MG/ML SOLN COMPARISON:  CT chest March 17, 2022.  MRI head September 01, 2021. FINDINGS: CT HEAD FINDINGS Brain: Suspected masses in the left occipital lobe and right posterior fossa, likely metastases. Substantial  surrounding edema with mass effect with effacement of the fourth ventricle. No hydrocephalus. No definite superimposed acute large vascular territory infarct. No mass occupying acute hemorrhage. Vascular: No hyperdense vessel identified. Skull: No acute fracture. Sinuses/Orbits: No acute findings. Other: No mastoid effusions. Review of the MIP images confirms the above findings CTA NECK FINDINGS Aortic arch: Aortic atherosclerosis. Great vessel origins are patent. Right carotid system: Atherosclerosis at the carotid bifurcation without greater than 50% stenosis. Left carotid system: Atherosclerosis at the carotid bifurcation without greater than 50% stenosis. Irregularity of the left ICA at the skull base with mildly beaded appearance. Vertebral arteries: Co dominant. No significant (greater than 50%) stenosis. Skeleton: No acute abnormality on limited assessment. Lower  cervical degenerative change. Other neck: Approximally 3.1 x 2.5 cm cystic lesion subjacent to the strap musculature and abutting the hyoid and thyroid cartilage. Upper chest: Emphysema. New nodularity and pleural thickening in the left upper lobe, concerning for malignancy. Review of the MIP images confirms the above findings CTA HEAD FINDINGS Anterior circulation: Bilateral intracranial ICAs, MCAs, and ACAs are patent without proximal high-grade stenosis. Posterior circulation: Bilateral intradural vertebral arteries, basilar artery and bilateral posterior cerebral arteries are patent without proximal high-grade stenosis. Venous sinuses: As permitted by contrast timing, patent. Review of the MIP images confirms the above findings IMPRESSION: 1. Suspected masses in the left occipital lobe and right posterior fossa, concerning for metastases given the clinical history. Recommend MRI with contrast to further evaluate. 2. Substantial surrounding edema with mass effect and effacement of the fourth ventricle. No hydrocephalus at this time. 3. New nodularity and pleural thickening in the left upper lobe, concerning for malignancy. Recommend dedicated CT of the chest with contrast to fully characterize. 4. No emergent large vessel occlusion or proximal high-grade stenosis. 5. 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 A follow-up CT of the neck with contrast is recommended to fully evaluate and help exclude carcinoma. 6. Mild irregularity of the left ICA at the skull base with beaded appearance. This could be secondary to fibromuscular dysplasia or atherosclerosis. 7. Aortic Atherosclerosis (ICD10-I70.0) and Emphysema (ICD10-J43.9). Urgent findings discussed with Dr. Jarold Motto via telephone at 12:18 p.m. Electronically Signed   By: Feliberto Harts M.D.   On: 11/12/2023 12:28      Assessment/Plan: 72 year old male presented to the ED today with double vision and balance problems. CT head shows a left occipital lobe mass and right posterior fossa mass consistent with metastatic disease. Recommend hospitalist admission for metastatic workup with CT chest abdomen and pelvis. Rad onc needs to be consulted too for further staging. Recommend decadron 4mg  q8hr. MRI with and without contrast as well.   Juan Norris 11/12/2023 1:26 PM

## 2023-11-12 NOTE — ED Notes (Signed)
Got patient into a gown on the monitor did EKG shown to er provider patient is resting with call bell in reach  

## 2023-11-12 NOTE — Plan of Care (Signed)

## 2023-11-12 NOTE — Plan of Care (Signed)
FMTS Brief Progress Note  S: Juan Norris is a 72 y.o. male with a pertinent history of SCC of RLL 2022, HFrEF, MI 2018, HLD, and HTN presenting with worsening dizziness and diplopia found to have brain masses on CT imaging suspected to be metastasis from his lung neoplasm which was treated with radiation therapy in 2022.  Saw patient at bedside with Dr. Marisue Humble.  Patient is up in bed eating fistulae from [employer], states that it is very dry.  Otherwise states that he is feeling well.  Notes that his daughter just left and would appreciate a call from [place].  He is awaiting his brother to visit.  Updated patient that cardiology will be here to see him tomorrow in the morning.  Patient not needing anything is normal.  Called patient's daughter Juan Norris to provide updates and answer questions.  She is understanding concerned about patient's brain mets and wants to know how treatable her cancer is.  Stated that brain metastasis is not a good prognostic sign but unsure what ultimate prognosis is.  Deferred diagnosis and treatment plan to oncology, who will be by tomorrow AM.  Daughter is aware and will come by in the morning to see them as well.  She is tearful on the phone but expresses gratitude for patient's care.  O: BP (!) 156/50   Pulse 61   Temp 98.1 F (36.7 C) (Oral)   Resp 18   Ht 5\' 6"  (1.676 m)   Wt 67 kg   SpO2 98%   BMI 23.84 kg/m   General: Age-appropriate, resting comfortably in bed and eating [employer], NAD, conversing appropriately, alert and at baseline. HEENT: Normocephalic.  EOM normal. Cardiovascular: Regular rate and rhythm. Normal S1/S2. No murmurs, rubs, or gallops appreciated. 2+ radial pulses. Pulmonary: Clear bilaterally to ascultation. No increased WOB, no accessory muscle usage on room air. No wheezes, crackles, or rhonchi. Abdominal: No tenderness to deep or light palpation. No rebound or guarding. Extremities: No peripheral edema  bilaterally.   A/P: Assessment & Plan Metastatic cancer (HCC) Has history of squamous cell carcinoma of RLL. CTA Head with left occipital lobe and right posterior fossa masses, most consistent with brain metastases.  Admitted for metastatic workup per Neurosurgery.  Patient not a candidate for MRI brain imaging given ICD.  Oncology to see in AM and updated patient and daughter. - Tylenol 1000 q6 PRN for headache - PT/OT eval and treat in AM - Falls precautions - CBC, BMP ordered and will follow up in AM - Greatly appreciate Oncology assistance, they will follow up with patient in the AM and will follow up their further recommendations:  - Continue decadron 4 mg Q8h  - Consideromg involving Rad Onc later  Nelia Shi, MD 11/12/2023, 8:05 PM PGY-1, [facility] Night Resident  Please page [phone] with questions.

## 2023-11-12 NOTE — Assessment & Plan Note (Signed)
Has history of squamous cell carcinoma of RLL. CTA Head with left occipital lobe and right posterior fossa masses, most consistent with brain metastases.  Admitted for metastatic workup per Neurosurgery.  Patient not a candidate for MRI brain imaging given ICD.  Oncology to see in AM and updated patient and daughter. - Tylenol 1000 q6 PRN for headache - PT/OT eval and treat in AM - Falls precautions - CBC, BMP ordered and will follow up in AM - Greatly appreciate Oncology assistance, they will follow up with patient in the AM and will follow up their further recommendations:  - Continue decadron 4 mg Q8h  - Consideromg involving Rad Onc later

## 2023-11-12 NOTE — ED Provider Notes (Signed)
[place] EMERGENCY DEPARTMENT AT [facility] Provider Note   CSN: [id number] Arrival date & time: 11/12/23  0932     History  Chief Complaint  Patient presents with   Dizziness    Juan Norris is a 72 y.o. male.  72 year old male with history of lung cancer in remission, tobacco use, COPD, and CHF status post defibrillator who presents to the emergency department with dizziness.  Patient reports that since Wednesday has been having double vision and dizziness.  Has to close 1 eye to not see double.  Feels like it is drunk when he walks.  Has difficulty characterizing the dizziness is more of a room spinning or lightheaded sensation.  No exacerbating or alleviating factors.  Also has a right sided frontal headache.  Worse with turning his head.  Says that he has not had a stroke before and is not on blood thinners.  Went to urgent care today who referred him to the emergency department.       Home Medications Prior to Admission medications   Medication Sig Start Date End Date Taking? Authorizing Provider  aspirin 81 MG EC tablet Take 1 tablet (81 mg total) by mouth daily. 11/09/18   Sheilah Pigeon, PA-C  atorvastatin (LIPITOR) 40 MG tablet Take 1 tablet (40 mg total) by mouth at bedtime. 10/13/21   Leslye Peer, MD  carvedilol (COREG) 25 MG tablet Take 12.5 mg by mouth daily.    [provider]  furosemide (LASIX) 20 MG tablet Take 1 tablet (20 mg total) by mouth daily. As directed 02/12/23   Gaston Islam., NP  methocarbamol (ROBAXIN) 500 MG tablet Take 1 tablet (500 mg total) by mouth 2 (two) times daily. 11/01/23   Zadie Rhine, MD  Multiple Vitamin (MULTIVITAMIN WITH MINERALS) TABS tablet Take 1 tablet by mouth daily.    [provider]  sacubitril-valsartan (ENTRESTO) 24-26 MG Take 1 tablet by mouth 2 (two) times daily. Please make overdue appt with Dr. Delton See before anymore refills. 1st attempt 08/29/20   Lars Masson, MD   spironolactone (ALDACTONE) 25 MG tablet Take 0.5 tablets (12.5 mg total) by mouth daily. 03/11/20   Lars Masson, MD      Allergies    Patient has no known allergies.    Review of Systems   Review of Systems  Physical Exam Updated Vital Signs BP (!) 151/51   Pulse 65   Temp 97.9 F (36.6 C) (Oral)   Resp 18   SpO2 98%  Physical Exam Vitals and nursing note reviewed.  Constitutional:      General: He is not in acute distress.    Appearance: He is well-developed.  HENT:     Head: Normocephalic and atraumatic.     Right Ear: External ear normal.     Left Ear: External ear normal.     Nose: Nose normal.  Eyes:     Extraocular Movements: Extraocular movements intact.     Conjunctiva/sclera: Conjunctivae normal.     Pupils: Pupils are equal, round, and reactive to light.  Cardiovascular:     Rate and Rhythm: Normal rate and regular rhythm.     Heart sounds: Normal heart sounds.  Pulmonary:     Effort: Pulmonary effort is normal. No respiratory distress.     Breath sounds: Normal breath sounds.  Musculoskeletal:     Cervical back: Normal range of motion and neck supple.     Right lower leg: No edema.  Left lower leg: No edema.  Skin:    General: Skin is warm and dry.  Neurological:     Mental Status: He is alert.     Comments: MENTAL STATUS: AAOx3 CRANIAL NERVES: II: Pupils equal and reactive 4 mm BL, no RAPD, no VF deficits III, IV, VI: EOM intact, no gaze preference or deviation, no nystagmus. V: normal sensation to light touch in V1, V2, and V3 segments bilaterally VII: Right hemifacial weakness that involves the forehead VIII: normal hearing to speech and finger friction IX, X: normal palatal elevation, no uvular deviation XI: 5/5 head turn and 5/5 shoulder shrug bilaterally XII: midline tongue protrusion MOTOR: 5/5 strength in R shoulder flexion, elbow flexion and extension, and grip strength. 5/5 strength in L shoulder flexion, elbow flexion and  extension, and grip strength.  5/5 strength in R hip and knee flexion, knee extension, ankle plantar and dorsiflexion. 5/5 strength in L hip and knee flexion, knee extension, ankle plantar and dorsiflexion. SENSORY: Normal sensation to light touch in all extremities COORD: Normal finger to nose.  Does have difficulty with heel-to-shin   Psychiatric:        Mood and Affect: Mood normal.        Behavior: Behavior normal.     ED Results / Procedures / Treatments   Labs (all labs ordered are listed, but only abnormal results are displayed) Labs Reviewed  COMPREHENSIVE METABOLIC PANEL - Abnormal; Notable for the following components:      Result Value   Glucose, Bld 123 (*)    All other components within normal limits  I-STAT CHEM 8, ED - Abnormal; Notable for the following components:   Glucose, Bld 115 (*)    Hemoglobin 12.9 (*)    HCT 38.0 (*)    All other components within normal limits  ETHANOL  CBC  DIFFERENTIAL  RAPID URINE DRUG SCREEN, HOSP PERFORMED  URINALYSIS, ROUTINE W REFLEX MICROSCOPIC    EKG EKG Interpretation Date/Time:  Friday November 12 2023 09:39:30 EST Ventricular Rate:  63 PR Interval:  138 QRS Duration:  175 QT Interval:  480 QTC Calculation: 492 R Axis:   -81  Text Interpretation: ATRIAL SENSING VENTRICULAR PACED RHYTHM Confirmed by Vonita Moss [phone]) on 11/12/2023 10:47:10 AM  Radiology CT ANGIO HEAD NECK W WO CM  Result Date: 11/12/2023 CLINICAL DATA:  vertigo, double vision, R facial droop suspected intraparenchymal EXAM: CT ANGIOGRAPHY HEAD AND NECK WITH AND WITHOUT CONTRAST TECHNIQUE: Multidetector CT imaging of the head and neck was performed using the standard protocol during bolus administration of intravenous contrast. Multiplanar CT image reconstructions and MIPs were obtained to evaluate the vascular anatomy. Carotid stenosis measurements (when applicable) are obtained utilizing NASCET criteria, using the distal internal carotid  diameter as the denominator. RADIATION DOSE REDUCTION: This exam was performed according to the departmental dose-optimization program which includes automated exposure control, adjustment of the mA and/or kV according to patient size and/or use of iterative reconstruction technique. CONTRAST:  75mL OMNIPAQUE IOHEXOL 350 MG/ML SOLN COMPARISON:  CT chest March 17, 2022.  MRI head September 01, 2021. FINDINGS: CT HEAD FINDINGS Brain: Suspected masses in the left occipital lobe and right posterior fossa, likely metastases. Substantial surrounding edema with mass effect with effacement of the fourth ventricle. No hydrocephalus. No definite superimposed acute large vascular territory infarct. No mass occupying acute hemorrhage. Vascular: No hyperdense vessel identified. Skull: No acute fracture. Sinuses/Orbits: No acute findings. Other: No mastoid effusions. Review of the MIP images confirms the above findings  CTA NECK FINDINGS Aortic arch: Aortic atherosclerosis. Great vessel origins are patent. Right carotid system: Atherosclerosis at the carotid bifurcation without greater than 50% stenosis. Left carotid system: Atherosclerosis at the carotid bifurcation without greater than 50% stenosis. Irregularity of the left ICA at the skull base with mildly beaded appearance. Vertebral arteries: Co dominant. No significant (greater than 50%) stenosis. Skeleton: No acute abnormality on limited assessment. Lower cervical degenerative change. Other neck: Approximally 3.1 x 2.5 cm cystic lesion subjacent to the strap musculature and abutting the hyoid and thyroid cartilage. Upper chest: Emphysema. New nodularity and pleural thickening in the left upper lobe, concerning for malignancy. Review of the MIP images confirms the above findings CTA HEAD FINDINGS Anterior circulation: Bilateral intracranial ICAs, MCAs, and ACAs are patent without proximal high-grade stenosis. Posterior circulation: Bilateral intradural vertebral arteries,  basilar artery and bilateral posterior cerebral arteries are patent without proximal high-grade stenosis. Venous sinuses: As permitted by contrast timing, patent. Review of the MIP images confirms the above findings IMPRESSION: 1. Suspected masses in the left occipital lobe and right posterior fossa, concerning for metastases given the clinical history. Recommend MRI with contrast to further evaluate. 2. Substantial surrounding edema with mass effect and effacement of the fourth ventricle. No hydrocephalus at this time. 3. New nodularity and pleural thickening in the left upper lobe, concerning for malignancy. Recommend dedicated CT of the chest with contrast to fully characterize. 4. No emergent large vessel occlusion or proximal high-grade stenosis. 5. 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 A follow-up CT of the neck with contrast is recommended to fully evaluate and help exclude carcinoma. 6. Mild irregularity of the left ICA at the skull base with beaded appearance. This could be secondary to fibromuscular dysplasia or atherosclerosis. 7. Aortic Atherosclerosis (ICD10-I70.0) and Emphysema (ICD10-J43.9). Urgent findings discussed with Dr. Jarold Motto via telephone at 12:18 p.m. Electronically Signed   By: Feliberto Harts M.D.   On: 11/12/2023 12:28    Procedures Procedures    Medications Ordered in ED Medications  dexamethasone (DECADRON) 4 mg in sodium chloride 0.9 % 50 mL IVPB (has no administration in time range)  metoCLOPramide (REGLAN) injection 10 mg (10 mg Intravenous Given 11/12/23 1037)  diphenhydrAMINE (BENADRYL) injection 12.5 mg (12.5 mg Intravenous Given 11/12/23 1036)  iohexol (OMNIPAQUE) 350 MG/ML injection 75 mL (75 mLs Intravenous Contrast Given 11/12/23 1158)    ED Course/ Medical Decision Making/ A&P Clinical Course as of 11/12/23 1345  Fri Nov 12, 2023  1042 Contacted by Radiology and patient is unable to get MRI due to his cardiac device [RP]  1217 Called by radiology. Appears to have mets in L occipital and R cerebellum. Effacement of the 4th ventricle. Recommends fu CT head with contrast.  [RP]  1227 Dr Roslynn Amble from oncology consulted. Agrees with neurosurgery consult. Decadron 4mg  IV BID. Will need rad onc eval. MRI brain wwo if possible.  [RP]  1318 Kimberly from neurosurgery aware.  [RP]  1341 Dr. Threasa Beards from family medicine to admit the patient.  Patient updated regarding the findings of his head CT. [RP]    Clinical Course User Index [RP] Rondel Baton, MD                                 Medical Decision Making Amount and/or Complexity of Data Reviewed  Labs: ordered. Radiology: ordered.  Risk Prescription drug management. Decision regarding hospitalization.   Juan Norris is a 72 y.o. male with comorbidities that complicate the patient evaluation including  lung cancer in remission, tobacco use, COPD, and CHF status post defibrillator who presents to the emergency department with dizziness.     Initial Ddx:  Stroke, ICH, hypoglycemia, brain met, aneurysm  MDM/Course:  Patient presents to the emergency department with 2 days of diplopia and difficulty walking.  On exam does have some double vision but no nystagmus noted.  Does have some difficulty with heel-to-shin as well.  Initially was concerned about a possible stroke outside of the window for intervention so CT and CTA were obtained which showed that he has an occipital and cerebellar mass concerning for possible metastases.  Patient unable to get MRI at this time because of his pacemaker.  Neurosurgery was consulted and will evaluate the patient shortly.  Oncology recommended starting the patient on Decadron and following up with radiation oncology.  Admitted to family medicine for additional management.   This patient presents to the ED for concern of complaints  listed in HPI, this involves an extensive number of treatment options, and is a complaint that carries with it a high risk of complications and morbidity. Disposition including potential need for admission considered.   Dispo: Admit to Floor  Records reviewed Outpatient Clinic Notes The following labs were independently interpreted: Chemistry and show no acute abnormality I independently reviewed the following imaging with scope of interpretation limited to determining acute life threatening conditions related to emergency care: CT Head and agree with the radiologist interpretation with the following exceptions: none I personally reviewed and interpreted cardiac monitoring: normal sinus rhythm  I personally reviewed and interpreted the pt's EKG: see above for interpretation  I have reviewed the patients home medications and made adjustments as needed Consults: Neurosurgery and oncology, family medicine Social Determinants of health:  Elderly  Portions of this note were generated with Scientist, clinical (histocompatibility and immunogenetics). Dictation errors may occur despite best attempts at proofreading.            Final Clinical Impression(s) / ED Diagnoses Final diagnoses:  Dizziness  Diplopia  Brain mass    Rx / DC Orders ED Discharge Orders     None         Rondel Baton, MD 11/12/23 1345

## 2023-11-12 NOTE — Assessment & Plan Note (Addendum)
Patient presenting with worsening diplopia, dizziness and falls x 1 week. Has history of squamous cell carcinoma of RLL. On arrival, CTA Head with left occipital lobe mass and right posterior fossa mass, most consistent with brain metastases. Oncology, Rad Onc, Neurosurgery have been consulted by the ED. Per NSGY, brain masses most consistent with mets and recommend admission for further metastatic workup. Patient unfortunately not a candidate for MRI brain imaging given ICD that is not MRI conditional.  - Admit to FMTS with attending Dr Jennette Kettle - Greatly appreciate Oncology recommendations.   - Continue decadron 4mg  q8 hours  - Oncology to see patient tomorrow  - Consider involving Rad Onc later - Consider CT Abd, Pelvis for further metastatic assessment - Tylenol 1000 q6 PRN for headache - Vital signs per floor protocol - AM CBC, BMP - PT/OT eval and treat - fall precautions

## 2023-11-12 NOTE — H&P (Addendum)
Hospital Admission History and Physical Service Pager: [phone]  Patient name: Juan Norris Medical record number: [id number] Date of Birth: Nov 12, 1951 Age: 72 y.o. Gender: male  Primary Care Provider: [facility] Consultants: Oncology, Rad Onc, Neurosurgery  Code Status: DNR Preferred Emergency Contact: Zaeden Selders (Daughter): [phone]  Chief Complaint: double vision and dizziness   Assessment and Plan: Juan Norris is a 72 y.o. male presenting with diplopia and dizziness found to have multiple brain masses on CT imaging. Differential for this patient's presentation of this includes:   Brain metastases: Most likely etiology given cancer history and CT imaging consistent with metastases.  Primary brain cancer: Less likely given multiple growths, more likely metastatic disease.  Assessment & Plan Metastatic cancer [facility]) Patient presenting with worsening diplopia, dizziness and falls x 1 week. Has history of squamous cell carcinoma of RLL. On arrival, CTA Head with left occipital lobe mass and right posterior fossa mass, most consistent with brain metastases. Oncology, Rad Onc, Neurosurgery have been consulted by the ED. Per NSGY, brain masses most consistent with mets and recommend admission for further metastatic workup. Patient unfortunately not a candidate for MRI brain imaging given ICD that is not MRI conditional.  - Admit to FMTS with attending Dr Jennette Kettle - Greatly appreciate Oncology recommendations.   - Continue decadron 4mg  q8 hours  - Oncology to see patient tomorrow  - Consider involving Rad Onc later - Consider CT Abd, Pelvis for further metastatic assessment - Tylenol 1000 q6 PRN for headache - Vital signs per floor protocol - AM CBC, BMP - PT/OT eval and treat - fall precautions    Chronic and Stable Conditions: HLD: continue home atorvastatin 40 daily Coronary disease: continue home aspirin 81 daily HTN: continue home Entresto 24-26 BID, home  spironolactone 12.5 daily  HFrEF: continue home carvedilol 25 daily and anti-hypertensives per above Tobacco use: continue home nicotine patch daily  FEN/GI: Regular VTE Prophylaxis: Lovenox  Disposition: Med-surg  History of Present Illness:  Juan Norris is a 72 y.o. male presenting with double vision and dizziness.  Over the last week, patient has had worsening double vision, intermittent headaches, and feeling imbalanced. These symptoms had particularly worsened in the last 2 days. Has had two falls in the past week, no reported LOC, head injury, or other bodily injury. No recent fever, SOB, cough, or congestion. No decreased appetite, nausea, vomiting, or diarrhea. Patient has known history of lung cancer, received radiation therapy and was in remission as of 2022. [facility]. Last seen there ~3 months ago.   In the ED, patient presented hypertensive to 177/64, with otherwise normal vitals. Initial CMP, CBC unremarkable. CT head with left occipital lobe mass and right posterior fossa mass consistent with metastatic disease. Patient started on decadron.   Review Of Systems: Per HPI   Pertinent Past Medical History: Lung cancer s/p radiation 2022 (in remission)  MI (2019) CAD HFrEF HLD HTN Remainder reviewed in history tab.   Pertinent Past Surgical History: Left heart cath (2019) Bronchoscopy, biopsy (2022)  Remainder reviewed in history tab.   Pertinent Social History: Tobacco use: 1-3 cigarettes/day Alcohol use: No Other Substance use: No Lives with grandson and his girlfriend  Pertinent Family History: Mother - Leukemia Father - Cancer Sister - Cancer Brother - Cancer, CAD Remainder reviewed in history tab.   Important Outpatient Medications: Aspirin 81 daily Atorvastatin 40 daily Carvedilol 12.5 daily Lasix  20 daily - unsure if taking  Robaxin 500 BID - not taking  Entresto BID Spironolactone 12.5  daily - unsure if taking, thinks so Remainder reviewed in medication history.   Objective: BP (!) 151/51   Pulse 65   Temp 97.9 F (36.6 C) (Oral)   Resp 18   SpO2 98%  Exam: General: No acute distress.  Sitting comfortably in room. Eyes: Sclera without injection bilaterally. ENTM: MMM. Neck: Supple.  Normal range of motion. Cardiovascular: Normal S1/S2.  No extra heart sounds.  Warm and well-perfused. Respiratory: Breathing comfortably on room air.  CTAB.  No increased WOB. Gastrointestinal: Bowel sounds present.  Soft.  Nontender.  Nondistended. MSK: No extremity swelling or tenderness to palpation. Derm: Warm.  Dry. Neuro: Alert and oriented.  Equal, intact sensation throughout upper and lower extremities.  Normal strength throughout extremities. Delayed finger-to-nose exam. Normal rapid alternating hand movements.  CN2: Diplopia CN3,4,6: PERRLA. EOMI CN5: Sensation intact BL CN7: Facial expressions symmetric CN8: Hearing intact BL CN10: regular speech CN11: turns head against resistance CN12: tongue midline Psych: Pleasant and appropriate.  Labs:  CBC   Recent Labs  Lab 11/12/23 1047 11/12/23 1050  WBC 7.8  --   HGB 13.4 12.9*  HCT 40.6 38.0*  PLT 199  --       CMP     Component Value Date/Time   NA 145 11/12/2023 1050   NA 142 11/19/2022 0920   K 4.1 11/12/2023 1050   CL 108 11/12/2023 1050   CO2 24 11/12/2023 1047   GLUCOSE 115 (H) 11/12/2023 1050   BUN 12 11/12/2023 1050   BUN 21 11/19/2022 0920   CREATININE 1.00 11/12/2023 1050   CALCIUM 9.2 11/12/2023 1047   PROT 6.9 11/12/2023 1047   PROT 7.1 02/12/2021 0802   ALBUMIN 3.6 11/12/2023 1047   ALBUMIN 4.1 02/12/2021 0802   AST 28 11/12/2023 1047   ALT 28 11/12/2023 1047   ALKPHOS 73 11/12/2023 1047   BILITOT 0.7 11/12/2023 1047   BILITOT 0.5 02/12/2021 0802   EGFR 67 11/19/2022 0920   GFRNONAA >60 11/12/2023 1047    UA unremarkable UDS negative  EKG: Paced rhythm.  Qtc 492.   Imaging  Studies Performed: CTA Head (11/29): left occipital lobe mass and right posterior fossa mass consistent with metastatic disease  Ivery Quale, MD 11/12/2023, 1:27 PM PGY-1, [facility] Family Medicine  FPTS Intern pager: [phone], text pages welcome Secure chat group [facility] Teaching Service   I was personally present and re-performed the exam and medical decision making and verified the service and findings are accurately documented in the resident's note.  Erick Alley, DO 11/12/2023 6:39 PM

## 2023-11-12 NOTE — ED Notes (Signed)
ED TO INPATIENT HANDOFF REPORT  ED Nurse Name and Phone #: [phone]  S Name/Age/Gender Juan Norris 72 y.o. male Room/Bed: 028C/028C  Code Status   Code Status: Prior  Home/SNF/Other Home Patient oriented to: self, place, time, and situation Is this baseline? Yes   Triage Complete: Triage complete  Chief Complaint Metastatic cancer [facility]) [C79.9]  Triage Note Coming from urgent care with c/o dizziness. Dizziness has been occurring for 3-4 days now. Last 2-3 days with intermediate dizziness and headaches and double vision.   No stroke signs  12 lead was normal   No changes   162/92 70 98%  130 CBG 98.8     Allergies No Known Allergies  Level of Care/Admitting Diagnosis ED Disposition     ED Disposition  Admit   Condition  --   Comment  Hospital Area: [facility] [[id number]]  Level of Care: Med-Surg [16]  May place patient in observation at [facility] or [facility] if equivalent level of care is available:: No  Covid Evaluation: Asymptomatic - no recent exposure (last 10 days) testing not required  Diagnosis: Metastatic cancer [facility]) [[id number]]  Admitting Physician: Nestor Ramp [[id number]]  Attending Physician: Nestor Ramp [[id number]]          B Medical/Surgery History Past Medical History:  Diagnosis Date   Acute renal insufficiency    Anginal pain (HCC)    Chronic lower back pain    "due to 3 ruptured discs in my back" (11/08/2018)   High cholesterol    History of kidney stones 1982   "passed them"   History of radiation therapy    Right lung- 11/03/21-11/14/21- Dr. Joylene Draft   Hypertension    Lung cancer [facility])    Myocardial infarction (HCC) 03/2018   Systolic heart failure (HCC)    Tobacco use    Past Surgical History:  Procedure Laterality Date   BIV ICD INSERTION CRT-D N/A 11/08/2018    Medtronic biventricular ICD implantation by Dr Johney Frame with His Bundle pacing and a MDT 3830 lead placed into the LV port.    BRONCHIAL  BIOPSY  10/13/2021   Procedure: BRONCHIAL BIOPSIES;  Surgeon: Leslye Peer, MD;  Location: [facility] ENDOSCOPY;  Service: Pulmonary;;   BRONCHIAL BRUSHINGS  10/13/2021   Procedure: BRONCHIAL BRUSHINGS;  Surgeon: Leslye Peer, MD;  Location: [facility] ENDOSCOPY;  Service: Pulmonary;;   BRONCHIAL NEEDLE ASPIRATION BIOPSY  10/13/2021   Procedure: BRONCHIAL NEEDLE ASPIRATION BIOPSIES;  Surgeon: Leslye Peer, MD;  Location: [facility] ENDOSCOPY;  Service: Pulmonary;;   BRONCHIAL WASHINGS  10/13/2021   Procedure: BRONCHIAL WASHINGS;  Surgeon: Leslye Peer, MD;  Location: [facility] ENDOSCOPY;  Service: Pulmonary;;   CARDIAC CATHETERIZATION     FIDUCIAL MARKER PLACEMENT  10/13/2021   Procedure: FIDUCIAL MARKER PLACEMENT;  Surgeon: Leslye Peer, MD;  Location: [facility] ENDOSCOPY;  Service: Pulmonary;;   INGUINAL HERNIA REPAIR Bilateral    KNEE SURGERY Left 1990s   "growth on the back of my knee removed"   LEFT HEART CATH AND CORONARY ANGIOGRAPHY N/A 03/30/2018   Procedure: LEFT HEART CATH AND CORONARY ANGIOGRAPHY;  Surgeon: Kathleene Hazel, MD;  Location: [facility] INVASIVE CV LAB;  Service: Cardiovascular;  Laterality: N/A;   VIDEO BRONCHOSCOPY WITH ENDOBRONCHIAL NAVIGATION N/A 10/13/2021   Procedure: ROBOTIC VIDEO BRONCHOSCOPY WITH ENDOBRONCHIAL NAVIGATION;  Surgeon: Leslye Peer, MD;  Location: [facility] ENDOSCOPY;  Service: Pulmonary;  Laterality: N/A;   VIDEO BRONCHOSCOPY WITH RADIAL ENDOBRONCHIAL ULTRASOUND  10/13/2021   Procedure:  RADIAL ENDOBRONCHIAL ULTRASOUND;  Surgeon: Leslye Peer, MD;  Location[facility] ENDOSCOPY;  Service: Pulmonary;;     A IV Location/Drains/Wounds Patient Lines/Drains/Airways Status     Active Line/Drains/Airways     Name Placement date Placement time Site Days   Peripheral IV 11/12/23 20 G Anterior;Distal;Right;Upper Arm 11/12/23  1036  Arm  less than 1            Intake/Output Last 24 hours No intake or output data in the 24 hours ending 11/12/23 1356  Labs/Imaging Results  for orders placed or performed during the hospital encounter of 11/12/23 (from the past 48 hour(s))  Ethanol     Status: None   Collection Time: 11/12/23 10:46 AM  Result Value Ref Range   Alcohol, Ethyl (B) <10 <10 mg/dL    Comment: (NOTE) Lowest detectable limit for serum alcohol is 10 mg/dL.  For medical purposes only. Performed at [facility], [address], [place], [place] [address]   CBC     Status: None   Collection Time: 11/12/23 10:47 AM  Result Value Ref Range   WBC 7.8 4.0 - 10.5 K/uL   RBC 4.53 4.22 - 5.81 MIL/uL   Hemoglobin 13.4 13.0 - 17.0 g/dL   HCT 60.4 54.0 - 98.1 %   MCV 89.6 80.0 - 100.0 fL   MCH 29.6 26.0 - 34.0 pg   MCHC 33.0 30.0 - 36.0 g/dL   RDW 19.1 47.8 - 29.5 %   Platelets 199 150 - 400 K/uL   nRBC 0.0 0.0 - 0.2 %    Comment: Performed at [facility], [address], [place], [place] [address]  Differential     Status: None   Collection Time: 11/12/23 10:47 AM  Result Value Ref Range   Neutrophils Relative % 78 %   Neutro Abs 6.0 1.7 - 7.7 K/uL   Lymphocytes Relative 15 %   Lymphs Abs 1.2 0.7 - 4.0 K/uL   Monocytes Relative 6 %   Monocytes Absolute 0.5 0.1 - 1.0 K/uL   Eosinophils Relative 1 %   Eosinophils Absolute 0.1 0.0 - 0.5 K/uL   Basophils Relative 0 %   Basophils Absolute 0.0 0.0 - 0.1 K/uL   Immature Granulocytes 0 %   Abs Immature Granulocytes 0.03 0.00 - 0.07 K/uL    Comment: Performed at [facility], [address], [place], [place] [address]  Comprehensive metabolic panel     Status: Abnormal   Collection Time: 11/12/23 10:47 AM  Result Value Ref Range   Sodium 142 135 - 145 mmol/L   Potassium 4.2 3.5 - 5.1 mmol/L   Chloride 111 98 - 111 mmol/L   CO2 24 22 - 32 mmol/L   Glucose, Bld 123 (H) 70 - 99 mg/dL    Comment: Glucose reference range applies only to samples taken after fasting for at least 8 hours.   BUN 11 8 - 23 mg/dL   Creatinine, Ser 4.69 0.61 - 1.24 mg/dL   Calcium 9.2 8.9 - 62.9 mg/dL    Total Protein 6.9 6.5 - 8.1 g/dL   Albumin 3.6 3.5 - 5.0 g/dL   AST 28 15 - 41 U/L   ALT 28 0 - 44 U/L   Alkaline Phosphatase 73 38 - 126 U/L   Total Bilirubin 0.7 <1.2 mg/dL   GFR, Estimated >52 >84 mL/min    Comment: (NOTE) Calculated using the CKD-EPI Creatinine Equation (2021)    Anion gap 7 5 -  15    Comment: Performed at [facility], [address], [place], [place] [address]  I-stat chem 8, ED     Status: Abnormal   Collection Time: 11/12/23 10:50 AM  Result Value Ref Range   Sodium 145 135 - 145 mmol/L   Potassium 4.1 3.5 - 5.1 mmol/L   Chloride 108 98 - 111 mmol/L   BUN 12 8 - 23 mg/dL   Creatinine, Ser 6.04 0.61 - 1.24 mg/dL   Glucose, Bld 540 (H) 70 - 99 mg/dL    Comment: Glucose reference range applies only to samples taken after fasting for at least 8 hours.   Calcium, Ion 1.24 1.15 - 1.40 mmol/L   TCO2 24 22 - 32 mmol/L   Hemoglobin 12.9 (L) 13.0 - 17.0 g/dL   HCT 98.1 (L) 19.1 - 47.8 %   CT ANGIO HEAD NECK W WO CM  Result Date: 11/12/2023 CLINICAL DATA:  vertigo, double vision, R facial droop suspected intraparenchymal EXAM: CT ANGIOGRAPHY HEAD AND NECK WITH AND WITHOUT CONTRAST TECHNIQUE: Multidetector CT imaging of the head and neck was performed using the standard protocol during bolus administration of intravenous contrast. Multiplanar CT image reconstructions and MIPs were obtained to evaluate the vascular anatomy. Carotid stenosis measurements (when applicable) are obtained utilizing NASCET criteria, using the distal internal carotid diameter as the denominator. RADIATION DOSE REDUCTION: This exam was performed according to the departmental dose-optimization program which includes automated exposure control, adjustment of the mA and/or kV according to patient size and/or use of iterative reconstruction technique. CONTRAST:  75mL OMNIPAQUE IOHEXOL 350 MG/ML SOLN COMPARISON:  CT chest Kabe Mckoy 4, 2023.  MRI head September 01, 2021. FINDINGS: CT HEAD FINDINGS  Brain: Suspected masses in the left occipital lobe and right posterior fossa, likely metastases. Substantial surrounding edema with mass effect with effacement of the fourth ventricle. No hydrocephalus. No definite superimposed acute large vascular territory infarct. No mass occupying acute hemorrhage. Vascular: No hyperdense vessel identified. Skull: No acute fracture. Sinuses/Orbits: No acute findings. Other: No mastoid effusions. Review of the MIP images confirms the above findings CTA NECK FINDINGS Aortic arch: Aortic atherosclerosis. Great vessel origins are patent. Right carotid system: Atherosclerosis at the carotid bifurcation without greater than 50% stenosis. Left carotid system: Atherosclerosis at the carotid bifurcation without greater than 50% stenosis. Irregularity of the left ICA at the skull base with mildly beaded appearance. Vertebral arteries: Co dominant. No significant (greater than 50%) stenosis. Skeleton: No acute abnormality on limited assessment. Lower cervical degenerative change. Other neck: Approximally 3.1 x 2.5 cm cystic lesion subjacent to the strap musculature and abutting the hyoid and thyroid cartilage. Upper chest: Emphysema. New nodularity and pleural thickening in the left upper lobe, concerning for malignancy. Review of the MIP images confirms the above findings CTA HEAD FINDINGS Anterior circulation: Bilateral intracranial ICAs, MCAs, and ACAs are patent without proximal high-grade stenosis. Posterior circulation: Bilateral intradural vertebral arteries, basilar artery and bilateral posterior cerebral arteries are patent without proximal high-grade stenosis. Venous sinuses: As permitted by contrast timing, patent. Review of the MIP images confirms the above findings IMPRESSION: 1. Suspected masses in the left occipital lobe and right posterior fossa, concerning for metastases given the clinical history. Recommend MRI with contrast to further evaluate. 2. Substantial surrounding  edema with mass effect and effacement of the fourth ventricle. No hydrocephalus at this time. 3. New nodularity and pleural thickening in the left upper lobe, concerning for malignancy. Recommend dedicated CT of the chest with contrast to  fully characterize. 4. No emergent large vessel occlusion or proximal high-grade stenosis. 5. 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 A follow-up CT of the neck with contrast is recommended to fully evaluate and help exclude carcinoma. 6. Mild irregularity of the left ICA at the skull base with beaded appearance. This could be secondary to fibromuscular dysplasia or atherosclerosis. 7. Aortic Atherosclerosis (ICD10-I70.0) and Emphysema (ICD10-J43.9). Urgent findings discussed with Dr. Jarold Motto via telephone at 12:18 p.m. Electronically Signed   By: Feliberto Harts M.D.   On: 11/12/2023 12:28    Pending Labs Unresulted Labs (From admission, onward)     Start     Ordered   11/12/23 1015  Urine rapid drug screen (hosp performed)  Once,   STAT        11/12/23 1016   11/12/23 1015  Urinalysis, Routine w reflex microscopic -Urine, Clean Catch  Once,   URGENT       Question:  Specimen Source  Answer:  Urine, Clean Catch   11/12/23 1016            Vitals/Pain Today's Vitals   11/12/23 0936 11/12/23 0940 11/12/23 1317 11/12/23 1347  BP:  (!) 150/54 (!) 151/51   Pulse:  62 65   Resp:  16 18   Temp:  97.9 F (36.6 C)  98.1 F (36.7 C)  TempSrc:  Oral  Oral  SpO2:  99% 98%   PainSc: 0-No pain       Isolation Precautions No active isolations  Medications Medications  dexamethasone (DECADRON) 4 mg in sodium chloride 0.9 % 50 mL IVPB (has no administration in time range)  metoCLOPramide (REGLAN) injection 10 mg (10 mg Intravenous Given 11/12/23 1037)  diphenhydrAMINE (BENADRYL) injection 12.5 mg (12.5 mg Intravenous Given  11/12/23 1036)  iohexol (OMNIPAQUE) 350 MG/ML injection 75 mL (75 mLs Intravenous Contrast Given 11/12/23 1158)    Mobility walks     Focused Assessments    R Recommendations: See Admitting Provider Note  Report given to:   Additional Notes:

## 2023-11-12 NOTE — ED Notes (Signed)
Got patient on the monitor and got patient a warm blanket

## 2023-11-12 NOTE — ED Triage Notes (Signed)
"  I had went to ED (11-17) and they gave me medicine for my back or kidney's". "The medication (robaxin) has made me feel like I am drunk so I stopped taking it for a while". The double vision started about 2 days ago. "My head is hurting too". Some dizziness too. No chest pain. No sob. \

## 2023-11-12 NOTE — Plan of Care (Signed)

## 2023-11-12 NOTE — ED Triage Notes (Signed)
Coming from urgent care with c/o dizziness. Dizziness has been occurring for 3-4 days now. Last 2-3 days with intermediate dizziness and headaches and double vision.   No stroke signs  12 lead was normal   No changes   162/92 70 98%  130 CBG 98.8

## 2023-11-12 NOTE — ED Notes (Signed)
Patient is being discharged from the Urgent Care and sent to the Emergency Department via EMS . Per Dorann Ou, PA, patient is in need of higher level of care due to new neurological changes, r/o CVA. Patient is aware and verbalizes understanding of plan of care.  Vitals:   11/12/23 0828  BP: (!) 177/64  Pulse: 71  Resp: 18  Temp: 98 F (36.7 C)  SpO2: 98%

## 2023-11-12 NOTE — Hospital Course (Signed)
Juan Norris is a 72 y.o.male with a history of HTN, HLD, HFrEF who was admitted to the [facility] Medicine Teaching Service at [facility] for diplopia and dizziness in setting of suspected brain metastases. His hospital course is detailed below:  Brain Metastasis  Diplopia  Dizziness Worsening diplopia, dizziness, falls x 1 week.  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  Oncology recommended Decadron 4 mg every 8 hours and ***.    Other chronic conditions were medically managed with home medications and formulary alternatives as necessary (HLD, CAD, HTN, HFrEF, tobacco use)  PCP Follow-up Recommendations: Needs outpatient PET/CT scan per oncology

## 2023-11-13 ENCOUNTER — Inpatient Hospital Stay (HOSPITAL_COMMUNITY): Payer: No Typology Code available for payment source

## 2023-11-13 DIAGNOSIS — C7931 Secondary malignant neoplasm of brain: Secondary | ICD-10-CM | POA: Diagnosis not present

## 2023-11-13 DIAGNOSIS — R739 Hyperglycemia, unspecified: Secondary | ICD-10-CM | POA: Insufficient documentation

## 2023-11-13 DIAGNOSIS — C3491 Malignant neoplasm of unspecified part of right bronchus or lung: Secondary | ICD-10-CM | POA: Diagnosis not present

## 2023-11-13 LAB — BASIC METABOLIC PANEL
Anion gap: 4 — ABNORMAL LOW (ref 5–15)
BUN: 18 mg/dL (ref 8–23)
CO2: 22 mmol/L (ref 22–32)
Calcium: 9 mg/dL (ref 8.9–10.3)
Chloride: 114 mmol/L — ABNORMAL HIGH (ref 98–111)
Creatinine, Ser: 1.04 mg/dL (ref 0.61–1.24)
GFR, Estimated: >60 mL/min (ref >60)
Glucose, Bld: 200 mg/dL — ABNORMAL HIGH (ref 70–99)
Potassium: 4.1 mmol/L (ref 3.5–5.1)
Sodium: 140 mmol/L (ref 135–145)

## 2023-11-13 LAB — CBC
HCT: 36.3 % — ABNORMAL LOW (ref 39.0–52.0)
Hemoglobin: 12.1 g/dL — ABNORMAL LOW (ref 13.0–17.0)
MCH: 29.2 pg (ref 26.0–34.0)
MCHC: 33.3 g/dL (ref 30.0–36.0)
MCV: 87.7 fL (ref 80.0–100.0)
Platelets: 191 10*3/uL (ref 150–400)
RBC: 4.14 MIL/uL — ABNORMAL LOW (ref 4.22–5.81)
RDW: 13.2 % (ref 11.5–15.5)
WBC: 5.4 10*3/uL (ref 4.0–10.5)
nRBC: 0 % (ref 0.0–0.2)

## 2023-11-13 LAB — GLUCOSE, CAPILLARY: Glucose-Capillary: 171 mg/dL — ABNORMAL HIGH (ref 70–99)

## 2023-11-13 MED ORDER — IOHEXOL 350 MG/ML SOLN
75.0000 mL | Freq: Once | INTRAVENOUS | Status: AC | PRN
  Administered 2023-11-13: 75 mL via INTRAVENOUS

## 2023-11-13 MED ORDER — DEXAMETHASONE SODIUM PHOSPHATE 4 MG/ML IJ SOLN
4.0000 mg | Freq: Three times a day (TID) | INTRAMUSCULAR | Status: DC
  Administered 2023-11-13 – 2023-11-15 (×6): 4 mg via INTRAVENOUS
  Filled 2023-11-13 (×6): qty 1

## 2023-11-13 MED ORDER — ORAL CARE MOUTH RINSE: 15.0000 mL | OROMUCOSAL | Status: DC | PRN

## 2023-11-13 NOTE — Consult Note (Addendum)
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  HISTORY OF PRESENT ILLNESS  Juan Norris is a 72 y.o. 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 Roselind Messier, last seen by Dr. Roselind Messier in April 2023, presented to the ED on 11/03/2023 with complaints of double vision and dizziness and falls for the last 1 week.  In the ED, CT angiogram of head and neck showed suspected masses in the left occipital lobe and right posterior fossa, concerning for metastatic disease.  Substantial surrounding edema with mass effect and effacement of the fourth ventricle.  No hydrocephalus at this time.  New nodularity and pleural thickening in the left upper lobe, concerning for malignancy.  Recommended dedicated CT of the chest with contrast for further characterization.  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  A follow-up CT of the neck with contrast was recommended to fully evaluate and treat to help exclude carcinoma.  With these findings, he was admitted for further evaluation and neurosurgery and our service was consulted for  additional recommendations.  Unfortunately patient is not a candidate for MRI imaging because of AICD that is not MRI conditional.  Patient seen and evaluated.  His family members were by the bedside, including his daughter.  He denies any new complaints compared to hospitalization.  Denies any nausea or vomiting currently.  MEDICAL HISTORY Past Medical History:  Diagnosis Date   Acute renal insufficiency    Anginal pain (HCC)    Chronic lower back pain    "due to 3 ruptured discs in my back" (11/08/2018)   High cholesterol    History of kidney stones 1982   "passed them"   History of radiation therapy    Right lung- 11/03/21-11/14/21- Dr. Joylene Draft   Hypertension    Lung cancer [facility])    Myocardial infarction (HCC) 03/2018   Systolic heart failure (HCC)    Tobacco use      SURGICAL HISTORY Past Surgical History:  Procedure Laterality Date   BIV ICD INSERTION CRT-D N/A 11/08/2018    [employer] biventricular ICD implantation by Dr Johney Frame with His Bundle pacing and a MDT 3830 lead placed into the LV port.    BRONCHIAL BIOPSY  10/13/2021   Procedure: BRONCHIAL BIOPSIES;  Surgeon: Leslye Peer, MD;  Location: [facility] ENDOSCOPY;  Service: Pulmonary;;   BRONCHIAL BRUSHINGS  10/13/2021   Procedure: BRONCHIAL BRUSHINGS;  Surgeon: Leslye Peer, MD;  Location: [facility] ENDOSCOPY;  Service: Pulmonary;;   BRONCHIAL NEEDLE ASPIRATION BIOPSY  10/13/2021   Procedure: BRONCHIAL NEEDLE ASPIRATION BIOPSIES;  Surgeon: Leslye Peer, MD;  Location: [facility] ENDOSCOPY;  Service: Pulmonary;;   BRONCHIAL WASHINGS  10/13/2021   Procedure: BRONCHIAL  WASHINGS;  Surgeon: Leslye Peer, MD;  Location: [facility] ENDOSCOPY;  Service: Pulmonary;;   CARDIAC CATHETERIZATION     FIDUCIAL MARKER PLACEMENT  10/13/2021   Procedure: FIDUCIAL MARKER PLACEMENT;  Surgeon: Leslye Peer, MD;  Location: [facility] ENDOSCOPY;  Service: Pulmonary;;   INGUINAL HERNIA REPAIR Bilateral    KNEE SURGERY Left 1990s   "growth on the back of  my knee removed"   LEFT HEART CATH AND CORONARY ANGIOGRAPHY N/A 03/30/2018   Procedure: LEFT HEART CATH AND CORONARY ANGIOGRAPHY;  Surgeon: Kathleene Hazel, MD;  Location: [facility] INVASIVE CV LAB;  Service: Cardiovascular;  Laterality: N/A;   VIDEO BRONCHOSCOPY WITH ENDOBRONCHIAL NAVIGATION N/A 10/13/2021   Procedure: ROBOTIC VIDEO BRONCHOSCOPY WITH ENDOBRONCHIAL NAVIGATION;  Surgeon: Leslye Peer, MD;  Location: [facility] ENDOSCOPY;  Service: Pulmonary;  Laterality: N/A;   VIDEO BRONCHOSCOPY WITH RADIAL ENDOBRONCHIAL ULTRASOUND  10/13/2021   Procedure: RADIAL ENDOBRONCHIAL ULTRASOUND;  Surgeon: Leslye Peer, MD;  Location: [facility] ENDOSCOPY;  Service: Pulmonary;;     ALLERGIES  No Known Allergies  FAMILY HISTORY  Family History  Problem Relation Age of Onset   Leukemia Mother    Cancer Father    Cancer Sister    Cancer Brother    CAD Brother      SOCIAL HISTORY   Social History   Socioeconomic History   Marital status: Divorced    Spouse name: Not on file   Number of children: 1   Years of education: Not on file   Highest education level: Not on file  Occupational History   Not on file  Tobacco Use   Smoking status: Every Day    Current packs/day: 1.50    Average packs/day: 1.5 packs/day for 47.0 years (70.5 ttl pk-yrs)    Types: Cigarettes   Smokeless tobacco: Never   Tobacco comments:    1 cigarette smoked daily 05/28/22 [facility]   Vaping Use   Vaping status: Never Used  Substance and Sexual Activity   Alcohol use: Not Currently    Comment: 11/08/2018 "stopped 09/23/1998"   Drug use: Not Currently    Comment: 11/08/2018 "nothing in the 2000s"   Sexual activity: Yes  Other Topics Concern   Not on file  Social History Narrative   Retired   International aid/development worker of [employer] Strain: Not on file  Food Insecurity: No Food Insecurity (11/12/2023)   Hunger Vital Sign    Worried About Running Out of Food in the Last Year: Never true    Ran Out of Food in  the Last Year: Never true  Transportation Needs: No Transportation Needs (11/12/2023)   PRAPARE - Administrator, Civil Service (Medical): No    Lack of Transportation (Non-Medical): No  Physical Activity: Not on file  Stress: Not on file  Social Connections: Not on file  Intimate Partner Violence: Not At Risk (11/12/2023)   Humiliation, Afraid, Rape, and Kick questionnaire    Fear of Current or Ex-Partner: No    Emotionally Abused: No    Physically Abused: No    Sexually Abused: No    CURRENT MEDICATIONS   Current Outpatient Medications  Medication Instructions   aspirin EC 81 mg, Oral, Daily   atorvastatin (LIPITOR) 40 mg, Oral, Daily at bedtime   carvedilol (COREG) 12.5 mg, Oral, Daily   furosemide (LASIX) 20 mg, Oral, Daily, As directed   Multiple Vitamin (MULTIVITAMIN WITH MINERALS) TABS tablet 1 tablet, Oral, Daily   nicotine (NICODERM CQ - DOSED  IN MG/24 HOURS) 21 mg, Transdermal, Daily, Last patch was applied 3-4 days ago, no patch is on today 11/12/2023.   sacubitril-valsartan (ENTRESTO) 24-26 MG 1 tablet, Oral, 2 times daily, Please make overdue appt with Dr. Delton See before anymore refills. 1st attempt   spironolactone (ALDACTONE) 12.5 mg, Oral, Daily     REVIEW OF SYSTEMS   Review of Systems - Oncology  All other pertinent review of systems is negative except as mentioned above in HPI  PHYSICAL EXAMINATION  ECOG PERFORMANCE STATUS: 1 - Symptomatic but completely ambulatory  Vitals:   11/13/23 0444 11/13/23 0851  BP: (!) 125/49 (!) 140/53  Pulse: 63 64  Resp: 17 17  Temp: 98 F (36.7 C) 98.3 F (36.8 C)  SpO2: 96% 96%   Filed Weights   11/12/23 1530  Weight: 147 lb 11.3 oz (67 kg)    Physical Exam Constitutional:      General: He is not in acute distress.    Appearance: Normal appearance.  HENT:     Head: Normocephalic and atraumatic.  Eyes:     General: No scleral icterus.    Conjunctiva/sclera: Conjunctivae normal.  Cardiovascular:      Rate and Rhythm: Normal rate and regular rhythm.     Heart sounds: Normal heart sounds.  Pulmonary:     Effort: Pulmonary effort is normal.     Breath sounds: Normal breath sounds.  Abdominal:     General: There is no distension.  Musculoskeletal:     Right lower leg: No edema.     Left lower leg: No edema.  Neurological:     General: No focal deficit present.     Mental Status: He is alert and oriented to person, place, and time.  Psychiatric:        Mood and Affect: Mood normal.        Behavior: Behavior normal.        Thought Content: Thought content normal.     LABORATORY DATA:   I have reviewed the data as listed  Results for orders placed or performed during the hospital encounter of 11/12/23 (from the past 24 hour(s))  Basic metabolic panel   Collection Time: 11/13/23  4:38 AM  Result Value Ref Range   Sodium 140 135 - 145 mmol/L   Potassium 4.1 3.5 - 5.1 mmol/L   Chloride 114 (H) 98 - 111 mmol/L   CO2 22 22 - 32 mmol/L   Glucose, Bld 200 (H) 70 - 99 mg/dL   BUN 18 8 - 23 mg/dL   Creatinine, Ser 4.13 0.61 - 1.24 mg/dL   Calcium 9.0 8.9 - 24.4 mg/dL   GFR, Estimated >01 >02 mL/min   Anion gap 4 (L) 5 - 15  CBC   Collection Time: 11/13/23  4:38 AM  Result Value Ref Range   WBC 5.4 4.0 - 10.5 K/uL   RBC 4.14 (L) 4.22 - 5.81 MIL/uL   Hemoglobin 12.1 (L) 13.0 - 17.0 g/dL   HCT 72.5 (L) 36.6 - 44.0 %   MCV 87.7 80.0 - 100.0 fL   MCH 29.2 26.0 - 34.0 pg   MCHC 33.3 30.0 - 36.0 g/dL   RDW 34.7 42.5 - 95.6 %   Platelets 191 150 - 400 K/uL   nRBC 0.0 0.0 - 0.2 %      RADIOGRAPHIC STUDIES:  I have personally reviewed the radiological images as listed and agree with the findings in the report.  CT ANGIO HEAD NECK W WO CM  Result Date: 11/12/2023 CLINICAL DATA:  vertigo, double vision, R facial droop suspected intraparenchymal EXAM: CT ANGIOGRAPHY HEAD AND NECK WITH AND WITHOUT CONTRAST TECHNIQUE: Multidetector CT imaging of the head and neck was performed  using the standard protocol during bolus administration of intravenous contrast. Multiplanar CT image reconstructions and MIPs were obtained to evaluate the vascular anatomy. Carotid stenosis measurements (when applicable) are obtained utilizing NASCET criteria, using the distal internal carotid diameter as the denominator. RADIATION DOSE REDUCTION: This exam was performed according to the departmental dose-optimization program which includes automated exposure control, adjustment of the mA and/or kV according to patient size and/or use of iterative reconstruction technique. CONTRAST:  75mL OMNIPAQUE IOHEXOL 350 MG/ML SOLN COMPARISON:  CT chest March 17, 2022.  MRI head September 01, 2021. FINDINGS: CT HEAD FINDINGS Brain: Suspected masses in the left occipital lobe and right posterior fossa, likely metastases. Substantial surrounding edema with mass effect with effacement of the fourth ventricle. No hydrocephalus. No definite superimposed acute large vascular territory infarct. No mass occupying acute hemorrhage. Vascular: No hyperdense vessel identified. Skull: No acute fracture. Sinuses/Orbits: No acute findings. Other: No mastoid effusions. Review of the MIP images confirms the above findings CTA NECK FINDINGS Aortic arch: Aortic atherosclerosis. Great vessel origins are patent. Right carotid system: Atherosclerosis at the carotid bifurcation without greater than 50% stenosis. Left carotid system: Atherosclerosis at the carotid bifurcation without greater than 50% stenosis. Irregularity of the left ICA at the skull base with mildly beaded appearance. Vertebral arteries: Co dominant. No significant (greater than 50%) stenosis. Skeleton: No acute abnormality on limited assessment. Lower cervical degenerative change. Other neck: Approximally 3.1 x 2.5 cm cystic lesion subjacent to the strap musculature and abutting the hyoid and thyroid cartilage. Upper chest: Emphysema. New nodularity and pleural thickening in the  left upper lobe, concerning for malignancy. Review of the MIP images confirms the above findings CTA HEAD FINDINGS Anterior circulation: Bilateral intracranial ICAs, MCAs, and ACAs are patent without proximal high-grade stenosis. Posterior circulation: Bilateral intradural vertebral arteries, basilar artery and bilateral posterior cerebral arteries are patent without proximal high-grade stenosis. Venous sinuses: As permitted by contrast timing, patent. Review of the MIP images confirms the above findings IMPRESSION: 1. Suspected masses in the left occipital lobe and right posterior fossa, concerning for metastases given the clinical history. Recommend MRI with contrast to further evaluate. 2. Substantial surrounding edema with mass effect and effacement of the fourth ventricle. No hydrocephalus at this time. 3. New nodularity and pleural thickening in the left upper lobe, concerning for malignancy. Recommend dedicated CT of the chest with contrast to fully characterize. 4. No emergent large vessel occlusion or proximal high-grade stenosis. 5. 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 A follow-up CT of the neck with contrast is recommended to fully evaluate and help exclude carcinoma. 6. Mild irregularity of the left ICA at the skull base with beaded appearance. This could be secondary to fibromuscular dysplasia or atherosclerosis. 7. Aortic Atherosclerosis (ICD10-I70.0) and Emphysema (ICD10-J43.9). Urgent findings discussed with Dr. Jarold Motto via telephone at 12:18 p.m. Electronically Signed   By: Feliberto Harts M.D.   On: 11/12/2023 12:28   CUP PACEART REMOTE DEVICE CHECK  Result Date: 11/10/2023 Scheduled remote reviewed. Normal device function.  Next remote 91 days. LA, CVRS  DG Lumbar Spine Complete  Result Date: 11/01/2023 CLINICAL DATA:  Pain EXAM: LUMBAR SPINE -  COMPLETE 4+  VIEW COMPARISON:  08/21/2020 FINDINGS: Normal alignment. No fracture. Disc spaces maintained. Anterior degenerative spurring. Degenerative facet disease throughout the lumbar spine. No acute bony abnormality. IMPRESSION: No acute bony abnormality. Electronically Signed   By: Charlett Nose M.D.   On: 11/01/2023 03:39    ASSESSMENT & PLAN:   72 y.o. 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 Roselind Messier, last seen by Dr. Roselind Messier in April 2023, presented to the ED on 11/03/2023 with complaints of double vision and dizziness and falls for the last 1 week.  CT head showed left occipital lobe mass and right posterior fossa mass consistent with metastatic disease.  MRI of the brain could not be obtained because of AICD which is not compatible with MRI.  Recommend metastatic workup with CT chest abdomen pelvis with IV contrast.  Once CT is resulted, we can determine biopsy of the most accessible lesion.  If no other evidence of disease is noted, then resection of the intracranial metastatic disease would be recommended to obtain tissue confirmation.  He will need PET/CT in outpatient setting.  Continue Decadron 4 mg every 8 hours.  Input from neurosurgery department reviewed and noted.  Radiation oncology will need to be consulted on Monday, 11/15/2023 for further evaluation and management.  Today I discussed tentative diagnosis, prognosis, staging, plan of care, treatment options.  If confirmed to be metastatic disease from lung primary, updated staging would be stage IV.  All treatment options will be palliative in nature and not curative in intent and this was explained to the patient and his family members and they verbalized understanding.    Eventually, he will need systemic treatments with chemoimmunotherapy, which will be planned  in the outpatient setting, pending pathology results.  Patient apparently has been following up with Dr. Delton See at [facility] for oncology follow-up.  Last imaging was approximately 6 months ago and per patient's verbal report, he was in remission at that time.  We will try to obtain more records from Dr. Lindaann Slough office on Monday.  Rest of care as per primary team and other specialties.  Thanks for the opportunity to participate in the care of this patient. Please contact me if there are any questions.  Upon discharge, please make appointment for patient to follow up with me at [facility] cancer center.   Meryl Crutch, MD Medical Oncology and Hematology 11/13/2023 12:19 PM    This document was completed utilizing speech recognition software. Grammatical errors, random word insertions, pronoun errors, and incomplete sentences are an occasional consequence of this system due to software limitations, ambient noise, and hardware issues. Any formal questions or concerns about the content, text or information contained within the body of this dictation should be directly addressed to the provider for clarification.

## 2023-11-13 NOTE — Plan of Care (Signed)
FMTS Interim Progress Note  S:Received secure chat from RN that pt fell in his room after arguing with family. Went to evaluate at bedside. Pt did not hit his head. States he slipped getting out of bed and caught himself with his hands. He is not concerned about the fall, states it was mechanical and he has no pain anywhere.  Daughter at bedside requesting anxiety medications for pt. Pt declines anxiety meds, states he will let us know if he needs anything.   O: BP (!) 171/68 (BP Location: Left Arm)   Pulse 80   Temp 97.8 F (36.6 C) (Oral)   Resp 20   Ht 5\' 6"  (1.676 m)   Wt 67 kg   SpO2 100%   BMI 23.84 kg/m   Gen: well appearing, no acute distress  A/P: - CTM for any persistent headaches or new neurological sx - Fall precautions - Pt will try to get some rest  Dunya Meiners, DO 11/13/2023, 6:33 PM PGY-1, [facility] Family Medicine Service pager [phone]

## 2023-11-13 NOTE — Assessment & Plan Note (Addendum)
Dizziness, diplopia, and falls thought to be in the setting of suspected brain metastases found on CTA head. Has history of squamous cell carcinoma of RLL. Patient not a candidate for MRI brain imaging given ICD.  Oncology to see this morning. - Tylenol 1000 q6 PRN for headache - PT/OT eval and treat - Falls precautions - CBC, BMP  - Consider CT A/P for further assessment - Greatly appreciate Oncology assistance, they will follow up with patient in the AM and will follow up their further recommendations:  - Continue decadron 4 mg Q8h  - Consider involving Rad Onc later

## 2023-11-13 NOTE — Assessment & Plan Note (Signed)
Glucose 200 on BMP this morning. No hx diabetes. Plan to start decadron 4mg  q8h per oncology today, this will likely increase glucose even more - CBG BID - A1C

## 2023-11-13 NOTE — Progress Notes (Signed)
Daily Progress Note Intern Pager: [phone]  Patient name: Juan Norris Medical record number: [id number] Date of birth: 01-17-1951 Age: 72 y.o. Gender: male  Primary Care Provider: Clinic, [facility] Consultants: Oncology, radiation oncology, neurosurgery Code Status: DNR  Pt Overview and Major Events to Date:  11/29 - admitted  Assessment and Plan:  72 year old with PMHx SCC of RLL (reported remission as of 2022), MI 2018, HLD, CAD, HTN, HFrEF, tobacco use presenting with diplopia and dizziness, found to have multiple brain masses on CT imaging. Assessment & Plan Metastatic cancer (HCC) Dizziness, diplopia, and falls thought to be in the setting of suspected brain metastases found on CTA head. Has history of squamous cell carcinoma of RLL. Patient not a candidate for MRI brain imaging given ICD.  Oncology to see this morning. - Tylenol 1000 q6 PRN for headache - PT/OT eval and treat - Falls precautions - CBC, BMP  - Consider CT A/P for further assessment - Greatly appreciate Oncology assistance, they will follow up with patient in the AM and will follow up their further recommendations:  - Continue decadron 4 mg Q8h  - Consider involving Rad Onc later Elevated blood sugar Glucose 200 on BMP this morning. No hx diabetes. Plan to start decadron 4mg  q8h per oncology today, this will likely increase glucose even more - CBG BID - A1C   Chronic and Stable Problems:  HLD: continue home atorvastatin 40 daily Coronary disease: continue home aspirin 81 daily HTN: continue home Entresto 24-26 BID, home spironolactone 12.5 daily  HFrEF: continue home carvedilol 25 daily and anti-hypertensives per above Tobacco use: continue home nicotine patch daily   FEN/GI: regular PPx: lovenox Dispo:Home pending clinical improvement   Subjective:  No acute events overnight. No concerns or complaints today.   Objective: Temp:  [97.8 F (36.6 C)-98.6 F (37 C)] 98.3 F (36.8 C)  (11/30 0851) Pulse Rate:  [61-65] 64 (11/30 0851) Resp:  [17-18] 17 (11/30 0851) BP: (122-156)/(43-53) 140/53 (11/30 0851) SpO2:  [96 %-98 %] 96 % (11/30 0851) Weight:  [67 kg] 67 kg (11/29 1530) Physical Exam: General: well appearing, no acute distress Cardiovascular: RRR, no murmurs Respiratory: CTAB, no increased WOB on RA Extremities: no leg swelling BLE Neuro: Reports diplopia when both eyes are open but not when he closes one eye at a time.PERRLA. EOMI. Sensation intact BL. Facial expressions symmetric. Hearing intact BL. regular speech. Normal finger to nose testing bilaterally.   Laboratory: Most recent CBC Lab Results  Component Value Date   WBC 5.4 11/13/2023   HGB 12.1 (L) 11/13/2023   HCT 36.3 (L) 11/13/2023   MCV 87.7 11/13/2023   PLT 191 11/13/2023   Most recent BMP    Latest Ref Rng & Units 11/13/2023    4:38 AM  BMP  Glucose 70 - 99 mg/dL 010   BUN 8 - 23 mg/dL 18   Creatinine 2.72 - 1.24 mg/dL 5.36   Sodium 644 - 034 mmol/L 140   Potassium 3.5 - 5.1 mmol/L 4.1   Chloride 98 - 111 mmol/L 114   CO2 22 - 32 mmol/L 22   Calcium 8.9 - 10.3 mg/dL 9.0    Imaging/Diagnostic Tests: CTA Head Neck W/WO 11/12/23 IMPRESSION: 1. Suspected masses in the left occipital lobe and right posterior fossa, concerning for metastases given the clinical history. Recommend MRI with contrast to further evaluate. 2. Substantial surrounding edema with mass effect and effacement of the fourth ventricle. No hydrocephalus at this time. 3. New  nodularity and pleural thickening in the left upper lobe, concerning for malignancy. Recommend dedicated CT of the chest with contrast to fully characterize. 4. No emergent large vessel occlusion or proximal high-grade stenosis. 5. 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 A follow-up CT of the neck with  contrast is recommended to fully evaluate and help exclude carcinoma. 6. Mild irregularity of the left ICA at the skull base with beaded appearance. This could be secondary to fibromuscular dysplasia or atherosclerosis. 7. Aortic Atherosclerosis (ICD10-I70.0) and Emphysema (ICD10-J43.9).  Cj Edgell, DO 11/13/2023, 10:35 AM  PGY-1, [facility] FPTS Intern pager: [phone], text pages welcome Secure chat group [facility] Teaching Service

## 2023-11-13 NOTE — Evaluation (Signed)
Occupational Therapy Evaluation Patient Details Name: Juan Norris MRN: [id number] DOB: [birth year] Today's Date: 11/13/2023   History of Present Illness 72 year old  presenting with diplopia and dizziness, found to have multiple brain masses on CT imaging.  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   Clinical Impression   Pt currently with dizziness and diplopia present in both the right and left visual fields with near and far vision.  He was able to report fusion of vision so that one target was seen at different intervals including midline at eye level and the far right and left visual fields at eye level, but this was not always consistent.  Increased diplopia in all areas during mobility in the hallway which was completed at min guard assist along with simulated selfcare tasks sit to stand.  Provided eye patch to assist with diplopia compensation during activities and feel pt will benefit from acute care OT to continue working on vision exercises and compensatory strategies along with increasing overall ADL independence.  Recommend follow-up outpatient OT if pt can get transportation.  If not recommend HHOT eval for safety.  Will update discharge recommendations as well follow if needs change.        If plan is discharge home, recommend the following: Assist for transportation    Functional Status Assessment  Patient has had a recent decline in their functional status and demonstrates the ability to make significant improvements in function in a reasonable and predictable amount of time.  Equipment Recommendations  Other (comment) (TBD next visit)       Precautions / Restrictions Precautions Precautions: Fall Precaution Comments: diplopia, has patch Restrictions Weight Bearing Restrictions: No      Mobility Bed Mobility Overal bed mobility: Modified Independent                  Transfers Overall transfer level: Needs  assistance Equipment used: None Transfers: Sit to/from Stand, Bed to chair/wheelchair/BSC Sit to Stand: Contact guard assist     Step pivot transfers: Contact guard assist     General transfer comment: Contact guard assist for functional mobility without assistive device.      Balance Overall balance assessment: Needs assistance Sitting-balance support: Feet supported, No upper extremity supported Sitting balance-Leahy Scale: Good     Standing balance support: No upper extremity supported, During functional activity Standing balance-Leahy Scale: Fair                             ADL either performed or assessed with clinical judgement   ADL Overall ADL's : Needs assistance/impaired Eating/Feeding: Independent;Sitting   Grooming: Wash/dry hands;Standing;Contact guard assist   Upper Body Bathing: Set up;Sitting   Lower Body Bathing: Contact guard assist;Sit to/from stand   Upper Body Dressing : Set up;Sitting   Lower Body Dressing: Contact guard assist;Sit to/from stand   Toilet Transfer: Contact guard assist;Ambulation;Regular Toilet   Toileting- Clothing Manipulation and Hygiene: Contact guard assist;Sit to/from stand       Functional mobility during ADLs: Contact guard assist (ambulation without assistive device) General ADL Comments: Pt with diplopia in most visual fields with some intermittent fusion noted at midline at about eye level and when scanning his room.  With mobility, he reported diplopia throughout and frequently closes his left eye to help with this.  Eye patch provided for him to wear over the non-dominant left eye for periods of  time (no greater than 2hrs per interval) to assist with ambulation and with functional tasks when diplopia is present.  Also, provided visual tracking exercises handout and reviewed.     Vision Baseline Vision/History: 1 Wears glasses Ability to See in Adequate Light: 0 Adequate Patient Visual Report:  Diplopia Vision Assessment?: Yes Eye Alignment: Within Functional Limits Ocular Range of Motion: Within Functional Limits Alignment/Gaze Preference: Within Defined Limits Tracking/Visual Pursuits: Other (comment) (Tracking smooth overall but increased pain with far lateral gaze holding to both sides.) Saccades: Additional head turns occurred during testing;Impaired - to be further tested in functional context Convergence: Impaired (comment) Visual Fields: No apparent deficits Diplopia Assessment: Objects split on top of one another Additional Comments: Pt with intermittent diplopia in all fields with near vision and far vision.  He was able to exhibit some fusion at midline just below eye level and to the far lateral sides at eye level.  Most other areas he would report diplopia with object being on top of the other.     Perception Perception: Within Functional Limits       Praxis Praxis: WFL       Pertinent Vitals/Pain Pain Assessment Pain Assessment: Faces Faces Pain Scale: Hurts a little bit Pain Location: Head pain Pain Descriptors / Indicators: Aching, Discomfort Pain Intervention(s): Limited activity within patient's tolerance, Monitored during session     Extremity/Trunk Assessment Upper Extremity Assessment Upper Extremity Assessment: Overall WFL for tasks assessed   Lower Extremity Assessment Lower Extremity Assessment: Defer to PT evaluation   Cervical / Trunk Assessment Cervical / Trunk Assessment: Normal   Communication Communication Communication: No apparent difficulties   Cognition Arousal: Alert Behavior During Therapy: WFL for tasks assessed/performed Overall Cognitive Status: Within Functional Limits for tasks assessed                                                  Home Living Family/patient expects to be discharged to:: Private residence Living Arrangements: Other (Comment) (grandson and his girlfriend) Available Help at  Discharge: Family;Available PRN/intermittently Type of Home: House Home Access: Stairs to enter [employer] of Steps: 1   Home Layout: One level     Bathroom Shower/Tub: Chief Strategy Officer: Standard (in his room)     Home Equipment: None          Prior Functioning/Environment Prior Level of Function : Independent/Modified Independent                        OT Problem List: Impaired vision/perception;Impaired balance (sitting and/or standing);Decreased knowledge of use of DME or AE      OT Treatment/Interventions: Self-care/ADL training;DME and/or AE instruction;Patient/family education;Neuromuscular education;Balance training;Other (comment) (vision exercises)    OT Goals(Current goals can be found in the care plan section) Acute Rehab OT Goals Patient Stated Goal: Pt wants his vision to get better OT Goal Formulation: With patient Time For Goal Achievement: 11/27/23 Potential to Achieve Goals: Good  OT Frequency: Min 1X/week       AM-PAC OT "6 Clicks" Daily Activity     Outcome Measure Help from another person eating meals?: None Help from another person taking care of personal grooming?: A Little Help from another person toileting, which includes using toliet, bedpan, or urinal?: A Little Help from another person bathing (including washing, rinsing,  drying)?: A Little Help from another person to put on and taking off regular upper body clothing?: A Little Help from another person to put on and taking off regular lower body clothing?: A Little 6 Click Score: 19   End of Session Equipment Utilized During Treatment: Gait belt Nurse Communication: Mobility status  Activity Tolerance: Patient tolerated treatment well Patient left: in bed;with call bell/phone within reach;with family/visitor present  OT Visit Diagnosis: Unsteadiness on feet (R26.81);Other (comment) (visual disturbance)                Time: 0930-1003 OT Time  Calculation (min): 33 min Charges:  OT General Charges $OT Visit: 1 Visit OT Evaluation $OT Eval Moderate Complexity: 1 Mod OT Treatments $Therapeutic Activity: 8-22 mins  Perrin Maltese, OTR/L Acute Rehabilitation Services  Office [phone] 11/13/2023

## 2023-11-13 NOTE — Evaluation (Addendum)
Physical Therapy Evaluation Patient Details Name: Juan Norris MRN: [id number] DOB: 05/12/1951 Today's Date: 11/13/2023  History of Present Illness  72 year old  presenting with diplopia and dizziness, found to have multiple brain masses on CT imaging.  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  Clinical Impression  Pt presents with admitting diagnosis above. Pt today was able to ambulate in hallway with supervision no AD. Pt noted to ambulate much faster with eye patch that OT provided. Pt reports PTA he was fully independent with no AD. Recommend OPPT upon DC if pt can get transportation, if not then HHPT for home safety eval. PT will continue to follow.       If plan is discharge home, recommend the following: A little help with walking and/or transfers;Help with stairs or ramp for entrance;Assist for transportation   Can travel by private vehicle        Equipment Recommendations None recommended by PT  Recommendations for Other Services       Functional Status Assessment Patient has had a recent decline in their functional status and demonstrates the ability to make significant improvements in function in a reasonable and predictable amount of time.     Precautions / Restrictions Precautions Precautions: Fall Precaution Comments: diplopia, has patch Restrictions Weight Bearing Restrictions: No      Mobility  Bed Mobility Overal bed mobility: Modified Independent                  Transfers Overall transfer level: Independent Equipment used: None Transfers: Sit to/from Stand Sit to Stand: Independent           General transfer comment: Pt immediately stood up upon this PTs arrival.    Ambulation/Gait Ambulation/Gait assistance: Supervision Gait Distance (Feet): 350 Feet Assistive device: None Gait Pattern/deviations: Drifts right/left, Decreased stride length, Step-through pattern Gait velocity:  decreased     General Gait Details: pt initially with slowed cautious gait. Pt noted to ambulate much faster with eye patch. no LOB noted.  Stairs            Wheelchair Mobility     Tilt Bed    Modified Rankin (Stroke Patients Only)       Balance Overall balance assessment: Needs assistance Sitting-balance support: Feet supported, No upper extremity supported Sitting balance-Leahy Scale: Good     Standing balance support: No upper extremity supported, During functional activity Standing balance-Leahy Scale: Fair                               Pertinent Vitals/Pain Pain Assessment Pain Assessment: Faces Faces Pain Scale: Hurts a little bit Pain Location: Head pain Pain Descriptors / Indicators: Aching, Discomfort    Home Living Family/patient expects to be discharged to:: Private residence Living Arrangements: Other (Comment) (grandson and his girlfriend) Available Help at Discharge: Family;Available PRN/intermittently Type of Home: House Home Access: Stairs to enter   [employer] of Steps: 1   Home Layout: One level Home Equipment: None      Prior Function Prior Level of Function : Independent/Modified Independent                     Extremity/Trunk Assessment   Upper Extremity Assessment Upper Extremity Assessment: Overall WFL for tasks assessed    Lower Extremity Assessment Lower Extremity Assessment: Overall WFL for tasks assessed    Cervical / Trunk  Assessment Cervical / Trunk Assessment: Normal  Communication   Communication Communication: No apparent difficulties  Cognition Arousal: Alert Behavior During Therapy: WFL for tasks assessed/performed Overall Cognitive Status: Within Functional Limits for tasks assessed                                          General Comments General comments (skin integrity, edema, etc.): VSS    Exercises     Assessment/Plan    PT Assessment Patient  needs continued PT services  PT Problem List Decreased strength;Decreased range of motion;Decreased activity tolerance;Decreased balance;Decreased mobility;Decreased coordination;Decreased knowledge of use of DME;Decreased safety awareness;Decreased knowledge of precautions       PT Treatment Interventions DME instruction;Gait training;Stair training;Functional mobility training;Therapeutic activities;Therapeutic exercise;Balance training;Neuromuscular re-education;Patient/family education    PT Goals (Current goals can be found in the Care Plan section)  Acute Rehab PT Goals Patient Stated Goal: to go home PT Goal Formulation: With patient Time For Goal Achievement: 11/27/23 Potential to Achieve Goals: Good    Frequency Min 1X/week     Co-evaluation               AM-PAC PT "6 Clicks" Mobility  Outcome Measure Help needed turning from your back to your side while in a flat bed without using bedrails?: None Help needed moving from lying on your back to sitting on the side of a flat bed without using bedrails?: None Help needed moving to and from a bed to a chair (including a wheelchair)?: None Help needed standing up from a chair using your arms (e.g., wheelchair or bedside chair)?: None Help needed to walk in hospital room?: A Little Help needed climbing 3-5 steps with a railing? : A Little 6 Click Score: 22    End of Session Equipment Utilized During Treatment: Gait belt Activity Tolerance: Patient tolerated treatment well Patient left: in bed;with call bell/phone within reach;with family/visitor present Nurse Communication: Mobility status PT Visit Diagnosis: Other abnormalities of gait and mobility (R26.89)    Time: 0865-7846 PT Time Calculation (min) (ACUTE ONLY): 11 min   Charges:   PT Evaluation $PT Eval Moderate Complexity: 1 Mod   PT General Charges $$ ACUTE PT VISIT: 1 Visit         Shela Nevin, PT, DPT Acute Rehab Services [phone]   [name] 11/13/2023, 3:40 PM

## 2023-11-14 ENCOUNTER — Inpatient Hospital Stay (HOSPITAL_COMMUNITY): Payer: No Typology Code available for payment source

## 2023-11-14 DIAGNOSIS — C7931 Secondary malignant neoplasm of brain: Secondary | ICD-10-CM | POA: Diagnosis not present

## 2023-11-14 LAB — GLUCOSE, CAPILLARY
Glucose-Capillary: 208 mg/dL — ABNORMAL HIGH (ref 70–99)
Glucose-Capillary: 237 mg/dL — ABNORMAL HIGH (ref 70–99)
Glucose-Capillary: 166 mg/dL — ABNORMAL HIGH (ref 70–99)
Glucose-Capillary: 204 mg/dL — ABNORMAL HIGH (ref 70–99)

## 2023-11-14 LAB — HEMOGLOBIN A1C
Hgb A1c MFr Bld: 6.4 % — ABNORMAL HIGH (ref 4.8–5.6)
Mean Plasma Glucose: 136.98 mg/dL

## 2023-11-14 MED ORDER — POLYETHYLENE GLYCOL 3350 17 G PO PACK
17.0000 g | PACK | Freq: Two times a day (BID) | ORAL | Status: DC
  Filled 2023-11-14: qty 1

## 2023-11-14 MED ORDER — INSULIN ASPART 100 UNIT/ML IJ SOLN
0.0000 [IU] | Freq: Three times a day (TID) | INTRAMUSCULAR | Status: DC
Start: 2023-11-14 — End: 2023-11-16
  Administered 2023-11-14: 1 [IU] via SUBCUTANEOUS
  Administered 2023-11-14: 2 [IU] via SUBCUTANEOUS
  Administered 2023-11-15: 1 [IU] via SUBCUTANEOUS
  Administered 2023-11-15 – 2023-11-16 (×3): 2 [IU] via SUBCUTANEOUS

## 2023-11-14 NOTE — Progress Notes (Signed)
[facility]  HEMATOLOGY/ONCOLOGY IN-PATIENT PROGRESS NOTE   PATIENT NAME: Juan Norris   MR#: [id number] DOB: Sep 09, 1951 CSN#: [id number]   DATE OF SERVICE: 11/14/2023  ASSESSMENT & PLAN:   72 y.o. 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 Roselind Messier, last seen by Dr. Roselind Messier in April 2023, presented to the ED on 11/03/2023 with complaints of double vision and dizziness and falls for the last 1 week.   CT head showed left occipital lobe mass and right posterior fossa mass consistent with metastatic disease.   MRI of the brain could not be obtained because of AICD which is not compatible with MRI.  On 11/13/2023, CT chest, abdomen and pelvis showed new irregular peripheral left upper lobe lung nodule measuring 1.3 x 0.8 cm and new subpleural right upper lobe 5 x 2 mm nodule, suspicious for pulmonary neoplasm.  Hepatic segment 4 lesion measuring 2 x 1.8 cm, suspicious for metastatic disease.  IR has been consulted for biopsy of the liver lesion for tissue confirmation.  Please consider Port-A-Cath placement at the same time since patient will be needing systemic chemoimmunotherapy.  Request submitted today.   Radiation oncology will need to be consulted on Monday, 11/15/2023 for further evaluation and management.  Most likely treatments will be initiated by them in the outpatient setting for intracranial lesions, pending biopsy results.   He will need PET/CT in outpatient setting.   Continue Decadron 4 mg every 8 hours.   Input from neurosurgery department reviewed and noted.   Previously I discussed tentative diagnosis, prognosis, staging, plan of care, treatment options.  If confirmed to be metastatic disease from lung primary, updated staging would be stage IV.  All treatment options will be palliative in nature and  not curative in intent and this was explained to the patient and his family members and they verbalized understanding.     Eventually, he will need systemic treatments with chemoimmunotherapy, which will be planned in the outpatient setting, pending pathology results.   Patient apparently has been following up with Dr. Delton See at [facility] for oncology follow-up.  Last imaging was approximately 6 months ago and per patient's verbal report, he was in remission at that time.  We will try to obtain more records from Dr. Lindaann Slough office on Monday.   Rest of care as per primary team and other specialties.   Thanks for the opportunity to participate in the care of this patient. Please contact me if there are any questions.   Upon discharge, please make appointment for patient to follow up with me at [facility].  Discharge planning  He can be discharged after biopsy can be obtained.  We will arrange outpatient follow-up in our clinic and also with radiation oncology.    Meryl Crutch, MD 11/14/2023 1:11 PM  SUBJECTIVE:   Patient seen and evaluated.  Denies any new complaints.  Able to tolerate oral intake well.  Dizziness has improved.  PERTINENT HISTORY:  72 y.o. 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 Roselind Messier, last seen by Dr. Roselind Messier in April 2023, presented to the ED on 11/03/2023 with complaints of double vision  and dizziness and falls for the last 1 week.   In the ED, CT angiogram of head and neck showed suspected masses in the left occipital lobe and right posterior fossa, concerning for metastatic disease.  Substantial surrounding edema with mass effect and effacement of the fourth ventricle.  No hydrocephalus at this time.  New nodularity and pleural thickening in the left upper lobe, concerning for  malignancy.  Recommended dedicated CT of the chest with contrast for further characterization.  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  A follow-up CT of the neck with contrast was recommended to fully evaluate and treat to help exclude carcinoma.   With these findings, he was admitted for further evaluation and neurosurgery and our service was consulted for additional recommendations.   Unfortunately patient is not a candidate for MRI imaging because of AICD that is not MRI conditional.  On 11/13/2023, CT chest, abdomen and pelvis showed new irregular peripheral left upper lobe lung nodule measuring 1.3 x 0.8 cm and new subpleural right upper lobe 5 x 2 mm nodule, suspicious for pulmonary neoplasm.  Hepatic segment 4 lesion measuring 2 x 1.8 cm, suspicious for metastatic disease.  IR has been consulted for biopsy of the liver lesion for tissue confirmation.    Radiation oncology will need to be consulted on Monday, 11/15/2023 for further evaluation and management.  Most likely treatments will be initiated by them in the outpatient setting for intracranial lesions, pending biopsy results.   He will need PET/CT in outpatient setting.   Continue Decadron 4 mg every 8 hours.   Input from neurosurgery department reviewed and noted.   Previously I discussed tentative diagnosis, prognosis, staging, plan of care, treatment options.  If confirmed to be metastatic disease from lung primary, updated staging would be stage IV.  All treatment options will be palliative in nature and not curative in intent and this was explained to the patient and his family members and they verbalized understanding.     Eventually, he will need systemic treatments with chemoimmunotherapy, which will be planned in the outpatient setting, pending pathology results.   Patient apparently has been  following up with Dr. Delton See at [facility] for oncology follow-up.  Last imaging was approximately 6 months ago and per patient's verbal report, he was in remission at that time.  We will try to obtain more records from Dr. Lindaann Slough office on Monday.  OBJECTIVE:  Vitals:   11/14/23 0501 11/14/23 0837  BP: (!) 122/44 (!) 140/56  Pulse: 60 69  Resp: 18 17  Temp: (!) 97.3 F (36.3 C) 97.7 F (36.5 C)  SpO2: 96% 99%    No intake or output data in the 24 hours ending 11/14/23 1311  Physical Exam Constitutional:      General: He is not in acute distress.    Appearance: Normal appearance.  HENT:     Head: Normocephalic and atraumatic.  Eyes:     General: No scleral icterus.    Conjunctiva/sclera: Conjunctivae normal.  Cardiovascular:     Rate and Rhythm: Normal rate and regular rhythm.     Heart sounds: Normal heart sounds.  Pulmonary:     Effort: Pulmonary effort is normal.     Breath sounds: Normal breath sounds.  Abdominal:     General: There is no distension.  Musculoskeletal:     Right lower leg: No edema.  Left lower leg: No edema.  Neurological:     General: No focal deficit present.     Mental Status: He is alert and oriented to person, place, and time.  Psychiatric:        Mood and Affect: Mood normal.        Behavior: Behavior normal.        Thought Content: Thought content normal.     LABS:   Results for orders placed or performed during the hospital encounter of 11/12/23 (from the past 24 hour(s))  Glucose, capillary     Status: Abnormal   Collection Time: 11/13/23  5:55 PM  Result Value Ref Range   Glucose-Capillary 171 (H) 70 - 99 mg/dL  Hemoglobin Z6X     Status: Abnormal   Collection Time: 11/14/23  5:43 AM  Result Value Ref Range   Hgb A1c MFr Bld 6.4 (H) 4.8 - 5.6 %   Mean Plasma Glucose 136.98 mg/dL  Glucose, capillary     Status: Abnormal   Collection Time: 11/14/23  7:32 AM  Result Value Ref Range   Glucose-Capillary 208 (H) 70 - 99 mg/dL   Glucose, capillary     Status: Abnormal   Collection Time: 11/14/23 11:42 AM  Result Value Ref Range   Glucose-Capillary 237 (H) 70 - 99 mg/dL     IMAGING STUDIES:   CT HEAD W & WO CONTRAST ( )  Result Date: 11/14/2023 CLINICAL DATA:  Initial evaluation for brain/CNS neoplasm, monitor. EXAM: CT HEAD WITHOUT AND WITH CONTRAST TECHNIQUE: Contiguous axial images were obtained from the base of the skull through the vertex without and with intravenous contrast. RADIATION DOSE REDUCTION: This exam was performed according to the departmental dose-optimization program which includes automated exposure control, adjustment of the mA and/or kV according to patient size and/or use of iterative reconstruction technique. CONTRAST:  75mL OMNIPAQUE IOHEXOL 350 MG/ML SOLN COMPARISON:  Prior CT from 11/12/2023. FINDINGS: Brain: Partially cystic peripherally enhancing mass centered at the left occipital cortex measures 2.4 x 2.3 x 2.6 cm (series 9, image 15). Surrounding vasogenic edema without significant regional mass effect. Additional partially cystic heterogeneous peripherally enhancing mass positioned at the inferior right cerebellar hemisphere measures 3.5 x 2.3 x 3.2 cm (series 9, image 22). Surrounding edema with regional mass effect within the right posterior fossa. Partial effacement of the fourth ventricle and fourth ventricular outflow tract. No hydrocephalus. Basilar cisterns are mildly crowded but remain patent. These lesions demonstrate some intrinsic hyperdensity, which could reflect blood products. 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 No other visible mass lesion or abnormal enhancement. No other acute intracranial hemorrhage. No acute cortically based infarct. No extra-axial fluid collection. Small remote left cerebellar infarct noted. Vascular: No abnormal hyperdense vessel seen prior to contrast administration. Normal intravascular enhancement seen throughout the major  intracranial vascular structures following contrast administration. Skull: Scalp soft tissues within normal limits. Calvarium intact. No focal osseous lesions. Sinuses/Orbits: Globes and orbital soft tissues within normal limits. Mucosal thickening about the left greater than right maxillary sinuses. No significant mastoid effusion. Other: None. IMPRESSION: 1. 2.6 cm peripherally enhancing mass centered at the left occipital cortex, with additional 3.5 cm enhancing mass at the inferior right cerebellum. 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 2. Associated vasogenic edema and regional mass effect about the right cerebellar lesion with partial effacement of the fourth ventricle. No hydrocephalus. 3. No other acute intracranial abnormality. Electronically Signed   By: Rise Mu M.D.   On: 11/14/2023 03:18  CT CHEST ABDOMEN PELVIS W CONTRAST  Result Date: 11/13/2023 CLINICAL DATA:  History of right lung cancer status post radiation with new suspected brain metastases. * Tracking Code: BO * EXAM: CT CHEST, ABDOMEN, AND PELVIS WITH CONTRAST TECHNIQUE: Multidetector CT imaging of the chest, abdomen and pelvis was performed following the standard protocol during bolus administration of intravenous contrast. RADIATION DOSE REDUCTION: This exam was performed according to the departmental dose-optimization program which includes automated exposure control, adjustment of the mA and/or kV according to patient size and/or use of iterative reconstruction technique. CONTRAST:  75mL OMNIPAQUE IOHEXOL 350 MG/ML SOLN COMPARISON:  CT chest dated 03/17/2022, CT abdomen and pelvis dated 05/08/2017 FINDINGS: CT CHEST FINDINGS Cardiovascular: Left anterior chest wall ICD with leads terminating in the right atrium and ventricle. Normal heart size. No significant pericardial fluid/thickening. Great vessels are normal in course and caliber. No central pulmonary emboli. Coronary artery calcifications.  Mediastinum/Nodes: Imaged thyroid gland without nodules meeting criteria for imaging follow-up by size. Normal esophagus. No pathologically enlarged axillary, supraclavicular, mediastinal, or hilar lymph nodes. Lungs/Pleura: The central airways are patent. Severe upper lobe predominant centrilobular and paraseptal emphysema. New irregular peripheral left upper lobe nodule measuring 1.3 x 0.8 cm (5:38). New subpleural right upper lobe 5 x 2 mm nodule (5:44). Decreased size of subpleural medial right lower lobe nodule measuring 1.1 x 0.6 cm (5:77), previously 1.4 x 1.0 cm. No pneumothorax. No pleural effusion. Musculoskeletal: No acute or abnormal lytic or blastic osseous lesions. Multilevel degenerative changes of the thoracic spine. CT ABDOMEN PELVIS FINDINGS Hepatobiliary: Segment 4 enhancing lesion measures 2.0 x 1.8 cm (3:57). No intra or extrahepatic biliary ductal dilation. Normal gallbladder. Pancreas: No focal lesions or main ductal dilation. Spleen: Normal in size without focal abnormality. Scattered calcified granulomata. Adrenals/Urinary Tract: No adrenal nodules. Left extrarenal pelvis. No suspicious renal mass on this noncontrast enhanced examination, calculi, or hydronephrosis. No focal bladder wall thickening. Stomach/Bowel: Normal appearance of the stomach. No evidence of bowel wall thickening, distention, or inflammatory changes. Normal appendix. Vascular/Lymphatic: Aortic atherosclerosis. No enlarged abdominal or pelvic lymph nodes. Reproductive: Prostate is unremarkable. Other: No free fluid, fluid collection, or free air. Musculoskeletal: No acute or abnormal lytic or blastic osseous findings. Postsurgical changes of the anterior abdominal wall. 3.8 x 1.3 cm lipoma in the right gluteal musculature. IMPRESSION: 1. New irregular peripheral left upper lobe pulmonary nodule measuring 1.3 x 0.8 cm and new subpleural right upper lobe 5 x 2 mm nodule, suspicious for pulmonary neoplasm, either primary or  metastatic. 2. Continued decrease in size of treated subpleural medial right lower lobe pulmonary nodule measuring 1.1 x 0.6 cm, previously 1.4 x 1.0 cm. 3. Hepatic segment 4 enhancing lesion measures 2.0 x 1.8 cm, suspicious for metastatic disease. 4. Aortic Atherosclerosis (ICD10-I70.0) and Emphysema (ICD10-J43.9). Coronary artery calcifications. Assessment for potential risk factor modification, dietary therapy or pharmacologic therapy may be warranted, if clinically indicated. Electronically Signed   By: Agustin Cree M.D.   On: 11/13/2023 18:00   CT ANGIO HEAD NECK W WO CM  Result Date: 11/12/2023 CLINICAL DATA:  vertigo, double vision, R facial droop suspected intraparenchymal EXAM: CT ANGIOGRAPHY HEAD AND NECK WITH AND WITHOUT CONTRAST TECHNIQUE: Multidetector CT imaging of the head and neck was performed using the standard protocol during bolus administration of intravenous contrast. Multiplanar CT image reconstructions and MIPs were obtained to evaluate the vascular anatomy. Carotid stenosis measurements (when applicable) are obtained utilizing NASCET criteria, using the distal internal carotid diameter as the denominator. RADIATION  DOSE REDUCTION: This exam was performed according to the departmental dose-optimization program which includes automated exposure control, adjustment of the mA and/or kV according to patient size and/or use of iterative reconstruction technique. CONTRAST:  75mL OMNIPAQUE IOHEXOL 350 MG/ML SOLN COMPARISON:  CT chest March 17, 2022.  MRI head September 01, 2021. FINDINGS: CT HEAD FINDINGS Brain: Suspected masses in the left occipital lobe and right posterior fossa, likely metastases. Substantial surrounding edema with mass effect with effacement of the fourth ventricle. No hydrocephalus. No definite superimposed acute large vascular territory infarct. No mass occupying acute hemorrhage. Vascular: No hyperdense vessel identified. Skull: No acute fracture. Sinuses/Orbits: No acute  findings. Other: No mastoid effusions. Review of the MIP images confirms the above findings CTA NECK FINDINGS Aortic arch: Aortic atherosclerosis. Great vessel origins are patent. Right carotid system: Atherosclerosis at the carotid bifurcation without greater than 50% stenosis. Left carotid system: Atherosclerosis at the carotid bifurcation without greater than 50% stenosis. Irregularity of the left ICA at the skull base with mildly beaded appearance. Vertebral arteries: Co dominant. No significant (greater than 50%) stenosis. Skeleton: No acute abnormality on limited assessment. Lower cervical degenerative change. Other neck: Approximally 3.1 x 2.5 cm cystic lesion subjacent to the strap musculature and abutting the hyoid and thyroid cartilage. Upper chest: Emphysema. New nodularity and pleural thickening in the left upper lobe, concerning for malignancy. Review of the MIP images confirms the above findings CTA HEAD FINDINGS Anterior circulation: Bilateral intracranial ICAs, MCAs, and ACAs are patent without proximal high-grade stenosis. Posterior circulation: Bilateral intradural vertebral arteries, basilar artery and bilateral posterior cerebral arteries are patent without proximal high-grade stenosis. Venous sinuses: As permitted by contrast timing, patent. Review of the MIP images confirms the above findings IMPRESSION: 1. Suspected masses in the left occipital lobe and right posterior fossa, concerning for metastases given the clinical history. Recommend MRI with contrast to further evaluate. 2. Substantial surrounding edema with mass effect and effacement of the fourth ventricle. No hydrocephalus at this time. 3. New nodularity and pleural thickening in the left upper lobe, concerning for malignancy. Recommend dedicated CT of the chest with contrast to fully characterize. 4. No emergent large vessel occlusion or proximal high-grade stenosis. 5. 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 A follow-up CT of the neck with contrast is recommended to fully evaluate and help exclude carcinoma. 6. Mild irregularity of the left ICA at the skull base with beaded appearance. This could be secondary to fibromuscular dysplasia or atherosclerosis. 7. Aortic Atherosclerosis (ICD10-I70.0) and Emphysema (ICD10-J43.9). Urgent findings discussed with Dr. Jarold Motto via telephone at 12:18 p.m. Electronically Signed   By: Feliberto Harts M.D.   On: 11/12/2023 12:28   CUP PACEART REMOTE DEVICE CHECK  Result Date: 11/10/2023 Scheduled remote reviewed. Normal device function.  Next remote 91 days. LA, CVRS  DG Lumbar Spine Complete  Result Date: 11/01/2023 CLINICAL DATA:  Pain EXAM: LUMBAR SPINE - COMPLETE 4+ VIEW COMPARISON:  08/21/2020 FINDINGS: Normal alignment. No fracture. Disc spaces maintained. Anterior degenerative spurring. Degenerative facet disease throughout the lumbar spine. No acute bony abnormality. IMPRESSION: No acute bony abnormality. Electronically Signed   By: Charlett Nose M.D.   On: 11/01/2023 03:39

## 2023-11-14 NOTE — Assessment & Plan Note (Addendum)
Still endorses dizziness, and diplopia.  Thought to be in the setting of brain metastatic mass  found on CTA head. Has history of squamous cell carcinoma of RLL. Patient not a candidate for MRI brain imaging given ICD. Had a fall yesterday which seems to be mechanical. Recent CT scan shows new lesions in the left upper lobe, right upper lobe and liver.   No change in mental status. -Oncology following, appreciate recs - Tylenol 1000 q6 PRN for headache - PT/OT with patient  - Falls precautions - CBC, BMP  - Continue decadron 4 mg Q8h per oncology -IR consulted for possible Iiver mass biopsy.

## 2023-11-14 NOTE — Progress Notes (Signed)
Daily Progress Note Intern Pager: [phone]  Patient name: Juan Norris Medical record number: [id number] Date of birth: [birth year] Age: 72 y.o. Gender: male  Primary Care Provider: [facility], [facility] Consultants: Oncology, radiology oncology, neurosurgery Code Status: DNR  Pt Overview and Major Events to Date:  11/29-admitted  Assessment and Plan: Juan Norris is a 72 year old male with PMH of SCC of R LL, MI, HLD, CAD, HTN, HFrEF, tobacco use presenting with diplopia and dizziness found to have multiple brain mass suspected to be metastatic from lung cancer.  Assessment & Plan Metastatic cancer (HCC) Still endorses dizziness, and diplopia.  Thought to be in the setting of brain metastatic mass  found on CTA head. Has history of squamous cell carcinoma of RLL. Patient not a candidate for MRI brain imaging given ICD. Had a fall yesterday which seems to be mechanical. Recent CT scan shows new lesions in the left upper lobe, right upper lobe and liver.   No change in mental status. -Oncology following, appreciate recs - Tylenol 1000 q6 PRN for headache - PT/OT with patient  - Falls precautions - CBC, BMP  - Continue decadron 4 mg Q8h per oncology -IR consulted for possible Iiver mass biopsy. Elevated blood sugar No history of diabetes most recent A1c obtained this morning was 6.4.  CBGs mostly in the 200s suspect this is secondary to his right treatment for metastatic brain cancer.  Will continue CBG monitoring.  - CBG -Started very sensitive SSI   Chronic and Stable Problems:  HLD: continue home atorvastatin 40 daily Coronary disease: continue home aspirin 81 daily HTN: continue home Entresto 24-26 BID, home spironolactone 12.5 daily  HFrEF: continue home carvedilol 25 daily and anti-hypertensives per above Tobacco use: continue home nicotine patch daily   FEN/GI: Regular PPx: Lovenox Dispo:Home pending clinical improvement .   Subjective:  Patient said he is doing  well.  No changes but still endorses double vision and remittent dizziness.  Otherwise no other concerns.  Objective: Temp:  [97.3 F (36.3 C)-98.5 F (36.9 C)] 97.3 F (36.3 C) (12/01 0501) Pulse Rate:  [59-80] 60 (12/01 0501) Resp:  [17-20] 18 (12/01 0501) BP: (122-171)/(41-68) 122/44 (12/01 0501) SpO2:  [96 %-100 %] 96 % (12/01 0501) Physical Exam: General: Recurrent Hippcups this morning, NAD Cardiovascular: RRR, no murmurs Respiratory: Normal WOB on RA, clear breath sounds bilaterally Abdomen: Soft, no distention or tenderness Extremities: No BLE edema. Neuro: PERRL, ANO x 3, no focal deficits  Laboratory: Most recent CBC Lab Results  Component Value Date   WBC 5.4 11/13/2023   HGB 12.1 (L) 11/13/2023   HCT 36.3 (L) 11/13/2023   MCV 87.7 11/13/2023   PLT 191 11/13/2023   Most recent BMP    Latest Ref Rng & Units 11/13/2023    4:38 AM  BMP  Glucose 70 - 99 mg/dL 440   BUN 8 - 23 mg/dL 18   Creatinine 3.47 - 1.24 mg/dL 4.25   Sodium 956 - 387 mmol/L 140   Potassium 3.5 - 5.1 mmol/L 4.1   Chloride 98 - 111 mmol/L 114   CO2 22 - 32 mmol/L 22   Calcium 8.9 - 10.3 mg/dL 9.0      Imaging/Diagnostic Tests: CT HEAD W & WO CONTRAST ( ) IMPRESSION:  1. 2.6 cm peripherally enhancing mass centered at the left occipital cortex, with additional 3.5 cm enhancing mass at the inferior right cerebellum. 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  2. Associated vasogenic edema and regional  mass effect about the right cerebellar lesion with partial effacement of the fourth ventricle. No hydrocephalus.  3. No other acute intracranial abnormality.   CT CHEST ABDOMEN PELVIS W CONTRAST IMPRESSION:  1. New irregular peripheral left upper lobe pulmonary nodule measuring 1.3 x 0.8 cm and new subpleural right upper lobe 5 x 2 mm nodule, suspicious for pulmonary neoplasm, either primary or metastatic.  2. Continued decrease in size of treated subpleural medial right  lower lobe pulmonary nodule measuring 1.1 x 0.6 cm, previously 1.4 x 1.0 cm.  3. Hepatic segment 4 enhancing lesion measures 2.0 x 1.8 cm, suspicious for metastatic disease. 4. Aortic Atherosclerosis (ICD10-I70.0) and Emphysema (ICD10-J43.9). Coronary artery calcifications.   Jerre Simon, MD 11/14/2023, 7:44 AM  PGY-3, [facility] FPTS Intern pager: [phone], text pages welcome Secure chat group [facility] Teaching Service

## 2023-11-14 NOTE — Progress Notes (Signed)
Mobility Specialist Progress Note;   11/14/23 1535  Mobility  Activity Ambulated with assistance in hallway  Level of Assistance Standby assist, set-up cues, supervision of patient - no hands on  Assistive Device None  Distance Ambulated (ft) 200 ft  Activity Response Tolerated well  Mobility Referral Yes  $Mobility charge 1 Mobility  Mobility Specialist Start Time (ACUTE ONLY) 1535  Mobility Specialist Stop Time (ACUTE ONLY) 1545  Mobility Specialist Time Calculation (min) (ACUTE ONLY) 10 min   Pt agreeable to mobility. Required no physical assistance during ambulation, SV. Did not wear eye patch while ambulating as pt stated he was doing fine. No c/o during session. Pt returned back to bed with all needs met. Visitors in room.   Juan Norris Mobility Specialist Please contact via SecureChat or Rehab Office [phone]

## 2023-11-14 NOTE — Assessment & Plan Note (Addendum)
No history of diabetes most recent A1c obtained this morning was 6.4.  CBGs mostly in the 200s suspect this is secondary to his right treatment for metastatic brain cancer.  Will continue CBG monitoring.  - CBG -Started very sensitive SSI

## 2023-11-14 NOTE — Plan of Care (Signed)

## 2023-11-15 ENCOUNTER — Inpatient Hospital Stay (HOSPITAL_COMMUNITY): Payer: No Typology Code available for payment source

## 2023-11-15 ENCOUNTER — Encounter (HOSPITAL_COMMUNITY): Payer: Self-pay | Admitting: Family Medicine

## 2023-11-15 DIAGNOSIS — C7931 Secondary malignant neoplasm of brain: Secondary | ICD-10-CM | POA: Diagnosis not present

## 2023-11-15 HISTORY — PX: IR IMAGING GUIDED PORT INSERTION: IMG5740

## 2023-11-15 LAB — BASIC METABOLIC PANEL
Anion gap: 9 (ref 5–15)
BUN: 18 mg/dL (ref 8–23)
CO2: 22 mmol/L (ref 22–32)
Calcium: 9.3 mg/dL (ref 8.9–10.3)
Chloride: 107 mmol/L (ref 98–111)
Creatinine, Ser: 1.07 mg/dL (ref 0.61–1.24)
GFR, Estimated: >60 mL/min (ref >60)
Glucose, Bld: 177 mg/dL — ABNORMAL HIGH (ref 70–99)
Potassium: 4.2 mmol/L (ref 3.5–5.1)
Sodium: 138 mmol/L (ref 135–145)

## 2023-11-15 LAB — GLUCOSE, CAPILLARY
Glucose-Capillary: 188 mg/dL — ABNORMAL HIGH (ref 70–99)
Glucose-Capillary: 243 mg/dL — ABNORMAL HIGH (ref 70–99)
Glucose-Capillary: 232 mg/dL — ABNORMAL HIGH (ref 70–99)
Glucose-Capillary: 249 mg/dL — ABNORMAL HIGH (ref 70–99)

## 2023-11-15 MED ORDER — HEPARIN SOD (PORK) LOCK FLUSH 100 UNIT/ML IV SOLN
500.0000 [IU] | Freq: Once | INTRAVENOUS | Status: AC
  Administered 2023-11-15: 500 [IU] via INTRAVENOUS
  Filled 2023-11-15: qty 5

## 2023-11-15 MED ORDER — MIDAZOLAM HCL 2 MG/2ML IJ SOLN
INTRAMUSCULAR | Status: AC | PRN
  Administered 2023-11-15: 1 mg via INTRAVENOUS
  Administered 2023-11-15: .5 mg via INTRAVENOUS

## 2023-11-15 MED ORDER — FENTANYL CITRATE (PF) 100 MCG/2ML IJ SOLN
INTRAMUSCULAR | Status: AC
  Filled 2023-11-15: qty 4

## 2023-11-15 MED ORDER — HEPARIN SOD (PORK) LOCK FLUSH 100 UNIT/ML IV SOLN
INTRAVENOUS | Status: AC
  Filled 2023-11-15: qty 5

## 2023-11-15 MED ORDER — LIDOCAINE-EPINEPHRINE 1 %-1:100000 IJ SOLN
INTRAMUSCULAR | Status: AC
  Filled 2023-11-15: qty 1

## 2023-11-15 MED ORDER — LIDOCAINE-EPINEPHRINE 1 %-1:100000 IJ SOLN
20.0000 mL | Freq: Once | INTRAMUSCULAR | Status: AC
  Administered 2023-11-15: 14 mL via INTRADERMAL
  Filled 2023-11-15: qty 20

## 2023-11-15 MED ORDER — MIDAZOLAM HCL 2 MG/2ML IJ SOLN
INTRAMUSCULAR | Status: AC
  Filled 2023-11-15: qty 4

## 2023-11-15 MED ORDER — DEXAMETHASONE 4 MG PO TABS
4.0000 mg | ORAL_TABLET | Freq: Three times a day (TID) | ORAL | Status: DC
  Administered 2023-11-15 – 2023-11-16 (×4): 4 mg via ORAL
  Filled 2023-11-15 (×4): qty 1

## 2023-11-15 MED ORDER — FUROSEMIDE 20 MG PO TABS
20.0000 mg | ORAL_TABLET | Freq: Every day | ORAL | Status: DC
Start: 2023-11-15 — End: 2023-11-16
  Administered 2023-11-15 – 2023-11-16 (×2): 20 mg via ORAL
  Filled 2023-11-15 (×2): qty 1

## 2023-11-15 MED ORDER — GADOBUTROL 1 MMOL/ML IV SOLN
7.0000 mL | Freq: Once | INTRAVENOUS | Status: AC | PRN
  Administered 2023-11-15: 7 mL via INTRAVENOUS

## 2023-11-15 MED ORDER — FENTANYL CITRATE (PF) 100 MCG/2ML IJ SOLN
INTRAMUSCULAR | Status: AC | PRN
  Administered 2023-11-15: 50 ug via INTRAVENOUS

## 2023-11-15 MED ORDER — METOCLOPRAMIDE HCL 5 MG PO TABS
5.0000 mg | ORAL_TABLET | Freq: Two times a day (BID) | ORAL | Status: DC | PRN
  Administered 2023-11-15 – 2023-11-16 (×2): 5 mg via ORAL
  Filled 2023-11-15 (×2): qty 1

## 2023-11-15 NOTE — Procedures (Signed)
Interventional Radiology Procedure Note  Procedure: RT internal jugular POWER PORT    Complications: None  Estimated Blood Loss:  MIN  Findings: SVCRA    M. TREVOR Eugena Rhue, MD    

## 2023-11-15 NOTE — Plan of Care (Addendum)
Patient refused bed alarms ; reinforced teaching to patient to call for assistance in going to bathroom or going to back to the bed Problem: Education: Goal: Knowledge of General Education information will improve Description: Including pain rating scale, medication(s)/side effects and non-pharmacologic comfort measures Outcome: Progressing   Problem: Health Behavior/Discharge Planning: Goal: Ability to manage health-related needs will improve Outcome: Progressing   Problem: Clinical Measurements: Goal: Ability to maintain clinical measurements within normal limits will improve Outcome: Progressing Goal: Will remain free from infection Outcome: Progressing Goal: Diagnostic test results will improve Outcome: Progressing Goal: Respiratory complications will improve Outcome: Progressing Goal: Cardiovascular complication will be avoided Outcome: Progressing   Problem: Activity: Goal: Risk for activity intolerance will decrease Outcome: Progressing   Problem: Nutrition: Goal: Adequate nutrition will be maintained Outcome: Progressing   Problem: Coping: Goal: Level of anxiety will decrease Outcome: Progressing   Problem: Elimination: Goal: Will not experience complications related to bowel motility Outcome: Progressing Goal: Will not experience complications related to urinary retention Outcome: Progressing   Problem: Pain Management: Goal: General experience of comfort will improve Outcome: Progressing   Problem: Safety: Goal: Ability to remain free from injury will improve Outcome: Progressing   Problem: Skin Integrity: Goal: Risk for impaired skin integrity will decrease Outcome: Progressing   Problem: Education: Goal: Ability to describe self-care measures that may prevent or decrease complications (Diabetes Survival Skills Education) will improve Outcome: Progressing Goal: Individualized Educational Video(s) Outcome: Progressing   Problem: Coping: Goal:  Ability to adjust to condition or change in health will improve Outcome: Progressing   Problem: Fluid Volume: Goal: Ability to maintain a balanced intake and output will improve Outcome: Progressing   Problem: Health Behavior/Discharge Planning: Goal: Ability to identify and utilize available resources and services will improve Outcome: Progressing Goal: Ability to manage health-related needs will improve Outcome: Progressing   Problem: Metabolic: Goal: Ability to maintain appropriate glucose levels will improve Outcome: Progressing   Problem: Nutritional: Goal: Maintenance of adequate nutrition will improve Outcome: Progressing Goal: Progress toward achieving an optimal weight will improve Outcome: Progressing   Problem: Skin Integrity: Goal: Risk for impaired skin integrity will decrease Outcome: Progressing   Problem: Tissue Perfusion: Goal: Adequacy of tissue perfusion will improve Outcome: Progressing

## 2023-11-15 NOTE — Assessment & Plan Note (Addendum)
No history of diabetes and most recent A1c obtained this morning was 6.4.  CBGs 160 to 210, likely in the setting of Decadron.  Will continue CBG monitoring. -CBGs ACHS -Very sensitive SSI

## 2023-11-15 NOTE — Assessment & Plan Note (Addendum)
Still endorses dizziness, and diplopia.  Thought to be in the setting of brain metastatic mass  found on CTA head. Has history of squamous cell carcinoma of RLL. Patient not a candidate for MRI brain imaging given ICD. Had a fall yesterday which seems to be mechanical. Recent CT scan shows new lesions in the left upper lobe, right upper lobe and liver.   No change in mental status. -Tylenol 1000 Q6h PRN for headache -Metoclopramide BID PRN for hiccups -PT/OT with patient -Falls precautions -Continue decadron 4 mg Q8h per Oncology, will continue outpatient at discharge -IR consulted and will place Port-A-Cath today, obtain liver biopsy outpatient Monday 12/9 after 7 days aspirin hold  -Hold aspirin until after liver biopsy -Med Onc following, appreciate recommendations, Radiation Oncology consulted for outpatient radiation -Palliative consulted today, follow up recommendations -AM CBC, BMP

## 2023-11-15 NOTE — Discharge Instructions (Addendum)
Dear Juan Norris,  Thank you for letting us participate in your care. You were hospitalized for dizziness diagnosed with a new mass in your brain.  You also have new lung nodules and liver nodules that are suspicious for recurrence of your lung cancer.  You need to follow-up with the medical oncologist outpatient to possibly have further imaging and biopsy.  POST-HOSPITAL & CARE INSTRUCTIONS Follow-up with your oncologist and radiation oncology. Make a follow-up appointment with your primary care provider. [facility] in [facility] to have them help coordinate [facility] for you to them from your appointments. He should have an appointment with outpatient palliative care with your oncology appointments. Go to your follow up appointments (listed below)   DOCTOR'S APPOINTMENT   Future Appointments  Date Time Provider Department Center  02/09/2024  8:45 AM [facility] DEVICE REMOTES [facility]  05/10/2024  8:45 AM [facility] DEVICE REMOTES [facility]     Take care and be well!  Family Medicine Teaching Service Inpatient Team [place]  [facility]  [address] [place], [place] [address] [phone]

## 2023-11-15 NOTE — Progress Notes (Signed)
Physical Therapy Treatment and Discharge Patient Details Name: Juan Norris MRN: [id number] DOB: 02-27-1951 Today's Date: 11/15/2023   History of Present Illness 72 year old  presenting with diplopia and dizziness, found to have multiple brain masses on CT imaging.  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    PT Comments  Patient reports no longer has diplopia or headache after having steroids. Demonstrated independent ambulation, including on portions of Dynamic Gait Index. No further PT needs identified.    If plan is discharge home, recommend the following: Assist for transportation   Can travel by private vehicle        Equipment Recommendations  None recommended by PT    Recommendations for Other Services       Precautions / Restrictions Precautions Precautions: Fall Precaution Comments: denies diplopia or headache Restrictions Weight Bearing Restrictions: No     Mobility  Bed Mobility Overal bed mobility: Independent                  Transfers Overall transfer level: Independent Equipment used: None Transfers: Sit to/from Stand Sit to Stand: Independent           General transfer comment: denied dizziness or diplopia    Ambulation/Gait Ambulation/Gait assistance: Independent Gait Distance (Feet): 250 Feet Assistive device: None Gait Pattern/deviations: WFL(Within Functional Limits)       General Gait Details: pt initially with slowed gait; velocity improved as he progressed. no eye patch and pt keeping both eyes open with no diplopia   Stairs             Wheelchair Mobility     Tilt Bed    Modified Rankin (Stroke Patients Only)       Balance Overall balance assessment: Needs assistance Sitting-balance support: Feet supported, No upper extremity supported Sitting balance-Leahy Scale: Normal     Standing balance support: No upper extremity supported, During functional  activity Standing balance-Leahy Scale: Normal                   Standardized Balance Assessment Standardized Balance Assessment : Dynamic Gait Index   Dynamic Gait Index Level Surface: Normal Change in Gait Speed: Normal Gait with Horizontal Head Turns: Normal Step Around Obstacles: Normal Steps: Normal      Cognition Arousal: Alert Behavior During Therapy: WFL for tasks assessed/performed Overall Cognitive Status: Within Functional Limits for tasks assessed                                          Exercises      General Comments General comments (skin integrity, edema, etc.): Patient reported he only had a few minutes to work with therapy 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      Pertinent Vitals/Pain Pain Assessment Pain Assessment: No/denies pain    Home Living                          Prior Function            PT Goals (current goals can now be found in the care plan section) Acute Rehab PT Goals Patient Stated Goal: to go home PT Goal Formulation: With patient Time For Goal Achievement: 11/27/23 Potential to Achieve Goals: Good Progress towards PT goals: Goals met/education completed, patient discharged from PT  Frequency           PT Plan      Co-evaluation              AM-PAC PT "6 Clicks" Mobility   Outcome Measure  Help needed turning from your back to your side while in a flat bed without using bedrails?: None Help needed moving from lying on your back to sitting on the side of a flat bed without using bedrails?: None Help needed moving to and from a bed to a chair (including a wheelchair)?: None Help needed standing up from a chair using your arms (e.g., wheelchair or bedside chair)?: None Help needed to walk in hospital room?: None Help needed climbing 3-5 steps with a railing? : None 6 Click Score: 24    End of Session Equipment Utilized During Treatment:  (pt refused gait belt "I'm  fine") Activity Tolerance: Patient tolerated treatment well Patient left: with call bell/phone within reach;in chair Nurse Communication: Mobility status;Other (comment) (dc from PT) PT Visit Diagnosis: Other abnormalities of gait and mobility (R26.89)   PT Discharge Note  Patient is being discharged from PT services secondary to:  Goals met and no further therapy needs identified.  Please see latest Therapy Progress Note for current level of functioning and progress toward goals.  Progress and discharge plan and discussed with patient/caregiver and they  Agree    Time: 1221-1231 PT Time Calculation (min) (ACUTE ONLY): 10 min  Charges:    $Gait Training: 8-22 mins PT General Charges $$ ACUTE PT VISIT: 1 Visit                      Jerolyn Center, PT Acute Rehabilitation Services  Office [phone]    Zena Amos 11/15/2023, 12:37 PM

## 2023-11-15 NOTE — Consult Note (Addendum)
Chief Complaint: Metastatic cancer  Referring Provider(s): Avinash Pasam  Supervising Physician: Ruel Favors  Patient Status: [name] - In-pt  History of Present Illness: Juan Norris is a 72 y.o. male with history of right lung squamous cell carcinoma.   He presented to the ED on 11/03/2023 with complaints of double vision, dizziness and falls for the last week.   CT head showed left occipital lobe mass and right posterior fossa mass consistent with metastatic disease.   MRI of the brain was not be obtained because of AICD. It was initially thought to be non compatible with MRI. - [employer] rep was contacted and the patient CAN have an MRI.   CT chest, abdomen and pelvis done 11/13/23 showed new irregular peripheral left upper lobe lung nodule measuring 1.3 x 0.8 cm and new subpleural right upper lobe 5 x 2 mm nodule, suspicious for pulmonary neoplasm.  Hepatic segment 4 lesion measuring 2 x 1.8 cm, suspicious for metastatic disease.   We are asked to perform a biopsy of the liver lesion for tissue confirmation and to place a Port-A-Cath for chemoimmunotherapy.    He is NPO. He takes aspirin and his last dose was yesterday (11/14/23).  Patient is DNR   Past Medical History:  Diagnosis Date   Acute renal insufficiency    Anginal pain (HCC)    Chronic lower back pain    "due to 3 ruptured discs in my back" (11/08/2018)   High cholesterol    History of kidney stones 1982   "passed them"   History of radiation therapy    Right lung- 11/03/21-11/14/21- Dr. Joylene Draft   Hypertension    Lung cancer [facility])    Myocardial infarction (HCC) 03/2018   Systolic heart failure (HCC)    Tobacco use     Past Surgical History:  Procedure Laterality Date   BIV ICD INSERTION CRT-D N/A 11/08/2018    [employer] biventricular ICD implantation by Dr Johney Frame with His Bundle pacing and a MDT 3830 lead placed into the LV port.    BRONCHIAL BIOPSY  10/13/2021   Procedure: BRONCHIAL BIOPSIES;   Surgeon: Leslye Peer, MD;  Location: [facility] ENDOSCOPY;  Service: Pulmonary;;   BRONCHIAL BRUSHINGS  10/13/2021   Procedure: BRONCHIAL BRUSHINGS;  Surgeon: Leslye Peer, MD;  Location: [facility] ENDOSCOPY;  Service: Pulmonary;;   BRONCHIAL NEEDLE ASPIRATION BIOPSY  10/13/2021   Procedure: BRONCHIAL NEEDLE ASPIRATION BIOPSIES;  Surgeon: Leslye Peer, MD;  Location: [facility] ENDOSCOPY;  Service: Pulmonary;;   BRONCHIAL WASHINGS  10/13/2021   Procedure: BRONCHIAL WASHINGS;  Surgeon: Leslye Peer, MD;  Location: [facility] ENDOSCOPY;  Service: Pulmonary;;   CARDIAC CATHETERIZATION     FIDUCIAL MARKER PLACEMENT  10/13/2021   Procedure: FIDUCIAL MARKER PLACEMENT;  Surgeon: Leslye Peer, MD;  Location: [facility] ENDOSCOPY;  Service: Pulmonary;;   INGUINAL HERNIA REPAIR Bilateral    KNEE SURGERY Left 1990s   "growth on the back of my knee removed"   LEFT HEART CATH AND CORONARY ANGIOGRAPHY N/A 03/30/2018   Procedure: LEFT HEART CATH AND CORONARY ANGIOGRAPHY;  Surgeon: Kathleene Hazel, MD;  Location: [facility] INVASIVE CV LAB;  Service: Cardiovascular;  Laterality: N/A;   VIDEO BRONCHOSCOPY WITH ENDOBRONCHIAL NAVIGATION N/A 10/13/2021   Procedure: ROBOTIC VIDEO BRONCHOSCOPY WITH ENDOBRONCHIAL NAVIGATION;  Surgeon: Leslye Peer, MD;  Location: [facility] ENDOSCOPY;  Service: Pulmonary;  Laterality: N/A;   VIDEO BRONCHOSCOPY WITH RADIAL ENDOBRONCHIAL ULTRASOUND  10/13/2021   Procedure: RADIAL ENDOBRONCHIAL ULTRASOUND;  Surgeon: Levy Pupa  S, MD;  Location: [facility] ENDOSCOPY;  Service: Pulmonary;;    Allergies: Patient has no known allergies.  Medications: Prior to Admission medications   Medication Sig Start Date End Date Taking? Authorizing Provider  aspirin 81 MG EC tablet Take 1 tablet (81 mg total) by mouth daily. 11/09/18  Yes Sheilah Pigeon, PA-C  atorvastatin (LIPITOR) 40 MG tablet Take 1 tablet (40 mg total) by mouth at bedtime. 10/13/21  Yes Leslye Peer, MD  carvedilol (COREG) 25 MG tablet Take 12.5 mg by  mouth daily.   Yes [provider]  furosemide (LASIX) 20 MG tablet Take 1 tablet (20 mg total) by mouth daily. As directed 02/12/23  Yes Gaston Islam., NP  Multiple Vitamin (MULTIVITAMIN WITH MINERALS) TABS tablet Take 1 tablet by mouth daily.   Yes [provider]  nicotine (NICODERM CQ - DOSED IN MG/24 HOURS) 21 mg/24hr patch Place 21 mg onto the skin daily. Last patch was applied 3-4 days ago, no patch is on today 11/12/2023. 04/30/23 04/30/24 Yes [provider]  sacubitril-valsartan (ENTRESTO) 24-26 MG Take 1 tablet by mouth 2 (two) times daily. Please make overdue appt with Dr. Delton See before anymore refills. 1st attempt 08/29/20  Yes Lars Masson, MD  spironolactone (ALDACTONE) 25 MG tablet Take 0.5 tablets (12.5 mg total) by mouth daily. 03/11/20  Yes Lars Masson, MD     Family History  Problem Relation Age of Onset   Leukemia Mother    Cancer Father    Cancer Sister    Cancer Brother    CAD Brother     Social History   Socioeconomic History   Marital status: Divorced    Spouse name: Not on file   Number of children: 1   Years of education: Not on file   Highest education level: Not on file  Occupational History   Not on file  Tobacco Use   Smoking status: Every Day    Current packs/day: 1.50    Average packs/day: 1.5 packs/day for 47.0 years (70.5 ttl pk-yrs)    Types: Cigarettes   Smokeless tobacco: Never   Tobacco comments:    1 cigarette smoked daily 05/28/22 [facility]   Vaping Use   Vaping status: Never Used  Substance and Sexual Activity   Alcohol use: Not Currently    Comment: 11/08/2018 "stopped 09/23/1998"   Drug use: Not Currently    Comment: 11/08/2018 "nothing in the 2000s"   Sexual activity: Yes  Other Topics Concern   Not on file  Social History Narrative   Retired   International aid/development worker of [employer] Strain: Not on file  Food Insecurity: No Food Insecurity (11/12/2023)   Hunger Vital Sign     Worried About Running Out of Food in the Last Year: Never true    Ran Out of Food in the Last Year: Never true  Transportation Needs: No Transportation Needs (11/12/2023)   PRAPARE - Administrator, Civil Service (Medical): No    Lack of Transportation (Non-Medical): No  Physical Activity: Not on file  Stress: Not on file  Social Connections: Not on file     Review of Systems: A 12 point ROS discussed and pertinent positives are indicated in the HPI above.  All other systems are negative.  Review of Systems  Vital Signs: BP (!) 138/51 (BP Location: Left Arm)   Pulse (!) 59   Temp 97.8 F (36.6 C) (Oral)   Resp 18  Ht 5\' 6"  (1.676 m)   Wt 147 lb 11.3 oz (67 kg)   SpO2 99%   BMI 23.84 kg/m   Advance Care Plan: The advanced care place/surrogate decision maker was discussed at the time of visit and the patient did not wish to discuss or was not able to name a surrogate decision maker or provide an advance care plan.  Physical Exam Vitals reviewed.  Constitutional:      Appearance: Normal appearance.  HENT:     Head: Normocephalic and atraumatic.  Eyes:     Extraocular Movements: Extraocular movements intact.  Cardiovascular:     Rate and Rhythm: Normal rate and regular rhythm.  Pulmonary:     Effort: Pulmonary effort is normal. No respiratory distress.     Breath sounds: Normal breath sounds.  Abdominal:     General: There is no distension.     Palpations: Abdomen is soft.     Tenderness: There is no abdominal tenderness.  Musculoskeletal:        General: Normal range of motion.     Cervical back: Normal range of motion.  Skin:    General: Skin is warm and dry.  Neurological:     General: No focal deficit present.     Mental Status: He is alert and oriented to person, place, and time.  Psychiatric:        Mood and Affect: Mood normal.        Behavior: Behavior normal.        Thought Content: Thought content normal.        Judgment: Judgment normal.      Imaging: US Abdomen Limited RUQ (LIVER/GB)  Result Date: 11/14/2023 CLINICAL DATA:  Liver lesion. EXAM: ULTRASOUND ABDOMEN LIMITED RIGHT UPPER QUADRANT COMPARISON:  CT 11/13/2023. FINDINGS: Gallbladder: No gallstones or wall thickening visualized. No sonographic Murphy sign noted by sonographer. Common bile duct: Diameter: 4 mm Liver: Echogenic hepatic parenchyma consistent with fatty infiltration. There is hypoechoic areas seen in the liver measuring 17 mm. Portal vein is patent on color Doppler imaging with normal direction of blood flow towards the liver. Other: None. IMPRESSION: No gallstones or ductal dilatation. Fatty liver infiltration. 17 mm hypoechoic area seen in the liver. Indeterminate lesion by ultrasound. By CT this could be a hemangioma. Recommend dedicated MRI with and without contrast with dynamic exam when clinically appropriate to confirm etiology Electronically Signed   By: Karen Kays M.D.   On: 11/14/2023 13:28   CT HEAD W & WO CONTRAST ( )  Result Date: 11/14/2023 CLINICAL DATA:  Initial evaluation for brain/CNS neoplasm, monitor. EXAM: CT HEAD WITHOUT AND WITH CONTRAST TECHNIQUE: Contiguous axial images were obtained from the base of the skull through the vertex without and with intravenous contrast. RADIATION DOSE REDUCTION: This exam was performed according to the departmental dose-optimization program which includes automated exposure control, adjustment of the mA and/or kV according to patient size and/or use of iterative reconstruction technique. CONTRAST:  75mL OMNIPAQUE IOHEXOL 350 MG/ML SOLN COMPARISON:  Prior CT from 11/12/2023. FINDINGS: Brain: Partially cystic peripherally enhancing mass centered at the left occipital cortex measures 2.4 x 2.3 x 2.6 cm (series 9, image 15). Surrounding vasogenic edema without significant regional mass effect. Additional partially cystic heterogeneous peripherally enhancing mass positioned at the inferior right cerebellar  hemisphere measures 3.5 x 2.3 x 3.2 cm (series 9, image 22). Surrounding edema with regional mass effect within the right posterior fossa. Partial effacement of the fourth ventricle and fourth ventricular outflow tract.  No hydrocephalus. Basilar cisterns are mildly crowded but remain patent. These lesions demonstrate some intrinsic hyperdensity, which could reflect blood products. 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 No other visible mass lesion or abnormal enhancement. No other acute intracranial hemorrhage. No acute cortically based infarct. No extra-axial fluid collection. Small remote left cerebellar infarct noted. Vascular: No abnormal hyperdense vessel seen prior to contrast administration. Normal intravascular enhancement seen throughout the major intracranial vascular structures following contrast administration. Skull: Scalp soft tissues within normal limits. Calvarium intact. No focal osseous lesions. Sinuses/Orbits: Globes and orbital soft tissues within normal limits. Mucosal thickening about the left greater than right maxillary sinuses. No significant mastoid effusion. Other: None. IMPRESSION: 1. 2.6 cm peripherally enhancing mass centered at the left occipital cortex, with additional 3.5 cm enhancing mass at the inferior right cerebellum. 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 2. Associated vasogenic edema and regional mass effect about the right cerebellar lesion with partial effacement of the fourth ventricle. No hydrocephalus. 3. No other acute intracranial abnormality. Electronically Signed   By: Rise Mu M.D.   On: 11/14/2023 03:18   CT CHEST ABDOMEN PELVIS W CONTRAST  Result Date: 11/13/2023 CLINICAL DATA:  History of right lung cancer status post radiation with new suspected brain metastases. * Tracking Code: BO * EXAM: CT CHEST, ABDOMEN, AND PELVIS WITH CONTRAST TECHNIQUE: Multidetector CT imaging of the chest, abdomen and pelvis  was performed following the standard protocol during bolus administration of intravenous contrast. RADIATION DOSE REDUCTION: This exam was performed according to the departmental dose-optimization program which includes automated exposure control, adjustment of the mA and/or kV according to patient size and/or use of iterative reconstruction technique. CONTRAST:  75mL OMNIPAQUE IOHEXOL 350 MG/ML SOLN COMPARISON:  CT chest dated 03/17/2022, CT abdomen and pelvis dated 05/08/2017 FINDINGS: CT CHEST FINDINGS Cardiovascular: Left anterior chest wall ICD with leads terminating in the right atrium and ventricle. Normal heart size. No significant pericardial fluid/thickening. Great vessels are normal in course and caliber. No central pulmonary emboli. Coronary artery calcifications. Mediastinum/Nodes: Imaged thyroid gland without nodules meeting criteria for imaging follow-up by size. Normal esophagus. No pathologically enlarged axillary, supraclavicular, mediastinal, or hilar lymph nodes. Lungs/Pleura: The central airways are patent. Severe upper lobe predominant centrilobular and paraseptal emphysema. New irregular peripheral left upper lobe nodule measuring 1.3 x 0.8 cm (5:38). New subpleural right upper lobe 5 x 2 mm nodule (5:44). Decreased size of subpleural medial right lower lobe nodule measuring 1.1 x 0.6 cm (5:77), previously 1.4 x 1.0 cm. No pneumothorax. No pleural effusion. Musculoskeletal: No acute or abnormal lytic or blastic osseous lesions. Multilevel degenerative changes of the thoracic spine. CT ABDOMEN PELVIS FINDINGS Hepatobiliary: Segment 4 enhancing lesion measures 2.0 x 1.8 cm (3:57). No intra or extrahepatic biliary ductal dilation. Normal gallbladder. Pancreas: No focal lesions or main ductal dilation. Spleen: Normal in size without focal abnormality. Scattered calcified granulomata. Adrenals/Urinary Tract: No adrenal nodules. Left extrarenal pelvis. No suspicious renal mass on this noncontrast  enhanced examination, calculi, or hydronephrosis. No focal bladder wall thickening. Stomach/Bowel: Normal appearance of the stomach. No evidence of bowel wall thickening, distention, or inflammatory changes. Normal appendix. Vascular/Lymphatic: Aortic atherosclerosis. No enlarged abdominal or pelvic lymph nodes. Reproductive: Prostate is unremarkable. Other: No free fluid, fluid collection, or free air. Musculoskeletal: No acute or abnormal lytic or blastic osseous findings. Postsurgical changes of the anterior abdominal wall. 3.8 x 1.3 cm lipoma in the right gluteal musculature. IMPRESSION: 1. New irregular peripheral left upper lobe pulmonary nodule  measuring 1.3 x 0.8 cm and new subpleural right upper lobe 5 x 2 mm nodule, suspicious for pulmonary neoplasm, either primary or metastatic. 2. Continued decrease in size of treated subpleural medial right lower lobe pulmonary nodule measuring 1.1 x 0.6 cm, previously 1.4 x 1.0 cm. 3. Hepatic segment 4 enhancing lesion measures 2.0 x 1.8 cm, suspicious for metastatic disease. 4. Aortic Atherosclerosis (ICD10-I70.0) and Emphysema (ICD10-J43.9). Coronary artery calcifications. Assessment for potential risk factor modification, dietary therapy or pharmacologic therapy may be warranted, if clinically indicated. Electronically Signed   By: Agustin Cree M.D.   On: 11/13/2023 18:00   CT ANGIO HEAD NECK W WO CM  Result Date: 11/12/2023 CLINICAL DATA:  vertigo, double vision, R facial droop suspected intraparenchymal EXAM: CT ANGIOGRAPHY HEAD AND NECK WITH AND WITHOUT CONTRAST TECHNIQUE: Multidetector CT imaging of the head and neck was performed using the standard protocol during bolus administration of intravenous contrast. Multiplanar CT image reconstructions and MIPs were obtained to evaluate the vascular anatomy. Carotid stenosis measurements (when applicable) are obtained utilizing NASCET criteria, using the distal internal carotid diameter as the denominator. RADIATION  DOSE REDUCTION: This exam was performed according to the departmental dose-optimization program which includes automated exposure control, adjustment of the mA and/or kV according to patient size and/or use of iterative reconstruction technique. CONTRAST:  75mL OMNIPAQUE IOHEXOL 350 MG/ML SOLN COMPARISON:  CT chest March 17, 2022.  MRI head September 01, 2021. FINDINGS: CT HEAD FINDINGS Brain: Suspected masses in the left occipital lobe and right posterior fossa, likely metastases. Substantial surrounding edema with mass effect with effacement of the fourth ventricle. No hydrocephalus. No definite superimposed acute large vascular territory infarct. No mass occupying acute hemorrhage. Vascular: No hyperdense vessel identified. Skull: No acute fracture. Sinuses/Orbits: No acute findings. Other: No mastoid effusions. Review of the MIP images confirms the above findings CTA NECK FINDINGS Aortic arch: Aortic atherosclerosis. Great vessel origins are patent. Right carotid system: Atherosclerosis at the carotid bifurcation without greater than 50% stenosis. Left carotid system: Atherosclerosis at the carotid bifurcation without greater than 50% stenosis. Irregularity of the left ICA at the skull base with mildly beaded appearance. Vertebral arteries: Co dominant. No significant (greater than 50%) stenosis. Skeleton: No acute abnormality on limited assessment. Lower cervical degenerative change. Other neck: Approximally 3.1 x 2.5 cm cystic lesion subjacent to the strap musculature and abutting the hyoid and thyroid cartilage. Upper chest: Emphysema. New nodularity and pleural thickening in the left upper lobe, concerning for malignancy. Review of the MIP images confirms the above findings CTA HEAD FINDINGS Anterior circulation: Bilateral intracranial ICAs, MCAs, and ACAs are patent without proximal high-grade stenosis. Posterior circulation: Bilateral intradural vertebral arteries, basilar artery and bilateral posterior  cerebral arteries are patent without proximal high-grade stenosis. Venous sinuses: As permitted by contrast timing, patent. Review of the MIP images confirms the above findings IMPRESSION: 1. Suspected masses in the left occipital lobe and right posterior fossa, concerning for metastases given the clinical history. Recommend MRI with contrast to further evaluate. 2. Substantial surrounding edema with mass effect and effacement of the fourth ventricle. No hydrocephalus at this time. 3. New nodularity and pleural thickening in the left upper lobe, concerning for malignancy. Recommend dedicated CT of the chest with contrast to fully characterize. 4. No emergent large vessel occlusion or proximal high-grade stenosis. 5. 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 A follow-up  CT of the neck with contrast is recommended to fully evaluate and help exclude carcinoma. 6. Mild irregularity of the left ICA at the skull base with beaded appearance. This could be secondary to fibromuscular dysplasia or atherosclerosis. 7. Aortic Atherosclerosis (ICD10-I70.0) and Emphysema (ICD10-J43.9). Urgent findings discussed with Dr. Jarold Motto via telephone at 12:18 p.m. Electronically Signed   By: Feliberto Harts M.D.   On: 11/12/2023 12:28   CUP PACEART REMOTE DEVICE CHECK  Result Date: 11/10/2023 Scheduled remote reviewed. Normal device function.  Next remote 91 days. LA, CVRS  DG Lumbar Spine Complete  Result Date: 11/01/2023 CLINICAL DATA:  Pain EXAM: LUMBAR SPINE - COMPLETE 4+ VIEW COMPARISON:  08/21/2020 FINDINGS: Normal alignment. No fracture. Disc spaces maintained. Anterior degenerative spurring. Degenerative facet disease throughout the lumbar spine. No acute bony abnormality. IMPRESSION: No acute bony abnormality. Electronically Signed   By: Charlett Nose M.D.   On: 11/01/2023 03:39     Labs:  CBC: Recent Labs    10/31/23 2154 11/12/23 1047 11/12/23 1050 11/13/23 0438  WBC 8.5 7.8  --  5.4  HGB 13.4 13.4 12.9* 12.1*  HCT 39.7 40.6 38.0* 36.3*  PLT 236 199  --  191    COAGS: No results for input(s): "INR", "APTT" in the last 8760 hours.  BMP: Recent Labs    10/31/23 2154 11/12/23 1047 11/12/23 1050 11/13/23 0438 11/15/23 0758  NA 136 142 145 140 138  K 3.8 4.2 4.1 4.1 4.2  CL 106 111 108 114* 107  CO2 20* 24  --  22 22  GLUCOSE 119* 123* 115* 200* 177*  BUN 15 11 12 18 18   CALCIUM 9.6 9.2  --  9.0 9.3  CREATININE 1.17 0.99 1.00 1.04 1.07  GFRNONAA >60 >60  --  >60 >60    LIVER FUNCTION TESTS: Recent Labs    10/31/23 2154 11/12/23 1047  BILITOT 0.8 0.7  AST 25 28  ALT 21 28  ALKPHOS 87 73  PROT 7.4 6.9  ALBUMIN 4.0 3.6    TUMOR MARKERS: No results for input(s): "AFPTM", "CEA", "CA199", "CHROMGRNA" in the last 8760 hours.  Assessment and Plan:  History of right lung squamous cell carcinoma.    CT showed new irregular peripheral left upper lobe lung nodule measuring 1.3 x 0.8 cm and new subpleural right upper lobe 5 x 2 mm nodule, suspicious for pulmonary neoplasm.  Hepatic segment 4 lesion measuring 2 x 1.8 cm, suspicious for metastatic disease.  Images reviewed by Dr. Miles Costain.  Will plan for placement of a tunneled catheter with port today and plan for the liver lesion biopsy as an outpatient.   Risks and benefits of image guided port-a-catheter placement was discussed with the patient including, but not limited to bleeding, infection, pneumothorax, or fibrin sheath development and need for additional procedures.  All of the questions were answered and there is agreement to proceed.  Consents signed and in chart.  He will obtain an MRI of brain and liver (ICD is compatible with MRI).  If he still needs a liver biopsy order will be placed as outpatient and providers/patient understand to continue to hold aspirin.  Patient  currently has DNR order in place. Discussion with the patient and family regarding wishes.  The original DNR order is maintained and prior treatment limitations are upheld.  Thank you for allowing our service to participate in PUAL CHEADLE 's care.  Electronically Signed: Gwynneth Macleod, PA-C   11/15/2023, 10:21 AM  I spent a total of 40 Minutes  in face to face in clinical consultation, greater than 50% of which was counseling/coordinating care for [facility] A Cath placement.

## 2023-11-15 NOTE — Progress Notes (Signed)
Inpatient Rehab Admissions Coordinator:   Per therapy recommendations, patient was screened for CIR candidacy by Megan Salon, MS, CCC-SLP. At this time, Pt. does not appear to demonstrate enough functional deficits to justify in hospital rehabilitation/CIR. He walked 350 feet with supervision and transferred independently with PT and they are recommending outpatient PT. I  will not pursue a rehab consult for this Pt.   Recommend other rehab venues to be pursued.  Please contact me with any questions.

## 2023-11-15 NOTE — Assessment & Plan Note (Signed)
Per main problem above.

## 2023-11-15 NOTE — Progress Notes (Signed)
Daily Progress Note Intern Pager: [phone]  Patient name: Juan Norris Medical record number: [id number] Date of birth: 02-13-1951 Age: 72 y.o. Gender: male  Primary Care Provider: [facility] Consultants: Med Onc, IR, Rad Onc, Palliative Code Status: DNR, limited interventions  Pt Overview and Major Events to Date:  11/29 - Admitted, brain mets on CT 12/2 - Radiation oncology consulted, Port-A-Cath placed by IR   Assessment and Plan: Juan Norris is a 72 year old male with PMH of SCC of R LL, MI, HLD, CAD, HTN, HFrEF, tobacco use presenting with diplopia and dizziness found to have multiple brain mass suspected to be metastatic from lung cancer, now with possible metastasis to liver also noted.  This morning, after discussion with MedOnc, the plan is for patient to start radiation treatment outpatient and radiation oncology has been consulted.  IR will plan to do liver biopsy outpatient as well and has been scheduled for Monday 12/9 and aspirin will be held for this procedure.  There are no further acute issues keeping patient in the hospital, and his Port-A-Cath has been placed today.  Patient assents palliative care consult, which she would likely benefit from given concerning prognosis in the setting of multiple metastases.  Will coordinate transportation to assistance to appointments with TOC, patient may qualify for SCAT given that he uses walker.  Patient can discharge tomorrow. Assessment & Plan Metastatic cancer (HCC) Still endorses dizziness, and diplopia.  Thought to be in the setting of brain metastatic mass  found on CTA head. Has history of squamous cell carcinoma of RLL. Patient not a candidate for MRI brain imaging given ICD. Had a fall yesterday which seems to be mechanical. Recent CT scan shows new lesions in the left upper lobe, right upper lobe and liver.   No change in mental status. -Tylenol 1000 Q6h PRN for headache -Metoclopramide BID PRN for  hiccups -PT/OT with patient -Falls precautions -Continue decadron 4 mg Q8h per Oncology, will continue outpatient at discharge -IR consulted and will place Port-A-Cath today, obtain liver biopsy outpatient Monday 12/9 after 7 days aspirin hold  -Hold aspirin until after liver biopsy -Med Onc following, appreciate recommendations, Radiation Oncology consulted for outpatient radiation -Palliative consulted today, follow up recommendations -AM CBC, BMP Elevated blood sugar No history of diabetes and most recent A1c obtained this morning was 6.4.  CBGs 160 to 210, likely in the setting of Decadron.  Will continue CBG monitoring. -CBGs ACHS -Very sensitive SSI Non-small cell cancer of right lung (HCC) Per main problem above.  Chronic and Stable Problems: HLD: Continue home atorvastatin 40 daily CAD: Continue home aspirin 81 daily HTN: Continue home Entresto 24-26 BID, home spironolactone 12.5 daily  HFrEF: Continue home carvedilol 25 daily and anti-hypertensives per above, restart home Lasix Tobacco use: Continue home nicotine patch daily  FEN/GI: Regular PPx: Lovenox Dispo: Home versus tomorrow with outpatient RadOnc and Palliative follow up  Subjective:  This morning, patient states that he is now not having as many "visions" in his visual.  Continues to complain of intermittent hiccups and some eye watering.  Denies headache.  Pain is minimal overall.  Otherwise doing better than he was yesterday.  States he is very happy because he has been NPO this morning.  Patient's grandson is present at bedside, states that patient likes to take the bus to his appointments and does occasionally miss appointments because of this.  States that patient is very independent and does not like to rely on others  for assistance.  Expresses concern that patient may need assistance with transportation to and from appointments.  Patient assents to palliative consult to discuss goals of care and obtain further  resources.  Objective: Temp:  [97.7 F (36.5 C)-98.3 F (36.8 C)] 98.3 F (36.8 C) (12/02 0110) Pulse Rate:  [59-69] 60 (12/02 0110) Resp:  [17-18] 18 (12/02 0110) BP: (128-154)/(42-56) 128/42 (12/02 0110) SpO2:  [98 %-100 %] 98 % (12/02 0110)  Physical Exam: General: Age-appropriate, resting comfortably in chair at bedside, NAD, WNWD, alert and at baseline. HEENT: Normocephalic, no diplopia.  PERRLA. Cardiovascular: Regular rate and rhythm. Normal S1/S2. No murmurs, rubs, or gallops appreciated. 2+ radial pulses. Pulmonary: Clear bilaterally to ascultation. No increased WOB, no accessory muscle usage on room air. No wheezes, rales, or crackles. Skin: Warm and dry. Extremities: No peripheral edema bilaterally. Neuro: No focal neurological deficit, able to ambulate several steps with only mild stagger.  Laboratory: Most recent CBC Lab Results  Component Value Date   WBC 5.4 11/13/2023   HGB 12.1 (L) 11/13/2023   HCT 36.3 (L) 11/13/2023   MCV 87.7 11/13/2023   PLT 191 11/13/2023   Most recent BMP    Latest Ref Rng & Units 11/13/2023    4:38 AM  BMP  Glucose 70 - 99 mg/dL 564   BUN 8 - 23 mg/dL 18   Creatinine 3.32 - 1.24 mg/dL 9.51   Sodium 884 - 166 mmol/L 140   Potassium 3.5 - 5.1 mmol/L 4.1   Chloride 98 - 111 mmol/L 114   CO2 22 - 32 mmol/L 22   Calcium 8.9 - 10.3 mg/dL 9.0     Other pertinent labs: -None  New Imaging/Diagnostic Tests: -None  Juan Messing, MD 11/15/2023, 7:20 AM  PGY-1, [facility] FPTS Intern pager: [phone], text pages welcome Secure chat group [facility] Teaching Service

## 2023-11-16 ENCOUNTER — Ambulatory Visit
Admission: RE | Admit: 2023-11-16 | Discharge: 2023-11-16 | Disposition: A | Discharge status: Home / Self Care | Payer: Medicare PPO | Source: Ambulatory Visit | Attending: Radiation Oncology | Admitting: Radiation Oncology

## 2023-11-16 ENCOUNTER — Ambulatory Visit
Admit: 2023-11-16 | Discharge: 2023-11-16 | Disposition: A | Discharge status: Home / Self Care | Payer: Medicare PPO | Attending: Radiation Oncology | Admitting: Radiation Oncology

## 2023-11-16 ENCOUNTER — Other Ambulatory Visit: Payer: Self-pay | Admitting: Oncology

## 2023-11-16 ENCOUNTER — Other Ambulatory Visit (HOSPITAL_COMMUNITY): Payer: Self-pay

## 2023-11-16 DIAGNOSIS — C3431 Malignant neoplasm of lower lobe, right bronchus or lung: Secondary | ICD-10-CM

## 2023-11-16 DIAGNOSIS — Z510 Encounter for antineoplastic radiation therapy: Secondary | ICD-10-CM | POA: Insufficient documentation

## 2023-11-16 DIAGNOSIS — C7931 Secondary malignant neoplasm of brain: Secondary | ICD-10-CM

## 2023-11-16 LAB — GLUCOSE, CAPILLARY
Glucose-Capillary: 222 mg/dL — ABNORMAL HIGH (ref 70–99)
Glucose-Capillary: 209 mg/dL — ABNORMAL HIGH (ref 70–99)
Glucose-Capillary: 168 mg/dL — ABNORMAL HIGH (ref 70–99)

## 2023-11-16 MED ORDER — METOCLOPRAMIDE HCL 5 MG PO TABS
5.0000 mg | ORAL_TABLET | Freq: Three times a day (TID) | ORAL | 0 refills | Status: DC | PRN
  Filled 2023-11-16: qty 30, 10d supply, fill #0

## 2023-11-16 MED ORDER — ACETAMINOPHEN 500 MG PO TABS: 1000.0000 mg | ORAL_TABLET | Freq: Four times a day (QID) | ORAL | Status: DC

## 2023-11-16 MED ORDER — METOCLOPRAMIDE HCL 5 MG PO TABS
5.0000 mg | ORAL_TABLET | Freq: Three times a day (TID) | ORAL | Status: DC
  Filled 2023-11-16: qty 1

## 2023-11-16 MED ORDER — METFORMIN HCL 500 MG PO TABS
500.0000 mg | ORAL_TABLET | Freq: Two times a day (BID) | ORAL | 0 refills | Status: DC
  Filled 2023-11-16: qty 60, 30d supply, fill #0

## 2023-11-16 MED ORDER — DEXAMETHASONE 4 MG PO TABS
4.0000 mg | ORAL_TABLET | Freq: Three times a day (TID) | ORAL | 1 refills | Status: DC
  Filled 2023-11-16: qty 90, 30d supply, fill #0

## 2023-11-16 MED ORDER — OXYCODONE HCL 5 MG PO TABS
2.5000 mg | ORAL_TABLET | Freq: Four times a day (QID) | ORAL | Status: DC | PRN
  Administered 2023-11-16: 2.5 mg via ORAL
  Filled 2023-11-16: qty 1

## 2023-11-16 NOTE — Plan of Care (Signed)
IR was requested for image guided liver lesion bx on 12/1.    Case was reviewed by IR radiologists, MR abdomen was recommended to r/o hemangioma. MR performed on 12/2, the liver lesions looks benign. IR was requested for possible lung bx. Case reviewed with Dr. Lowella Dandy, patient is at very high risk of post lung bx complications due to emphysema. Recommends pulmonary consult for possible bronch and PET.    Ordering provider notified.   Will delete the liver lesion bx order.  Please call IR for questions and concerns.   Lynann Bologna Doreene Forrey PA-C 11/16/2023 11:44 AM

## 2023-11-16 NOTE — Progress Notes (Addendum)
Palliative:  Discussed with attending team. Plans for discharge following radiation oncology visit. I am unable to visit with Juan Norris prior to discharge. Referral placed to outpatient palliative provider Mayra Reel, NP at [facility] for palliative visit and goals of care conversations.   No charge  Yong Channel, NP Palliative Medicine Team Pager [phone] (Please see [email or web address] for schedule) Team Phone [phone]

## 2023-11-16 NOTE — TOC Progression Note (Signed)
Transition of Care [facility]) - Progression Note    Patient Details  Name: Juan Norris MRN: [id number] Date of Birth: Nov 11, 1951  Transition of Care [facility]) CM/SW Contact  Braven Wolk A [place], [place] Phone Number: 11/16/2023, 4:18 PM  Clinical Narrative:      CSW met with pt at bedside to assist with transportation set up with [employer] [facility]. Completed form sent to [employer] [facility]. Pt stated that he would also reach out to the [place] to possibly assist with providing transportation.   No other needs identified at this time. TOC will sign off, please consult again if TOC needs arise.        Expected Discharge Plan and Services         Expected Discharge Date: 11/16/23                                     Social Determinants of Health (SDOH) Interventions SDOH Screenings   Food Insecurity: No Food Insecurity (11/12/2023)  Housing: Low Risk  (11/12/2023)  Transportation Needs: No Transportation Needs (11/12/2023)  Utilities: Not At Risk (11/12/2023)  Tobacco Use: High Risk (11/15/2023)    Readmission Risk Interventions     No data to display

## 2023-11-16 NOTE — Discharge Summary (Addendum)
Family Medicine Teaching [facility] Discharge Summary  Patient name: Juan Norris Medical record number: [id number] Date of birth: 1951-12-13 Age: 72 y.o. 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 His hospital course is detailed below:  Brain Metastasis  Diplopia  Dizziness Worsening diplopia, dizziness, falls x 1 week.  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  Neurosurgery consulted and recommended no surgical intervention, and recommended oncology discussion.  Oncology consulted and recommended Decadron 4 mg every 8 hours and CT chest abdomen pelvis.  Imaging showed new liver nodules as well as new pulmonary nodules in addition to previous stable lung nodule.  IR and radiation oncology consulted for further management and biopsy.  MR abdomen and head was able to be performed in setting of ICD. MRI demonstrated liver nodules which were favored to benign etiology while brain masses suspected to be metastasis, and showed additional mass effect. Neurosurgery consulted for  mass effect seen on MRI. Per Radiation Oncology, they recommended immediate treatment. Patient received stereotactic radiosurgery CT simulation/therapy at [facility] before discharge. IR placed Port-A-Cath prior to discharge.  Palliative Care and TOC were consulted for outpatient follow up and transportation assistance, respectively.  Patient was discharged on 4 mg Decadron 4 times daily and PET/CT scheduled to evaluate for best way to conduct biopsy of lung versus brain.  Other chronic conditions were medically managed with home medications and formulary alternatives as necessary (HLD, CAD, HTN, HFrEF, tobacco use)  PCP Follow-up Recommendations: Continue Decadron 4 mg orally 3 times daily at the time of hospital discharge Restart ARNI as indicated, held at discharge. Transitions of Care consulted for transportation assistance to appointments, patient applied for SCAT also instructed to reach out to [place] for transportation assistance. Aspirin continued at discharge, will need to be held for 7 days prior to potential biopsy.  Discharged on Metformin, consider Glipizide for continue management  Disposition: home  Discharge Condition: stable  Discharge Exam:  Vitals:   11/16/23 0741 11/16/23 1556  BP: (!) 139/44 (!) 152/63  Pulse: (!) 59 (!) 59  Resp: 18 18  Temp: 98.7 F (37.1 C) 98 F (36.7 C)  SpO2: 99% 100%   Physical Exam: General: Age-appropriate, resting comfortably in chair at bedside, NAD, WNWD, alert and at baseline. HEENT: Normocephalic, atraumatic. Cardiovascular: Regular rate and rhythm. Normal S1/S2. No murmurs, rubs, or gallops appreciated. 2+ radial pulses. Pulmonary: Clear bilaterally to ascultation. No increased WOB, no accessory muscle usage on room air. No wheezes, rales, or crackles. Skin: Port-A-Cath site covered with dressing, dry, and not draining. Extremities: No peripheral edema bilaterally. Neuro:  No coordination difficulties, alert and oriented x4, no focal  neurological deficit.  Significant Procedures: Stereotactic Radiation Therapy 11/16/23  Significant Labs and Imaging:  No results for input(s): "WBC", "HGB", "HCT", "PLT" in the last 48 hours. Recent Labs  Lab 11/15/23 0758  NA 138  K 4.2  CL 107  CO2 22  GLUCOSE 177*  BUN 18  CREATININE 1.07  CALCIUM 9.3    -MR abdomen w/ and w/o contrast: Imaging characteristics strongly favor incidental, benign focal nodular hyperplasia rather than hepatic metastasis, however close attention on follow-up is warranted in the setting of known metastatic malignancy. -MR brain w/ and w/o contrast: Partially enhancing cystic masses in L occipital lobe and R cerebellum c/w metastatic disease.  Mass effect on fourth ventricle, right pons, right medulla.  No hydrocephalus. -CTA Head (11/29): Left occipital lobe mass and right posterior fossa mass consistent with metastatic disease   Results/Tests Pending at Time of Discharge: None  Discharge Medications:  Allergies as of 11/16/2023   No Known Allergies      Medication List     STOP taking these medications    Entresto 24-26 MG Generic drug: sacubitril-valsartan       TAKE these medications    aspirin EC 81 MG tablet Take 1 tablet (81 mg total) by mouth daily.   atorvastatin 40 MG tablet Commonly known as: LIPITOR Take 1 tablet (40 mg total) by mouth at bedtime.   carvedilol 25 MG tablet Commonly known as: COREG Take 12.5 mg by mouth daily.   dexamethasone 4 MG tablet Commonly known as: DECADRON Take 1 tablet (4 mg total) by mouth every 8 (eight) hours.   furosemide 20 MG tablet Commonly known as: LASIX Take 1 tablet (20 mg total) by mouth daily. As directed   metFORMIN 500 MG tablet Commonly known as: GLUCOPHAGE Take 1 tablet (500 mg total) by mouth 2 (two) times daily with a meal.   metoCLOPramide 5 MG tablet Commonly known as: REGLAN Take 1 tablet (5 mg total) by mouth every 8 (eight) hours as needed for nausea.    multivitamin with minerals Tabs tablet Take 1 tablet by mouth daily.   nicotine 21 mg/24hr patch Commonly known as: NICODERM CQ - dosed in mg/24 hours Place 21 mg onto the skin daily. Last patch was applied 3-4 days ago, no patch is on today 11/12/2023.   spironolactone 25 MG tablet Commonly known as: ALDACTONE Take 0.5 tablets (12.5 mg total) by mouth daily.        Discharge Instructions: Please refer to Patient Instructions section of EMR for full details.  Patient was counseled important signs and symptoms that should prompt return to medical care, changes in medications, dietary instructions, activity restrictions, and follow up appointments.   Follow-Up Appointments: Future Appointments  Date Time Provider Department Center  11/19/2023 11:30 AM Margaretmary Dys, MD [facility] None  11/22/2023 11:15 AM Margaretmary Dys, MD [facility] None  11/24/2023  1:55 PM Margaretmary Dys, MD [facility] None  02/09/2024  8:45 AM [facility] DEVICE REMOTES [facility]  05/10/2024  8:45 AM [facility] DEVICE REMOTES [facility]    Sharion Dove, Dimitry, MD 11/16/2023, 4:12 PM PGY-1, [facility]  I have verified that the resident's  findings are accurately documented in the resident's note. I have made edits and changes where appropriate, and agree with plan.  Celine Mans, MD, PGY-2 [facility] 4:12 PM 11/16/2023

## 2023-11-16 NOTE — Assessment & Plan Note (Addendum)
Still endorses some headache, discomfort, but dizziness and diplopia improved.  Undergoing evaluation with specialists. -Pain regimen: Tylenol 1000 Q6h scheduled, oxycodone 2.5 mg Q6h PRN for Port-A-Cath site pain -Metoclopramide scheduled TID for hiccups -PT/OT with patient -Falls precautions -Med Onc following, appreciate recommendations for PET/CT outpatient, continue decadron 4 mg TID at discharge -Radiation Oncology consulted for outpatient radiation and performing at [facility] today prior to discharge -Palliative consulted yesterday, follow up recommendations -Neurosurgery consulted, follow up recommendations for mass effect

## 2023-11-16 NOTE — Consult Note (Signed)
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 Dizziness  R42     2. Diplopia  H53.2     3. Brain mass  G93.89     4. Metastatic cancer to brain [facility])  C79.31       HISTORY OF PRESENT ILLNESS: Juan Norris is a 72 y.o. male seen at the request of Dr. Yetta Barre.  He has a history of stage Ia NSCLC, squamous cell lung cancer diagnosed on biopsy 10/13/2021 and treated with 5 fraction SBRT to the RLL nodule under the care of Dr. Roselind Messier, between 11/03/2021 and 11/14/2021.  Follow-up CT chest imaging has subsequently remained stable without evidence of progression or recurrence through April 2023 when he last saw Dr. Roselind Messier.  The plan at that time was for a follow-up scan in 6 months but he was lost to follow-up at the cancer center there after but apparently was being followed with serial CT chest scans with Dr. Delton See, in medical oncology at the [facility].  Reportedly, his scans had remained without evidence of disease recurrence or progression as of his last scan in August 2024.  Recently, he presented to the emergency department on 11/12/2023 with complaints of diplopia and dizziness for 2 days.  A CT angio head and neck was performed on admission and showed masses in the left occipital lobe and right posterior fossa with significant edema and mass effect with effacement of the fourth ventricle.  Also noted was new nodularity and pleural thickening in the left upper lobe lung.  He was started on dexamethasone 4 mg p.o. 3 times daily and has had resolution of the diplopia and dizziness since starting the steroids.  A  CT C/A/P was performed on 11/13/2023 and showed a stable, smaller appearance of the treated RLL nodule but a new 1.3 cm LUL nodule and a 5 mm RUL nodule.  Additionally, a 2 cm enhancing liver lesion was noted.  A CT head showed a 2.6 cm peripherally enhancing mass in the left occipital cortex and a 3.5 cm enhancing mass in the inferior right cerebellum.  MRI abdomen was performed on 11/15/2023 for further evaluation of the liver lesion and suggests this lesion is incidental, benign focal nodular hyperplasia rather than a metastasis.  An MRI brain scan on 11/15/2023 confirmed peripherally enhancing, partially cystic masses in the left occipital lobe and right cerebellum, consistent with metastatic disease. The right cerebellar mass is associated with mass effect on the fourth ventricle, right pons, and right medulla. No hydrocephalus. Dr. Yetta Barre, of neurosurgery, was consulted and recommended fractionated stereotactic radiosurgery as opposed to surgical resection.  Therefore, we have been asked to consult the patient to discuss the potential role for stereotactic radiosurgery [facility]) treatment of the metastatic brain disease.  The patient is doing well and is likely to be discharged home later today.  He will need a PET/CT in the outpatient setting, to complete his disease staging and will likely need biopsy of the new lung lesions for tissue confirmation and updated molecular profile for treatment planning purposes.  Further workup/management of his systemic disease will need to be coordinated  with his medical oncologist, Dr. Delton See, at the [facility], whom he will see in the outpatient setting.    PREVIOUS RADIATION THERAPY: Yes  11/03/2021 through 11/14/2021 Site Technique Total Dose (Gy) Dose per Fx (Gy) Completed Fx Beam Energies  Lung, Right: Lung_Rt IMRT 60/60 12 5/5 6XFFF    PAST MEDICAL HISTORY:  Past Medical History:  Diagnosis Date   Acute renal insufficiency    Anginal pain (HCC)     Chronic lower back pain    "due to 3 ruptured discs in my back" (11/08/2018)   High cholesterol    History of kidney stones 1982   "passed them"   History of radiation therapy    Right lung- 11/03/21-11/14/21- Dr. Joylene Draft   Hypertension    Lung cancer [facility])    Myocardial infarction (HCC) 03/2018   Systolic heart failure (HCC)    Tobacco use       PAST SURGICAL HISTORY: Past Surgical History:  Procedure Laterality Date   BIV ICD INSERTION CRT-D N/A 11/08/2018    Med[employer]onic biventricular ICD implantation by Dr Johney Frame with His Bundle pacing and a MDT 3830 lead placed into the LV port.    BRONCHIAL BIOPSY  10/13/2021   Procedure: BRONCHIAL BIOPSIES;  Surgeon: Leslye Peer, MD;  Location: [facility] ENDOSCOPY;  Service: Pulmonary;;   BRONCHIAL BRUSHINGS  10/13/2021   Procedure: BRONCHIAL BRUSHINGS;  Surgeon: Leslye Peer, MD;  Location: [facility] ENDOSCOPY;  Service: Pulmonary;;   BRONCHIAL NEEDLE ASPIRATION BIOPSY  10/13/2021   Procedure: BRONCHIAL NEEDLE ASPIRATION BIOPSIES;  Surgeon: Leslye Peer, MD;  Location: [facility] ENDOSCOPY;  Service: Pulmonary;;   BRONCHIAL WASHINGS  10/13/2021   Procedure: BRONCHIAL WASHINGS;  Surgeon: Leslye Peer, MD;  Location: [facility] ENDOSCOPY;  Service: Pulmonary;;   CARDIAC CATHETERIZATION     FIDUCIAL MARKER PLACEMENT  10/13/2021   Procedure: FIDUCIAL MARKER PLACEMENT;  Surgeon: Leslye Peer, MD;  Location: [facility] ENDOSCOPY;  Service: Pulmonary;;   INGUINAL HERNIA REPAIR Bilateral    IR IMAGING GUIDED PORT INSERTION  11/15/2023   KNEE SURGERY Left 1990s   "growth on the back of my knee removed"   LEFT HEART CATH AND CORONARY ANGIOGRAPHY N/A 03/30/2018   Procedure: LEFT HEART CATH AND CORONARY ANGIOGRAPHY;  Surgeon: Kathleene Hazel, MD;  Location: [facility] INVASIVE CV LAB;  Service: Cardiovascular;  Laterality: N/A;   VIDEO BRONCHOSCOPY WITH ENDOBRONCHIAL NAVIGATION N/A 10/13/2021   Procedure: ROBOTIC VIDEO BRONCHOSCOPY WITH ENDOBRONCHIAL NAVIGATION;  Surgeon:  Leslye Peer, MD;  Location: [facility] ENDOSCOPY;  Service: Pulmonary;  Laterality: N/A;   VIDEO BRONCHOSCOPY WITH RADIAL ENDOBRONCHIAL ULTRASOUND  10/13/2021   Procedure: RADIAL ENDOBRONCHIAL ULTRASOUND;  Surgeon: Leslye Peer, MD;  Location: [facility] ENDOSCOPY;  Service: Pulmonary;;    FAMILY HISTORY:  Family History  Problem Relation Age of Onset   Leukemia Mother    Cancer Father    Cancer Sister    Cancer Brother    CAD Brother     SOCIAL HISTORY:  Social History   Socioeconomic History   Marital status: Divorced    Spouse name: Not on file   Number of children: 1   Years of education: Not on file   Highest education level: Not on file  Occupational History   Not on file  Tobacco Use   Smoking status: Every Day    Current packs/day: 1.50    Average packs/day: 1.5 packs/day for 47.0 years (70.5 ttl pk-yrs)    Types: Cigarettes   Smokeless tobacco: Never  Tobacco comments:    1 cigarette smoked daily 05/28/22 [facility]   Vaping Use   Vaping status: Never Used  Substance and Sexual Activity   Alcohol use: Not Currently    Comment: 11/08/2018 "stopped 09/23/1998"   Drug use: Not Currently    Comment: 11/08/2018 "nothing in the 2000s"   Sexual activity: Yes  Other Topics Concern   Not on file  Social History Narrative   Retired   International aid/development worker of [employer] Strain: Not on file  Food Insecurity: No Food Insecurity (11/12/2023)   Hunger Vital Sign    Worried About Running Out of Food in the Last Year: Never true    Ran Out of Food in the Last Year: Never true  Transportation Needs: No Transportation Needs (11/12/2023)   PRAPARE - Administrator, Civil Service (Medical): No    Lack of Transportation (Non-Medical): No  Physical Activity: Not on file  Stress: Not on file  Social Connections: Not on file  Intimate Partner Violence: Not At Risk (11/12/2023)   Humiliation, Afraid, Rape, and Kick questionnaire    Fear of Current or  Ex-Partner: No    Emotionally Abused: No    Physically Abused: No    Sexually Abused: No    ALLERGIES: Patient has no known allergies.  MEDICATIONS:  Current Facility-Administered Medications  Medication Dose Route Frequency Provider Last Rate Last Admin   acetaminophen (TYLENOL) tablet 1,000 mg  1,000 mg Oral Q6H Baloch, Mahnoor, MD       atorvastatin (LIPITOR) tablet 40 mg  40 mg Oral QHS Ivery Quale, MD   40 mg at 11/15/23 2119   carvedilol (COREG) tablet 12.5 mg  12.5 mg Oral Daily Ivery Quale, MD   12.5 mg at 11/16/23 0851   dexamethasone (DECADRON) tablet 4 mg  4 mg Oral Q8H Elberta Fortis, MD   4 mg at 11/16/23 0544   enoxaparin (LOVENOX) injection 40 mg  40 mg Subcutaneous Q24H Ivery Quale, MD   40 mg at 11/15/23 1715   furosemide (LASIX) tablet 20 mg  20 mg Oral Daily Baloch, Mahnoor, MD   20 mg at 11/16/23 0851   insulin aspart (novoLOG) injection 0-6 Units  0-6 Units Subcutaneous TID WC Westley Chandler, MD   2 Units at 11/16/23 0851   metoCLOPramide (REGLAN) tablet 5 mg  5 mg Oral TID Georg Ruddle, Mahnoor, MD       multivitamin with minerals tablet 1 tablet  1 tablet Oral Daily Ivery Quale, MD   1 tablet at 11/16/23 0851   nicotine (NICODERM CQ - dosed in mg/24 hours) patch 21 mg  21 mg Transdermal Daily Ivery Quale, MD   21 mg at 11/16/23 8469   Oral care mouth rinse  15 mL Mouth Rinse PRN Westley Chandler, MD       oxyCODONE (Oxy IR/ROXICODONE) immediate release tablet 2.5 mg  2.5 mg Oral Q6H PRN Baloch, Mahnoor, MD   2.5 mg at 11/16/23 1111   polyethylene glycol (MIRALAX / GLYCOLAX) packet 17 g  17 g Oral BID Westley Chandler, MD       spironolactone (ALDACTONE) tablet 12.5 mg  12.5 mg Oral Daily Ivery Quale, MD   12.5 mg at 11/16/23 6295    REVIEW OF SYSTEMS:  On review of systems, the patient reports that he is doing well overall.  He reports complete resolution of the diplopia and dizziness that brought him into the hospital, since starting the Decadron.  He  denies any chest  pain, shortness of breath, cough, fevers, chills, night sweats, or recent unintended weight changes.  He denies any bowel or bladder disturbances, and denies abdominal pain, nausea or vomiting.  He denies any new musculoskeletal or joint aches or pains. A complete review of systems is obtained and is otherwise negative.    PHYSICAL EXAM:  Wt Readings from Last 3 Encounters:  11/12/23 147 lb 11.3 oz (67 kg)  11/12/23 147 lb 11.3 oz (67 kg)  06/04/23 147 lb 9.6 oz (67 kg)   Temp Readings from Last 3 Encounters:  11/16/23 98.7 F (37.1 C)  11/12/23 98 F (36.7 C) (Temporal)  11/01/23 (!) 97.5 F (36.4 C) (Oral)   BP Readings from Last 3 Encounters:  11/16/23 (!) 139/44  11/12/23 (!) 177/64  11/01/23 122/67   Pulse Readings from Last 3 Encounters:  11/16/23 (!) 59  11/12/23 71  11/01/23 62   Pain Assessment Pain Score: 0-No pain/10  Unable to assess due to telephone consult visit format.  KPS = 70  100 - Normal; no complaints; no evidence of disease. 90   - Able to carry on normal activity; minor signs or symptoms of disease. 80   - Normal activity with effort; some signs or symptoms of disease. 91   - Cares for self; unable to carry on normal activity or to do active work. 60   - Requires occasional assistance, but is able to care for most of his personal needs. 50   - Requires considerable assistance and frequent medical care. 40   - Disabled; requires special care and assistance. 30   - Severely disabled; hospital admission is indicated although death not imminent. 20   - Very sick; hospital admission necessary; active supportive treatment necessary. 10   - Moribund; fatal processes progressing rapidly. 0     - Dead  Karnofsky DA, Abelmann WH, Craver LS and Burchenal JH [phone]) The use of the nitrogen mustards in the palliative treatment of carcinoma: with particular reference to bronchogenic carcinoma Cancer 1 634-56  LABORATORY DATA:  Lab Results  Component Value  Date   WBC 5.4 11/13/2023   HGB 12.1 (L) 11/13/2023   HCT 36.3 (L) 11/13/2023   MCV 87.7 11/13/2023   PLT 191 11/13/2023   Lab Results  Component Value Date   NA 138 11/15/2023   K 4.2 11/15/2023   CL 107 11/15/2023   CO2 22 11/15/2023   Lab Results  Component Value Date   ALT 28 11/12/2023   AST 28 11/12/2023   ALKPHOS 73 11/12/2023   BILITOT 0.7 11/12/2023     RADIOGRAPHY: MR ABDOMEN W WO CONTRAST  Result Date: 11/15/2023 CLINICAL DATA:  Characterize right lobe of the liver lesion, history of metastatic lung cancer EXAM: MRI ABDOMEN WITHOUT AND WITH CONTRAST TECHNIQUE: Multiplanar multisequence MR imaging of the abdomen was performed both before and after the administration of intravenous contrast. CONTRAST:  7mL GADAVIST GADOBUTROL 1 MMOL/ML IV SOLN COMPARISON:  CT chest abdomen pelvis, 11/13/2023 FINDINGS: Lower chest: No acute abnormality.  Cardiomegaly. Hepatobiliary: Lesion in anterior hepatic segment IV measuring 2.2 x 1.9 cm, with transient early arterial hyperenhancement and near isoenhancement on remaining contrast phases (series 11, image 35). Minimal if any underlying diffusion restriction (series 6, image 84). Similar lesion with early arterial hyperenhancement in the posterior liver dome, hepatic segment VII, measuring 1.6 x 0.9 cm (series 11, image 18). No gallstones. Mild dilatation of the common bile duct measuring up to 0.9 cm in  caliber, without calculus or other obstruction identified to the ampulla, this appearance unchanged compared prior examinations. Pancreas: Unremarkable. No pancreatic ductal dilatation or surrounding inflammatory changes. Spleen: Normal in size without significant abnormality. Adrenals/Urinary Tract: Adrenal glands are unremarkable. Simple, benign bilateral renal cortical cysts, for which no further follow-up or characterization required. Kidneys are otherwise normal, without obvious renal calculi, solid lesion, or hydronephrosis. Stomach/Bowel:  Stomach is within normal limits. No evidence of bowel wall thickening, distention, or inflammatory changes. Vascular/Lymphatic: Aortic atherosclerosis. No enlarged abdominal lymph nodes. Other: No abdominal wall hernia or abnormality. No ascites. Musculoskeletal: No acute or significant osseous findings. IMPRESSION: 1. Lesion in anterior hepatic segment IV measuring 2.2 x 1.9 cm, with transient early arterial hyperenhancement and near isoenhancement on remaining contrast phases. Minimal if any underlying diffusion restriction. Similar lesion with early arterial hyperenhancement in the posterior liver dome, hepatic segment VII, measuring 1.6 x 0.9 cm. Imaging characteristics strongly favor incidental, benign focal nodular hyperplasia rather than hepatic metastasis, however close attention on follow-up is warranted in the setting of known metastatic malignancy. 2. Mild dilatation of the common bile duct measuring up to 0.9 cm in caliber, without calculus or other obstruction identified to the ampulla, this appearance unchanged compared prior examinations and most likely benign. 3. Cardiomegaly. Aortic Atherosclerosis (ICD10-I70.0). Electronically Signed   By: Jearld Lesch M.D.   On: 11/15/2023 18:45   MR BRAIN W WO CONTRAST  Result Date: 11/15/2023 CLINICAL DATA:  Right lung squamous cell carcinoma, double vision and dizziness, metastatic disease evaluation EXAM: MRI HEAD WITHOUT AND WITH CONTRAST TECHNIQUE: Multiplanar, multiecho pulse sequences of the brain and surrounding structures were obtained without and with intravenous contrast. CONTRAST:  7mL GADAVIST GADOBUTROL 1 MMOL/ML IV SOLN COMPARISON:  11/13/2023 CT head, 09/01/2021 MRI head FINDINGS: Brain: Peripherally enhancing, partially cystic mass in the left occipital lobe measures up to 2.6 x 2.6 x 2.1 cm (AP x TR x CC) (series 18, image 62 and series 100, image 211). This mass associated with surrounding T2 hyperintense signal, likely edema, with mild  mass effect on the left occipital horn. Additional peripherally enhancing, partially cystic mass in the right cerebellum, which measures up to 3.0 x 3.3 x 2.2 cm (AP x TR x CC) (series 18, image 39 and series 100, image 165). This mass is also so seated with surrounding T2 hyperintense signal, likely edema, with mass effect on the fourth ventricle, right pons, and right medulla. No restricted diffusion to suggest acute or subacute infarct. No acute hemorrhage or midline shift. No hydrocephalus or extra-axial collection. Pituitary and craniocervical junction within normal limits. Some hemosiderin deposition is associated with the aforementioned masses. No other hemosiderin deposition to suggest remote hemorrhage. Normal cerebral volume for age. 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 Scattered T2 hyperintense signal in the periventricular white matter, likely the sequela of mild chronic small vessel ischemic disease. Vascular: Normal arterial flow voids. Normal arterial and venous enhancement. Skull and upper cervical spine: Normal marrow signal. Sinuses/Orbits: Left maxillary mucous retention cyst. Otherwise overall clear paranasal sinuses. No acute finding in the orbits. Other: The mastoid air cells are well aerated. IMPRESSION: Peripherally enhancing, partially cystic masses in the left occipital lobe and right cerebellum, consistent with metastatic disease. The right cerebellar mass is associated with mass effect on the fourth ventricle, right pons, and right medulla. No hydrocephalus. Electronically Signed   By: Wiliam Ke M.D.   On: 11/15/2023 14:52   IR IMAGING GUIDED PORT INSERTION  Result Date: 11/15/2023 CLINICAL  DATA:  Metastatic lung cancer EXAM: RIGHT INTERNAL JUGULAR SINGLE LUMEN POWER PORT CATHETER INSERTION Date:  11/15/2023 11/15/2023 11:11 am Radiologist:  M. Ruel Favors, MD Guidance:  Ultrasound fluoroscopic MEDICATIONS: 1% lidocaine local with epinephrine ANESTHESIA/SEDATION: Versed  1.5 mg IV; Fentanyl 50 mcg IV; Moderate Sedation Time:  23 minute The patient was continuously monitored during the procedure by the interventional radiology nurse under my direct supervision. FLUOROSCOPY: 0 minutes, 54 seconds (6 mGy) COMPLICATIONS: None immediate. CONTRAST:  None. PROCEDURE: Informed consent was obtained from the patient following explanation of the procedure, risks, benefits and alternatives. The patient understands, agrees and consents for the procedure. All questions were addressed. A time out was performed. Maximal barrier sterile technique utilized including caps, mask, sterile gowns, sterile gloves, large sterile drape, hand hygiene, and 2% chlorhexidine scrub. Under sterile conditions and local anesthesia, right internal jugular micropuncture venous access was performed. Access was performed with ultrasound. Images were obtained for documentation of the patent right internal jugular vein. A guide wire was inserted followed by a transitional dilator. This allowed insertion of a guide wire and catheter into the IVC. Measurements were obtained from the SVC / RA junction back to the right IJ venotomy site. In the right infraclavicular chest, a subcutaneous pocket was created over the second anterior rib. This was done under sterile conditions and local anesthesia. 1% lidocaine with epinephrine was utilized for this. A 2.5 cm incision was made in the skin. Blunt dissection was performed to create a subcutaneous pocket over the right pectoralis major muscle. The pocket was flushed with saline vigorously. There was adequate hemostasis. The port catheter was assembled and checked for leakage. The port catheter was secured in the pocket with two retention sutures. The tubing was tunneled subcutaneously to the right venotomy site and inserted into the SVC/RA junction through a valved peel-away sheath. Position was confirmed with fluoroscopy. Images were obtained for documentation. The patient  tolerated the procedure well. No immediate complications. Incisions were closed in a two layer fashion with 4 - 0 Vicryl suture. Dermabond was applied to the skin. The port catheter was accessed, blood was aspirated followed by saline and heparin flushes. Needle was removed. A dry sterile dressing was applied. IMPRESSION: Ultrasound and fluoroscopically guided right internal jugular single lumen power port catheter insertion. Tip in the SVC/RA junction. Catheter ready for use. Electronically Signed   By: Judie Petit.  Shick M.D.   On: 11/15/2023 12:26   US Abdomen Limited RUQ (LIVER/GB)  Result Date: 11/14/2023 CLINICAL DATA:  Liver lesion. EXAM: ULTRASOUND ABDOMEN LIMITED RIGHT UPPER QUADRANT COMPARISON:  CT 11/13/2023. FINDINGS: Gallbladder: No gallstones or wall thickening visualized. No sonographic Murphy sign noted by sonographer. Common bile duct: Diameter: 4 mm Liver: Echogenic hepatic parenchyma consistent with fatty infiltration. There is hypoechoic areas seen in the liver measuring 17 mm. Portal vein is patent on color Doppler imaging with normal direction of blood flow towards the liver. Other: None. IMPRESSION: No gallstones or ductal dilatation. Fatty liver infiltration. 17 mm hypoechoic area seen in the liver. Indeterminate lesion by ultrasound. By CT this could be a hemangioma. Recommend dedicated MRI with and without contrast with dynamic exam when clinically appropriate to confirm etiology Electronically Signed   By: Karen Kays M.D.   On: 11/14/2023 13:28   CT HEAD W & WO CONTRAST ( )  Result Date: 11/14/2023 CLINICAL DATA:  Initial evaluation for brain/CNS neoplasm, monitor. EXAM: CT HEAD WITHOUT AND WITH CONTRAST TECHNIQUE: Contiguous axial images were obtained from the base of  the skull through the vertex without and with intravenous contrast. RADIATION DOSE REDUCTION: This exam was performed according to the departmental dose-optimization program which includes automated exposure control,  adjustment of the mA and/or kV according to patient size and/or use of iterative reconstruction technique. CONTRAST:  75mL OMNIPAQUE IOHEXOL 350 MG/ML SOLN COMPARISON:  Prior CT from 11/12/2023. FINDINGS: Brain: Partially cystic peripherally enhancing mass centered at the left occipital cortex measures 2.4 x 2.3 x 2.6 cm (series 9, image 15). Surrounding vasogenic edema without significant regional mass effect. Additional partially cystic heterogeneous peripherally enhancing mass positioned at the inferior right cerebellar hemisphere measures 3.5 x 2.3 x 3.2 cm (series 9, image 22). Surrounding edema with regional mass effect within the right posterior fossa. Partial effacement of the fourth ventricle and fourth ventricular outflow tract. No hydrocephalus. Basilar cisterns are mildly crowded but remain patent. These lesions demonstrate some intrinsic hyperdensity, which could reflect blood products. 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 No other visible mass lesion or abnormal enhancement. No other acute intracranial hemorrhage. No acute cortically based infarct. No extra-axial fluid collection. Small remote left cerebellar infarct noted. Vascular: No abnormal hyperdense vessel seen prior to contrast administration. Normal intravascular enhancement seen throughout the major intracranial vascular structures following contrast administration. Skull: Scalp soft tissues within normal limits. Calvarium intact. No focal osseous lesions. Sinuses/Orbits: Globes and orbital soft tissues within normal limits. Mucosal thickening about the left greater than right maxillary sinuses. No significant mastoid effusion. Other: None. IMPRESSION: 1. 2.6 cm peripherally enhancing mass centered at the left occipital cortex, with additional 3.5 cm enhancing mass at the inferior right cerebellum. 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 2. Associated vasogenic edema and regional mass effect about  the right cerebellar lesion with partial effacement of the fourth ventricle. No hydrocephalus. 3. No other acute intracranial abnormality. Electronically Signed   By: Rise Mu M.D.   On: 11/14/2023 03:18   CT CHEST ABDOMEN PELVIS W CONTRAST  Result Date: 11/13/2023 CLINICAL DATA:  History of right lung cancer status post radiation with new suspected brain metastases. * Tracking Code: BO * EXAM: CT CHEST, ABDOMEN, AND PELVIS WITH CONTRAST TECHNIQUE: Multidetector CT imaging of the chest, abdomen and pelvis was performed following the standard protocol during bolus administration of intravenous contrast. RADIATION DOSE REDUCTION: This exam was performed according to the departmental dose-optimization program which includes automated exposure control, adjustment of the mA and/or kV according to patient size and/or use of iterative reconstruction technique. CONTRAST:  75mL OMNIPAQUE IOHEXOL 350 MG/ML SOLN COMPARISON:  CT chest dated 03/17/2022, CT abdomen and pelvis dated 05/08/2017 FINDINGS: CT CHEST FINDINGS Cardiovascular: Left anterior chest wall ICD with leads terminating in the right atrium and ventricle. Normal heart size. No significant pericardial fluid/thickening. Great vessels are normal in course and caliber. No central pulmonary emboli. Coronary artery calcifications. Mediastinum/Nodes: Imaged thyroid gland without nodules meeting criteria for imaging follow-up by size. Normal esophagus. No pathologically enlarged axillary, supraclavicular, mediastinal, or hilar lymph nodes. Lungs/Pleura: The central airways are patent. Severe upper lobe predominant centrilobular and paraseptal emphysema. New irregular peripheral left upper lobe nodule measuring 1.3 x 0.8 cm (5:38). New subpleural right upper lobe 5 x 2 mm nodule (5:44). Decreased size of subpleural medial right lower lobe nodule measuring 1.1 x 0.6 cm (5:77), previously 1.4 x 1.0 cm. No pneumothorax. No pleural effusion. Musculoskeletal:  No acute or abnormal lytic or blastic osseous lesions. Multilevel degenerative changes of the thoracic spine. CT ABDOMEN PELVIS FINDINGS Hepatobiliary: Segment 4  enhancing lesion measures 2.0 x 1.8 cm (3:57). No intra or extrahepatic biliary ductal dilation. Normal gallbladder. Pancreas: No focal lesions or main ductal dilation. Spleen: Normal in size without focal abnormality. Scattered calcified granulomata. Adrenals/Urinary Tract: No adrenal nodules. Left extrarenal pelvis. No suspicious renal mass on this noncontrast enhanced examination, calculi, or hydronephrosis. No focal bladder wall thickening. Stomach/Bowel: Normal appearance of the stomach. No evidence of bowel wall thickening, distention, or inflammatory changes. Normal appendix. Vascular/Lymphatic: Aortic atherosclerosis. No enlarged abdominal or pelvic lymph nodes. Reproductive: Prostate is unremarkable. Other: No free fluid, fluid collection, or free air. Musculoskeletal: No acute or abnormal lytic or blastic osseous findings. Postsurgical changes of the anterior abdominal wall. 3.8 x 1.3 cm lipoma in the right gluteal musculature. IMPRESSION: 1. New irregular peripheral left upper lobe pulmonary nodule measuring 1.3 x 0.8 cm and new subpleural right upper lobe 5 x 2 mm nodule, suspicious for pulmonary neoplasm, either primary or metastatic. 2. Continued decrease in size of treated subpleural medial right lower lobe pulmonary nodule measuring 1.1 x 0.6 cm, previously 1.4 x 1.0 cm. 3. Hepatic segment 4 enhancing lesion measures 2.0 x 1.8 cm, suspicious for metastatic disease. 4. Aortic Atherosclerosis (ICD10-I70.0) and Emphysema (ICD10-J43.9). Coronary artery calcifications. Assessment for potential risk factor modification, dietary therapy or pharmacologic therapy may be warranted, if clinically indicated. Electronically Signed   By: Agustin Cree M.D.   On: 11/13/2023 18:00   CT ANGIO HEAD NECK W WO CM  Result Date: 11/12/2023 CLINICAL DATA:   vertigo, double vision, R facial droop suspected intraparenchymal EXAM: CT ANGIOGRAPHY HEAD AND NECK WITH AND WITHOUT CONTRAST TECHNIQUE: Multidetector CT imaging of the head and neck was performed using the standard protocol during bolus administration of intravenous contrast. Multiplanar CT image reconstructions and MIPs were obtained to evaluate the vascular anatomy. Carotid stenosis measurements (when applicable) are obtained utilizing NASCET criteria, using the distal internal carotid diameter as the denominator. RADIATION DOSE REDUCTION: This exam was performed according to the departmental dose-optimization program which includes automated exposure control, adjustment of the mA and/or kV according to patient size and/or use of iterative reconstruction technique. CONTRAST:  75mL OMNIPAQUE IOHEXOL 350 MG/ML SOLN COMPARISON:  CT chest March 17, 2022.  MRI head September 01, 2021. FINDINGS: CT HEAD FINDINGS Brain: Suspected masses in the left occipital lobe and right posterior fossa, likely metastases. Substantial surrounding edema with mass effect with effacement of the fourth ventricle. No hydrocephalus. No definite superimposed acute large vascular territory infarct. No mass occupying acute hemorrhage. Vascular: No hyperdense vessel identified. Skull: No acute fracture. Sinuses/Orbits: No acute findings. Other: No mastoid effusions. Review of the MIP images confirms the above findings CTA NECK FINDINGS Aortic arch: Aortic atherosclerosis. Great vessel origins are patent. Right carotid system: Atherosclerosis at the carotid bifurcation without greater than 50% stenosis. Left carotid system: Atherosclerosis at the carotid bifurcation without greater than 50% stenosis. Irregularity of the left ICA at the skull base with mildly beaded appearance. Vertebral arteries: Co dominant. No significant (greater than 50%) stenosis. Skeleton: No acute abnormality on limited assessment. Lower cervical degenerative change.  Other neck: Approximally 3.1 x 2.5 cm cystic lesion subjacent to the strap musculature and abutting the hyoid and thyroid cartilage. Upper chest: Emphysema. New nodularity and pleural thickening in the left upper lobe, concerning for malignancy. Review of the MIP images confirms the above findings CTA HEAD FINDINGS Anterior circulation: Bilateral intracranial ICAs, MCAs, and ACAs are patent without proximal high-grade stenosis. Posterior circulation: Bilateral intradural vertebral arteries, basilar artery  and bilateral posterior cerebral arteries are patent without proximal high-grade stenosis. Venous sinuses: As permitted by contrast timing, patent. Review of the MIP images confirms the above findings IMPRESSION: 1. Suspected masses in the left occipital lobe and right posterior fossa, concerning for metastases given the clinical history. Recommend MRI with contrast to further evaluate. 2. Substantial surrounding edema with mass effect and effacement of the fourth ventricle. No hydrocephalus at this time. 3. New nodularity and pleural thickening in the left upper lobe, concerning for malignancy. Recommend dedicated CT of the chest with contrast to fully characterize. 4. No emergent large vessel occlusion or proximal high-grade stenosis. 5. 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 A follow-up CT of the neck with contrast is recommended to fully evaluate and help exclude carcinoma. 6. Mild irregularity of the left ICA at the skull base with beaded appearance. This could be secondary to fibromuscular dysplasia or atherosclerosis. 7. Aortic Atherosclerosis (ICD10-I70.0) and Emphysema (ICD10-J43.9). Urgent findings discussed with Dr. Jarold Motto via telephone at 12:18 p.m. Electronically Signed   By: Feliberto Harts M.D.   On: 11/12/2023 12:28   CUP PACEART REMOTE DEVICE  CHECK  Result Date: 11/10/2023 Scheduled remote reviewed. Normal device function.  Next remote 91 days. LA, CVRS  DG Lumbar Spine Complete  Result Date: 11/01/2023 CLINICAL DATA:  Pain EXAM: LUMBAR SPINE - COMPLETE 4+ VIEW COMPARISON:  08/21/2020 FINDINGS: Normal alignment. No fracture. Disc spaces maintained. Anterior degenerative spurring. Degenerative facet disease throughout the lumbar spine. No acute bony abnormality. IMPRESSION: No acute bony abnormality. Electronically Signed   By: Charlett Nose M.D.   On: 11/01/2023 03:39      IMPRESSION/PLAN: 1. 72 y.o. 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  At this point, the patient would potentially benefit from radiotherapy. The options include whole brain irradiation versus stereotactic radiosurgery. There are pros and cons associated with each of these potential treatment options. Whole brain radiotherapy would treat the known metastatic deposits and help provide some reduction of risk for future brain metastases. However, whole brain radiotherapy carries potential risks including hair loss, subacute somnolence, and neurocognitive changes including a possible reduction in short-term memory. Whole brain radiotherapy also may carry a lower likelihood of tumor control at the treatment sites because of the low-dose used. Stereotactic radiosurgery carries a higher likelihood for local tumor control at the targeted sites with lower associated risk for neurocognitive changes such as memory loss. However, the use of stereotactic radiosurgery in this setting may leave the patient at increased risk for new brain metastases elsewhere in the brain as high as 50-60%. Accordingly, patients who receive stereotactic radiosurgery in this setting should undergo ongoing surveillance imaging with brain MRI more frequently in order to identify and treat new small brain metastases before they become symptomatic.  Stereotactic radiosurgery does carry some different risks, including a risk of radionecrosis.  PLAN: Today, we reviewed the findings and workup thus far with the patient. We discussed the dilemma regarding whole brain radiotherapy versus stereotactic radiosurgery. We discussed the pros and cons of each. We also discussed the logistics and delivery of each. We reviewed the results associated with each of the treatments described above. The patient seems to understand the treatment options and would like to proceed with stereotactic radiosurgery.  He is tentatively scheduled for CT simulation/treatment planning this afternoon at 1 PM, in  anticipation of beginning his outpatient treatments on Friday, 11/19/2023.  The recommendation is for a 3 fraction course of stereotactic radiosurgery [facility]) to the left occipital lesion and inferior right cerebellar lesion.  These treatments will be coordinated with Dr. Yetta Barre who will also participate in the planning and delivery of the [facility].  He should continue taking the Decadron 4 mg p.o. 3 times daily, at discharge, and we will plan to taper him off of the steroid once he completes the radiation treatments.  He does not drive so we will help to coordinate for transportation through the cancer center to and from his radiation treatments.  He will also continue in follow-up with his medical oncologist, Dr. Delton See, at the [facility], for further workup and management of his systemic disease.  We enjoyed meeting him today and look forward to continue to participate in his care.  I personally spent 70 minutes in this encounter including chart review, reviewing radiological studies, telephone conversation with the patient, entering orders, coordinating care and completing documentation.    Marguarite Arbour, PA-C    Margaretmary Dys, MD  [facility]  Radiation Oncology Direct Dial: [phone]  Fax: [phone] [email or web address]  Skype  [email or web address]

## 2023-11-16 NOTE — Progress Notes (Signed)
Daily Progress Note Intern Pager: [phone]  Patient name: Juan Norris Medical record number: [id number] Date of birth: Oct 21, 1951 Age: 72 y.o. Gender: male  Primary Care Provider: Clinic, [facility] Consultants: Med Onc, IR, Rad Onc, Palliative, Neurosurgery Code Status: DNR, limited interventions  Pt Overview and Major Events to Date:  11/29 - Admitted, brain mets on CT 12/2 - Radiation oncology consulted, Port-A-Cath placed by IR 12/3 - Stereotactic radiosurgery CT simulation/therapy at [facility], NSGY consult   Assessment and Plan: Juan Norris is a 72 year old male with PMH of SCC of R LL, MI, HLD, CAD, HTN, HFrEF, tobacco use presenting with diplopia and dizziness found to have multiple brain mass suspected to be metastatic from lung cancer, now with possible metastasis to liver also noted.  MR abdomen read strongly favors liver lesion as benign nodular hyperplasia.  Brain lesions consistent with metastatic cancer with mass effect in fourth ventricle; will be evaluated by Neurosurgery.  IR originally planned liver biopsy, but given likely benign lesion, MedOnc now recommending PET/CT outpatient prior to assess need for brain versus lung biopsy.  RadOnc following for outpatient radiology and will perform stereotactic therapy course today at [facility] prior to D/C.  Patient also to follow up with Palliative and TOC for SCAT and then clear for discharge. Assessment & Plan Metastatic cancer (HCC) Still endorses some headache, discomfort, but dizziness and diplopia improved.  Undergoing evaluation with specialists. -Pain regimen: Tylenol 1000 Q6h scheduled, oxycodone 2.5 mg Q6h PRN for Port-A-Cath site pain -Metoclopramide scheduled TID for hiccups -PT/OT with patient -Falls precautions -Med Onc following, appreciate recommendations for PET/CT outpatient, continue decadron 4 mg TID at discharge -Radiation Oncology consulted for outpatient radiation and performing at [facility] today prior to  discharge -Palliative consulted yesterday, follow up recommendations -Neurosurgery consulted, follow up recommendations for mass effect Elevated blood sugar No history of diabetes and most recent A1c obtained this morning was 6.4.  CBGs 180 to 250 with gradual uptrend, likely in the setting of Decadron use, which will continue at discharge.  Will start metformin at discharge and consider glipizide if still hyperglycemic at follow up. -CBGs ACHS -Very sensitive SSI  Chronic and Stable Problems: HLD: Continue home atorvastatin 40 daily CAD: Continue home aspirin 81 daily HTN: Continue home Entresto 24-26 BID, home spironolactone 12.5 daily  HFrEF: Continue home carvedilol 25 daily, hold ARNI given soft BPs, restart home Lasix Tobacco use: Continue home nicotine patch daily  FEN/GI: Regular PPx: Lovenox Dispo: Home with outpatient RadOnc follow up after stereotactic therapy, Palliative and TOC follow ups.  Subjective:  This morning, patient states that he is feeling better than before.  Remains optimistic about his care and is ready to leave when medically discharged.  Notes some pain in his Port-A-Cath site.  Denies confusion, states that diplopia is improved.  He is okay with TOC consult for transportation assistance  Objective: Temp:  [97.8 F (36.6 C)-98.4 F (36.9 C)] 98 F (36.7 C) (12/03 0425) Pulse Rate:  [59-60] 59 (12/03 0425) Resp:  [14-15] 14 (12/03 0425) BP: (138-160)/(42-55) 142/48 (12/03 0425) SpO2:  [94 %-100 %] 96 % (12/03 0425)  Physical Exam: General: Age-appropriate, resting comfortably in chair at bedside, NAD, WNWD, alert and at baseline. HEENT: Normocephalic, atraumatic. Cardiovascular: Regular rate and rhythm. Normal S1/S2. No murmurs, rubs, or gallops appreciated. 2+ radial pulses. Pulmonary: Clear bilaterally to ascultation. No increased WOB, no accessory muscle usage on room air. No wheezes, rales, or crackles. Skin: Port-A-Cath site covered with dressing,  dry, and not draining. Extremities: No peripheral edema bilaterally. Neuro: No coordination difficulties, alert and oriented x4, no focal neurological deficit.  Laboratory: Most recent CBC Lab Results  Component Value Date   WBC 5.4 11/13/2023   HGB 12.1 (L) 11/13/2023   HCT 36.3 (L) 11/13/2023   MCV 87.7 11/13/2023   PLT 191 11/13/2023   Most recent BMP    Latest Ref Rng & Units 11/15/2023    7:58 AM  BMP  Glucose 70 - 99 mg/dL 098   BUN 8 - 23 mg/dL 18   Creatinine 1.19 - 1.24 mg/dL 1.47   Sodium 829 - 562 mmol/L 138   Potassium 3.5 - 5.1 mmol/L 4.2   Chloride 98 - 111 mmol/L 107   CO2 22 - 32 mmol/L 22   Calcium 8.9 - 10.3 mg/dL 9.3     Other pertinent labs: -None  New Imaging/Diagnostic Tests: -MR abdomen w/ and w/o contrast: Imaging characteristics strongly favor incidental, benign focal nodular hyperplasia rather than hepatic metastasis, however close attention on follow-up is warranted in the setting of known metastatic malignancy. -MR brain w/ and w/o contrast: Partially enhancing cystic masses in L occipital lobe and R cerebellum c/w metastatic disease.  Mass effect on fourth ventricle, right pons, right medulla.  No hydrocephalus.  [name] Sharion Dove, MD 11/16/2023, 7:20 AM  PGY-1, [facility] Family Medicine FPTS Intern pager: [phone], text pages welcome Secure chat group [facility] Teaching Service

## 2023-11-16 NOTE — Assessment & Plan Note (Addendum)
No history of diabetes and most recent A1c obtained this morning was 6.4.  CBGs 180 to 250 with gradual uptrend, likely in the setting of Decadron use, which will continue at discharge.  Will start metformin at discharge and consider glipizide if still hyperglycemic at follow up. -CBGs ACHS -Very sensitive SSI

## 2023-11-16 NOTE — Progress Notes (Signed)
  Radiation Oncology         [phone] ________________________________  Name: Juan Norris MRN: [id number]  Date: 11/16/2023  DOB: Dec 31, 1950  SIMULATION AND TREATMENT PLANNING NOTE    ICD-10-CM   1. 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  Identity was confirmed.  All relevant records and images related to the planned course of therapy were reviewed.  The patient freely provided informed written consent to proceed with treatment after reviewing the details related to the planned course of therapy. The consent form was witnessed and verified by the simulation staff. Intravenous access was established for contrast administration. Then, the patient was set-up in a stable reproducible supine position for radiation therapy.  A relocatable thermoplastic stereotactic head frame was fabricated for precise immobilization.  CT images were obtained.  Surface markings were placed.  The CT images were loaded into the planning software and fused with the patient's targeting MRI scan.  Then the target and avoidance structures were contoured.  Treatment planning then occurred.  The radiation prescription was entered and confirmed.  I have requested 3D planning  I have requested a DVH of the following structures: Brain stem, brain, left eye, right eye, lenses, optic chiasm, target volumes, uninvolved brain, and normal tissue.    SPECIAL TREATMENT PROCEDURE:  The planned course of therapy using radiation constitutes a special treatment procedure. Special care is required in the management of this patient for the following reasons. This treatment constitutes a Special Treatment Procedure for the following reason: High dose per fraction requiring special monitoring for increased toxicities of treatment including daily  imaging.  The special nature of the planned course of radiotherapy will require increased physician supervision and oversight to ensure patient's safety with optimal treatment outcomes.   PLAN:  The left occipital and right inferior cerebellar lesions will be treated to 27 Gy in 3 fractions of 9 Gy each.  ________________________________  Artist Pais Kathrynn Running, M.D.

## 2023-11-16 NOTE — Plan of Care (Signed)

## 2023-11-16 NOTE — Progress Notes (Signed)
Occupational Therapy Treatment Patient Details Name: Juan Norris MRN: [id number] DOB: 31-Mar-1951 Today's Date: 11/16/2023   History of present illness 72 year old  presenting with diplopia and dizziness, found to have multiple brain masses on CT imaging.  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   OT comments  Pt currently at modified independent level for selfcare tasks and mobility without assistive device.  Still with higher level balance deficits when attempting to stand tandem or on one foot.  Educated on exercises for balance to be completed at home while standing with surfaces he can use with his UEs to assist for safety.  No further acute OT needs or DME recommendations at this time.       If plan is discharge home, recommend the following:  Assist for transportation   Equipment Recommendations  None recommended by OT       Precautions / Restrictions Precautions Precautions: Fall Restrictions Weight Bearing Restrictions: No       Mobility Bed Mobility Overal bed mobility: Independent                  Transfers Overall transfer level: Modified independent Equipment used: None Transfers: Sit to/from Stand Sit to Stand: Independent           General transfer comment: Pt modified independent with mobility without an assistive device.  Still with slightly wider gait pattern and slower rate of speed.     Balance Overall balance assessment: Mild deficits observed, not formally tested Sitting-balance support: Feet supported, No upper extremity supported Sitting balance-Leahy Scale: Normal     Standing balance support: During functional activity Standing balance-Leahy Scale: Good Standing balance comment: Still with higher level balance deficits when attempting to stand tandem or unilaterally.                           ADL either performed or assessed with clinical judgement   ADL Overall ADL's  : Modified independent                                       General ADL Comments: Pt now with resolution of diplopia and much improved balance from last visit.  Still with some higher level balance deficits when trying to stand on one foot, currently unable with slight delay in stepping strategy.  Provided education on safe work with balance tasks at home such as standing in tandem and working on standing on each LE individually with stable surfaces on both sides.  No further acute OT needs at this time.      Cognition Arousal: Alert Behavior During Therapy: WFL for tasks assessed/performed Overall Cognitive Status: Within Functional Limits for tasks assessed                                                     Pertinent Vitals/ Pain       Pain Assessment Pain Assessment: No/denies pain         Frequency  Min 1X/week        Progress Toward Goals  OT Goals(current goals can now be found in the care plan section)  Progress towards OT goals: Goals  met/education completed, patient discharged from OT            AM-PAC OT "6 Clicks" Daily Activity     Outcome Measure   Help from another person eating meals?: None Help from another person taking care of personal grooming?: None Help from another person toileting, which includes using toliet, bedpan, or urinal?: None Help from another person bathing (including washing, rinsing, drying)?: None Help from another person to put on and taking off regular upper body clothing?: None Help from another person to put on and taking off regular lower body clothing?: None 6 Click Score: 24    End of Session    OT Visit Diagnosis: Unsteadiness on feet (R26.81)   Activity Tolerance Patient tolerated treatment well   Patient Left in chair;with call bell/phone within reach;Other (comment);with family/visitor present (with MD in room)   Nurse Communication Mobility status        Time:  8295-6213 OT Time Calculation (min): 12 min  Charges: OT General Charges $OT Visit: 1 Visit OT Treatments $Neuromuscular Re-education: 8-22 mins  Perrin Maltese, OTR/L Acute Rehabilitation Services  Office [phone] 11/16/2023

## 2023-11-16 NOTE — Assessment & Plan Note (Deleted)
Per main problem above.

## 2023-11-17 ENCOUNTER — Encounter: Payer: Self-pay | Admitting: Cardiology

## 2023-11-17 NOTE — Progress Notes (Signed)
TO BE COMPLETED BY RADIATION ONCOLOGIST OFFICE:   Patient Name: Juan Norris   Date of Birth: 08-08-1951   Radiation Oncologist: Dr. Margaretmary Dys  Site to be Treated: Brain  Will x-rays >10 MV be used? No  Will the radiation be >10 cm from the device? Yes  Planned Treatment Start Date: 11/19/2023  TO BE COMPLETED BY CARDIOLOGIST OFFICE:   Device Information:  Pacemaker []      ICD [x]    Brand: Medtronic [phone] Model #: Medtronic [id number] [place] MRI CRT-D  Serial Number: [id number]     Date of Placement: 10/30/2015 Site of Placement: Chest  Remote Device Check--Frequency: Every 91 days  Last Check: 11/10/23  Is the Patient Pacer Dependent?:  YUnkown  Does cardiologist request Radiation Oncology to schedule device testing by vendor for the following:  Prior to the Initiation of Treatments?  Yes []  No [x]  During Treatments?  Yes []  No [x]  Post Radiation Treatments?  Yes []  No [x]   Is device monitoring necessary by vendor/cardiologist team during treatments?  Yes []   No [x]   Is cardiac monitoring by Radiation Oncology nursing necessary during treatments? Yes [x]   No []   Do you recommend device be relocated prior to Radiation Treatment? Yes []   No [x]   **PLEASE LIST ANY NOTES OR SPECIAL REQUESTS:       CARDIOLOGIST SIGNATURE:  Dr. Loman Brooklyn Per Device Clinic Standing Orders, [name]  11/17/2023 9:12 AM  **Please route completed form back to Radiation Oncology Nursing and "P [facility] RAD ONC ADMIN", OR send an update if there will be a delay in having form completed by expected start date.  **Call [phone] if you have any questions or do not get an in-basket response from a Radiation Oncology staff member

## 2023-11-17 NOTE — Progress Notes (Signed)
Error

## 2023-11-18 ENCOUNTER — Telehealth: Payer: Self-pay | Admitting: Urology

## 2023-11-18 DIAGNOSIS — Z510 Encounter for antineoplastic radiation therapy: Secondary | ICD-10-CM | POA: Diagnosis present

## 2023-11-18 DIAGNOSIS — C3431 Malignant neoplasm of lower lobe, right bronchus or lung: Secondary | ICD-10-CM | POA: Diagnosis not present

## 2023-11-18 DIAGNOSIS — C7931 Secondary malignant neoplasm of brain: Secondary | ICD-10-CM | POA: Diagnosis not present

## 2023-11-18 DIAGNOSIS — F17210 Nicotine dependence, cigarettes, uncomplicated: Secondary | ICD-10-CM | POA: Diagnosis not present

## 2023-11-18 NOTE — Telephone Encounter (Signed)
Opened in error

## 2023-11-19 ENCOUNTER — Ambulatory Visit
Admission: RE | Admit: 2023-11-19 | Discharge: 2023-11-19 | Disposition: A | Discharge status: Home / Self Care | Payer: Medicare PPO | Source: Ambulatory Visit | Attending: Radiation Oncology | Admitting: Radiation Oncology

## 2023-11-19 ENCOUNTER — Other Ambulatory Visit: Payer: Self-pay

## 2023-11-19 VITALS — BP 162/55 | HR 61 | Temp 97.5°F | Resp 18

## 2023-11-19 DIAGNOSIS — Z510 Encounter for antineoplastic radiation therapy: Secondary | ICD-10-CM | POA: Diagnosis not present

## 2023-11-19 DIAGNOSIS — C7931 Secondary malignant neoplasm of brain: Secondary | ICD-10-CM

## 2023-11-19 DIAGNOSIS — C3431 Malignant neoplasm of lower lobe, right bronchus or lung: Secondary | ICD-10-CM | POA: Diagnosis not present

## 2023-11-19 LAB — RAD ONC ARIA SESSION SUMMARY
Course Elapsed Days: 0
Plan Fractions Treated to Date: 1
Plan Prescribed Dose Per Fraction: 9 Gy
Plan Total Fractions Prescribed: 3
Plan Total Prescribed Dose: 27 Gy
Reference Point Dosage Given to Date: 9 Gy
Reference Point Session Dosage Given: 9 Gy
Session Number: 1

## 2023-11-19 MED ORDER — CHLORPROMAZINE HCL 25 MG PO TABS: 25.0000 mg | ORAL_TABLET | Freq: Three times a day (TID) | ORAL | 2 refills | Status: DC | PRN

## 2023-11-19 NOTE — Progress Notes (Signed)
  Name: Juan Norris  MRN: [id number]  Date: 11/19/2023   DOB: September 07, 1951  Stereotactic Radiosurgery Operative Note  PRE-OPERATIVE DIAGNOSIS:  Multiple Brain Metastases  POST-OPERATIVE DIAGNOSIS:  Multiple Brain Metastases  PROCEDURE:  Stereotactic Radiosurgery  SURGEON:  Tia Alert, MD  NARRATIVE: The patient underwent a radiation treatment planning session in the radiation oncology simulation suite under the care of the radiation oncology physician and physicist.  I participated closely in the radiation treatment planning afterwards. The patient underwent planning CT which was fused to 3T high resolution MRI with 1 mm axial slices.  These images were fused on the planning system.  We contoured the gross target volumes and subsequently expanded this to yield the Planning Target Volume. I actively participated in the planning process.  I helped to define and review the target contours and also the contours of the optic pathway, eyes, brainstem and selected nearby organs at risk.  All the dose constraints for critical structures were reviewed and compared to AAPM Task Group 101.  The prescription dose conformity was reviewed.  I approved the plan electronically.    Accordingly, [name] was brought to the TrueBeam stereotactic radiation treatment linac and placed in the custom immobilization mask.  The patient was aligned according to the IR fiducial markers with BrainLab Exactrac, then orthogonal x-rays were used in ExacTrac with the 6DOF robotic table and the shifts were made to align the patient  [name] received stereotactic radiosurgery uneventfully.    Lesions treated:  1   Complex lesions treated:  1 (>3.5 cm, <30mm of optic path, or within the brainstem)   The detailed description of the procedure is recorded in the radiation oncology procedure note.  I was present for the duration of the procedure.  DISPOSITION:  Following delivery, the patient was transported to nursing in  stable condition and monitored for possible acute effects to be discharged to home in stable condition with follow-up in one month.  Tia Alert, MD 11/19/2023 9:57 AM

## 2023-11-19 NOTE — Progress Notes (Signed)
Patient rested with [place] for 15 minutes following his SRS treatment.  Patient denies headache, dizziness, nausea, diplopia or ringing in the ears. Denies fatigue. Patient without complaints. Understands to avoid strenuous activity for the next 24 hours and call [phone] with any questions or concerns   BP (!) 162/55   Pulse 61   Temp (!) 97.5 F (36.4 C)   Resp 18   SpO2 100%

## 2023-11-20 NOTE — Progress Notes (Signed)
  Radiation Oncology         [phone] ________________________________  Stereotactic Treatment Procedure Note  Name: Juan Norris MRN: [id number]  Date: 11/19/2023  DOB: Nov 19, 1951  SPECIAL TREATMENT PROCEDURE    ICD-10-CM   1. Metastatic cancer to brain [facility])  C79.31       3D TREATMENT PLANNING AND DOSIMETRY:  [name] radiation plan was reviewed and approved by neurosurgery and radiation oncology prior to treatment.  It showed 3-dimensional radiation distributions overlaid onto the planning CT/MRI image set.  The [facility] for the target structures as well as the organs at risk were reviewed. The documentation of the 3D plan and dosimetry are filed in the radiation oncology EMR.  NARRATIVE:  Juan Norris was brought to the TrueBeam stereotactic radiation treatment machine and placed supine on the CT couch. The head frame was applied, and the patient was set up for stereotactic radiosurgery.  Neurosurgery was present for the set-up and delivery  SIMULATION VERIFICATION:  In the couch zero-angle position, the patient underwent Exactrac imaging using the Brainlab system with orthogonal KV images.  These were carefully aligned and repeated to confirm treatment position for each of the isocenters.  The Exactrac snap film verification was repeated at each couch angle.  PROCEDURE: Juan Norris received stereotactic radiosurgery to the following targets: Left Occipital 2.6 cm lesion and the Right Cerebellar 3.3 cm lesion were treated using 4 Rapid Arc VMAT Beams to a prescription dose of 9 Gy to be repeated a total of 3 times to a prescription dose of 27 Gy.  ExacTrac registration was performed for each couch angle.  The 100% isodose line was prescribed.  6 MV X-rays were delivered in the flattening filter free beam mode.  STEREOTACTIC TREATMENT MANAGEMENT:  Following delivery, the patient was transported to nursing in stable condition and monitored for possible acute effects.  Vital signs were  recorded BP (!) 162/55   Pulse 61   Temp (!) 97.5 F (36.4 C)   Resp 18   SpO2 100% . The patient tolerated treatment without significant acute effects, and was discharged to home in stable condition.    PLAN: Follow-up in one month.  ________________________________  Artist Pais. Kathrynn Running, M.D.

## 2023-11-22 ENCOUNTER — Ambulatory Visit
Admission: RE | Admit: 2023-11-22 | Discharge: 2023-11-22 | Disposition: A | Discharge status: Home / Self Care | Payer: Self-pay | Source: Ambulatory Visit | Attending: Radiation Oncology | Admitting: Radiation Oncology

## 2023-11-22 ENCOUNTER — Other Ambulatory Visit: Payer: Self-pay

## 2023-11-22 DIAGNOSIS — Z510 Encounter for antineoplastic radiation therapy: Secondary | ICD-10-CM | POA: Diagnosis not present

## 2023-11-22 DIAGNOSIS — C7931 Secondary malignant neoplasm of brain: Secondary | ICD-10-CM | POA: Diagnosis not present

## 2023-11-22 DIAGNOSIS — C3431 Malignant neoplasm of lower lobe, right bronchus or lung: Secondary | ICD-10-CM | POA: Diagnosis not present

## 2023-11-22 LAB — RAD ONC ARIA SESSION SUMMARY
Course Elapsed Days: 3
Plan Fractions Treated to Date: 2
Plan Prescribed Dose Per Fraction: 9 Gy
Plan Total Fractions Prescribed: 3
Plan Total Prescribed Dose: 27 Gy
Reference Point Dosage Given to Date: 18 Gy
Reference Point Session Dosage Given: 5.706 Gy
Session Number: 2

## 2023-11-22 NOTE — Progress Notes (Signed)
Patient to nursing for 15 minute observation SRS Brain.  Denies headache, fatigue, ringing in ears, visual changes, and nausea.  Speech clear.  Gait independently without assistance.  No Decadron orders.  Vitals:97.3-66-18-159/59 O2 sat 98% room air.  Patient advised not to do anything strenuous for next 24 hours.  To call [phone] if have any questions or concerns.

## 2023-11-24 ENCOUNTER — Encounter: Payer: Self-pay | Admitting: Urology

## 2023-11-24 ENCOUNTER — Other Ambulatory Visit: Payer: Self-pay | Admitting: Urology

## 2023-11-24 ENCOUNTER — Ambulatory Visit
Admission: RE | Admit: 2023-11-24 | Discharge: 2023-11-24 | Disposition: A | Discharge status: Home / Self Care | Payer: Medicare PPO | Source: Ambulatory Visit | Attending: Radiation Oncology | Admitting: Radiation Oncology

## 2023-11-24 ENCOUNTER — Other Ambulatory Visit: Payer: Self-pay

## 2023-11-24 ENCOUNTER — Inpatient Hospital Stay: Payer: Medicare PPO | Attending: Nurse Practitioner | Admitting: Nurse Practitioner

## 2023-11-24 DIAGNOSIS — F17210 Nicotine dependence, cigarettes, uncomplicated: Secondary | ICD-10-CM | POA: Diagnosis not present

## 2023-11-24 DIAGNOSIS — R911 Solitary pulmonary nodule: Secondary | ICD-10-CM

## 2023-11-24 DIAGNOSIS — I700 Atherosclerosis of aorta: Secondary | ICD-10-CM | POA: Insufficient documentation

## 2023-11-24 DIAGNOSIS — Z7952 Long term (current) use of systemic steroids: Secondary | ICD-10-CM | POA: Insufficient documentation

## 2023-11-24 DIAGNOSIS — Z79899 Other long term (current) drug therapy: Secondary | ICD-10-CM | POA: Insufficient documentation

## 2023-11-24 DIAGNOSIS — C7931 Secondary malignant neoplasm of brain: Secondary | ICD-10-CM

## 2023-11-24 DIAGNOSIS — C3431 Malignant neoplasm of lower lobe, right bronchus or lung: Secondary | ICD-10-CM

## 2023-11-24 DIAGNOSIS — J432 Centrilobular emphysema: Secondary | ICD-10-CM | POA: Insufficient documentation

## 2023-11-24 DIAGNOSIS — I517 Cardiomegaly: Secondary | ICD-10-CM | POA: Insufficient documentation

## 2023-11-24 DIAGNOSIS — Z510 Encounter for antineoplastic radiation therapy: Secondary | ICD-10-CM | POA: Diagnosis not present

## 2023-11-24 DIAGNOSIS — K760 Fatty (change of) liver, not elsewhere classified: Secondary | ICD-10-CM | POA: Insufficient documentation

## 2023-11-24 DIAGNOSIS — Z923 Personal history of irradiation: Secondary | ICD-10-CM | POA: Insufficient documentation

## 2023-11-24 DIAGNOSIS — Z7984 Long term (current) use of oral hypoglycemic drugs: Secondary | ICD-10-CM | POA: Insufficient documentation

## 2023-11-24 DIAGNOSIS — R609 Edema, unspecified: Secondary | ICD-10-CM | POA: Insufficient documentation

## 2023-11-24 LAB — RAD ONC ARIA SESSION SUMMARY
Course Elapsed Days: 5
Plan Fractions Treated to Date: 3
Plan Prescribed Dose Per Fraction: 9 Gy
Plan Total Fractions Prescribed: 3
Plan Total Prescribed Dose: 27 Gy
Reference Point Dosage Given to Date: 27 Gy
Reference Point Session Dosage Given: 9 Gy
Session Number: 3

## 2023-11-24 NOTE — Addendum Note (Signed)
Encounter addended by: Roel Cluck, RN on: 11/24/2023 2:58 PM  Actions taken: Clinical Note Signed

## 2023-11-24 NOTE — Progress Notes (Signed)
  Radiation Oncology         [phone] ________________________________  Name: FLABIO VRADENBURG  GNF:[id number]  Date of Service: 11/24/23  DOB: 06/08/1951   Steroid Taper Instructions   You currently have a prescription for Dexamethasone 4 mg Tablets.   Beginning 11/25/23  Take a 4 mg tablet twice a day for 7 days  Beginning 12/02/23: Take 1/2 of a tablet (which is 2 mg) twice a day x 7 days  Beginning 12/09/23: Take 1/2 of a tablet (which is 2 mg) once a day x 7 days  Beginning 12/16/23: Take 1/2 of a tablet (which is 2 mg) every other day and stop on 12/22/23.   Please call our office if you have any headaches, visual changes, uncontrolled movements, extremity weakness, nausea or vomiting.     Marguarite Arbour, PA-C    Margaretmary Dys, MD  [facility]  Radiation Oncology Direct Dial: [phone]  Fax: [phone] [email or web address]  Skype  LinkedIn

## 2023-11-24 NOTE — Progress Notes (Unsigned)
Palliative Medicine [facility]  [phone] [phone]) [phone]   Name: Juan Norris Date: 11/24/2023 MRN: [id number]  DOB: 07-Oct-1951  Patient Care Team: Clinic, [facility]ink as PCP - General Hillis Range, MD (Inactive) as PCP - Electrophysiology (Cardiology) Collier Bullock as Physician Assistant (Cardiology) Meriam Sprague, MD (Inactive) as Consulting Physician (Cardiology) Dawley, Alan Mulder, DO as Consulting Physician    REASON FOR CONSULTATION: Juan Norris is a 72 y.o. 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  Palliative ask to see for symptom management and goals of care.    SOCIAL HISTORY:     reports that he has been smoking cigarettes. He has a 70.5 pack-year smoking history. He has never used smokeless tobacco. He reports that he does not currently use alcohol. He reports that he does not currently use drugs.  ADVANCE DIRECTIVES:  None on file  CODE STATUS: Full code  PAST MEDICAL HISTORY: Past Medical History:  Diagnosis Date   Acute renal insufficiency    Anginal pain (HCC)    Chronic lower back pain    "due to 3 ruptured discs in my back" (11/08/2018)   High cholesterol    History of kidney stones 1982   "passed them"   History of radiation therapy    Right lung- 11/03/21-11/14/21- Dr. Joylene Draft   Hypertension    Lung cancer [facility])    Myocardial infarction (HCC) 03/2018   Systolic heart failure (HCC)    Tobacco use     PAST SURGICAL HISTORY:  Past Surgical History:  Procedure Laterality Date   BIV ICD INSERTION CRT-D N/A 11/08/2018    Medtronic biventricular ICD implantation by Dr Johney Frame with His Bundle pacing and a MDT 3830 lead placed into the LV port.    BRONCHIAL BIOPSY  10/13/2021   Procedure: BRONCHIAL BIOPSIES;  Surgeon: Leslye Peer, MD;  Location[facility] ENDOSCOPY;   Service: Pulmonary;;   BRONCHIAL BRUSHINGS  10/13/2021   Procedure: BRONCHIAL BRUSHINGS;  Surgeon: Leslye Peer, MD;  Location: [facility] ENDOSCOPY;  Service: Pulmonary;;   BRONCHIAL NEEDLE ASPIRATION BIOPSY  10/13/2021   Procedure: BRONCHIAL NEEDLE ASPIRATION BIOPSIES;  Surgeon: Leslye Peer, MD;  Location: [facility] ENDOSCOPY;  Service: Pulmonary;;   BRONCHIAL WASHINGS  10/13/2021   Procedure: BRONCHIAL WASHINGS;  Surgeon: Leslye Peer, MD;  Location: [facility] ENDOSCOPY;  Service: Pulmonary;;   CARDIAC CATHETERIZATION     FIDUCIAL MARKER PLACEMENT  10/13/2021   Procedure: FIDUCIAL MARKER PLACEMENT;  Surgeon: Leslye Peer, MD;  Location: [facility] ENDOSCOPY;  Service: Pulmonary;;   INGUINAL HERNIA REPAIR Bilateral    IR IMAGING GUIDED PORT INSERTION  11/15/2023   KNEE SURGERY Left 1990s   "growth on the back of my knee removed"   LEFT HEART CATH AND CORONARY ANGIOGRAPHY N/A 03/30/2018   Procedure: LEFT HEART CATH AND CORONARY ANGIOGRAPHY;  Surgeon: Kathleene Hazel, MD;  Location: [facility] INVASIVE CV LAB;  Service: Cardiovascular;  Laterality: N/A;   VIDEO BRONCHOSCOPY WITH ENDOBRONCHIAL NAVIGATION N/A 10/13/2021   Procedure: ROBOTIC VIDEO BRONCHOSCOPY WITH ENDOBRONCHIAL NAVIGATION;  Surgeon: Leslye Peer, MD;  Location: [facility] ENDOSCOPY;  Service: Pulmonary;  Laterality: N/A;   VIDEO BRONCHOSCOPY WITH RADIAL ENDOBRONCHIAL ULTRASOUND  10/13/2021   Procedure: RADIAL ENDOBRONCHIAL ULTRASOUND;  Surgeon: Leslye Peer, MD;  Location: [facility] ENDOSCOPY;  Service: Pulmonary;;    HEMATOLOGY/ONCOLOGY HISTORY:  Oncology History  No history exists.    ALLERGIES:  has No Known Allergies.  MEDICATIONS:  Current Outpatient Medications  Medication Sig Dispense Refill   aspirin 81 MG EC tablet Take 1 tablet (81 mg total) by mouth daily. 30 tablet 6   atorvastatin (LIPITOR) 40 MG tablet Take 1 tablet (40 mg total) by mouth at bedtime.     carvedilol (COREG) 25 MG tablet Take 12.5 mg by mouth daily.      chlorproMAZINE (THORAZINE) 25 MG tablet Take 1 tablet (25 mg total) by mouth 3 (three) times daily as needed (Hiccups). 45 tablet 2   dexamethasone (DECADRON) 4 MG tablet Take 1 tablet (4 mg total) by mouth every 8 (eight) hours. 90 tablet 1   furosemide (LASIX) 20 MG tablet Take 1 tablet (20 mg total) by mouth daily. As directed 30 tablet 11   metFORMIN (GLUCOPHAGE) 500 MG tablet Take 1 tablet (500 mg total) by mouth 2 (two) times daily with a meal. 60 tablet 0   metoCLOPramide (REGLAN) 5 MG tablet Take 1 tablet (5 mg total) by mouth every 8 (eight) hours as needed for nausea. 30 tablet 0   Multiple Vitamin (MULTIVITAMIN WITH MINERALS) TABS tablet Take 1 tablet by mouth daily.     nicotine (NICODERM CQ - DOSED IN MG/24 HOURS) 21 mg/24hr patch Place 21 mg onto the skin daily. Last patch was applied 3-4 days ago, no patch is on today 11/12/2023.     spironolactone (ALDACTONE) 25 MG tablet Take 0.5 tablets (12.5 mg total) by mouth daily. 15 tablet 5   No current facility-administered medications for this visit.    VITAL SIGNS: There were no vitals taken for this visit. There were no vitals filed for this visit.  Estimated body mass index is 23.84 kg/m as calculated from the following:   Height as of 11/12/23: 5\' 6"  (1.676 m).   Weight as of 11/12/23: 147 lb 11.3 oz (67 kg).  LABS: CBC:    Component Value Date/Time   WBC 5.4 11/13/2023 0438   HGB 12.1 (L) 11/13/2023 0438   HGB 13.6 02/12/2021 0802   HCT 36.3 (L) 11/13/2023 0438   HCT 40.1 02/12/2021 0802   PLT 191 11/13/2023 0438   PLT 219 02/12/2021 0802   MCV 87.7 11/13/2023 0438   MCV 88 02/12/2021 0802   NEUTROABS 6.0 11/12/2023 1047   NEUTROABS 8.5 (H) 09/04/2019 1028   LYMPHSABS 1.2 11/12/2023 1047   LYMPHSABS 2.0 09/04/2019 1028   MONOABS 0.5 11/12/2023 1047   EOSABS 0.1 11/12/2023 1047   EOSABS 0.0 09/04/2019 1028   BASOSABS 0.0 11/12/2023 1047   BASOSABS 0.0 09/04/2019 1028   Comprehensive Metabolic Panel:     Component Value Date/Time   NA 138 11/15/2023 0758   NA 142 11/19/2022 0920   K 4.2 11/15/2023 0758   CL 107 11/15/2023 0758   CO2 22 11/15/2023 0758   BUN 18 11/15/2023 0758   BUN 21 11/19/2022 0920   CREATININE 1.07 11/15/2023 0758   GLUCOSE 177 (H) 11/15/2023 0758   CALCIUM 9.3 11/15/2023 0758   AST 28 11/12/2023 1047   ALT 28 11/12/2023 1047   ALKPHOS 73 11/12/2023 1047   BILITOT 0.7 11/12/2023 1047   BILITOT 0.5 02/12/2021 0802   PROT 6.9 11/12/2023 1047   PROT 7.1 02/12/2021 0802   ALBUMIN 3.6 11/12/2023 1047   ALBUMIN 4.1 02/12/2021 0802    RADIOGRAPHIC STUDIES: MR ABDOMEN W WO CONTRAST  Result Date: 11/15/2023 CLINICAL DATA:  Characterize right lobe of  the liver lesion, history of metastatic lung cancer EXAM: MRI ABDOMEN WITHOUT AND WITH CONTRAST TECHNIQUE: Multiplanar multisequence MR imaging of the abdomen was performed both before and after the administration of intravenous contrast. CONTRAST:  7mL GADAVIST GADOBUTROL 1 MMOL/ML IV SOLN COMPARISON:  CT chest abdomen pelvis, 11/13/2023 FINDINGS: Lower chest: No acute abnormality.  Cardiomegaly. Hepatobiliary: Lesion in anterior hepatic segment IV measuring 2.2 x 1.9 cm, with transient early arterial hyperenhancement and near isoenhancement on remaining contrast phases (series 11, image 35). Minimal if any underlying diffusion restriction (series 6, image 84). Similar lesion with early arterial hyperenhancement in the posterior liver dome, hepatic segment VII, measuring 1.6 x 0.9 cm (series 11, image 18). No gallstones. Mild dilatation of the common bile duct measuring up to 0.9 cm in caliber, without calculus or other obstruction identified to the ampulla, this appearance unchanged compared prior examinations. Pancreas: Unremarkable. No pancreatic ductal dilatation or surrounding inflammatory changes. Spleen: Normal in size without significant abnormality. Adrenals/Urinary Tract: Adrenal glands are unremarkable. Simple, benign  bilateral renal cortical cysts, for which no further follow-up or characterization required. Kidneys are otherwise normal, without obvious renal calculi, solid lesion, or hydronephrosis. Stomach/Bowel: Stomach is within normal limits. No evidence of bowel wall thickening, distention, or inflammatory changes. Vascular/Lymphatic: Aortic atherosclerosis. No enlarged abdominal lymph nodes. Other: No abdominal wall hernia or abnormality. No ascites. Musculoskeletal: No acute or significant osseous findings. IMPRESSION: 1. Lesion in anterior hepatic segment IV measuring 2.2 x 1.9 cm, with transient early arterial hyperenhancement and near isoenhancement on remaining contrast phases. Minimal if any underlying diffusion restriction. Similar lesion with early arterial hyperenhancement in the posterior liver dome, hepatic segment VII, measuring 1.6 x 0.9 cm. Imaging characteristics strongly favor incidental, benign focal nodular hyperplasia rather than hepatic metastasis, however close attention on follow-up is warranted in the setting of known metastatic malignancy. 2. Mild dilatation of the common bile duct measuring up to 0.9 cm in caliber, without calculus or other obstruction identified to the ampulla, this appearance unchanged compared prior examinations and most likely benign. 3. Cardiomegaly. Aortic Atherosclerosis (ICD10-I70.0). Electronically Signed   By: Jearld Lesch M.D.   On: 11/15/2023 18:45   MR BRAIN W WO CONTRAST  Result Date: 11/15/2023 CLINICAL DATA:  Right lung squamous cell carcinoma, double vision and dizziness, metastatic disease evaluation EXAM: MRI HEAD WITHOUT AND WITH CONTRAST TECHNIQUE: Multiplanar, multiecho pulse sequences of the brain and surrounding structures were obtained without and with intravenous contrast. CONTRAST:  7mL GADAVIST GADOBUTROL 1 MMOL/ML IV SOLN COMPARISON:  11/13/2023 CT head, 09/01/2021 MRI head FINDINGS: Brain: Peripherally enhancing, partially cystic mass in the left  occipital lobe measures up to 2.6 x 2.6 x 2.1 cm (AP x TR x CC) (series 18, image 62 and series 100, image 211). This mass associated with surrounding T2 hyperintense signal, likely edema, with mild mass effect on the left occipital horn. Additional peripherally enhancing, partially cystic mass in the right cerebellum, which measures up to 3.0 x 3.3 x 2.2 cm (AP x TR x CC) (series 18, image 39 and series 100, image 165). This mass is also so seated with surrounding T2 hyperintense signal, likely edema, with mass effect on the fourth ventricle, right pons, and right medulla. No restricted diffusion to suggest acute or subacute infarct. No acute hemorrhage or midline shift. No hydrocephalus or extra-axial collection. Pituitary and craniocervical junction within normal limits. Some hemosiderin deposition is associated with the aforementioned masses. No other hemosiderin deposition to suggest remote hemorrhage. Normal cerebral volume for  age. 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 Scattered T2 hyperintense signal in the periventricular white matter, likely the sequela of mild chronic small vessel ischemic disease. Vascular: Normal arterial flow voids. Normal arterial and venous enhancement. Skull and upper cervical spine: Normal marrow signal. Sinuses/Orbits: Left maxillary mucous retention cyst. Otherwise overall clear paranasal sinuses. No acute finding in the orbits. Other: The mastoid air cells are well aerated. IMPRESSION: Peripherally enhancing, partially cystic masses in the left occipital lobe and right cerebellum, consistent with metastatic disease. The right cerebellar mass is associated with mass effect on the fourth ventricle, right pons, and right medulla. No hydrocephalus. Electronically Signed   By: Wiliam Ke M.D.   On: 11/15/2023 14:52    PERFORMANCE STATUS (ECOG) : {CHL ONC ECOG OZ:[id number]}  Review of Systems Unless otherwise noted, a complete review of systems is  negative.  Physical Exam General: NAD Cardiovascular: regular rate and rhythm Pulmonary: clear ant fields Abdomen: soft, nontender, + bowel sounds Extremities: no edema, no joint deformities Skin: no rashes Neurological: Alert and oriented x3  IMPRESSION: *** I introduced myself, Roxanna Mcever RN, and Palliative's role in collaboration with the oncology team. Concept of Palliative Care was introduced as specialized medical care for people and their families living with serious illness.  It focuses on providing relief from the symptoms and stress of a serious illness.  The goal is to improve quality of life for both the patient and the family. Values and goals of care important to patient and family were attempted to be elicited.    We discussed *** current illness and what it means in the larger context of *** on-going co-morbidities. Natural disease trajectory and expectations were discussed.  I discussed the importance of continued conversation with family and their medical providers regarding overall plan of care and treatment options, ensuring decisions are within the context of the patients values and GOCs.  PLAN: Established therapeutic relationship. Education provided on palliative's role in collaboration with their Oncology/Radiation team. I will plan to see patient back in 2-4 weeks in collaboration to other oncology appointments.    Patient expressed understanding and was in agreement with this plan. He also understands that He can call the clinic at any time with any questions, concerns, or complaints.   Thank you for your referral and allowing Palliative to assist in Mr. Mauro Bouch All's care.   Number and complexity of problems addressed: ***HIGH - 1 or more chronic illnesses with SEVERE exacerbation, progression, or side effects of treatment - advanced cancer, pain. Any controlled substances utilized were prescribed in the context of palliative care.   Visit consisted of  counseling and education dealing with the complex and emotionally intense issues of symptom management and palliative care in the setting of serious and potentially life-threatening illness.  Signed by: Willette Alma, AGPCNP-BC Palliative Medicine Team/[facility]   *Please note that this is a verbal dictation therefore any spelling or grammatical errors are due to the "Dragon Medical One" system interpretation.

## 2023-11-24 NOTE — Progress Notes (Signed)
Patient to nursing for 15 minute observation SRS Brain. Denies headache, fatigue, ringing in ears, visual changes, and nausea. Speech clear. Gait independently without assistance. Decadron taper instructions verbally and written copy given to patient.  Vitals: 98.2-62-18-140/60 O2 sat 100% room air. Patient advised not to do anything strenuous for next 24 hours. To call [phone] if have any questions or concerns. Juan Norris was informed that medical oncology will receive referral for treatment of his lung and that office will be reaching out to him.

## 2023-11-25 NOTE — Radiation Completion Notes (Addendum)
  Radiation Oncology         [phone] ________________________________  Name: Juan Norris MRN: [id number]  Date: 11/24/2023  DOB: 14-Aug-1951  Referring Physician: Levy Pupa, M.D. Date of Service: 2023-11-25 Radiation Oncologist: Margaretmary Bayley, M.D. [facility] [place]     RADIATION ONCOLOGY END OF TREATMENT NOTE     Diagnosis: 72 y/o man with oligometastatic brain disease, secondary to stage IA2, NSCLC, squamous cell carcinoma of the LUL  Intent: Palliative     ==========DELIVERED PLANS==========  First Treatment Date: 2023-11-19 Last Treatment Date: 2023-11-24   Plan Name: Brain_SRT Site: Brain Technique: SBRT/SRT-IMRT Mode: Photon Dose Per Fraction: 9 Gy Prescribed Dose (Delivered / Prescribed): 27 Gy / 27 Gy Prescribed Fxs (Delivered / Prescribed): 3 / 3     ==========ON TREATMENT VISIT DATES========== 2023-11-19, 2023-11-22, 2023-11-24   See weekly On Treatment Notes in Epic for details in the Media tab (listed as Progress notes on the On Treatment Visit Dates listed above).  He tolerated the treatments well without any acute ill side effects.  The patient will receive a call in about one month from the radiation oncology department. We will continue to follow him with serial brain MRI scans every 3 months for surveillance/monitoring for any disease progression or recurrence. He will continue follow up with his medical oncologist at the [facility], Dr. Mathis Bud, as well.  ------------------------------------------------   Margaretmary Dys, MD [facility]  Radiation Oncology Direct Dial: [phone]  Fax: [phone] [email or web address]  Skype  LinkedIn

## 2023-11-29 ENCOUNTER — Encounter (HOSPITAL_COMMUNITY)
Admission: RE | Admit: 2023-11-29 | Discharge: 2023-11-29 | Disposition: A | Discharge status: Home / Self Care | Payer: No Typology Code available for payment source | Source: Ambulatory Visit | Attending: Oncology | Admitting: Oncology

## 2023-11-29 DIAGNOSIS — C3411 Malignant neoplasm of upper lobe, right bronchus or lung: Secondary | ICD-10-CM | POA: Diagnosis not present

## 2023-11-29 DIAGNOSIS — C3491 Malignant neoplasm of unspecified part of right bronchus or lung: Secondary | ICD-10-CM | POA: Insufficient documentation

## 2023-11-29 DIAGNOSIS — C3431 Malignant neoplasm of lower lobe, right bronchus or lung: Secondary | ICD-10-CM | POA: Insufficient documentation

## 2023-11-29 LAB — GLUCOSE, CAPILLARY: Glucose-Capillary: 229 mg/dL — ABNORMAL HIGH (ref 70–99)

## 2023-11-29 MED ORDER — FLUDEOXYGLUCOSE F - 18 (FDG) INJECTION
7.6000 | Freq: Once | INTRAVENOUS | Status: AC
  Administered 2023-11-29: 7.71 via INTRAVENOUS

## 2023-12-03 ENCOUNTER — Other Ambulatory Visit: Payer: Self-pay | Admitting: Oncology

## 2023-12-03 ENCOUNTER — Inpatient Hospital Stay (HOSPITAL_BASED_OUTPATIENT_CLINIC_OR_DEPARTMENT_OTHER): Payer: Medicare PPO | Admitting: Oncology

## 2023-12-03 ENCOUNTER — Inpatient Hospital Stay: Payer: Medicare PPO

## 2023-12-03 ENCOUNTER — Other Ambulatory Visit: Payer: Self-pay

## 2023-12-03 ENCOUNTER — Encounter: Payer: Self-pay | Admitting: Oncology

## 2023-12-03 VITALS — BP 150/52 | HR 73 | Temp 98.0°F | Resp 16 | Ht 66.0 in | Wt 153.9 lb

## 2023-12-03 DIAGNOSIS — I700 Atherosclerosis of aorta: Secondary | ICD-10-CM | POA: Diagnosis not present

## 2023-12-03 DIAGNOSIS — C3431 Malignant neoplasm of lower lobe, right bronchus or lung: Secondary | ICD-10-CM

## 2023-12-03 DIAGNOSIS — C7931 Secondary malignant neoplasm of brain: Secondary | ICD-10-CM

## 2023-12-03 DIAGNOSIS — R609 Edema, unspecified: Secondary | ICD-10-CM | POA: Diagnosis not present

## 2023-12-03 DIAGNOSIS — Z7952 Long term (current) use of systemic steroids: Secondary | ICD-10-CM | POA: Diagnosis not present

## 2023-12-03 DIAGNOSIS — Z7984 Long term (current) use of oral hypoglycemic drugs: Secondary | ICD-10-CM | POA: Diagnosis not present

## 2023-12-03 DIAGNOSIS — I517 Cardiomegaly: Secondary | ICD-10-CM | POA: Diagnosis not present

## 2023-12-03 DIAGNOSIS — K760 Fatty (change of) liver, not elsewhere classified: Secondary | ICD-10-CM | POA: Diagnosis not present

## 2023-12-03 DIAGNOSIS — Z923 Personal history of irradiation: Secondary | ICD-10-CM | POA: Diagnosis not present

## 2023-12-03 DIAGNOSIS — J432 Centrilobular emphysema: Secondary | ICD-10-CM | POA: Diagnosis not present

## 2023-12-03 DIAGNOSIS — Z79899 Other long term (current) drug therapy: Secondary | ICD-10-CM | POA: Diagnosis not present

## 2023-12-03 LAB — COMPREHENSIVE METABOLIC PANEL
ALT: 23 U/L (ref 0–44)
AST: 15 U/L (ref 15–41)
Albumin: 3.7 g/dL (ref 3.5–5.0)
Alkaline Phosphatase: 115 U/L (ref 38–126)
Anion gap: 6 (ref 5–15)
BUN: 28 mg/dL — ABNORMAL HIGH (ref 8–23)
CO2: 28 mmol/L (ref 22–32)
Calcium: 9.1 mg/dL (ref 8.9–10.3)
Chloride: 101 mmol/L (ref 98–111)
Creatinine, Ser: 1.15 mg/dL (ref 0.61–1.24)
GFR, Estimated: >60 mL/min (ref >60)
Glucose, Bld: 354 mg/dL — ABNORMAL HIGH (ref 70–99)
Potassium: 4.6 mmol/L (ref 3.5–5.1)
Sodium: 135 mmol/L (ref 135–145)
Total Bilirubin: 0.7 mg/dL (ref <1.2)
Total Protein: 5.9 g/dL — ABNORMAL LOW (ref 6.5–8.1)

## 2023-12-03 LAB — TSH: TSH: 1.077 u[IU]/mL (ref 0.350–4.500)

## 2023-12-03 LAB — CBC WITH DIFFERENTIAL/PLATELET
Abs Immature Granulocytes: 0.17 10*3/uL — ABNORMAL HIGH (ref 0.00–0.07)
Basophils Absolute: 0 10*3/uL (ref 0.0–0.1)
Basophils Relative: 0 %
Eosinophils Absolute: 0 10*3/uL (ref 0.0–0.5)
Eosinophils Relative: 0 %
HCT: 38.8 % — ABNORMAL LOW (ref 39.0–52.0)
Hemoglobin: 13.6 g/dL (ref 13.0–17.0)
Immature Granulocytes: 1 %
Lymphocytes Relative: 6 %
Lymphs Abs: 1 10*3/uL (ref 0.7–4.0)
MCH: 30.8 pg (ref 26.0–34.0)
MCHC: 35.1 g/dL (ref 30.0–36.0)
MCV: 88 fL (ref 80.0–100.0)
Monocytes Absolute: 0.9 10*3/uL (ref 0.1–1.0)
Monocytes Relative: 6 %
Neutro Abs: 13.3 10*3/uL — ABNORMAL HIGH (ref 1.7–7.7)
Neutrophils Relative %: 87 %
Platelets: 117 10*3/uL — ABNORMAL LOW (ref 150–400)
RBC: 4.41 MIL/uL (ref 4.22–5.81)
RDW: 14 % (ref 11.5–15.5)
WBC: 15.3 10*3/uL — ABNORMAL HIGH (ref 4.0–10.5)
nRBC: 0 % (ref 0.0–0.2)

## 2023-12-03 LAB — LACTATE DEHYDROGENASE: LDH: 237 U/L — ABNORMAL HIGH (ref 98–192)

## 2023-12-03 MED ORDER — DEXAMETHASONE 4 MG PO TABS: ORAL_TABLET | ORAL | 1 refills | Status: DC

## 2023-12-03 MED ORDER — PROCHLORPERAZINE MALEATE 10 MG PO TABS: 10.0000 mg | ORAL_TABLET | Freq: Four times a day (QID) | ORAL | 1 refills | Status: DC | PRN

## 2023-12-03 MED ORDER — LIDOCAINE-PRILOCAINE 2.5-2.5 % EX CREA: TOPICAL_CREAM | CUTANEOUS | 3 refills | Status: DC

## 2023-12-03 MED ORDER — FOLIC ACID 1 MG PO TABS: 1.0000 mg | ORAL_TABLET | Freq: Every day | ORAL | 3 refills | Status: DC

## 2023-12-03 MED ORDER — ONDANSETRON HCL 8 MG PO TABS: 8.0000 mg | ORAL_TABLET | Freq: Three times a day (TID) | ORAL | 1 refills | Status: DC | PRN

## 2023-12-03 NOTE — Progress Notes (Signed)
[facility]  ONCOLOGY CLINIC PROGRESS NOTE   Patient Care Team: Clinic, [facility] as PCP - General Hillis Range, MD (Inactive) as PCP - Electrophysiology (Cardiology) Collier Bullock as Physician Assistant (Cardiology) Meriam Sprague, MD (Inactive) as Consulting Physician (Cardiology) Dawley, Alan Mulder, DO as Consulting Physician  PATIENT NAME: Juan Norris   MR#: [id number] DOB: [birth year]  Date of visit: 12/03/2023   ASSESSMENT & PLAN:   72 y.o. 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 Roselind Messier, last seen by Dr. Roselind Messier in April 2023, presented to the ED on 11/03/2023 with complaints of double vision and dizziness and falls for 1 week prior to arrival.  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   Malignant neoplasm of lower lobe of right lung (HCC) - Please review HPI above for additional details.  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  -He continue to follow-up with Dr. Delton See at [facility] clinic for oncology follow-up.  Previously imaging studies showed no evidence of disease recurrence, last 1 in August 2024.  -When he presented to the ED on 11/03/2023 with complaints of double vision and dizziness, CT imaging showed left occipital lobe mass and right posterior fossa mass consistent with metastatic disease.  Rest of the workup is as mentioned above.  -He recently completed stereotactic radiosurgery to intracranial metastatic lesions and tolerated the treatments well.  - PET/CT completed on 11/29/2023.  Results pending.  -Today I submitted request for Foundation One testing on the specimen from October 2022.  Also request admitted for  Guardant360, liquid biopsy to look for any actionable mutations.  -Given stage IV disease, all treatment options are palliative in nature and not curative in intent.  Patient verbalized understanding of this fact.  - Plan to proceed with palliative systemic chemoimmunotherapy with carboplatin, paclitaxel and pembrolizumab for 4 cycles, followed by restaging imaging, followed by pembrolizumab maintenance.  Pending [facility] authorization for treatments at our facility.  If not, patient will receive treatments at [facility] medical oncology clinic.  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 Kathrynn Running.   -currently on decadron taper.     I reviewed lab results and outside records for this visit and discussed relevant results with the patient. Diagnosis, plan of care and treatment options were also discussed in detail with the patient. Opportunity provided to ask questions and answers provided to his apparent satisfaction. Provided instructions to call our clinic with any problems, questions or concerns prior to return visit. I recommended to continue follow-up with PCP and sub-specialists. He verbalized understanding and agreed with the plan.   NCCN guidelines have been consulted in the planning of this patient's care.  I spent a total of 55 minutes during this encounter with the patient including review of chart and various tests results, discussions about plan of care and coordination of care plan.   Meryl Crutch, MD  12/03/2023 5:30 PM   [facility] - [facility] DEPT [facility] [address] [place] [address] Dept: [phone] Dept Fax: [phone]  CHIEF COMPLAINT/ REASON FOR VISIT:   History of stage I A right lung squamous cell carcinoma, treated with IMRT in 2022, now with intracranial metastatic disease, diagnosed in November 2024.  Current Treatment:  Received stereotactic radiosurgery to brain metastatic disease.  Plan for palliative systemic treatments with carboplatin, paclitaxel, Keytruda followed by Keytruda maintenance.  INTERVAL HISTORY:    Discussed the use of AI scribe software for clinical note transcription with the patient, who gave verbal consent to proceed.   He completed radiation treatment last week and reports tolerating the treatment well.  He reports that the focus of his treatment has been on the brain metastasis, but he is aware of additional lesions in the left lung. There was a question of liver involvement, but MRI showed it to be a benign spot.   He expresses a desire to start treatment as soon as possible, ideally after the New Year. He also expresses a desire to have all treatment information sent to the [place].  I have reviewed the past medical history, past surgical history, social history and family history with the patient and they are unchanged from previous note.  HISTORY OF PRESENT ILLNESS:    72 y.o. 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 Roselind Messier, last seen by Dr. Roselind Messier in April 2023, presented to the ED on 11/03/2023 with complaints of double vision and dizziness and falls for 1 week prior to arrival.   CT head showed left occipital lobe mass and right posterior fossa mass consistent with metastatic disease.   MRI of the brain could not be obtained initially as it was thought that his AICD was not compatible with MRI.   On 11/13/2023, CT chest, abdomen and pelvis showed new irregular peripheral left upper lobe lung nodule measuring 1.3 x 0.8 cm and new subpleural right upper lobe 5 x 2 mm nodule, suspicious for pulmonary neoplasm.  Hepatic segment 4 lesion measuring 2 x 1.8 cm, suspicious for metastatic disease.  Later on we got clearance  to proceed with MRI.  MRI of the abdomen with and without contrast on 11/15/2023 showed lesion in anterior hepatic segment 4 measuring 2.2 x 1.9 cm, another lesion in segment 7 measuring 1.6 x 0.9 cm, imaging features strongly favor an incidental, benign focal nodular hyperplasia rather than hepatic metastatic disease.  MRI of the brain on 11/15/2023 showed peripherally enhancing, partially cystic masses in the left occipital lobe and right cerebellum, consistent with metastatic disease.  Right cerebellar mass was associated with mass effect on the fourth ventricle, right pons and right medulla.  No hydrocephalus.  Given risks outweighing benefits with intracranial biopsy, decision made to proceed with stereotactic radiotherapy to the brain lesions and he received this under the direction of Dr. Kathrynn Running.  PET/CT completed on 11/29/2023.  Results pending.  Plan to proceed with palliative systemic chemoimmunotherapy with carboplatin, paclitaxel and pembrolizumab for 4 cycles, followed by restaging imaging, followed by pembrolizumab maintenance.  Pending VA authorization for treatments at our facility.  If not, patient will receive treatments at [facility] medical oncology clinic.  Oncology History  Malignant neoplasm of lower lobe of right lung (HCC)  11/12/2023 Initial Diagnosis   Malignant neoplasm of lower lobe of right lung (HCC)   12/03/2023 Cancer Staging   Staging form: Lung, AJCC 8th Edition - Clinical: Stage IVB (cTX, cNX,  cM1c) - Signed by Meryl Crutch, MD on 12/03/2023   12/16/2023 -  Chemotherapy   Patient is on Treatment Plan : LUNG NSCLC Carboplatin (6) + Paclitaxel (200) + Pembrolizumab (200) D1 q21d x 4 cycles / Pembrolizumab (200) Maintenance D1 q21d     Metastatic cancer to brain (HCC)  11/13/2023 Initial Diagnosis   Metastatic cancer to brain (HCC)   12/16/2023 -  Chemotherapy   Patient is on Treatment Plan : LUNG NSCLC Carboplatin (6) + Paclitaxel (200) + Pembrolizumab (200) D1 q21d  x 4 cycles / Pembrolizumab (200) Maintenance D1 q21d         REVIEW OF SYSTEMS:   Review of Systems - Oncology  All other pertinent systems were reviewed with the patient and are negative.  ALLERGIES: He has no known allergies.  MEDICATIONS:  Current Outpatient Medications  Medication Sig Dispense Refill   aspirin 81 MG EC tablet Take 1 tablet (81 mg total) by mouth daily. 30 tablet 6   atorvastatin (LIPITOR) 40 MG tablet Take 1 tablet (40 mg total) by mouth at bedtime.     carvedilol (COREG) 25 MG tablet Take 12.5 mg by mouth daily.     chlorproMAZINE (THORAZINE) 25 MG tablet Take 1 tablet (25 mg total) by mouth 3 (three) times daily as needed (Hiccups). 45 tablet 2   dexamethasone (DECADRON) 4 MG tablet Take 1 tablet (4 mg total) by mouth every 8 (eight) hours. 90 tablet 1   furosemide (LASIX) 20 MG tablet Take 1 tablet (20 mg total) by mouth daily. As directed 30 tablet 11   metFORMIN (GLUCOPHAGE) 500 MG tablet Take 1 tablet (500 mg total) by mouth 2 (two) times daily with a meal. 60 tablet 0   metoCLOPramide (REGLAN) 5 MG tablet Take 1 tablet (5 mg total) by mouth every 8 (eight) hours as needed for nausea. 30 tablet 0   Multiple Vitamin (MULTIVITAMIN WITH MINERALS) TABS tablet Take 1 tablet by mouth daily.     nicotine (NICODERM CQ - DOSED IN MG/24 HOURS) 21 mg/24hr patch Place 21 mg onto the skin daily. Last patch was applied 3-4 days ago, no patch is on today 11/12/2023.     spironolactone (ALDACTONE) 25 MG tablet Take 0.5 tablets (12.5 mg total) by mouth daily. 15 tablet 5   dexamethasone (DECADRON) 4 MG tablet Take 1 tab 2 times daily starting day before pemetrexed. Then take 2 tabs daily x 3 days starting day after carboplatin. Take with food. 30 tablet 1   dexamethasone (DECADRON) 4 MG tablet Take 2 tablets (8mg ) by mouth daily starting the day after carboplatin for 3 days. Take with food 30 tablet 1   lidocaine-prilocaine (EMLA) cream Apply to affected area once 30 g 3    lidocaine-prilocaine (EMLA) cream Apply to affected area once 30 g 3   ondansetron (ZOFRAN) 8 MG tablet Take 1 tablet (8 mg total) by mouth every 8 (eight) hours as needed for nausea or vomiting. Start on the third day after carboplatin. 30 tablet 1   ondansetron (ZOFRAN) 8 MG tablet Take 1 tablet (8 mg total) by mouth every 8 (eight) hours as needed for nausea or vomiting. Start on the third day after carboplatin. 30 tablet 1   prochlorperazine (COMPAZINE) 10 MG tablet Take 1 tablet (10 mg total) by mouth every 6 (six) hours as needed for nausea or vomiting. 30 tablet 1   prochlorperazine (COMPAZINE) 10 MG tablet Take 1 tablet (10 mg total) by mouth every 6 (six) hours as  needed for nausea or vomiting. 30 tablet 1   No current facility-administered medications for this visit.     VITALS:   Blood pressure (!) 150/52, pulse 73, temperature 98 F (36.7 C), temperature source Temporal, resp. rate 16, height 5\' 6"  (1.676 m), weight 153 lb 14.4 oz (69.8 kg), SpO2 96%.  Wt Readings from Last 3 Encounters:  12/03/23 153 lb 14.4 oz (69.8 kg)  11/12/23 147 lb 11.3 oz (67 kg)  11/12/23 147 lb 11.3 oz (67 kg)    Body mass index is 24.84 kg/m.  Performance status (ECOG): 1 - Symptomatic but completely ambulatory  PHYSICAL EXAM:   Physical Exam Constitutional:      General: He is not in acute distress.    Appearance: Normal appearance.  HENT:     Head: Normocephalic and atraumatic.  Eyes:     General: No scleral icterus.    Conjunctiva/sclera: Conjunctivae normal.  Cardiovascular:     Rate and Rhythm: Normal rate and regular rhythm.     Heart sounds: Normal heart sounds.  Pulmonary:     Effort: Pulmonary effort is normal.     Breath sounds: Normal breath sounds.  Abdominal:     General: There is no distension.  Musculoskeletal:     Right lower leg: No edema.     Left lower leg: No edema.  Neurological:     General: No focal deficit present.     Mental Status: He is alert and  oriented to person, place, and time.  Psychiatric:        Mood and Affect: Mood normal.        Behavior: Behavior normal.        Thought Content: Thought content normal.      LABORATORY DATA:   I have reviewed the data as listed.  Results for orders placed or performed in visit on 12/03/23  Lactate dehydrogenase  Result Value Ref Range   LDH 237 (H) 98 - 192 U/L  Comprehensive metabolic panel  Result Value Ref Range   Sodium 135 135 - 145 mmol/L   Potassium 4.6 3.5 - 5.1 mmol/L   Chloride 101 98 - 111 mmol/L   CO2 28 22 - 32 mmol/L   Glucose, Bld 354 (H) 70 - 99 mg/dL   BUN 28 (H) 8 - 23 mg/dL   Creatinine, Ser 5.62 0.61 - 1.24 mg/dL   Calcium 9.1 8.9 - 13.0 mg/dL   Total Protein 5.9 (L) 6.5 - 8.1 g/dL   Albumin 3.7 3.5 - 5.0 g/dL   AST 15 15 - 41 U/L   ALT 23 0 - 44 U/L   Alkaline Phosphatase 115 38 - 126 U/L   Total Bilirubin 0.7 <1.2 mg/dL   GFR, Estimated >86 >57 mL/min   Anion gap 6 5 - 15  CBC with Differential/Platelet  Result Value Ref Range   WBC 15.3 (H) 4.0 - 10.5 K/uL   RBC 4.41 4.22 - 5.81 MIL/uL   Hemoglobin 13.6 13.0 - 17.0 g/dL   HCT 84.6 (L) 96.2 - 95.2 %   MCV 88.0 80.0 - 100.0 fL   MCH 30.8 26.0 - 34.0 pg   MCHC 35.1 30.0 - 36.0 g/dL   RDW 84.1 32.4 - 40.1 %   Platelets 117 (L) 150 - 400 K/uL   nRBC 0.0 0.0 - 0.2 %   Neutrophils Relative % 87 %   Neutro Abs 13.3 (H) 1.7 - 7.7 K/uL   Lymphocytes Relative 6 %   Lymphs Abs  1.0 0.7 - 4.0 K/uL   Monocytes Relative 6 %   Monocytes Absolute 0.9 0.1 - 1.0 K/uL   Eosinophils Relative 0 %   Eosinophils Absolute 0.0 0.0 - 0.5 K/uL   Basophils Relative 0 %   Basophils Absolute 0.0 0.0 - 0.1 K/uL   WBC Morphology RARE METAMYELOCYTE    RBC Morphology MORPHOLOGY UNREMARKABLE    Smear Review FEW LARGE PLATELETS    Immature Granulocytes 1 %   Abs Immature Granulocytes 0.17 (H) 0.00 - 0.07 K/uL     RADIOGRAPHIC STUDIES:  I have personally reviewed the radiological images as listed and agree with the  findings in the report.  MR ABDOMEN W WO CONTRAST Result Date: 11/15/2023 CLINICAL DATA:  Characterize right lobe of the liver lesion, history of metastatic lung cancer EXAM: MRI ABDOMEN WITHOUT AND WITH CONTRAST TECHNIQUE: Multiplanar multisequence MR imaging of the abdomen was performed both before and after the administration of intravenous contrast. CONTRAST:  7mL GADAVIST GADOBUTROL 1 MMOL/ML IV SOLN COMPARISON:  CT chest abdomen pelvis, 11/13/2023 FINDINGS: Lower chest: No acute abnormality.  Cardiomegaly. Hepatobiliary: Lesion in anterior hepatic segment IV measuring 2.2 x 1.9 cm, with transient early arterial hyperenhancement and near isoenhancement on remaining contrast phases (series 11, image 35). Minimal if any underlying diffusion restriction (series 6, image 84). Similar lesion with early arterial hyperenhancement in the posterior liver dome, hepatic segment VII, measuring 1.6 x 0.9 cm (series 11, image 18). No gallstones. Mild dilatation of the common bile duct measuring up to 0.9 cm in caliber, without calculus or other obstruction identified to the ampulla, this appearance unchanged compared prior examinations. Pancreas: Unremarkable. No pancreatic ductal dilatation or surrounding inflammatory changes. Spleen: Normal in size without significant abnormality. Adrenals/Urinary Tract: Adrenal glands are unremarkable. Simple, benign bilateral renal cortical cysts, for which no further follow-up or characterization required. Kidneys are otherwise normal, without obvious renal calculi, solid lesion, or hydronephrosis. Stomach/Bowel: Stomach is within normal limits. No evidence of bowel wall thickening, distention, or inflammatory changes. Vascular/Lymphatic: Aortic atherosclerosis. No enlarged abdominal lymph nodes. Other: No abdominal wall hernia or abnormality. No ascites. Musculoskeletal: No acute or significant osseous findings. IMPRESSION: 1. Lesion in anterior hepatic segment IV measuring 2.2 x  1.9 cm, with transient early arterial hyperenhancement and near isoenhancement on remaining contrast phases. Minimal if any underlying diffusion restriction. Similar lesion with early arterial hyperenhancement in the posterior liver dome, hepatic segment VII, measuring 1.6 x 0.9 cm. Imaging characteristics strongly favor incidental, benign focal nodular hyperplasia rather than hepatic metastasis, however close attention on follow-up is warranted in the setting of known metastatic malignancy. 2. Mild dilatation of the common bile duct measuring up to 0.9 cm in caliber, without calculus or other obstruction identified to the ampulla, this appearance unchanged compared prior examinations and most likely benign. 3. Cardiomegaly. Aortic Atherosclerosis (ICD10-I70.0). Electronically Signed   By: Jearld Lesch M.D.   On: 11/15/2023 18:45   MR BRAIN W WO CONTRAST Result Date: 11/15/2023 CLINICAL DATA:  Right lung squamous cell carcinoma, double vision and dizziness, metastatic disease evaluation EXAM: MRI HEAD WITHOUT AND WITH CONTRAST TECHNIQUE: Multiplanar, multiecho pulse sequences of the brain and surrounding structures were obtained without and with intravenous contrast. CONTRAST:  7mL GADAVIST GADOBUTROL 1 MMOL/ML IV SOLN COMPARISON:  11/13/2023 CT head, 09/01/2021 MRI head FINDINGS: Brain: Peripherally enhancing, partially cystic mass in the left occipital lobe measures up to 2.6 x 2.6 x 2.1 cm (AP x TR x CC) (series 18, image 62 and series 100,  image 211). This mass associated with surrounding T2 hyperintense signal, likely edema, with mild mass effect on the left occipital horn. Additional peripherally enhancing, partially cystic mass in the right cerebellum, which measures up to 3.0 x 3.3 x 2.2 cm (AP x TR x CC) (series 18, image 39 and series 100, image 165). This mass is also so seated with surrounding T2 hyperintense signal, likely edema, with mass effect on the fourth ventricle, right pons, and right  medulla. No restricted diffusion to suggest acute or subacute infarct. No acute hemorrhage or midline shift. No hydrocephalus or extra-axial collection. Pituitary and craniocervical junction within normal limits. Some hemosiderin deposition is associated with the aforementioned masses. No other hemosiderin deposition to suggest remote hemorrhage. Normal cerebral volume for age. 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 Scattered T2 hyperintense signal in the periventricular white matter, likely the sequela of mild chronic small vessel ischemic disease. Vascular: Normal arterial flow voids. Normal arterial and venous enhancement. Skull and upper cervical spine: Normal marrow signal. Sinuses/Orbits: Left maxillary mucous retention cyst. Otherwise overall clear paranasal sinuses. No acute finding in the orbits. Other: The mastoid air cells are well aerated. IMPRESSION: Peripherally enhancing, partially cystic masses in the left occipital lobe and right cerebellum, consistent with metastatic disease. The right cerebellar mass is associated with mass effect on the fourth ventricle, right pons, and right medulla. No hydrocephalus. Electronically Signed   By: Wiliam Ke M.D.   On: 11/15/2023 14:52   IR IMAGING GUIDED PORT INSERTION Result Date: 11/15/2023 CLINICAL DATA:  Metastatic lung cancer EXAM: RIGHT INTERNAL JUGULAR SINGLE LUMEN POWER PORT CATHETER INSERTION Date:  11/15/2023 11/15/2023 11:11 am Radiologist:  M. Ruel Favors, MD Guidance:  Ultrasound fluoroscopic MEDICATIONS: 1% lidocaine local with epinephrine ANESTHESIA/SEDATION: Versed 1.5 mg IV; Fentanyl 50 mcg IV; Moderate Sedation Time:  23 minute The patient was continuously monitored during the procedure by the interventional radiology nurse under my direct supervision. FLUOROSCOPY: 0 minutes, 54 seconds (6 mGy) COMPLICATIONS: None immediate. CONTRAST:  None. PROCEDURE: Informed consent was obtained from the patient following explanation of the  procedure, risks, benefits and alternatives. The patient understands, agrees and consents for the procedure. All questions were addressed. A time out was performed. Maximal barrier sterile technique utilized including caps, mask, sterile gowns, sterile gloves, large sterile drape, hand hygiene, and 2% chlorhexidine scrub. Under sterile conditions and local anesthesia, right internal jugular micropuncture venous access was performed. Access was performed with ultrasound. Images were obtained for documentation of the patent right internal jugular vein. A guide wire was inserted followed by a transitional dilator. This allowed insertion of a guide wire and catheter into the IVC. Measurements were obtained from the SVC / RA junction back to the right IJ venotomy site. In the right infraclavicular chest, a subcutaneous pocket was created over the second anterior rib. This was done under sterile conditions and local anesthesia. 1% lidocaine with epinephrine was utilized for this. A 2.5 cm incision was made in the skin. Blunt dissection was performed to create a subcutaneous pocket over the right pectoralis major muscle. The pocket was flushed with saline vigorously. There was adequate hemostasis. The port catheter was assembled and checked for leakage. The port catheter was secured in the pocket with two retention sutures. The tubing was tunneled subcutaneously to the right venotomy site and inserted into the SVC/RA junction through a valved peel-away sheath. Position was confirmed with fluoroscopy. Images were obtained for documentation. The patient tolerated the procedure well. No immediate complications. Incisions were  closed in a two layer fashion with 4 - 0 Vicryl suture. Dermabond was applied to the skin. The port catheter was accessed, blood was aspirated followed by saline and heparin flushes. Needle was removed. A dry sterile dressing was applied. IMPRESSION: Ultrasound and fluoroscopically guided right internal  jugular single lumen power port catheter insertion. Tip in the SVC/RA junction. Catheter ready for use. Electronically Signed   By: Judie Petit.  Shick M.D.   On: 11/15/2023 12:26   US Abdomen Limited RUQ (LIVER/GB) Result Date: 11/14/2023 CLINICAL DATA:  Liver lesion. EXAM: ULTRASOUND ABDOMEN LIMITED RIGHT UPPER QUADRANT COMPARISON:  CT 11/13/2023. FINDINGS: Gallbladder: No gallstones or wall thickening visualized. No sonographic Murphy sign noted by sonographer. Common bile duct: Diameter: 4 mm Liver: Echogenic hepatic parenchyma consistent with fatty infiltration. There is hypoechoic areas seen in the liver measuring 17 mm. Portal vein is patent on color Doppler imaging with normal direction of blood flow towards the liver. Other: None. IMPRESSION: No gallstones or ductal dilatation. Fatty liver infiltration. 17 mm hypoechoic area seen in the liver. Indeterminate lesion by ultrasound. By CT this could be a hemangioma. Recommend dedicated MRI with and without contrast with dynamic exam when clinically appropriate to confirm etiology Electronically Signed   By: Karen Kays M.D.   On: 11/14/2023 13:28   CT HEAD W & WO CONTRAST ( ) Result Date: 11/14/2023 CLINICAL DATA:  Initial evaluation for brain/CNS neoplasm, monitor. EXAM: CT HEAD WITHOUT AND WITH CONTRAST TECHNIQUE: Contiguous axial images were obtained from the base of the skull through the vertex without and with intravenous contrast. RADIATION DOSE REDUCTION: This exam was performed according to the departmental dose-optimization program which includes automated exposure control, adjustment of the mA and/or kV according to patient size and/or use of iterative reconstruction technique. CONTRAST:  75mL OMNIPAQUE IOHEXOL 350 MG/ML SOLN COMPARISON:  Prior CT from 11/12/2023. FINDINGS: Brain: Partially cystic peripherally enhancing mass centered at the left occipital cortex measures 2.4 x 2.3 x 2.6 cm (series 9, image 15). Surrounding vasogenic edema without  significant regional mass effect. Additional partially cystic heterogeneous peripherally enhancing mass positioned at the inferior right cerebellar hemisphere measures 3.5 x 2.3 x 3.2 cm (series 9, image 22). Surrounding edema with regional mass effect within the right posterior fossa. Partial effacement of the fourth ventricle and fourth ventricular outflow tract. No hydrocephalus. Basilar cisterns are mildly crowded but remain patent. These lesions demonstrate some intrinsic hyperdensity, which could reflect blood products. 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 No other visible mass lesion or abnormal enhancement. No other acute intracranial hemorrhage. No acute cortically based infarct. No extra-axial fluid collection. Small remote left cerebellar infarct noted. Vascular: No abnormal hyperdense vessel seen prior to contrast administration. Normal intravascular enhancement seen throughout the major intracranial vascular structures following contrast administration. Skull: Scalp soft tissues within normal limits. Calvarium intact. No focal osseous lesions. Sinuses/Orbits: Globes and orbital soft tissues within normal limits. Mucosal thickening about the left greater than right maxillary sinuses. No significant mastoid effusion. Other: None. IMPRESSION: 1. 2.6 cm peripherally enhancing mass centered at the left occipital cortex, with additional 3.5 cm enhancing mass at the inferior right cerebellum. 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 2. Associated vasogenic edema and regional mass effect about the right cerebellar lesion with partial effacement of the fourth ventricle. No hydrocephalus. 3. No other acute intracranial abnormality. Electronically Signed   By: Rise Mu M.D.   On: 11/14/2023 03:18   CT CHEST ABDOMEN PELVIS W CONTRAST Result Date: 11/13/2023 CLINICAL  DATA:  History of right lung cancer status post radiation with new suspected brain  metastases. * Tracking Code: BO * EXAM: CT CHEST, ABDOMEN, AND PELVIS WITH CONTRAST TECHNIQUE: Multidetector CT imaging of the chest, abdomen and pelvis was performed following the standard protocol during bolus administration of intravenous contrast. RADIATION DOSE REDUCTION: This exam was performed according to the departmental dose-optimization program which includes automated exposure control, adjustment of the mA and/or kV according to patient size and/or use of iterative reconstruction technique. CONTRAST:  75mL OMNIPAQUE IOHEXOL 350 MG/ML SOLN COMPARISON:  CT chest dated 03/17/2022, CT abdomen and pelvis dated 05/08/2017 FINDINGS: CT CHEST FINDINGS Cardiovascular: Left anterior chest wall ICD with leads terminating in the right atrium and ventricle. Normal heart size. No significant pericardial fluid/thickening. Great vessels are normal in course and caliber. No central pulmonary emboli. Coronary artery calcifications. Mediastinum/Nodes: Imaged thyroid gland without nodules meeting criteria for imaging follow-up by size. Normal esophagus. No pathologically enlarged axillary, supraclavicular, mediastinal, or hilar lymph nodes. Lungs/Pleura: The central airways are patent. Severe upper lobe predominant centrilobular and paraseptal emphysema. New irregular peripheral left upper lobe nodule measuring 1.3 x 0.8 cm (5:38). New subpleural right upper lobe 5 x 2 mm nodule (5:44). Decreased size of subpleural medial right lower lobe nodule measuring 1.1 x 0.6 cm (5:77), previously 1.4 x 1.0 cm. No pneumothorax. No pleural effusion. Musculoskeletal: No acute or abnormal lytic or blastic osseous lesions. Multilevel degenerative changes of the thoracic spine. CT ABDOMEN PELVIS FINDINGS Hepatobiliary: Segment 4 enhancing lesion measures 2.0 x 1.8 cm (3:57). No intra or extrahepatic biliary ductal dilation. Normal gallbladder. Pancreas: No focal lesions or main ductal dilation. Spleen: Normal in size without focal  abnormality. Scattered calcified granulomata. Adrenals/Urinary Tract: No adrenal nodules. Left extrarenal pelvis. No suspicious renal mass on this noncontrast enhanced examination, calculi, or hydronephrosis. No focal bladder wall thickening. Stomach/Bowel: Normal appearance of the stomach. No evidence of bowel wall thickening, distention, or inflammatory changes. Normal appendix. Vascular/Lymphatic: Aortic atherosclerosis. No enlarged abdominal or pelvic lymph nodes. Reproductive: Prostate is unremarkable. Other: No free fluid, fluid collection, or free air. Musculoskeletal: No acute or abnormal lytic or blastic osseous findings. Postsurgical changes of the anterior abdominal wall. 3.8 x 1.3 cm lipoma in the right gluteal musculature. IMPRESSION: 1. New irregular peripheral left upper lobe pulmonary nodule measuring 1.3 x 0.8 cm and new subpleural right upper lobe 5 x 2 mm nodule, suspicious for pulmonary neoplasm, either primary or metastatic. 2. Continued decrease in size of treated subpleural medial right lower lobe pulmonary nodule measuring 1.1 x 0.6 cm, previously 1.4 x 1.0 cm. 3. Hepatic segment 4 enhancing lesion measures 2.0 x 1.8 cm, suspicious for metastatic disease. 4. Aortic Atherosclerosis (ICD10-I70.0) and Emphysema (ICD10-J43.9). Coronary artery calcifications. Assessment for potential risk factor modification, dietary therapy or pharmacologic therapy may be warranted, if clinically indicated. Electronically Signed   By: Agustin Cree M.D.   On: 11/13/2023 18:00   CT ANGIO HEAD NECK W WO CM Result Date: 11/12/2023 CLINICAL DATA:  vertigo, double vision, R facial droop suspected intraparenchymal EXAM: CT ANGIOGRAPHY HEAD AND NECK WITH AND WITHOUT CONTRAST TECHNIQUE: Multidetector CT imaging of the head and neck was performed using the standard protocol during bolus administration of intravenous contrast. Multiplanar CT image reconstructions and MIPs were obtained to evaluate the vascular anatomy.  Carotid stenosis measurements (when applicable) are obtained utilizing NASCET criteria, using the distal internal carotid diameter as the denominator. RADIATION DOSE REDUCTION: This exam was performed according to the departmental dose-optimization  program which includes automated exposure control, adjustment of the mA and/or kV according to patient size and/or use of iterative reconstruction technique. CONTRAST:  75mL OMNIPAQUE IOHEXOL 350 MG/ML SOLN COMPARISON:  CT chest March 17, 2022.  MRI head September 01, 2021. FINDINGS: CT HEAD FINDINGS Brain: Suspected masses in the left occipital lobe and right posterior fossa, likely metastases. Substantial surrounding edema with mass effect with effacement of the fourth ventricle. No hydrocephalus. No definite superimposed acute large vascular territory infarct. No mass occupying acute hemorrhage. Vascular: No hyperdense vessel identified. Skull: No acute fracture. Sinuses/Orbits: No acute findings. Other: No mastoid effusions. Review of the MIP images confirms the above findings CTA NECK FINDINGS Aortic arch: Aortic atherosclerosis. Great vessel origins are patent. Right carotid system: Atherosclerosis at the carotid bifurcation without greater than 50% stenosis. Left carotid system: Atherosclerosis at the carotid bifurcation without greater than 50% stenosis. Irregularity of the left ICA at the skull base with mildly beaded appearance. Vertebral arteries: Co dominant. No significant (greater than 50%) stenosis. Skeleton: No acute abnormality on limited assessment. Lower cervical degenerative change. Other neck: Approximally 3.1 x 2.5 cm cystic lesion subjacent to the strap musculature and abutting the hyoid and thyroid cartilage. Upper chest: Emphysema. New nodularity and pleural thickening in the left upper lobe, concerning for malignancy. Review of the MIP images confirms the above findings CTA HEAD FINDINGS Anterior circulation: Bilateral intracranial ICAs, MCAs, and  ACAs are patent without proximal high-grade stenosis. Posterior circulation: Bilateral intradural vertebral arteries, basilar artery and bilateral posterior cerebral arteries are patent without proximal high-grade stenosis. Venous sinuses: As permitted by contrast timing, patent. Review of the MIP images confirms the above findings IMPRESSION: 1. Suspected masses in the left occipital lobe and right posterior fossa, concerning for metastases given the clinical history. Recommend MRI with contrast to further evaluate. 2. Substantial surrounding edema with mass effect and effacement of the fourth ventricle. No hydrocephalus at this time. 3. New nodularity and pleural thickening in the left upper lobe, concerning for malignancy. Recommend dedicated CT of the chest with contrast to fully characterize. 4. No emergent large vessel occlusion or proximal high-grade stenosis. 5. 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 A follow-up CT of the neck with contrast is recommended to fully evaluate and help exclude carcinoma. 6. Mild irregularity of the left ICA at the skull base with beaded appearance. This could be secondary to fibromuscular dysplasia or atherosclerosis. 7. Aortic Atherosclerosis (ICD10-I70.0) and Emphysema (ICD10-J43.9). Urgent findings discussed with Dr. Jarold Motto via telephone at 12:18 p.m. Electronically Signed   By: Feliberto Harts M.D.   On: 11/12/2023 12:28   CUP PACEART REMOTE DEVICE CHECK Result Date: 11/10/2023 Scheduled remote reviewed. Normal device function.  Next remote 91 days. [place], [facility]   CODE STATUS:  Code Status History     Date Active Date Inactive Code Status Order ID Comments User Context   11/12/2023 1833 11/16/2023 2150 Do not attempt resuscitation (DNR) PRE-ARREST INTERVENTIONS DESIRED [id number]  Erick Alley, DO Inpatient   11/12/2023 1539  11/12/2023 1833 Limited: Do not attempt resuscitation (DNR) -DNR-LIMITED -Do Not Intubate/DNI  [id number]  Ivery Quale, MD Inpatient   08/31/2021 2000 09/01/2021 2017 Full Code [id number]  John Giovanni, MD ED   11/08/2018 1630 11/09/2018 1527 Full Code [id number]  Hillis Range, MD Inpatient   03/29/2018 2022 03/31/2018 1540 Full Code [id number]  Allayne Butcher, PA-C ED  04/07/2012 2049 04/08/2012 2009 Full Code [id number]  Roselyn Bering, RN ED    Questions for Most Recent Historical Code Status (Order [id number])     Question Answer   If pulseless and not breathing No CPR or chest compressions.   In Pre-Arrest Conditions (Patient Has Pulse and Is Breathing) May intubate, use advanced airway interventions and cardioversion/ACLS medications if appropriate or indicated. May transfer to ICU.   Consent: Discussion documented in EHR or advanced directives reviewed            Orders Placed This Encounter  Procedures   Consent Attestation for Oncology Treatment    The patient is informed of risks, benefits, side-effects of the prescribed oncology treatment. Potential short term and long term side effects and response rates discussed. After a long discussion, the patient made informed decision to proceed.:   Yes   Guardant 360    Standing Status:   Future    Number of Occurrences:   1    Expiration Date:   12/02/2024   CBC with Differential (Cancer Center Only)    Standing Status:   Future    Expected Date:   12/16/2023    Expiration Date:   12/15/2024   CMP (Cancer Center only)    Standing Status:   Future    Expected Date:   12/16/2023    Expiration Date:   12/15/2024   T4    Standing Status:   Future    Expected Date:   12/16/2023    Expiration Date:   12/15/2024   TSH    Standing Status:   Future    Expected Date:   12/16/2023    Expiration Date:   12/15/2024   [facility] PHYSICIAN COMMUNICATION 3    For pathway LOS410: In studies, patients received up to 4 cycles of pembrolizumab +  pemetrexed + carboplatin followed by pembrolizumab up to a total of 35 cycles and pemetrexed indefinitely until disease progression or toxicity. For pathway AYT016: In the [place] al study, patients received maintenance pembrolizumab (up to a total of 35 cycles including induction) and pemetrexed indefinitely until disease progression or toxicity.   ONCBCN PHYSICIAN COMMUNICATION 4    0 Number of doses of carboplatin received at [facility] [place] or outside facility. AP signature of Provider. If patient has received greater than 5 doses of carboplatin, the following pre-medications should be ordered: dexamethasone, diphenhydramine, and formulary histamine H2 antagonist. If patient cannot tolerate oral histamine H2 antagonist, IV may be given.   PHYSICIAN COMMUNICATION ORDER    Required labs: Thyroid function tests at baseline and every 3rd cycle.   ONCBCN PHYSICIAN COMMUNICATION 2    0 Number of doses of carboplatin received at [facility] or outside facility. AP signature of Provider. If patient has received greater than 5 doses of carboplatin, the following pre-medications should be ordered: dexamethasone, diphenhydramine, and formulary histamine H2 antagonist. If patient cannot tolerate oral histamine H2 antagonist, IV may be given.      Future Appointments  Date Time Provider Department Center  12/10/2023  1:30 PM [facility] FLUSH [facility] None  12/10/2023  2:30 PM [facility] INFUSION [facility] None  12/10/2023  2:45 PM Angalena Cousineau, MD [facility] None  12/13/2023  9:15 AM [facility] PALLIATIVE CARE [facility] None  12/28/2023  9:00 AM [facility] TREATMENT [facility] None  02/09/2024  8:45 AM [facility] DEVICE REMOTES [facility]  05/10/2024  8:45 AM [facility] DEVICE REMOTES [facility]      This document was completed utilizing  speech recognition software. Grammatical errors, random word insertions, pronoun errors, and incomplete sentences are an  occasional consequence of this system due to software limitations, ambient noise, and hardware issues. Any formal questions or concerns about the content, text or information contained within the body of this dictation should be directly addressed to the provider for clarification.

## 2023-12-03 NOTE — Assessment & Plan Note (Addendum)
-   Please review HPI above for additional details.  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  -He continue to follow-up with Dr. Delton See at [facility] clinic for oncology follow-up.  Previously imaging studies showed no evidence of disease recurrence, last 1 in August 2024.  -When he presented to the ED on 11/03/2023 with complaints of double vision and dizziness, CT imaging showed left occipital lobe mass and right posterior fossa mass consistent with metastatic disease.  Rest of the workup is as mentioned above.  -He recently completed stereotactic radiosurgery to intracranial metastatic lesions and tolerated the treatments well.  - PET/CT completed on 11/29/2023.  Results pending.  -Today I submitted request for Foundation One testing on the specimen from October 2022.  Also request admitted for Guardant360, liquid biopsy to look for any actionable mutations.  -Given stage IV disease, all treatment options are palliative in nature and not curative in intent.  Patient verbalized understanding of this fact.  - Plan to proceed with palliative systemic chemoimmunotherapy with carboplatin, paclitaxel and pembrolizumab for 4 cycles, followed by restaging imaging, followed by pembrolizumab maintenance.  Pending [facility] authorization for treatments at our facility.  If not, patient will receive treatments at [facility] medical oncology clinic.

## 2023-12-03 NOTE — Assessment & Plan Note (Signed)
-   stereotactic radiotherapy to the brain lesions received under the direction of Dr. Kathrynn Running.   -currently on decadron taper.

## 2023-12-03 NOTE — Progress Notes (Signed)
Foundation One ordered on Case#[id number] electronically.  Order confirmation received for requisition# [id number]  Guardant 360 ordered and molecular biopsy drawn today (12/03/2023).

## 2023-12-05 ENCOUNTER — Other Ambulatory Visit: Payer: Self-pay

## 2023-12-06 NOTE — Progress Notes (Signed)
In patient oncology consult note with Dr Arlana Pouch, outpatient oncology appt note with Dr. Arlana Pouch, and pts PET scan results from 12/16 faxed to Aileen Pilot, [facility] at [phone]. Successful transfer noticed received at 10:09

## 2023-12-09 ENCOUNTER — Other Ambulatory Visit (HOSPITAL_COMMUNITY): Payer: Self-pay | Admitting: Internal Medicine

## 2023-12-09 ENCOUNTER — Other Ambulatory Visit: Payer: Self-pay

## 2023-12-09 DIAGNOSIS — C7931 Secondary malignant neoplasm of brain: Secondary | ICD-10-CM

## 2023-12-09 DIAGNOSIS — C3431 Malignant neoplasm of lower lobe, right bronchus or lung: Secondary | ICD-10-CM

## 2023-12-09 NOTE — Progress Notes (Signed)
Oley Balm, MD  Claudean Kinds; P Ir Procedure Requests; Addo, Safoa A, MD Not safely approachable percutaneously due to small size and location deep to subpleural blebs.  Oley Balm MD IR       Previous Messages    ----- Message ----- From: Claudean Kinds Sent: 12/09/2023   2:15 PM EST To: Claudean Kinds; Ir Procedure Requests Subject: CT lung mass biopsy                            Procedure: CT Lung mass Biopsy  Reason : NSCLC metastic ( presumed) , has treated brain mets. He has a LUL subpleural node that is PET avid . 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

## 2023-12-09 NOTE — Progress Notes (Deleted)
Palliative Medicine [facility]  Telephone:(336) [phone] Fax:([phone]) [phone]   Name: [name] Date: 12/09/2023 MRN: [id number]  DOB: [birth year]  Patient Care Team: Clinic, [facility] as PCP - General Hillis Range, MD (Inactive) as PCP - Electrophysiology (Cardiology) Collier Bullock as Physician Assistant (Cardiology) Meriam Sprague, MD (Inactive) as Consulting Physician (Cardiology) Dawley, Alan Mulder, DO as Consulting Physician    REASON FOR CONSULTATION: Juan Norris is a 72 y.o. 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  Palliative ask to see for symptom management and goals of care.    SOCIAL HISTORY:     reports that he has been smoking cigarettes. He has a 70.5 pack-year smoking history. He has never used smokeless tobacco. He reports that he does not currently use alcohol. He reports that he does not currently use drugs.  ADVANCE DIRECTIVES:  None on file  CODE STATUS: Full code  PAST MEDICAL HISTORY: Past Medical History:  Diagnosis Date  . Acute renal insufficiency   . Anginal pain (HCC)   . Chronic lower back pain    "due to 3 ruptured discs in my back" (11/08/2018)  . High cholesterol   . History of kidney stones 1982   "passed them"  . History of radiation therapy    Right lung- 11/03/21-11/14/21- Dr. Joylene Draft  . Hypertension   . Lung cancer (HCC)   . Myocardial infarction (HCC) 03/2018  . Systolic heart failure (HCC)   . Tobacco use     PAST SURGICAL HISTORY:  Past Surgical History:  Procedure Laterality Date  . BIV ICD INSERTION CRT-D N/A 11/08/2018    [employer]onic biventricular ICD implantation by Dr Johney Frame with His Bundle pacing and a MDT 3830 lead placed into the LV port.   Marland Kitchen BRONCHIAL BIOPSY  10/13/2021   Procedure: BRONCHIAL BIOPSIES;  Surgeon: Leslye Peer, MD;  Location: [facility]  ENDOSCOPY;  Service: Pulmonary;;  . BRONCHIAL BRUSHINGS  10/13/2021   Procedure: BRONCHIAL BRUSHINGS;  Surgeon: Leslye Peer, MD;  Location: [facility] ENDOSCOPY;  Service: Pulmonary;;  . BRONCHIAL NEEDLE ASPIRATION BIOPSY  10/13/2021   Procedure: BRONCHIAL NEEDLE ASPIRATION BIOPSIES;  Surgeon: Leslye Peer, MD;  Location: [facility] ENDOSCOPY;  Service: Pulmonary;;  . BRONCHIAL WASHINGS  10/13/2021   Procedure: BRONCHIAL WASHINGS;  Surgeon: Leslye Peer, MD;  Location: [facility] ENDOSCOPY;  Service: Pulmonary;;  . CARDIAC CATHETERIZATION    . FIDUCIAL MARKER PLACEMENT  10/13/2021   Procedure: FIDUCIAL MARKER PLACEMENT;  Surgeon: Leslye Peer, MD;  Location: [facility] ENDOSCOPY;  Service: Pulmonary;;  . INGUINAL HERNIA REPAIR Bilateral   . IR IMAGING GUIDED PORT INSERTION  11/15/2023  . KNEE SURGERY Left 1990s   "growth on the back of my knee removed"  . LEFT HEART CATH AND CORONARY ANGIOGRAPHY N/A 03/30/2018   Procedure: LEFT HEART CATH AND CORONARY ANGIOGRAPHY;  Surgeon: Kathleene Hazel, MD;  Location: [facility] INVASIVE CV LAB;  Service: Cardiovascular;  Laterality: N/A;  . VIDEO BRONCHOSCOPY WITH ENDOBRONCHIAL NAVIGATION N/A 10/13/2021   Procedure: ROBOTIC VIDEO BRONCHOSCOPY WITH ENDOBRONCHIAL NAVIGATION;  Surgeon: Leslye Peer, MD;  Location: [facility] ENDOSCOPY;  Service: Pulmonary;  Laterality: N/A;  . 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 (HCC)   12/03/2023 Cancer Staging   Staging form: Lung, AJCC 8th Edition - Clinical: Stage IVB (cTX, cNX, cM1c) - Signed by Meryl Crutch, MD on 12/03/2023   12/16/2023 -  Chemotherapy   Patient is on Treatment Plan : LUNG NSCLC  Carboplatin (6) + Paclitaxel (200) + Pembrolizumab (200) D1 q21d x 4 cycles / Pembrolizumab (200) Maintenance D1 q21d     Metastatic cancer to brain (HCC)  11/13/2023 Initial Diagnosis   Metastatic cancer to brain (HCC)   12/16/2023 -  Chemotherapy   Patient is on Treatment Plan : LUNG NSCLC Carboplatin (6) + Paclitaxel (200) + Pembrolizumab (200) D1 q21d x 4 cycles / Pembrolizumab (200) Maintenance D1 q21d       ALLERGIES:  has no known allergies.  MEDICATIONS:  Current Outpatient Medications  Medication Sig Dispense Refill  . aspirin 81 MG EC tablet Take 1 tablet (81 mg total) by mouth daily. 30 tablet 6  . atorvastatin (LIPITOR) 40 MG tablet Take 1 tablet (40 mg total) by mouth at bedtime.    . carvedilol (COREG) 25 MG tablet Take 12.5 mg by mouth daily.    . chlorproMAZINE (THORAZINE) 25 MG tablet Take 1 tablet (25 mg total) by mouth 3 (three) times daily as needed (Hiccups). 45 tablet 2  . dexamethasone (DECADRON) 4 MG tablet Take 1 tablet (4 mg total) by mouth every 8 (eight) hours. 90 tablet 1  . dexamethasone (DECADRON) 4 MG tablet Take 1 tab 2 times daily starting day before pemetrexed. Then take 2 tabs daily x 3 days starting day after carboplatin. Take with food. 30 tablet 1  . dexamethasone (DECADRON) 4 MG tablet Take 2 tablets (8mg ) by mouth daily starting the day after carboplatin for 3 days. Take with food 30 tablet 1  . furosemide (LASIX) 20 MG tablet Take 1 tablet (20 mg total) by mouth daily. As directed 30 tablet 11  . lidocaine-prilocaine (EMLA) cream Apply to affected area once 30 g 3  . lidocaine-prilocaine (EMLA) cream Apply to affected area once 30 g 3  . metFORMIN (GLUCOPHAGE) 500 MG tablet Take 1 tablet (500 mg total) by mouth 2 (two) times daily with a meal. 60 tablet 0  . metoCLOPramide (REGLAN) 5 MG tablet Take 1 tablet (5 mg total) by mouth every 8 (eight) hours as needed for nausea. 30 tablet 0  . Multiple Vitamin (MULTIVITAMIN WITH MINERALS) TABS tablet Take  1 tablet by mouth daily.    . nicotine (NICODERM CQ - DOSED IN MG/24 HOURS) 21 mg/24hr patch Place 21 mg onto the skin daily. Last patch was applied 3-4 days ago, no patch is on today 11/12/2023.    . ondansetron (ZOFRAN) 8 MG tablet Take 1 tablet (8 mg total) by mouth every 8 (eight) hours as needed for nausea or vomiting. Start on the third day after carboplatin. 30 tablet 1  . ondansetron (ZOFRAN) 8 MG tablet Take 1 tablet (8 mg total) by mouth every 8 (eight) hours as needed for nausea or vomiting. Start on the third day after carboplatin. 30 tablet 1  . prochlorperazine (COMPAZINE) 10 MG tablet Take 1 tablet (10 mg total) by mouth every 6 (six) hours as needed for nausea or vomiting. 30 tablet 1  . prochlorperazine (COMPAZINE) 10 MG tablet Take 1 tablet (10 mg total) by mouth every 6 (six) hours as needed for nausea  or vomiting. 30 tablet 1  . spironolactone (ALDACTONE) 25 MG tablet Take 0.5 tablets (12.5 mg total) by mouth daily. 15 tablet 5   No current facility-administered medications for this visit.    VITAL SIGNS: There were no vitals taken for this visit. There were no vitals filed for this visit.  Estimated body mass index is 24.84 kg/m as calculated from the following:   Height as of 12/03/23: 5\' 6"  (1.676 m).   Weight as of 12/03/23: 153 lb 14.4 oz (69.8 kg).  LABS: CBC:    Component Value Date/Time   WBC 15.3 (H) 12/03/2023 1052   HGB 13.6 12/03/2023 1052   HGB 13.6 02/12/2021 0802   HCT 38.8 (L) 12/03/2023 1052   HCT 40.1 02/12/2021 0802   PLT 117 (L) 12/03/2023 1052   PLT 219 02/12/2021 0802   MCV 88.0 12/03/2023 1052   MCV 88 02/12/2021 0802   NEUTROABS 13.3 (H) 12/03/2023 1052   NEUTROABS 8.5 (H) 09/04/2019 1028   LYMPHSABS 1.0 12/03/2023 1052   LYMPHSABS 2.0 09/04/2019 1028   MONOABS 0.9 12/03/2023 1052   EOSABS 0.0 12/03/2023 1052   EOSABS 0.0 09/04/2019 1028   BASOSABS 0.0 12/03/2023 1052   BASOSABS 0.0 09/04/2019 1028   Comprehensive Metabolic  Panel:    Component Value Date/Time   NA 135 12/03/2023 1052   NA 142 11/19/2022 0920   K 4.6 12/03/2023 1052   CL 101 12/03/2023 1052   CO2 28 12/03/2023 1052   BUN 28 (H) 12/03/2023 1052   BUN 21 11/19/2022 0920   CREATININE 1.15 12/03/2023 1052   GLUCOSE 354 (H) 12/03/2023 1052   CALCIUM 9.1 12/03/2023 1052   AST 15 12/03/2023 1052   ALT 23 12/03/2023 1052   ALKPHOS 115 12/03/2023 1052   BILITOT 0.7 12/03/2023 1052   BILITOT 0.5 02/12/2021 0802   PROT 5.9 (L) 12/03/2023 1052   PROT 7.1 02/12/2021 0802   ALBUMIN 3.7 12/03/2023 1052   ALBUMIN 4.1 02/12/2021 0802    RADIOGRAPHIC STUDIES: MR ABDOMEN W WO CONTRAST  Result Date: 11/15/2023 CLINICAL DATA:  Characterize right lobe of the liver lesion, history of metastatic lung cancer EXAM: MRI ABDOMEN WITHOUT AND WITH CONTRAST TECHNIQUE: Multiplanar multisequence MR imaging of the abdomen was performed both before and after the administration of intravenous contrast. CONTRAST:  7mL GADAVIST GADOBUTROL 1 MMOL/ML IV SOLN COMPARISON:  CT chest abdomen pelvis, 11/13/2023 FINDINGS: Lower chest: No acute abnormality.  Cardiomegaly. Hepatobiliary: Lesion in anterior hepatic segment IV measuring 2.2 x 1.9 cm, with transient early arterial hyperenhancement and near isoenhancement on remaining contrast phases (series 11, image 35). Minimal if any underlying diffusion restriction (series 6, image 84). Similar lesion with early arterial hyperenhancement in the posterior liver dome, hepatic segment VII, measuring 1.6 x 0.9 cm (series 11, image 18). No gallstones. Mild dilatation of the common bile duct measuring up to 0.9 cm in caliber, without calculus or other obstruction identified to the ampulla, this appearance unchanged compared prior examinations. Pancreas: Unremarkable. No pancreatic ductal dilatation or surrounding inflammatory changes. Spleen: Normal in size without significant abnormality. Adrenals/Urinary Tract: Adrenal glands are unremarkable.  Simple, benign bilateral renal cortical cysts, for which no further follow-up or characterization required. Kidneys are otherwise normal, without obvious renal calculi, solid lesion, or hydronephrosis. Stomach/Bowel: Stomach is within normal limits. No evidence of bowel wall thickening, distention, or inflammatory changes. Vascular/Lymphatic: Aortic atherosclerosis. No enlarged abdominal lymph nodes. Other: No abdominal wall hernia or abnormality. No ascites. Musculoskeletal: No acute or significant osseous  findings. IMPRESSION: 1. Lesion in anterior hepatic segment IV measuring 2.2 x 1.9 cm, with transient early arterial hyperenhancement and near isoenhancement on remaining contrast phases. Minimal if any underlying diffusion restriction. Similar lesion with early arterial hyperenhancement in the posterior liver dome, hepatic segment VII, measuring 1.6 x 0.9 cm. Imaging characteristics strongly favor incidental, benign focal nodular hyperplasia rather than hepatic metastasis, however close attention on follow-up is warranted in the setting of known metastatic malignancy. 2. Mild dilatation of the common bile duct measuring up to 0.9 cm in caliber, without calculus or other obstruction identified to the ampulla, this appearance unchanged compared prior examinations and most likely benign. 3. Cardiomegaly. Aortic Atherosclerosis (ICD10-I70.0). Electronically Signed   By: Jearld Lesch M.D.   On: 11/15/2023 18:45   MR BRAIN W WO CONTRAST  Result Date: 11/15/2023 CLINICAL DATA:  Right lung squamous cell carcinoma, double vision and dizziness, metastatic disease evaluation EXAM: MRI HEAD WITHOUT AND WITH CONTRAST TECHNIQUE: Multiplanar, multiecho pulse sequences of the brain and surrounding structures were obtained without and with intravenous contrast. CONTRAST:  7mL GADAVIST GADOBUTROL 1 MMOL/ML IV SOLN COMPARISON:  11/13/2023 CT head, 09/01/2021 MRI head FINDINGS: Brain: Peripherally enhancing, partially cystic  mass in the left occipital lobe measures up to 2.6 x 2.6 x 2.1 cm (AP x TR x CC) (series 18, image 62 and series 100, image 211). This mass associated with surrounding T2 hyperintense signal, likely edema, with mild mass effect on the left occipital horn. Additional peripherally enhancing, partially cystic mass in the right cerebellum, which measures up to 3.0 x 3.3 x 2.2 cm (AP x TR x CC) (series 18, image 39 and series 100, image 165). This mass is also so seated with surrounding T2 hyperintense signal, likely edema, with mass effect on the fourth ventricle, right pons, and right medulla. No restricted diffusion to suggest acute or subacute infarct. No acute hemorrhage or midline shift. No hydrocephalus or extra-axial collection. Pituitary and craniocervical junction within normal limits. Some hemosiderin deposition is associated with the aforementioned masses. No other hemosiderin deposition to suggest remote hemorrhage. Normal cerebral volume for age. 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 Scattered T2 hyperintense signal in the periventricular white matter, likely the sequela of mild chronic small vessel ischemic disease. Vascular: Normal arterial flow voids. Normal arterial and venous enhancement. Skull and upper cervical spine: Normal marrow signal. Sinuses/Orbits: Left maxillary mucous retention cyst. Otherwise overall clear paranasal sinuses. No acute finding in the orbits. Other: The mastoid air cells are well aerated. IMPRESSION: Peripherally enhancing, partially cystic masses in the left occipital lobe and right cerebellum, consistent with metastatic disease. The right cerebellar mass is associated with mass effect on the fourth ventricle, right pons, and right medulla. No hydrocephalus. Electronically Signed   By: Wiliam Ke M.D.   On: 11/15/2023 14:52    PERFORMANCE STATUS (ECOG) : {CHL ONC ECOG WU:[id number]}  Review of Systems Unless otherwise noted, a complete review of systems is  negative.  Physical Exam General: NAD Cardiovascular: regular rate and rhythm Pulmonary: clear ant fields Abdomen: soft, nontender, + bowel sounds Extremities: no edema, no joint deformities Skin: no rashes Neurological: Alert and oriented x3  IMPRESSION: *** I introduced myself, Oprah Camarena RN, and Palliative's role in collaboration with the oncology team. Concept of Palliative Care was introduced as specialized medical care for people and their families living with serious illness.  It focuses on providing relief from the symptoms and stress of a serious illness.  The goal is to improve  quality of life for both the patient and the family. Values and goals of care important to patient and family were attempted to be elicited.    We discussed *** current illness and what it means in the larger context of *** on-going co-morbidities. Natural disease trajectory and expectations were discussed.  I discussed the importance of continued conversation with family and their medical providers regarding overall plan of care and treatment options, ensuring decisions are within the context of the patients values and GOCs.  PLAN: Established therapeutic relationship. Education provided on palliative's role in collaboration with their Oncology/Radiation team. I will plan to see patient back in 2-4 weeks in collaboration to other oncology appointments.    Patient expressed understanding and was in agreement with this plan. He also understands that He can call the clinic at any time with any questions, concerns, or complaints.   Thank you for your referral and allowing Palliative to assist in Mr. Srihan Matsuoka Marcou's care.   Number and complexity of problems addressed: ***HIGH - 1 or more chronic illnesses with SEVERE exacerbation, progression, or side effects of treatment - advanced cancer, pain. Any controlled substances utilized were prescribed in the context of palliative care.   Visit consisted of  counseling and education dealing with the complex and emotionally intense issues of symptom management and palliative care in the setting of serious and potentially life-threatening illness.  Signed by: Willette Alma, AGPCNP-BC Palliative Medicine Team/[facility]   *Please note that this is a verbal dictation therefore any spelling or grammatical errors are due to the "Dragon [employer] One" system interpretation.

## 2023-12-10 ENCOUNTER — Inpatient Hospital Stay: Payer: No Typology Code available for payment source | Admitting: Oncology

## 2023-12-10 ENCOUNTER — Inpatient Hospital Stay: Payer: No Typology Code available for payment source

## 2023-12-10 ENCOUNTER — Inpatient Hospital Stay: Payer: Medicare PPO

## 2023-12-10 NOTE — Progress Notes (Signed)
Biopsy and path reports from Oct 2022 faxed to Aileen Pilot, [facility] Oncology Nurse Navigator

## 2023-12-10 NOTE — Progress Notes (Signed)
No ICM remote transmission received for 10/25/2023 or 12/06/2023 and next ICM transmission scheduled for 12/27/2023.

## 2023-12-13 ENCOUNTER — Other Ambulatory Visit: Payer: Self-pay | Admitting: Oncology

## 2023-12-13 ENCOUNTER — Inpatient Hospital Stay: Payer: No Typology Code available for payment source | Admitting: Nurse Practitioner

## 2023-12-14 DIAGNOSIS — C3431 Malignant neoplasm of lower lobe, right bronchus or lung: Secondary | ICD-10-CM | POA: Diagnosis not present

## 2023-12-16 ENCOUNTER — Telehealth: Payer: Self-pay | Admitting: Emergency Medicine

## 2023-12-16 NOTE — Telephone Encounter (Signed)
 Patient is scheduled 12/17/2023 with Maralyn Sago, [place] RN's states all imaging was sent 12/09/2023. Referral has been scanned into the chart

## 2023-12-16 NOTE — Telephone Encounter (Addendum)
 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  Last office visit was 05/28/2022 with Dr. Shelah with instructions to follow up in 12 months (June 2024), [facility] denied [facility] to extend [facility] for 05/2023 visit.  First opening with RB is 12/28/2023 at 4pm. Juan Norris has openings 12/17/2023 and 12/21/2023.   Please advise.

## 2023-12-16 NOTE — Telephone Encounter (Signed)
 Ok to offer appt with Kandice Robinsons, NP for 1/3, 1/7 or 1/8. Just make sure we have all the records from the [place] needed for provider at time of visit.

## 2023-12-17 ENCOUNTER — Ambulatory Visit (INDEPENDENT_AMBULATORY_CARE_PROVIDER_SITE_OTHER): Payer: No Typology Code available for payment source | Admitting: Acute Care

## 2023-12-17 ENCOUNTER — Encounter: Payer: Self-pay | Admitting: Acute Care

## 2023-12-17 ENCOUNTER — Encounter: Payer: Self-pay | Admitting: Emergency Medicine

## 2023-12-17 VITALS — BP 124/60 | HR 71 | Ht 66.0 in | Wt 145.0 lb

## 2023-12-17 DIAGNOSIS — Z87891 Personal history of nicotine dependence: Secondary | ICD-10-CM

## 2023-12-17 DIAGNOSIS — Z85841 Personal history of malignant neoplasm of brain: Secondary | ICD-10-CM | POA: Diagnosis not present

## 2023-12-17 DIAGNOSIS — R918 Other nonspecific abnormal finding of lung field: Secondary | ICD-10-CM

## 2023-12-17 DIAGNOSIS — Z85118 Personal history of other malignant neoplasm of bronchus and lung: Secondary | ICD-10-CM | POA: Diagnosis not present

## 2023-12-17 NOTE — Patient Instructions (Signed)
 It is good to see you today. We will schedule you for a bronchoscopy with biopsies to determine if the new left lung mass is cancer . We are tentatively looking at 12/27/2023 as the date. You will get a letter today with additional information about the procedure.  We have discussed the risks to include bleeding, infection,lung puncture and adverse anesthetic events. These are rare but we have to let you know.  You will need someone with you to bring you to the procedure, stay with you while it is being done, and drive you home. They will also need to stay with you for 24 hours after the procedure. You will get a call if we need an additional Chest for procedure planning. You will follow up with me 1 week after the procedure to review the biopsy results, and make sure you are doing well.  Call if you need us  sooner. Please contact office for sooner follow up if symptoms do not improve or worsen or seek emergency care

## 2023-12-17 NOTE — Progress Notes (Signed)
 History of Present Illness Juan Norris is a 73 y.o. male former smoker with additional PMH of CAD with systolic CHF and AICD in place. Also with hypertension and hyperlipidemia. He was referred to Dr. Shelah for evaluation of lung nodule 09/2021.   Synopsis 73 y.o. 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 Also with hypertension and hyperlipidemia. He was referred to Dr. Shelah for evaluation of lung nodule 09/2021.  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 Shannon, last seen by Dr. Shannon in April 2023, presented to the ED on 11/03/2023 with complaints of double vision and dizziness and falls for 1 week prior to arrival.  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 He recently completed stereotactic radiosurgery 11/19/2023  per Dr. Joshua to intracranial metastatic lesions and tolerated the treatments well. PET imaging done 11/29/2023 to restage showed a new irregular 11 x 9 mm subpleural nodule the lateral left upper lobe (series 7/image 20), max SUV 3.8, suspicious for recurrence/metastasis versus new primary bronchogenic carcinoma.Pt. has been referred back for for biopsy of the new pulmonary nodules and tissue diagnosis.    12/17/2023 Pt. 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 He has undergone treatment for the brain mets. Oncology , who is following the patient, has requested biopsy of new lung nodule for tissue diagnosis.. Patient  states he has been doing well. He has not had any breathing issues. He reported no recent fever or breathing issues but noted an increased appetite. He is currently on decadron   post stereotactic radiosurgery , which is most likely the reason for the increased appetite.  The patient has no known allergies, no history of sleep apnea or diabetes, and is not currently on blood thinners. He previously underwent a bronchoscopy without any complications in 2022. Plan is for follow up bronchoscopy with Dr. Shelah 12/26/2022. Pt. Is followed at the [facility], but has been cleared for care with ecologist.I have messaged Dr. Shelah to see if he need additional imaging for the procedure.   I have reviewed all risks with the patient. He is in agreement with moving forward with the biopsy. He understands he must have someone available to drive him to the hospital the morning of the procedure, stay during the procedure, drive him home after the procedure and stay with him for 24 hours after the procedure.   Test Results: PET scan 11/29/2023 11 mm irregular subpleural nodule in the lateral left upper lobe, suspicious for recurrence/metastasis versus primary bronchogenic carcinoma.   4 mm subpleural nodule in the right upper lobe is non FDG avid, although beneath the size threshold for PET sensitivity.   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  Cytology 10/13/2021 A. LUNG, RLL, BRUSHING:  -  Malignant cells present consistent with non-small cell carcinoma  -  See comment   B. LUNG, RLL, NEEDLE:  -  Malignant cells present consistent with non-small cell carcinoma  -  See comment   COMMENT:   By immunohistochemistry, the neoplastic cells are positive for  cytokeratin 5/6 and p40 with weak expression of TTF-1.  Overall,  the  morphology and immunophenotype favors a squamous cell carcinoma.  Dr.  Belvie reviewed the case and agrees with the above diagnosis.        Latest Ref Rng & Units 12/03/2023   10:52 AM 11/13/2023    4:38 AM 11/12/2023   10:50 AM  CBC  WBC 4.0 - 10.5 K/uL 15.3  5.4    Hemoglobin 13.0 - 17.0 g/dL  86.3  87.8  87.0   Hematocrit 39.0 - 52.0 % 38.8  36.3  38.0   Platelets 150 - 400 K/uL 117  191         Latest Ref Rng & Units 12/03/2023   10:52 AM 11/15/2023    7:58 AM 11/13/2023    4:38 AM  BMP  Glucose 70 - 99 mg/dL 645  822  799   BUN 8 - 23 mg/dL 28  18  18    Creatinine 0.61 - 1.24 mg/dL 8.84  8.92  8.95   Sodium 135 - 145 mmol/L 135  138  140   Potassium 3.5 - 5.1 mmol/L 4.6  4.2  4.1   Chloride 98 - 111 mmol/L 101  107  114   CO2 22 - 32 mmol/L 28  22  22    Calcium  8.9 - 10.3 mg/dL 9.1  9.3  9.0     BNP    Component Value Date/Time   BNP 286.2 (H) 03/29/2018 1620    ProBNP    Component Value Date/Time   PROBNP 444 (H) 11/19/2022 0920   PROBNP 62.8 04/07/2012 2114    PFT    Component Value Date/Time   FEV1PRE 1.94 08/26/2021 1457   FEV1POST 1.71 08/26/2021 1457   FVCPRE 2.82 08/26/2021 1457   FVCPOST 3.08 08/26/2021 1457   TLC 5.82 08/26/2021 1457   DLCOUNC 11.88 08/26/2021 1457   PREFEV1FVCRT 69 08/26/2021 1457   PSTFEV1FVCRT 55 08/26/2021 1457    NM PET Image Restag (PS) Skull Base To Thigh Result Date: 12/05/2023 CLINICAL DATA:  Subsequent treatment strategy for recurrent lung cancer. EXAM: NUCLEAR MEDICINE PET SKULL BASE TO THIGH TECHNIQUE: 7.7 mCi F-18 FDG was injected intravenously. Full-ring PET imaging was performed from the skull base to thigh after the radiotracer. CT data was obtained and used for attenuation correction and anatomic localization. Fasting blood glucose: 229 mg/dl COMPARISON:  MRI abdomen dated 11/15/2023. CT chest abdomen pelvis dated 11/13/2023. PET-CT dated 09/02/2021. FINDINGS: Mediastinal blood pool activity: SUV max 3.1 Liver activity: SUV max NA NECK: No hypermetabolic lymph nodes in the neck. Incidental CT findings: None. CHEST: 11 x 9 mm irregular subpleural nodule the lateral left upper lobe (series 7/image 20), max SUV 3.8, suspicious for recurrence/metastasis versus primary bronchogenic carcinoma. 4 mm subpleural nodule in  the posterior right upper lobe (series 7/image 22), non FDG avid although beneath the size threshold for PET sensitivity. Additional ovoid/linear nodular opacity in the posterior right lower lobe (series 7/image 35), with nearby fiducial marker (series 7/image 37), non FDG avid and possibly reflecting post treatment changes. No hypermetabolic thoracic lymphadenopathy. Right chest port terminates at the cavoatrial junction. Incidental CT findings: Moderate centrilobular and paraseptal emphysematous changes, upper lung predominant. Atherosclerotic calcifications of the aortic arch. Moderate coronary atherosclerosis of the LAD and left circumflex. Cardiomegaly. Left subclavian ICD. 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 No abnormal hypermetabolic activity within the pancreas, adrenal glands, or spleen. No hypermetabolic lymph nodes in the abdomen or pelvis. Incidental CT findings: Calcified  splenic granulomata. Atherosclerotic calcifications of the abdominal aorta and branch vessels. Postsurgical changes related to prior bilateral inguinal hernia repair. SKELETON: No focal hypermetabolic activity to suggest skeletal metastasis. Incidental CT findings: Degenerative changes of the visualized thoracolumbar spine. IMPRESSION: 11 mm irregular subpleural nodule in the lateral left upper lobe, suspicious for recurrence/metastasis versus primary bronchogenic carcinoma. 4 mm subpleural nodule in the right upper lobe is non FDG avid, although beneath the size threshold for PET sensitivity. 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 Additional ancillary findings as above. Electronically Signed   By: Pinkie Pebbles M.D.   On: 12/05/2023 17:49     Past medical hx Past Medical History:  Diagnosis Date   Acute renal insufficiency    Anginal pain (HCC)    Chronic lower back pain    due to 3 ruptured  discs in my back (11/08/2018)   High cholesterol    History of kidney stones 1982   passed them   History of radiation therapy    Right lung- 11/03/21-11/14/21- Dr. Lynwood Nay   Hypertension    Lung cancer [facility])    Myocardial infarction (HCC) 03/2018   Systolic heart failure (HCC)    Tobacco use      Social History   Tobacco Use   Smoking status: Former    Current packs/day: 1.50    Average packs/day: 1.5 packs/day for 47.0 years (70.5 ttl pk-yrs)    Types: Cigarettes   Smokeless tobacco: Former   Tobacco comments:    1 cigarette smoked daily 05/28/22 [facility]   Vaping Use   Vaping status: Never Used  Substance Use Topics   Alcohol use: Not Currently    Comment: 11/08/2018 stopped 09/23/1998   Drug use: Not Currently    Comment: 11/08/2018 nothing in the 2000s    Mr.Bugay reports that he has quit smoking. His smoking use included cigarettes. He has a 70.5 pack-year smoking history. He has quit using smokeless tobacco. He reports that he does not currently use alcohol. He reports that he does not currently use drugs.  Tobacco Cessation: Counseling given: Not Answered Tobacco comments: 1 cigarette smoked daily 05/28/22 [facility]  Former smoker  Past surgical hx, Family hx, Social hx all reviewed.  Current Outpatient Medications on File Prior to Visit  Medication Sig   aspirin  81 MG EC tablet Take 1 tablet (81 mg total) by mouth daily.   atorvastatin  (LIPITOR ) 40 MG tablet Take 1 tablet (40 mg total) by mouth at bedtime.   carvedilol  (COREG ) 25 MG tablet Take 12.5 mg by mouth daily.   chlorproMAZINE  (THORAZINE ) 25 MG tablet Take 1 tablet (25 mg total) by mouth 3 (three) times daily as needed (Hiccups).   dexamethasone  (DECADRON ) 4 MG tablet Take 1 tablet (4 mg total) by mouth every 8 (eight) hours.   dexamethasone  (DECADRON ) 4 MG tablet Take 1 tab 2 times daily starting day before pemetrexed. Then take 2 tabs daily x 3 days starting day after carboplatin. Take with food.    furosemide  (LASIX ) 20 MG tablet Take 1 tablet (20 mg total) by mouth daily. As directed   lidocaine -prilocaine  (EMLA ) cream Apply to affected area once   metFORMIN  (GLUCOPHAGE ) 500 MG tablet Take 1 tablet (500 mg total) by mouth 2 (two) times daily with a meal.   metoCLOPramide  (REGLAN ) 5 MG tablet Take 1 tablet (5 mg total) by mouth every 8 (eight) hours as needed for nausea.   Multiple Vitamin (MULTIVITAMIN  WITH MINERALS) TABS tablet Take 1 tablet by mouth daily.   nicotine  (NICODERM CQ  - DOSED IN MG/24 HOURS) 21 mg/24hr patch Place 21 mg onto the skin daily. Last patch was applied 3-4 days ago, no patch is on today 11/12/2023.   ondansetron  (ZOFRAN ) 8 MG tablet Take 1 tablet (8 mg total) by mouth every 8 (eight) hours as needed for nausea or vomiting. Start on the third day after carboplatin.   prochlorperazine  (COMPAZINE ) 10 MG tablet Take 1 tablet (10 mg total) by mouth every 6 (six) hours as needed for nausea or vomiting.   spironolactone  (ALDACTONE ) 25 MG tablet Take 0.5 tablets (12.5 mg total) by mouth daily.   No current facility-administered medications on file prior to visit.     No Known Allergies  Review Of Systems:  Constitutional:   +  weight loss, no night sweats,  Fevers, chills, fatigue, or  lassitude.  HEENT:   No headaches,  Difficulty swallowing,  Tooth/dental problems, or  Sore throat,                No sneezing, itching, ear ache, nasal congestion, post nasal drip,   CV:  No chest pain,  Orthopnea, PND, swelling in lower extremities, anasarca, dizziness, palpitations, syncope.   GI  No heartburn, indigestion, abdominal pain, nausea, vomiting, diarrhea, change in bowel habits, loss of appetite, bloody stools.   Resp: No shortness of breath with exertion or at rest.  No excess mucus, no productive cough,  No non-productive cough,  No coughing up of blood.  No change in color of mucus.  No wheezing.  No chest wall deformity  Skin: no rash or lesions.  GU: no dysuria,  change in color of urine, no urgency or frequency.  No flank pain, no hematuria   MS:  No joint pain or swelling.  No decreased range of motion.  No back pain.  Psych:  No change in mood or affect. No depression or anxiety.  No memory loss.   Vital Signs BP 124/60 (BP Location: Right Arm, Cuff Size: Large)   Pulse 71   Ht 5' 6 (1.676 m)   Wt 145 lb (65.8 kg)   SpO2 97%   BMI 23.40 kg/m    Physical Exam:  General- No distress,  A&Ox3, pleasant ENT: No sinus tenderness, TM clear, pale nasal mucosa, no oral exudate,no post nasal drip, no LAN Cardiac: S1, S2, regular rate and rhythm, no murmur Chest: No wheeze/ rales/ dullness; no accessory muscle use, no nasal flaring, no sternal retractions Abd.: Soft Non-tender Ext: No clubbing cyanosis, edema Neuro:  normal strength, MAE x 4, A&O x 3, appropriate Skin: No rashes, warm and dry, no obvious lesions  Psych: normal mood and behavior   Assessment/Plan Malignant Neoplasm of the lower lobe of the right lung, previously treated with IMRT for total of  60 cGray in 5 fractions for stage I A right lung squamous cell carcinoma in 2022.  Metastatic Brain Cancer SP stereotactic radiosurgery 11/19/2023  per Dr. Joshua  Suspicion for Recurrent LUL cancer, as new mass noted on PET imaging Small lung nodule RUL Former smoker Plan We will schedule you for a bronchoscopy with biopsies to determine if the new left lung mass is cancer . We are tentatively looking at 12/27/2023 as the date. You will get a letter today with additional information about the procedure.  We have discussed the risks to include bleeding, infection,lung puncture and adverse anesthetic events. These are rare but we have  to let you know.  You will need someone with you to bring you to the procedure, stay with you while it is being done, and drive you home. They will also need to stay with you for 24 hours after the procedure. You will get a call if we need an additional  Chest for procedure planning. You will follow up with me 1 week after the procedure to review the biopsy results, and make sure you are doing well.  Call if you need us  sooner. Please contact office for sooner follow up if symptoms do not improve or worsen or seek emergency care    I spent 25 minutes dedicated to the care of this patient on the date of this encounter to include pre-visit review of records, face-to-face time with the patient discussing conditions above, post visit ordering of testing, clinical documentation with the electronic health record, making appropriate referrals as documented, and communicating necessary information to the patient's healthcare team.    Lauraine JULIANNA Lites, NP 12/17/2023  1:30 PM

## 2023-12-17 NOTE — H&P (View-Only) (Signed)
 History of Present Illness ALEKS NAWROT is a 73 y.o. male former smoker with additional PMH of CAD with systolic CHF and AICD in place. Also with hypertension and hyperlipidemia. He was referred to Dr. Shelah for evaluation of lung nodule 09/2021.   Synopsis 73 y.o. 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 Also with hypertension and hyperlipidemia. He was referred to Dr. Shelah for evaluation of lung nodule 09/2021.  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 Shannon, last seen by Dr. Shannon in April 2023, presented to the ED on 11/03/2023 with complaints of double vision and dizziness and falls for 1 week prior to arrival.  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 He recently completed stereotactic radiosurgery 11/19/2023  per Dr. Joshua to intracranial metastatic lesions and tolerated the treatments well. PET imaging done 11/29/2023 to restage showed a new irregular 11 x 9 mm subpleural nodule the lateral left upper lobe (series 7/image 20), max SUV 3.8, suspicious for recurrence/metastasis versus new primary bronchogenic carcinoma.Pt. has been referred back for for biopsy of the new pulmonary nodules and tissue diagnosis.    12/17/2023 Pt. 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 He has undergone treatment for the brain mets. Oncology , who is following the patient, has requested biopsy of new lung nodule for tissue diagnosis.. Patient  states he has been doing well. He has not had any breathing issues. He reported no recent fever or breathing issues but noted an increased appetite. He is currently on decadron   post stereotactic radiosurgery , which is most likely the reason for the increased appetite.  The patient has no known allergies, no history of sleep apnea or diabetes, and is not currently on blood thinners. He previously underwent a bronchoscopy without any complications in 2022. Plan is for follow up bronchoscopy with Dr. Shelah 12/26/2022. Pt. Is followed at the [facility], but has been cleared for care with ecologist.I have messaged Dr. Shelah to see if he need additional imaging for the procedure.   I have reviewed all risks with the patient. He is in agreement with moving forward with the biopsy. He understands he must have someone available to drive him to the hospital the morning of the procedure, stay during the procedure, drive him home after the procedure and stay with him for 24 hours after the procedure.   Test Results: PET scan 11/29/2023 11 mm irregular subpleural nodule in the lateral left upper lobe, suspicious for recurrence/metastasis versus primary bronchogenic carcinoma.   4 mm subpleural nodule in the right upper lobe is non FDG avid, although beneath the size threshold for PET sensitivity.   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  Cytology 10/13/2021 A. LUNG, RLL, BRUSHING:  -  Malignant cells present consistent with non-small cell carcinoma  -  See comment   B. LUNG, RLL, NEEDLE:  -  Malignant cells present consistent with non-small cell carcinoma  -  See comment   COMMENT:   By immunohistochemistry, the neoplastic cells are positive for  cytokeratin 5/6 and p40 with weak expression of TTF-1.  Overall,  the  morphology and immunophenotype favors a squamous cell carcinoma.  Dr.  Belvie reviewed the case and agrees with the above diagnosis.        Latest Ref Rng & Units 12/03/2023   10:52 AM 11/13/2023    4:38 AM 11/12/2023   10:50 AM  CBC  WBC 4.0 - 10.5 K/uL 15.3  5.4    Hemoglobin 13.0 - 17.0 g/dL  86.3  87.8  87.0   Hematocrit 39.0 - 52.0 % 38.8  36.3  38.0   Platelets 150 - 400 K/uL 117  191         Latest Ref Rng & Units 12/03/2023   10:52 AM 11/15/2023    7:58 AM 11/13/2023    4:38 AM  BMP  Glucose 70 - 99 mg/dL 645  822  799   BUN 8 - 23 mg/dL 28  18  18    Creatinine 0.61 - 1.24 mg/dL 8.84  8.92  8.95   Sodium 135 - 145 mmol/L 135  138  140   Potassium 3.5 - 5.1 mmol/L 4.6  4.2  4.1   Chloride 98 - 111 mmol/L 101  107  114   CO2 22 - 32 mmol/L 28  22  22    Calcium  8.9 - 10.3 mg/dL 9.1  9.3  9.0     BNP    Component Value Date/Time   BNP 286.2 (H) 03/29/2018 1620    ProBNP    Component Value Date/Time   PROBNP 444 (H) 11/19/2022 0920   PROBNP 62.8 04/07/2012 2114    PFT    Component Value Date/Time   FEV1PRE 1.94 08/26/2021 1457   FEV1POST 1.71 08/26/2021 1457   FVCPRE 2.82 08/26/2021 1457   FVCPOST 3.08 08/26/2021 1457   TLC 5.82 08/26/2021 1457   DLCOUNC 11.88 08/26/2021 1457   PREFEV1FVCRT 69 08/26/2021 1457   PSTFEV1FVCRT 55 08/26/2021 1457    NM PET Image Restag (PS) Skull Base To Thigh Result Date: 12/05/2023 CLINICAL DATA:  Subsequent treatment strategy for recurrent lung cancer. EXAM: NUCLEAR MEDICINE PET SKULL BASE TO THIGH TECHNIQUE: 7.7 mCi F-18 FDG was injected intravenously. Full-ring PET imaging was performed from the skull base to thigh after the radiotracer. CT data was obtained and used for attenuation correction and anatomic localization. Fasting blood glucose: 229 mg/dl COMPARISON:  MRI abdomen dated 11/15/2023. CT chest abdomen pelvis dated 11/13/2023. PET-CT dated 09/02/2021. FINDINGS: Mediastinal blood pool activity: SUV max 3.1 Liver activity: SUV max NA NECK: No hypermetabolic lymph nodes in the neck. Incidental CT findings: None. CHEST: 11 x 9 mm irregular subpleural nodule the lateral left upper lobe (series 7/image 20), max SUV 3.8, suspicious for recurrence/metastasis versus primary bronchogenic carcinoma. 4 mm subpleural nodule in  the posterior right upper lobe (series 7/image 22), non FDG avid although beneath the size threshold for PET sensitivity. Additional ovoid/linear nodular opacity in the posterior right lower lobe (series 7/image 35), with nearby fiducial marker (series 7/image 37), non FDG avid and possibly reflecting post treatment changes. No hypermetabolic thoracic lymphadenopathy. Right chest port terminates at the cavoatrial junction. Incidental CT findings: Moderate centrilobular and paraseptal emphysematous changes, upper lung predominant. Atherosclerotic calcifications of the aortic arch. Moderate coronary atherosclerosis of the LAD and left circumflex. Cardiomegaly. Left subclavian ICD. 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 No abnormal hypermetabolic activity within the pancreas, adrenal glands, or spleen. No hypermetabolic lymph nodes in the abdomen or pelvis. Incidental CT findings: Calcified  splenic granulomata. Atherosclerotic calcifications of the abdominal aorta and branch vessels. Postsurgical changes related to prior bilateral inguinal hernia repair. SKELETON: No focal hypermetabolic activity to suggest skeletal metastasis. Incidental CT findings: Degenerative changes of the visualized thoracolumbar spine. IMPRESSION: 11 mm irregular subpleural nodule in the lateral left upper lobe, suspicious for recurrence/metastasis versus primary bronchogenic carcinoma. 4 mm subpleural nodule in the right upper lobe is non FDG avid, although beneath the size threshold for PET sensitivity. 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 Additional ancillary findings as above. Electronically Signed   By: Pinkie Pebbles M.D.   On: 12/05/2023 17:49     Past medical hx Past Medical History:  Diagnosis Date   Acute renal insufficiency    Anginal pain (HCC)    Chronic lower back pain    due to 3 ruptured  discs in my back (11/08/2018)   High cholesterol    History of kidney stones 1982   passed them   History of radiation therapy    Right lung- 11/03/21-11/14/21- Dr. Lynwood Nay   Hypertension    Lung cancer [facility])    Myocardial infarction (HCC) 03/2018   Systolic heart failure (HCC)    Tobacco use      Social History   Tobacco Use   Smoking status: Former    Current packs/day: 1.50    Average packs/day: 1.5 packs/day for 47.0 years (70.5 ttl pk-yrs)    Types: Cigarettes   Smokeless tobacco: Former   Tobacco comments:    1 cigarette smoked daily 05/28/22 [facility]   Vaping Use   Vaping status: Never Used  Substance Use Topics   Alcohol use: Not Currently    Comment: 11/08/2018 stopped 09/23/1998   Drug use: Not Currently    Comment: 11/08/2018 nothing in the 2000s    Mr.Bugay reports that he has quit smoking. His smoking use included cigarettes. He has a 70.5 pack-year smoking history. He has quit using smokeless tobacco. He reports that he does not currently use alcohol. He reports that he does not currently use drugs.  Tobacco Cessation: Counseling given: Not Answered Tobacco comments: 1 cigarette smoked daily 05/28/22 [facility]  Former smoker  Past surgical hx, Family hx, Social hx all reviewed.  Current Outpatient Medications on File Prior to Visit  Medication Sig   aspirin  81 MG EC tablet Take 1 tablet (81 mg total) by mouth daily.   atorvastatin  (LIPITOR ) 40 MG tablet Take 1 tablet (40 mg total) by mouth at bedtime.   carvedilol  (COREG ) 25 MG tablet Take 12.5 mg by mouth daily.   chlorproMAZINE  (THORAZINE ) 25 MG tablet Take 1 tablet (25 mg total) by mouth 3 (three) times daily as needed (Hiccups).   dexamethasone  (DECADRON ) 4 MG tablet Take 1 tablet (4 mg total) by mouth every 8 (eight) hours.   dexamethasone  (DECADRON ) 4 MG tablet Take 1 tab 2 times daily starting day before pemetrexed. Then take 2 tabs daily x 3 days starting day after carboplatin. Take with food.    furosemide  (LASIX ) 20 MG tablet Take 1 tablet (20 mg total) by mouth daily. As directed   lidocaine -prilocaine  (EMLA ) cream Apply to affected area once   metFORMIN  (GLUCOPHAGE ) 500 MG tablet Take 1 tablet (500 mg total) by mouth 2 (two) times daily with a meal.   metoCLOPramide  (REGLAN ) 5 MG tablet Take 1 tablet (5 mg total) by mouth every 8 (eight) hours as needed for nausea.   Multiple Vitamin (MULTIVITAMIN  WITH MINERALS) TABS tablet Take 1 tablet by mouth daily.   nicotine  (NICODERM CQ  - DOSED IN MG/24 HOURS) 21 mg/24hr patch Place 21 mg onto the skin daily. Last patch was applied 3-4 days ago, no patch is on today 11/12/2023.   ondansetron  (ZOFRAN ) 8 MG tablet Take 1 tablet (8 mg total) by mouth every 8 (eight) hours as needed for nausea or vomiting. Start on the third day after carboplatin.   prochlorperazine  (COMPAZINE ) 10 MG tablet Take 1 tablet (10 mg total) by mouth every 6 (six) hours as needed for nausea or vomiting.   spironolactone  (ALDACTONE ) 25 MG tablet Take 0.5 tablets (12.5 mg total) by mouth daily.   No current facility-administered medications on file prior to visit.     No Known Allergies  Review Of Systems:  Constitutional:   +  weight loss, no night sweats,  Fevers, chills, fatigue, or  lassitude.  HEENT:   No headaches,  Difficulty swallowing,  Tooth/dental problems, or  Sore throat,                No sneezing, itching, ear ache, nasal congestion, post nasal drip,   CV:  No chest pain,  Orthopnea, PND, swelling in lower extremities, anasarca, dizziness, palpitations, syncope.   GI  No heartburn, indigestion, abdominal pain, nausea, vomiting, diarrhea, change in bowel habits, loss of appetite, bloody stools.   Resp: No shortness of breath with exertion or at rest.  No excess mucus, no productive cough,  No non-productive cough,  No coughing up of blood.  No change in color of mucus.  No wheezing.  No chest wall deformity  Skin: no rash or lesions.  GU: no dysuria,  change in color of urine, no urgency or frequency.  No flank pain, no hematuria   MS:  No joint pain or swelling.  No decreased range of motion.  No back pain.  Psych:  No change in mood or affect. No depression or anxiety.  No memory loss.   Vital Signs BP 124/60 (BP Location: Right Arm, Cuff Size: Large)   Pulse 71   Ht 5' 6 (1.676 m)   Wt 145 lb (65.8 kg)   SpO2 97%   BMI 23.40 kg/m    Physical Exam:  General- No distress,  A&Ox3, pleasant ENT: No sinus tenderness, TM clear, pale nasal mucosa, no oral exudate,no post nasal drip, no LAN Cardiac: S1, S2, regular rate and rhythm, no murmur Chest: No wheeze/ rales/ dullness; no accessory muscle use, no nasal flaring, no sternal retractions Abd.: Soft Non-tender Ext: No clubbing cyanosis, edema Neuro:  normal strength, MAE x 4, A&O x 3, appropriate Skin: No rashes, warm and dry, no obvious lesions  Psych: normal mood and behavior   Assessment/Plan Malignant Neoplasm of the lower lobe of the right lung, previously treated with IMRT for total of  60 cGray in 5 fractions for stage I A right lung squamous cell carcinoma in 2022.  Metastatic Brain Cancer SP stereotactic radiosurgery 11/19/2023  per Dr. Joshua  Suspicion for Recurrent LUL cancer, as new mass noted on PET imaging Small lung nodule RUL Former smoker Plan We will schedule you for a bronchoscopy with biopsies to determine if the new left lung mass is cancer . We are tentatively looking at 12/27/2023 as the date. You will get a letter today with additional information about the procedure.  We have discussed the risks to include bleeding, infection,lung puncture and adverse anesthetic events. These are rare but we have  to let you know.  You will need someone with you to bring you to the procedure, stay with you while it is being done, and drive you home. They will also need to stay with you for 24 hours after the procedure. You will get a call if we need an additional  Chest for procedure planning. You will follow up with me 1 week after the procedure to review the biopsy results, and make sure you are doing well.  Call if you need us  sooner. Please contact office for sooner follow up if symptoms do not improve or worsen or seek emergency care    I spent 25 minutes dedicated to the care of this patient on the date of this encounter to include pre-visit review of records, face-to-face time with the patient discussing conditions above, post visit ordering of testing, clinical documentation with the electronic health record, making appropriate referrals as documented, and communicating necessary information to the patient's healthcare team.    Lauraine JULIANNA Lites, NP 12/17/2023  1:30 PM

## 2023-12-21 ENCOUNTER — Encounter: Payer: Self-pay | Admitting: Oncology

## 2023-12-21 DIAGNOSIS — C3431 Malignant neoplasm of lower lobe, right bronchus or lung: Secondary | ICD-10-CM | POA: Diagnosis not present

## 2023-12-22 LAB — GUARDANT 360

## 2023-12-23 ENCOUNTER — Encounter: Payer: Self-pay | Admitting: Cardiovascular Disease

## 2023-12-23 ENCOUNTER — Encounter (HOSPITAL_COMMUNITY): Payer: Self-pay | Admitting: Emergency Medicine

## 2023-12-23 NOTE — Progress Notes (Signed)
 SDW call  Patient was given pre-op instructions over the phone. Patient verbalized understanding of instructions provided.  Patient states last dose of Decadron  was 12/22/2023     PCP - [facility] EP Cardiologist: Dr. Lynwood Rakers Pulmonary:    PPM/ICD - ICD, Medtronic Device Orders - requested Rep Notified -    Chest x-ray - na EKG -  11/17/2023 Stress Test - ECHO -  Cardiac Cath - 04/01/2018  Sleep Study/sleep apnea/CPAP: denies  Non-diabetic  Blood Thinner Instructions: denies Aspirin  Instructions:states last  dose 11/14/2023   ERAS Protcol - NPO   Anesthesia review: Yes.  HTN, MI, angina, ICD, CHF, high cholesterol   Patient denies shortness of breath, fever, cough and chest pain over the phone call  Your procedure is scheduled on Monday December 27, 2023  Report to [facility] Main Entrance A at  1100  A.M., then check in with the Admitting office.  Call this number if you have problems the morning of surgery:  [phone]   If you have any questions prior to your surgery date call [phone]: Open Monday-Friday 8am-4pm If you experience any cold or flu symptoms such as cough, fever, chills, shortness of breath, etc. between now and your scheduled surgery, please notify us  at the above number    Remember:  Do not eat or drink after midnight the night before your surgery  Take these medicines the morning of surgery with A SIP OF WATER:  Carvedilol   As needed: Thorazine , reglan , zofran , compazine   As of today, STOP taking any Aspirin  (unless otherwise instructed by your surgeon) Aleve, Naproxen, Ibuprofen , Motrin , Advil , Goody's, BC's, all herbal medications, fish oil, and all vitamins.

## 2023-12-23 NOTE — Progress Notes (Signed)
 PERIOPERATIVE PRESCRIPTION FOR IMPLANTED CARDIAC DEVICE PROGRAMMING  Patient Information: Name:  Juan Norris  DOB:  1951-02-03  MRN:  [id number]  Planned Procedure:  Robotic assisted bronchoscopy with biopsies  Surgeon:  Dr. Lamar Chris  Date of Procedure:  12/27/2023  Cautery will be used.  Position during surgery:  Supine  Device Information:  Clinic EP Physician:  Dr. Eulas Furbish  Device Type:  Defibrillator Manufacturer and Phone #:  Medtronic: [id number] Pacemaker Dependent?:  No. Date of Last Device Check:  11/10/2023 Normal Device Function?:  Yes.    Electrophysiologist's Recommendations:  Have magnet available. Provide continuous ECG monitoring when magnet is used or reprogramming is to be performed.  Procedure may interfere with device function.  Magnet should be placed over device during procedure.  Per Device Clinic Standing Orders, [name], RN  1:21 PM 12/23/2023

## 2023-12-24 NOTE — Progress Notes (Signed)
 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 Recently admitted 11/29-12/3/24 after presenting with diplopia and dizziness. 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 Neurosurgery consulted and recommended no surgical intervention, and recommended oncology discussion. MRI demonstrated liver nodules which were favored to benign etiology while brain masses suspected to be metastasis, and showed additional mass effect. Neurosurgery consulted for mass effect seen on MRI. Per Radiation Oncology, they recommended immediate treatment. Patient received stereotactic radiosurgery CT simulation[facility] at [facility] before discharge. IR placed Port-A-Cath prior to discharge. Palliative Care and TOC were consulted for outpatient follow up and transportation assistance, respectively.  He recently completed stereotactic radiosurgery 11/19/2023  per Dr. Joshua to intracranial metastatic lesions and tolerated the treatments well. PET imaging done 11/29/2023 to restage showed a new irregular 11 x 9 mm subpleural nodule the lateral left upper lobe (series 7/image 20), max SUV 3.8, suspicious for recurrence/metastasis versus new primary bronchogenic carcinoma.   Pt seen by pulmonology 12/17/23. Per note, Patient  states he has been doing well. He has not had any breathing issues. He reported no recent fever or breathing issues but noted an increased appetite. He is currently on decadron  post stereotactic radiosurgery , which is most likely the reason for the increased appetite.  The patient has no known allergies, no history of sleep apnea or diabetes, and is not currently on blood thinners. He previously  underwent a bronchoscopy without any complications in 2022.  Pt was last seen by EP Cardiologist Dr. Nancey 06/04/23. Noted to be stable at that time, normal device function, 99% BiV paced, not device dependent. Continued on GDMT for HFrEF.  Will need DOS labs and eval.  EKG 11/12/23: Sinus rhythm. Rate 63. LBBB.  CT C/A/P 11/13/23: IMPRESSION: 1. New irregular peripheral left upper lobe pulmonary nodule measuring 1.3 x 0.8 cm and new subpleural right upper lobe 5 x 2 mm nodule, suspicious for pulmonary neoplasm, either primary or metastatic. 2. Continued decrease in size of treated subpleural medial right lower lobe pulmonary nodule measuring 1.1 x 0.6 cm, previously 1.4 x 1.0 cm. 3. Hepatic segment 4 enhancing lesion measures 2.0 x 1.8 cm, suspicious for metastatic disease. 4. Aortic Atherosclerosis (ICD10-I70.0) and Emphysema (ICD10-J43.9). Coronary artery calcifications. Assessment for potential risk factor modification, dietary therapy or pharmacologic therapy may be warranted, if clinically indicated.  TTE 09/23/21: 1. Left ventricular ejection fraction, by estimation, is 25 to 30%. The  left ventricle has severely decreased function. The left ventricle  demonstrates global hypokinesis. Left ventricular diastolic parameters are  consistent with Grade I diastolic  dysfunction (impaired relaxation).   2. Right ventricular systolic function is normal. The right ventricular  size is mildly enlarged. There is normal pulmonary artery systolic  pressure. The estimated right ventricular systolic pressure is 28.2 mmHg.   3. Left atrial size was mildly dilated.   4. The mitral valve is grossly normal. Mild mitral valve regurgitation.  No evidence of mitral stenosis.   5. The aortic valve is tricuspid. There is mild calcification of the  aortic valve. There is mild thickening of the aortic valve. Aortic valve  regurgitation is moderate. Mild aortic valve sclerosis is present, with no  evidence of aortic valve stenosis.   6. The inferior vena cava is normal in size with greater than 50%  respiratory variability, suggesting right atrial pressure of 3 mmHg.   Comparison(s): No significant change from prior study. EF remains severely  reduced ~25%. AI is moderate.   Cath 03/30/18: Lida 2nd Mrg lesion is 40% stenosed. Prox Cx to Mid Cx lesion is 20% stenosed. Prox LAD lesion is 10% stenosed.   1. Mild non-obstructive CAD. His presentation could be due to coronary vasospasm.  2. Normal LV filling pressure.    Recommendations: Medical management of mild CAD. Consider addition of a long acting nitrate or calcium  channel blocker for possible spasm. Echo is planned today to assess LV function.    Lynwood Geofm RIGGERS [facility] Short Stay Center/Anesthesiology Phone [phone] 12/24/2023 11:02 AM

## 2023-12-24 NOTE — Anesthesia Preprocedure Evaluation (Addendum)
 Anesthesia Evaluation  Patient identified by MRN, date of birth, ID band Patient awake    Reviewed: Allergy & Precautions, NPO status , Patient's Chart, lab work & pertinent test results  Airway Mallampati: II  TM Distance: >3 FB Neck ROM: Full    Dental  (+) Missing, Chipped, Loose, Poor Dentition,    Pulmonary COPD, former smoker   Pulmonary exam normal breath sounds clear to auscultation       Cardiovascular hypertension, Pt. on home beta blockers and Pt. on medications + CAD, + Past MI and +CHF  Normal cardiovascular exam+ Cardiac Defibrillator (BiVICD)  Rhythm:Regular Rate:Normal  ECHO: Left ventricular ejection fraction, by estimation, is 25 to 30%. The left ventricle has severely decreased function. The left ventricle demonstrates global hypokinesis. Left ventricular diastolic parameters are consistent with Grade I diastolic dysfunction (impaired relaxation). Right ventricular systolic function is normal. The right ventricular size is mildly enlarged. There is normal pulmonary artery systolic pressure. The estimated right ventricular systolic pressure is 28.2 mmHg. 2. 3. Left atrial size was mildly dilated. The mitral valve is grossly normal. Mild mitral valve regurgitation. No evidence of mitral stenosis. The aortic valve is tricuspid. There is mild calcification of the aortic valve. There is mild thickening of the aortic valve. Aortic valve regurgitation is moderate. Mild aortic valve sclerosis is present, with no evidence of aortic valve stenosis. The inferior vena cava is normal in size with greater than 50% respiratory variability, suggesting right atrial pressure of 3 mmHg.   Neuro/Psych negative neurological ROS  negative psych ROS   GI/Hepatic negative GI ROS, Neg liver ROS,,,  Endo/Other  diabetes, Poorly Controlled, Type 2    Renal/GU negative Renal ROS     Musculoskeletal negative musculoskeletal  ROS (+)    Abdominal   Peds  Hematology HLD   Anesthesia Other Findings LUNG NODULE  Reproductive/Obstetrics                             Anesthesia Physical Anesthesia Plan  ASA: 3  Anesthesia Plan: General   Post-op Pain Management: Minimal or no pain anticipated   Induction: Intravenous  PONV Risk Score and Plan: 2 and Ondansetron , Treatment may vary due to age or medical condition and Midazolam   Airway Management Planned: Oral ETT  Additional Equipment:   Intra-op Plan:   Post-operative Plan: Extubation in OR  Informed Consent: I have reviewed the patients History and Physical, chart, labs and discussed the procedure including the risks, benefits and alternatives for the proposed anesthesia with the patient or authorized representative who has indicated his/her understanding and acceptance.     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 Recently admitted 11/29-12/3/24 after presenting with diplopia and dizziness. 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 Neurosurgery consulted and recommended no surgical intervention, and recommended oncology discussion. MRI demonstrated liver nodules which were favored to benign etiology while brain masses suspected to be metastasis, and showed additional mass effect. Neurosurgery consulted for mass effect seen on MRI. Per Radiation Oncology, they recommended immediate treatment. Patient received stereotactic  radiosurgery CT simulation/therapy at [facility] before discharge. IR placed Port-A-Cath prior to discharge. Palliative  Care and TOC were consulted for outpatient follow up and transportation assistance, respectively.  He recently completed stereotactic radiosurgery 11/19/2023  per Dr. Joshua to intracranial metastatic lesions and tolerated the treatments well. PET imaging done 11/29/2023 to restage showed a new irregular 11 x 9 mm subpleural nodule the lateral left upper lobe (series 7/image 20), max SUV 3.8, suspicious for recurrence/metastasis versus new primary bronchogenic carcinoma.   Pt seen by pulmonology 12/17/23. Per note, Patient  states he has been doing well. He has not had any breathing issues. He reported no recent fever or breathing issues but noted an increased appetite. He is currently on decadron  post stereotactic radiosurgery , which is most likely the reason for the increased appetite.  The patient has no known allergies, no history of sleep apnea or diabetes, and is not currently on blood thinners. He previously underwent a bronchoscopy without any complications in 2022.  Pt was last seen by EP Cardiologist Dr. Nancey 06/04/23. Noted to be stable at that time, normal device function, 99% BiV paced, not device dependent. Continued on GDMT for HFrEF.  Will need DOS labs and eval.  EKG 11/12/23: Sinus rhythm. Rate 63. LBBB.  CT C/A/P 11/13/23: IMPRESSION: 1. New irregular peripheral left upper lobe pulmonary nodule measuring 1.3 x 0.8 cm and new subpleural right upper lobe 5 x 2 mm nodule, suspicious for pulmonary neoplasm, either primary or metastatic. 2. Continued decrease in size of treated subpleural medial right lower lobe pulmonary nodule measuring 1.1 x 0.6 cm, previously 1.4 x 1.0 cm. 3. Hepatic segment 4 enhancing lesion measures 2.0 x 1.8 cm, suspicious for metastatic disease. 4. Aortic Atherosclerosis (ICD10-I70.0) and Emphysema (ICD10-J43.9). Coronary artery calcifications. Assessment for potential risk factor modification, dietary therapy or pharmacologic therapy may  be warranted, if clinically indicated.  TTE 09/23/21: 1. Left ventricular ejection fraction, by estimation, is 25 to 30%. The  left ventricle has severely decreased function. The left ventricle  demonstrates global hypokinesis. Left ventricular diastolic parameters are  consistent with Grade I diastolic  dysfunction (impaired relaxation).  2. Right ventricular systolic function is normal. The right ventricular  size is mildly enlarged. There is normal pulmonary artery systolic  pressure. The estimated right ventricular systolic pressure is 28.2 mmHg.  3. Left atrial size was mildly dilated.  4. The mitral valve is grossly normal. Mild mitral valve regurgitation.  No evidence of mitral stenosis.  5. The aortic valve is tricuspid. There is mild calcification of the  aortic valve. There is mild thickening of the aortic valve. Aortic valve  regurgitation is moderate. Mild aortic valve sclerosis is present, with no  evidence of aortic valve stenosis.  6. The inferior vena cava is normal in size with greater than 50%  respiratory variability, suggesting right atrial pressure of 3 mmHg.   Comparison(s): No significant change from prior study. EF remains severely  reduced ~25%. AI is moderate.   Cath 03/30/18:  Lida 2nd Mrg lesion is 40% stenosed.  Prox Cx to Mid Cx lesion is 20% stenosed.  Prox LAD lesion is 10% stenosed.   1. Mild non-obstructive CAD. His presentation could be due to coronary vasospasm.  2. Normal LV filling pressure.   Recommendations: Medical management of mild CAD. Consider addition of a long acting nitrate or calcium  channel blocker for possible spasm. Echo is planned today to assess LV function.   )  Anesthesia Quick Evaluation

## 2023-12-27 ENCOUNTER — Ambulatory Visit (HOSPITAL_COMMUNITY)
Admission: RE | Admit: 2023-12-27 | Discharge: 2023-12-27 | Disposition: A | Discharge status: Home / Self Care | Payer: No Typology Code available for payment source | Attending: Emergency Medicine | Admitting: Emergency Medicine

## 2023-12-27 ENCOUNTER — Encounter (HOSPITAL_COMMUNITY): Payer: Self-pay | Admitting: Emergency Medicine

## 2023-12-27 ENCOUNTER — Ambulatory Visit (INDEPENDENT_AMBULATORY_CARE_PROVIDER_SITE_OTHER): Payer: No Typology Code available for payment source

## 2023-12-27 ENCOUNTER — Ambulatory Visit (HOSPITAL_BASED_OUTPATIENT_CLINIC_OR_DEPARTMENT_OTHER): Payer: No Typology Code available for payment source | Admitting: Physician Assistant

## 2023-12-27 ENCOUNTER — Encounter (HOSPITAL_COMMUNITY)
Admission: RE | Disposition: A | Discharge status: Home / Self Care | Payer: Self-pay | Source: Home / Self Care | Attending: Emergency Medicine

## 2023-12-27 ENCOUNTER — Ambulatory Visit (HOSPITAL_COMMUNITY): Payer: No Typology Code available for payment source

## 2023-12-27 ENCOUNTER — Ambulatory Visit (HOSPITAL_COMMUNITY): Payer: No Typology Code available for payment source | Admitting: Physician Assistant

## 2023-12-27 DIAGNOSIS — I10 Essential (primary) hypertension: Secondary | ICD-10-CM | POA: Diagnosis not present

## 2023-12-27 DIAGNOSIS — C3412 Malignant neoplasm of upper lobe, left bronchus or lung: Secondary | ICD-10-CM | POA: Diagnosis present

## 2023-12-27 DIAGNOSIS — I252 Old myocardial infarction: Secondary | ICD-10-CM | POA: Diagnosis not present

## 2023-12-27 DIAGNOSIS — I110 Hypertensive heart disease with heart failure: Secondary | ICD-10-CM | POA: Insufficient documentation

## 2023-12-27 DIAGNOSIS — Z87891 Personal history of nicotine dependence: Secondary | ICD-10-CM

## 2023-12-27 DIAGNOSIS — J449 Chronic obstructive pulmonary disease, unspecified: Secondary | ICD-10-CM | POA: Diagnosis not present

## 2023-12-27 DIAGNOSIS — Z95810 Presence of automatic (implantable) cardiac defibrillator: Secondary | ICD-10-CM | POA: Diagnosis not present

## 2023-12-27 DIAGNOSIS — R911 Solitary pulmonary nodule: Secondary | ICD-10-CM

## 2023-12-27 DIAGNOSIS — I2510 Atherosclerotic heart disease of native coronary artery without angina pectoris: Secondary | ICD-10-CM

## 2023-12-27 DIAGNOSIS — Z7984 Long term (current) use of oral hypoglycemic drugs: Secondary | ICD-10-CM | POA: Insufficient documentation

## 2023-12-27 DIAGNOSIS — E1165 Type 2 diabetes mellitus with hyperglycemia: Secondary | ICD-10-CM | POA: Insufficient documentation

## 2023-12-27 DIAGNOSIS — Z7952 Long term (current) use of systemic steroids: Secondary | ICD-10-CM | POA: Insufficient documentation

## 2023-12-27 DIAGNOSIS — C7931 Secondary malignant neoplasm of brain: Secondary | ICD-10-CM | POA: Insufficient documentation

## 2023-12-27 DIAGNOSIS — I5042 Chronic combined systolic (congestive) and diastolic (congestive) heart failure: Secondary | ICD-10-CM | POA: Diagnosis not present

## 2023-12-27 DIAGNOSIS — I5022 Chronic systolic (congestive) heart failure: Secondary | ICD-10-CM | POA: Diagnosis not present

## 2023-12-27 DIAGNOSIS — Z923 Personal history of irradiation: Secondary | ICD-10-CM | POA: Insufficient documentation

## 2023-12-27 HISTORY — PX: BRONCHIAL BIOPSY: SHX5109

## 2023-12-27 HISTORY — PX: BRONCHIAL BRUSHINGS: SHX5108

## 2023-12-27 HISTORY — DX: Heart failure, unspecified: I50.9

## 2023-12-27 HISTORY — PX: FIDUCIAL MARKER PLACEMENT: SHX6858

## 2023-12-27 HISTORY — PX: BRONCHIAL NEEDLE ASPIRATION BIOPSY: SHX5106

## 2023-12-27 HISTORY — DX: Presence of automatic (implantable) cardiac defibrillator: Z95.810

## 2023-12-27 LAB — POCT I-STAT, CHEM 8
BUN: 28 mg/dL — ABNORMAL HIGH (ref 8–23)
Calcium, Ion: 1.23 mmol/L (ref 1.15–1.40)
Chloride: 105 mmol/L (ref 98–111)
Creatinine, Ser: 1.1 mg/dL (ref 0.61–1.24)
Glucose, Bld: 365 mg/dL — ABNORMAL HIGH (ref 70–99)
HCT: 42 % (ref 39.0–52.0)
Hemoglobin: 14.3 g/dL (ref 13.0–17.0)
Potassium: 5.5 mmol/L — ABNORMAL HIGH (ref 3.5–5.1)
Sodium: 139 mmol/L (ref 135–145)
TCO2: 23 mmol/L (ref 22–32)

## 2023-12-27 LAB — GLUCOSE, CAPILLARY
Glucose-Capillary: 328 mg/dL — ABNORMAL HIGH (ref 70–99)
Glucose-Capillary: 286 mg/dL — ABNORMAL HIGH (ref 70–99)
Glucose-Capillary: 231 mg/dL — ABNORMAL HIGH (ref 70–99)

## 2023-12-27 SURGERY — BRONCHOSCOPY, WITH BIOPSY USING ELECTROMAGNETIC NAVIGATION
Anesthesia: General

## 2023-12-27 MED ORDER — ROCURONIUM BROMIDE 10 MG/ML (PF) SYRINGE
PREFILLED_SYRINGE | INTRAVENOUS | Status: DC | PRN
  Administered 2023-12-27: 40 mg via INTRAVENOUS

## 2023-12-27 MED ORDER — INSULIN ASPART 100 UNIT/ML IJ SOLN
0.0000 [IU] | INTRAMUSCULAR | Status: AC | PRN
  Administered 2023-12-27: 5 [IU] via SUBCUTANEOUS
  Filled 2023-12-27 (×2): qty 0.07
  Filled 2023-12-27: qty 1

## 2023-12-27 MED ORDER — SUCCINYLCHOLINE CHLORIDE 200 MG/10ML IV SOSY
PREFILLED_SYRINGE | INTRAVENOUS | Status: DC | PRN
  Administered 2023-12-27: 120 mg via INTRAVENOUS

## 2023-12-27 MED ORDER — LIDOCAINE HCL (CARDIAC) PF 100 MG/5ML IV SOSY
PREFILLED_SYRINGE | INTRAVENOUS | Status: DC | PRN
  Administered 2023-12-27: 60 mg via INTRATRACHEAL

## 2023-12-27 MED ORDER — HYDROMORPHONE HCL 1 MG/ML IJ SOLN: 0.2500 mg | INTRAMUSCULAR | Status: DC | PRN

## 2023-12-27 MED ORDER — CHLORHEXIDINE GLUCONATE 0.12 % MT SOLN
OROMUCOSAL | Status: AC
  Filled 2023-12-27: qty 15

## 2023-12-27 MED ORDER — SUGAMMADEX SODIUM 200 MG/2ML IV SOLN
INTRAVENOUS | Status: DC | PRN
  Administered 2023-12-27: 200 mg via INTRAVENOUS

## 2023-12-27 MED ORDER — SODIUM CHLORIDE 0.9 % IV SOLN
12.5000 mg | INTRAVENOUS | Status: DC | PRN
  Filled 2023-12-27: qty 0.5

## 2023-12-27 MED ORDER — OXYCODONE HCL 5 MG PO TABS: 5.0000 mg | ORAL_TABLET | Freq: Once | ORAL | Status: DC | PRN

## 2023-12-27 MED ORDER — FENTANYL CITRATE (PF) 100 MCG/2ML IJ SOLN
INTRAMUSCULAR | Status: AC
  Filled 2023-12-27: qty 2

## 2023-12-27 MED ORDER — PHENYLEPHRINE HCL-NACL 20-0.9 MG/250ML-% IV SOLN
INTRAVENOUS | Status: DC | PRN
  Administered 2023-12-27: 25 ug/min via INTRAVENOUS

## 2023-12-27 MED ORDER — ONDANSETRON HCL 4 MG/2ML IJ SOLN
INTRAMUSCULAR | Status: DC | PRN
  Administered 2023-12-27: 4 mg via INTRAVENOUS

## 2023-12-27 MED ORDER — INSULIN ASPART 100 UNIT/ML IJ SOLN
INTRAMUSCULAR | Status: AC
  Administered 2023-12-27: 4 [IU] via SUBCUTANEOUS
  Filled 2023-12-27: qty 1

## 2023-12-27 MED ORDER — FENTANYL CITRATE (PF) 100 MCG/2ML IJ SOLN
INTRAMUSCULAR | Status: DC | PRN
  Administered 2023-12-27 (×2): 50 ug via INTRAVENOUS

## 2023-12-27 MED ORDER — PROPOFOL 10 MG/ML IV BOLUS
INTRAVENOUS | Status: DC | PRN
  Administered 2023-12-27: 100 mg via INTRAVENOUS

## 2023-12-27 MED ORDER — SODIUM CHLORIDE 0.9 % IV SOLN: INTRAVENOUS | Status: DC | PRN

## 2023-12-27 MED ORDER — AMISULPRIDE (ANTIEMETIC) 5 MG/2ML IV SOLN: 10.0000 mg | Freq: Once | INTRAVENOUS | Status: DC | PRN

## 2023-12-27 MED ORDER — OXYCODONE HCL 5 MG/5ML PO SOLN: 5.0000 mg | Freq: Once | ORAL | Status: DC | PRN

## 2023-12-27 MED ORDER — CHLORHEXIDINE GLUCONATE 0.12 % MT SOLN: 15.0000 mL | Freq: Once | OROMUCOSAL | Status: DC

## 2023-12-27 SURGICAL SUPPLY — 1 items: superlock fiducial IMPLANT

## 2023-12-27 NOTE — Anesthesia Procedure Notes (Signed)
 Procedure Name: Intubation Date/Time: 12/27/2023 2:23 PM  Performed by: Cindie Donald CROME, CRNAPre-anesthesia Checklist: Patient identified, Emergency Drugs available, Suction available and Patient being monitored Patient Re-evaluated:Patient Re-evaluated prior to induction Oxygen Delivery Method: Circle System Utilized Preoxygenation: Pre-oxygenation with 100% oxygen Induction Type: IV induction Ventilation: Mask ventilation without difficulty Laryngoscope Size: Mac and 4 Grade View: Grade I Tube type: Oral Tube size: 8.5 mm Number of attempts: 1 Airway Equipment and Method: Stylet Placement Confirmation: ETT inserted through vocal cords under direct vision, positive ETCO2 and breath sounds checked- equal and bilateral Secured at: 22 cm Tube secured with: Tape Dental Injury: Teeth and Oropharynx as per pre-operative assessment

## 2023-12-27 NOTE — Interval H&P Note (Signed)
 History and Physical Interval Note:  12/27/2023 2:06 PM  Juan Norris  has presented today for surgery, with the diagnosis of SUSPECTED REOCCURANCE LUNG CANCER.  The various methods of treatment have been discussed with the patient and family. After consideration of risks, benefits and other options for treatment, the patient has consented to  Procedure(s): ROBOTIC ASSISTED NAVIGATIONAL BRONCHOSCOPY (N/A) as a surgical intervention.  [name] history has been reviewed, patient examined, no change in status, stable for surgery.  I have reviewed the patient's chart and labs.  Questions were answered to the patient's satisfaction.     [name]

## 2023-12-27 NOTE — Op Note (Signed)
 Video Bronchoscopy with Robotic Assisted Bronchoscopic Navigation   Date of Operation: 12/27/2023   Pre-op Diagnosis: Left upper lobe nodule  Post-op Diagnosis: Same  Surgeon: Lamar Chris  Assistants: None  Anesthesia: General endotracheal anesthesia  Operation: Flexible video fiberoptic bronchoscopy with robotic assistance and biopsies.  Estimated Blood Loss: Minimal  Complications: None  Indications and History: Juan Norris is a 74 y.o. male with history of remote non-small cell lung cancer.  Now with a newly diagnosed brain mass and a new left upper lobe pulmonary nodule suspicious for malignancy.  Recommendation made to achieve tissue diagnosis via robotic assisted navigational bronchoscopy with biopsies.  The risks, benefits, complications, treatment options and expected outcomes were discussed with the patient.  The possibilities of pneumothorax, pneumonia, reaction to medication, pulmonary aspiration, perforation of a viscus, bleeding, failure to diagnose a condition and creating a complication requiring transfusion or operation were discussed with the patient who freely signed the consent.    Description of Procedure: The patient was seen in the Preoperative Area, was examined and was deemed appropriate to proceed.  The patient was taken to [facility] endoscopy room 3, identified as Juan Norris and the procedure verified as Flexible Video Fiberoptic Bronchoscopy.  A Time Out was held and the above information confirmed.   Prior to the date of the procedure a high-resolution CT scan of the chest was performed. Utilizing ION software program a virtual tracheobronchial tree was generated to allow the creation of distinct navigation pathways to the patient's parenchymal abnormalities. After being taken to the operating room general anesthesia was initiated and the patient  was orally intubated. The video fiberoptic bronchoscope was introduced via the endotracheal tube and a general inspection  was performed which showed normal right and left lung anatomy. Aspiration of the bilateral mainstems was completed to remove any remaining secretions. Robotic catheter inserted into patient's endotracheal tube.   Target #1 left upper lobe pulmonary nodule: The distinct navigation pathways prepared prior to this procedure were then utilized to navigate to patient's lesion identified on CT scan. The robotic catheter was secured into place and the vision probe was withdrawn.  Lesion location was approximated using fluoroscopy.  Local registration and targeting was performed using Cios three-dimensional imaging. Under fluoroscopic guidance transbronchial brushings, transbronchial needle biopsies, and transbronchial forceps biopsies were performed to be sent for cytology and pathology.  Under fluoroscopic guidance a single fiducial marker was placed adjacent to the nodule.   At the end of the procedure a general airway inspection was performed and there was no evidence of active bleeding. The bronchoscope was removed.  The patient tolerated the procedure well. There was no significant blood loss and there were no obvious complications. A post-procedural chest x-ray is pending.  Samples Target #1: 1. Transbronchial brushings from left upper lobe pulmonary nodule 2. Transbronchial Wang needle biopsies from left upper lobe pulmonary nodule 3. Transbronchial forceps biopsies from left upper lobe pulmonary nodule   Plans:  The patient will be discharged from the PACU to home when recovered from anesthesia and after chest x-ray is reviewed. We will review the cytology, pathology and microbiology results with the patient when they become available. Outpatient followup will be with CANDIE Lites, NP.   Lamar Chris, MD, PhD 12/27/2023, 3:12 PM [facility] [phone] or if no answer before 7:00PM call [phone] For any issues after 7:00PM please call eLink [phone]

## 2023-12-27 NOTE — Anesthesia Postprocedure Evaluation (Signed)
 Anesthesia Post Note  Patient: DAWN KIPER  Procedure(s) Performed: ROBOTIC ASSISTED NAVIGATIONAL BRONCHOSCOPY BRONCHIAL BIOPSIES BRONCHIAL BRUSHINGS BRONCHIAL NEEDLE ASPIRATION BIOPSIES FIDUCIAL MARKER PLACEMENT     Patient location during evaluation: PACU Anesthesia Type: General Level of consciousness: awake and alert Pain management: pain level controlled Vital Signs Assessment: post-procedure vital signs reviewed and stable Respiratory status: spontaneous breathing, nonlabored ventilation and respiratory function stable Cardiovascular status: blood pressure returned to baseline and stable Postop Assessment: no apparent nausea or vomiting Anesthetic complications: no   No notable events documented.  Last Vitals:  Vitals:   12/27/23 1530 12/27/23 1545  BP: (!) 121/45 (!) 123/58  Pulse: 70 75  Resp: 14 17  Temp:  36.4 C  SpO2: 98% 96%    Last Pain:  Vitals:   12/27/23 1545  TempSrc:   PainSc: 0-No pain                 Butler Levander Pinal

## 2023-12-27 NOTE — Transfer of Care (Signed)
 Immediate Anesthesia Transfer of Care Note  Patient: ALTA SHOBER  Procedure(s) Performed: ROBOTIC ASSISTED NAVIGATIONAL BRONCHOSCOPY BRONCHIAL BIOPSIES BRONCHIAL BRUSHINGS BRONCHIAL NEEDLE ASPIRATION BIOPSIES FIDUCIAL MARKER PLACEMENT  Patient Location: PACU  Anesthesia Type:General  Level of Consciousness: drowsy and patient cooperative  Airway & Oxygen Therapy: Patient Spontanous Breathing and Patient connected to nasal cannula oxygen  Post-op Assessment: Report given to RN and Post -op Vital signs reviewed and stable  Post vital signs: Reviewed and stable  Last Vitals:  Vitals Value Taken Time  BP 133/48 12/27/23 1515  Temp    Pulse 66 12/27/23 1517  Resp 11 12/27/23 1517  SpO2 96 % 12/27/23 1517  Vitals shown include unfiled device data.  Last Pain:  Vitals:   12/27/23 1128  TempSrc:   PainSc: 0-No pain         Complications: No notable events documented.

## 2023-12-27 NOTE — Discharge Instructions (Addendum)
 Flexible Bronchoscopy, Care After This sheet gives you information about how to care for yourself after your test. Your doctor may also give you more specific instructions. If you have problems or questions, contact your doctor. Follow these instructions at home: Eating and drinking When your numbness is gone and your cough and gag reflexes have come back, you may: Eat only soft foods. Slowly drink liquids. When you get home after the test, go back to your normal diet. Driving Do not drive for 24 hours if you were given a medicine to help you relax (sedative). Do not drive or use heavy machinery while taking prescription pain medicine. General instructions  Take over-the-counter and prescription medicines only as told by your doctor. Return to your normal activities as told. Ask what activities are safe for you. Do not use any products that have nicotine or tobacco in them. This includes cigarettes and e-cigarettes. If you need help quitting, ask your doctor. Keep all follow-up visits as told by your doctor. This is important. It is very important if you had a tissue sample (biopsy) taken. Get help right away if: You have shortness of breath that gets worse. You get light-headed. You feel like you are going to pass out (faint). You have chest pain. You cough up: More than a little blood. More blood than before. Summary Do not eat or drink anything (not even water) for 2 hours after your test, or until your numbing medicine wears off. Do not use cigarettes. Do not use e-cigarettes. Get help right away if you have chest pain.  Please call our office for any questions or concerns.  [phone].  This information is not intended to replace advice given to you by your health care provider. Make sure you discuss any questions you have with your health care provider. Document Released: 09/27/2009 Document Revised: 11/12/2017 Document Reviewed: 12/18/2016 Elsevier Patient Education  2020  [employer].

## 2023-12-28 ENCOUNTER — Ambulatory Visit
Admission: RE | Admit: 2023-12-28 | Discharge: 2023-12-28 | Disposition: A | Discharge status: Home / Self Care | Payer: Medicare PPO | Source: Ambulatory Visit | Attending: Radiation Oncology | Admitting: Radiation Oncology

## 2023-12-28 NOTE — Progress Notes (Signed)
  Radiation Oncology         [phone] ________________________________  Name: Juan Norris MRN: [id number]  Date of Service: 12/28/2023  DOB: [birth year]  Post Treatment Telephone Note  Diagnosis:  C79.31 Secondary malignant neoplasm of brain (as documented in provider EOT note)  The patient was available for call today.  The patient did not note fatigue during radiation. The patient did not note hair loss or skin changes in the field of radiation during therapy. The patient is not taking dexamethasone . The patient does not have symptoms of  weakness or loss of control of the extremities. The patient does not have symptoms of headache. The patient does not have symptoms of seizure or uncontrolled movement. The patient does not have symptoms of changes in vision. The patient does not have changes in speech. The patient does not have confusion.   The patient was counseled that he  will be contacted by our brain and spine navigator to schedule surveillance imaging. The patient was encouraged to call if he  have not received a call to schedule imaging, or if he  develops concerns or questions regarding radiation.  This concludes the interaction.  Rosaline Minerva, LPN

## 2023-12-30 ENCOUNTER — Encounter (HOSPITAL_COMMUNITY): Payer: Self-pay | Admitting: Emergency Medicine

## 2023-12-30 LAB — CYTOLOGY - NON PAP

## 2023-12-31 ENCOUNTER — Telehealth: Payer: Self-pay | Admitting: Emergency Medicine

## 2023-12-31 NOTE — Telephone Encounter (Signed)
I reviewed the bronchoscopy results with the patient by phone.  He has a history of what was presumed to be stage I squamous cell lung cancer, now with apparent brain metastases. We biopsied a new evolving left upper lobe pulmonary nodule and the pathology is consistent with adenocarcinoma.  He has follow-up with oncology at the [facility] and also follow-up with S. Groce in our office.  We will work to coordinate his care.

## 2023-12-31 NOTE — Progress Notes (Signed)
I reached out to [facility] Aileen Pilot to let her know that Dr Kathrynn Running would like to treat the LUL with SBRT and is currently awaiting [facility] approval. Sue Lush let me know that the pt is going to start systemic treatment next week and will let [facility] know. She will look into using the referral for the pts brain radiation will also cover the SBRT to his lung.

## 2023-12-31 NOTE — Progress Notes (Signed)
EPIC Encounter for ICM Monitoring  Patient Name: Juan Norris is a 73 y.o. male Date: 12/31/2023 Primary Care Physican: [facility] Primary Cardiologist: Shari Prows Electrophysiologist: Mealor Bi-V Pacing:   99.5%       02/22/2023 Weight: 151 lbs 06/11/2023 Weight: 150-151 lbs 08/20/2023 Weight: 150 lbs 12/30/2022 Weight: 138 lbs                                                           Spoke with patient and heart failure questions reviewed.  Transmission results reviewed.  Pt asymptomatic for fluid accumulation.  He is receiving treatment for lung cancer.    Diet: 12/31/2023 He does not limit fluid or salt.  Appetite is good despite lung cancer treatments.    Optivol Thoracic impedance continues suggesting normal fluid levels within the last month.   Prescribed:  Furosemide 20 mg take 1 tablet by mouth daily as directed  Potassium 10 mEq take 1 tablet by mouth as directed when taking Lasix Spironolactone 25 mg take 0.5 tablet (12.5 mg total) daily   Labs: 11/19/2022 Creatinine 1.16, BUN 18, Potassium 5.4, Sodium 142, GFR 67 A complete set of results can be found in Results Review.   Recommendations:   No changes and encouraged to call if experiencing any fluid symptoms.   Follow-up plan: ICM clinic phone appointment on 01/31/2024.   91 day device clinic remote transmission 2/26/20254.      EP/Cardiology Office Visits:  Recall 05/29/2024 with Dr Nelly Laurence.  Aware to call Dr Devin Going office to schedule over due appt.  Due 03/2023 appt with Dr Shari Prows (4 month f/u).   Copy of ICM check sent to Dr. Nelly Laurence.   3 month ICM trend: 12/27/2023.    12-14 Month ICM trend:     Karie Soda, RN 12/31/2023 1:19 PM

## 2024-01-04 ENCOUNTER — Ambulatory Visit: Payer: Medicare PPO | Admitting: Acute Care

## 2024-01-04 DIAGNOSIS — R739 Hyperglycemia, unspecified: Secondary | ICD-10-CM | POA: Diagnosis not present

## 2024-01-04 DIAGNOSIS — Z79899 Other long term (current) drug therapy: Secondary | ICD-10-CM | POA: Diagnosis not present

## 2024-01-04 DIAGNOSIS — T380X5A Adverse effect of glucocorticoids and synthetic analogues, initial encounter: Secondary | ICD-10-CM | POA: Diagnosis not present

## 2024-01-05 ENCOUNTER — Other Ambulatory Visit: Payer: Self-pay | Admitting: Radiation Therapy

## 2024-01-05 ENCOUNTER — Ambulatory Visit: Payer: Medicare PPO | Admitting: Acute Care

## 2024-01-05 DIAGNOSIS — C7931 Secondary malignant neoplasm of brain: Secondary | ICD-10-CM

## 2024-01-07 ENCOUNTER — Ambulatory Visit
Admission: RE | Admit: 2024-01-07 | Discharge: 2024-01-07 | Disposition: A | Discharge status: Home / Self Care | Source: Ambulatory Visit | Attending: Urology | Admitting: Urology

## 2024-01-07 ENCOUNTER — Encounter: Payer: Self-pay | Admitting: Cardiology

## 2024-01-07 ENCOUNTER — Encounter: Payer: Self-pay | Admitting: Urology

## 2024-01-07 ENCOUNTER — Ambulatory Visit
Admission: RE | Admit: 2024-01-07 | Discharge: 2024-01-07 | Disposition: A | Discharge status: Home / Self Care | Payer: No Typology Code available for payment source | Source: Ambulatory Visit | Attending: Radiation Oncology | Admitting: Radiation Oncology

## 2024-01-07 VITALS — BP 130/50 | HR 79 | Temp 97.1°F | Resp 18 | Ht 66.0 in | Wt 153.1 lb

## 2024-01-07 DIAGNOSIS — C3412 Malignant neoplasm of upper lobe, left bronchus or lung: Secondary | ICD-10-CM

## 2024-01-07 DIAGNOSIS — Z923 Personal history of irradiation: Secondary | ICD-10-CM | POA: Diagnosis not present

## 2024-01-07 DIAGNOSIS — Z87442 Personal history of urinary calculi: Secondary | ICD-10-CM | POA: Diagnosis not present

## 2024-01-07 DIAGNOSIS — C7931 Secondary malignant neoplasm of brain: Secondary | ICD-10-CM | POA: Diagnosis not present

## 2024-01-07 DIAGNOSIS — C3432 Malignant neoplasm of lower lobe, left bronchus or lung: Secondary | ICD-10-CM | POA: Diagnosis present

## 2024-01-07 DIAGNOSIS — Z510 Encounter for antineoplastic radiation therapy: Secondary | ICD-10-CM | POA: Diagnosis not present

## 2024-01-07 DIAGNOSIS — Z7984 Long term (current) use of oral hypoglycemic drugs: Secondary | ICD-10-CM | POA: Diagnosis not present

## 2024-01-07 DIAGNOSIS — Z87891 Personal history of nicotine dependence: Secondary | ICD-10-CM | POA: Insufficient documentation

## 2024-01-07 DIAGNOSIS — I252 Old myocardial infarction: Secondary | ICD-10-CM | POA: Diagnosis not present

## 2024-01-07 DIAGNOSIS — Z7982 Long term (current) use of aspirin: Secondary | ICD-10-CM | POA: Insufficient documentation

## 2024-01-07 DIAGNOSIS — R911 Solitary pulmonary nodule: Secondary | ICD-10-CM

## 2024-01-07 DIAGNOSIS — I110 Hypertensive heart disease with heart failure: Secondary | ICD-10-CM | POA: Insufficient documentation

## 2024-01-07 DIAGNOSIS — C3431 Malignant neoplasm of lower lobe, right bronchus or lung: Secondary | ICD-10-CM

## 2024-01-07 DIAGNOSIS — Z7952 Long term (current) use of systemic steroids: Secondary | ICD-10-CM | POA: Diagnosis not present

## 2024-01-07 DIAGNOSIS — E7800 Pure hypercholesterolemia, unspecified: Secondary | ICD-10-CM | POA: Diagnosis not present

## 2024-01-07 DIAGNOSIS — Z79899 Other long term (current) drug therapy: Secondary | ICD-10-CM | POA: Insufficient documentation

## 2024-01-07 DIAGNOSIS — I700 Atherosclerosis of aorta: Secondary | ICD-10-CM | POA: Insufficient documentation

## 2024-01-07 DIAGNOSIS — G8929 Other chronic pain: Secondary | ICD-10-CM | POA: Insufficient documentation

## 2024-01-07 NOTE — Progress Notes (Signed)
TO BE COMPLETED BY RADIATION ONCOLOGIST OFFICE:   Patient Name: Juan Norris   Date of Birth: [birth year]   Radiation Oncologist: Dr. Margaretmary Dys  Site to be Treated: Left lung upper lobe   Will x-rays >10 MV be used? No  Will the radiation be >10 cm from the device? Yes  Planned Treatment Start Date: ASAP  TO BE COMPLETED BY CARDIOLOGIST OFFICE:   Device Information:  Pacemaker []      ICD [x]    Brand: Medtronic: [phone] Model #: Medtronic [id number] Claria MRI CRT-D  Serial Number: [id number]     Date of Placement: 11/08/2018  Site of Placement: Left Chest  Remote Device Check--Frequency: Every 91 days   Last Check: 12/27/2023  Is the Patient Pacer Dependent?: Unknown  Does cardiologist request Radiation Oncology to schedule device testing by vendor for the following:  Prior to the Initiation of Treatments?  Yes []  No [x]  During Treatments?  Yes []  No [x]  Post Radiation Treatments?  Yes [x]  No []   Is device monitoring necessary by vendor/cardiologist team during treatments?  Yes []   No [x]   Is cardiac monitoring by Radiation Oncology nursing necessary during treatments? Yes [x]   No []   Do you recommend device be relocated prior to Radiation Treatment? Yes []   No [x]   **PLEASE LIST ANY NOTES OR SPECIAL REQUESTS:       CARDIOLOGIST SIGNATURE:  Dr. Loman Brooklyn Per Device Clinic Standing Orders, [name]  01/07/2024 11:50 AM  **Please route completed form back to Radiation Oncology Nursing and "P [facility] RAD ONC ADMIN", OR send an update if there will be a delay in having form completed by expected start date.  **Call [phone] if you have any questions or do not get an in-basket response from a Radiation Oncology staff member

## 2024-01-07 NOTE — Progress Notes (Addendum)
 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 Nodule of upper lobe of left lung  R91.1     2. Malignant neoplasm of bronchus of right lower lobe (HCC)  C34.31     3. Primary cancer of left upper lobe of lung (HCC)  C34.12       HISTORY OF PRESENT ILLNESS: Juan Norris is a 73 y.o. male seen at the request of Dr. Mathis Bud at the [facility]. The patient is well-known to our service, previously treated for metastatic lung cancer to the brain. 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 Sharol Roussel from 11/03/21 through 11/14/21.  Follow-up CT chest imaging had subsequently remained stable without evidence of progression or recurrence through April 2023 when he last saw Dr. Roselind Messier.  The plan at that time was for a follow-up scan in 6 months but he was lost to follow-up at the cancer center there after but apparently was being followed with serial CT chest scans with Dr. Delton See, in medical oncology at the [facility].  Reportedly, his scans had remained without evidence of disease recurrence or progression as of his last scan in August 2024.  Recently, he presented to the emergency department on 11/12/2023 with complaints of diplopia and dizziness for 2 days.  A CT angio head and neck was performed on admission and showed masses in the left occipital lobe and right posterior fossa with significant edema and mass effect with effacement of the  fourth ventricle.  Also noted was new nodularity and pleural thickening in the left upper lobe lung.  He was started on dexamethasone 4 mg p.o. 3 times daily and has had resolution of the diplopia and dizziness since starting the steroids.  A CT C/A/P was performed on 11/13/2023 and showed a stable, smaller appearance of the treated RLL nodule but a new 1.3 cm LUL nodule and a 5 mm RUL nodule.  Additionally, a 2 cm enhancing liver lesion was noted.  A CT head showed a 2.6 cm peripherally enhancing mass in the left occipital cortex and a 3.5 cm enhancing mass in the inferior right cerebellum.  MRI abdomen was performed on 11/15/2023 for further evaluation of the liver lesion and suggests this lesion is incidental, benign focal nodular hyperplasia rather than a metastasis.    An MRI brain scan on 11/15/2023 confirmed peripherally enhancing, partially cystic masses in the left occipital lobe and right cerebellum, consistent with metastatic disease. The right cerebellar mass was causing associated mass effect on the fourth ventricle, right pons, and right medulla. No hydrocephalus. Dr. Yetta Barre, of neurosurgery, was consulted and recommended fractionated stereotactic radiosurgery as opposed to surgical resection. He subsequently completed 3 fractions of SRS to the brain under the care of Dr. Kathrynn Running on 11/19/23, 11/22/23, and 11/24/23.   Since we last saw the patient, he underwent restaging PET scan on 11/29/23  showing an 11 mm subpleural nodule in the lateral LUL, suspicious for recurrence/metastasis vs primary bronchogenic carcinoma as well as a 4 mm subpleural nodule in RUL, beneath size threshold for PET sensitivity. The hepatic lesions were non-FDG-avid, favoring suspected benign etiology. He was referred back to pulmonology and proceeded with bronchoscopy with biopsies on 12/27/23 under the care of Dr. Delton Coombes. Cytology from both samples taken from the LUL lung nodule confirmed NSCLC, adenocarcinoma.  He has been kindly  referred back to [place] today to discuss the radiation treatment options for management of his new, Stage 1A2, NSCLC in the LUL lung.  PREVIOUS RADIATION THERAPY: Yes   11/19/23, 11/22/23, 11/24/23: Brain SRS / 27 Gy in 3 fractions  11/03/2021 through 11/14/2021 Site Technique Total Dose (Gy) Dose per Fx (Gy) Completed Fx Beam Energies  Lung, Right: Lung_Rt IMRT 60/60 12 5/5 6XFFF     PAST MEDICAL HISTORY:  Past Medical History:  Diagnosis Date   Acute renal insufficiency    AICD (automatic cardioverter/defibrillator) present    Anginal pain (HCC)    CHF (congestive heart failure) (HCC)    Chronic lower back pain    "due to 3 ruptured discs in my back" (11/08/2018)   High cholesterol    High cholesterol    History of kidney stones 1982   "passed them"   History of radiation therapy    Right lung- 11/03/21-11/14/21- Dr. Joylene Draft   Hypertension    Lung cancer [facility])    Myocardial infarction (HCC) 03/2018   Systolic heart failure (HCC)    Tobacco use       PAST SURGICAL HISTORY: Past Surgical History:  Procedure Laterality Date   BIV ICD INSERTION CRT-D N/A 11/08/2018    Medtronic biventricular ICD implantation by Dr Johney Frame with His Bundle pacing and a MDT 3830 lead placed into the LV port.    BRONCHIAL BIOPSY  10/13/2021   Procedure: BRONCHIAL BIOPSIES;  Surgeon: Leslye Peer, MD;  Location: [facility] ENDOSCOPY;  Service: Pulmonary;;   BRONCHIAL BIOPSY  12/27/2023   Procedure: BRONCHIAL BIOPSIES;  Surgeon: Leslye Peer, MD;  Location: [facility] ENDOSCOPY;  Service: Pulmonary;;   BRONCHIAL BRUSHINGS  10/13/2021   Procedure: BRONCHIAL BRUSHINGS;  Surgeon: Leslye Peer, MD;  Location: [facility] ENDOSCOPY;  Service: Pulmonary;;   BRONCHIAL BRUSHINGS  12/27/2023   Procedure: BRONCHIAL BRUSHINGS;  Surgeon: Leslye Peer, MD;  Location: [facility] ENDOSCOPY;  Service: Pulmonary;;   BRONCHIAL NEEDLE ASPIRATION BIOPSY  10/13/2021   Procedure: BRONCHIAL NEEDLE ASPIRATION BIOPSIES;  Surgeon: Leslye Peer, MD;  Location: [facility] ENDOSCOPY;  Service: Pulmonary;;   BRONCHIAL NEEDLE ASPIRATION BIOPSY  12/27/2023   Procedure: BRONCHIAL NEEDLE ASPIRATION BIOPSIES;  Surgeon: Leslye Peer, MD;  Location: [facility] ENDOSCOPY;  Service: Pulmonary;;   BRONCHIAL WASHINGS  10/13/2021   Procedure: BRONCHIAL WASHINGS;  Surgeon: Leslye Peer, MD;  Location: [facility] ENDOSCOPY;  Service: Pulmonary;;   CARDIAC CATHETERIZATION     FIDUCIAL MARKER PLACEMENT  10/13/2021   Procedure: FIDUCIAL MARKER PLACEMENT;  Surgeon: Leslye Peer, MD;  Location: [facility] ENDOSCOPY;  Service: Pulmonary;;   FIDUCIAL MARKER PLACEMENT  12/27/2023   Procedure: FIDUCIAL MARKER PLACEMENT;  Surgeon: Leslye Peer, MD;  Location: [facility] ENDOSCOPY;  Service: Pulmonary;;   INGUINAL HERNIA REPAIR Bilateral    IR IMAGING GUIDED PORT INSERTION  11/15/2023   KNEE SURGERY Left 1990s   "growth on the back of my knee removed"   LEFT HEART CATH AND CORONARY ANGIOGRAPHY N/A 03/30/2018   Procedure: LEFT  HEART CATH AND CORONARY ANGIOGRAPHY;  Surgeon: Kathleene Hazel, MD;  Location: [facility] INVASIVE CV LAB;  Service: Cardiovascular;  Laterality: N/A;   VIDEO BRONCHOSCOPY WITH ENDOBRONCHIAL NAVIGATION N/A 10/13/2021   Procedure: ROBOTIC VIDEO BRONCHOSCOPY WITH ENDOBRONCHIAL NAVIGATION;  Surgeon: Leslye Peer, MD;  Location: [facility] ENDOSCOPY;  Service: Pulmonary;  Laterality: N/A;   VIDEO BRONCHOSCOPY WITH RADIAL ENDOBRONCHIAL ULTRASOUND  10/13/2021   Procedure: RADIAL ENDOBRONCHIAL ULTRASOUND;  Surgeon: Leslye Peer, MD;  Location: [facility] ENDOSCOPY;  Service: Pulmonary;;    FAMILY HISTORY:  Family History  Problem Relation Age of Onset   Leukemia Mother    Cancer Father    Cancer Sister    Cancer Brother    CAD Brother     SOCIAL HISTORY:  Social History   Socioeconomic History   Marital status: Divorced    Spouse name: Not on file   Number of children: 1   Years of education: Not on file   Highest education level: Not on file  Occupational History    Not on file  Tobacco Use   Smoking status: Former    Current packs/day: 1.50    Average packs/day: 1.5 packs/day for 47.0 years (70.5 ttl pk-yrs)    Types: Cigarettes   Smokeless tobacco: Former   Tobacco comments:    1 cigarette smoked daily 05/28/22 [facility]   Vaping Use   Vaping status: Never Used  Substance and Sexual Activity   Alcohol use: Not Currently    Comment: 11/08/2018 "stopped 09/23/1998"   Drug use: Not Currently    Comment: 11/08/2018 "nothing in the 2000s"   Sexual activity: Yes  Other Topics Concern   Not on file  Social History Narrative   Retired   Chief Executive Officer [employer] investment banker Strain: Not on file  Food Insecurity: No Food Insecurity (01/07/2024)   Hunger Vital Sign    Worried About Running Out of Food in the Last Year: Never true    Ran Out of Food in the Last Year: Never true  Transportation Needs: No Transportation Needs (01/07/2024)   PRAPARE - Administrator, Civil Service (Medical): No    Lack of Transportation (Non-Medical): No  Physical Activity: Not on file  Stress: Not on file  Social Connections: Unknown (03/02/2023)   Received from [facility]   Social Network    Social Network: Not on file  Intimate Partner Violence: Not At Risk (01/07/2024)   Humiliation, Afraid, Rape, and Kick questionnaire    Fear of Current or Ex-Partner: No    Emotionally Abused: No    Physically Abused: No    Sexually Abused: No    ALLERGIES: Patient has no known allergies.  MEDICATIONS:  Current Outpatient Medications  Medication Sig Dispense Refill   aspirin 81 MG EC tablet Take 1 tablet (81 mg total) by mouth daily. (Patient not taking: Reported on 12/23/2023) 30 tablet 6   atorvastatin (LIPITOR) 40 MG tablet Take 1 tablet (40 mg total) by mouth at bedtime.     carvedilol (COREG) 25 MG tablet Take 12.5 mg by mouth daily.     chlorproMAZINE (THORAZINE) 25 MG tablet Take 1 tablet (25 mg total) by mouth 3 (three) times daily as needed  (Hiccups). 45 tablet 2   dexamethasone (DECADRON) 4 MG tablet Take 1 tablet (4 mg total) by mouth every 8 (eight) hours. (Patient not taking: Reported on 01/07/2024) 90 tablet 1   dexamethasone (DECADRON) 4 MG tablet Take 1 tab 2 times daily  starting day before pemetrexed. Then take 2 tabs daily x 3 days starting day after carboplatin. Take with food. (Patient not taking: Reported on 01/07/2024) 30 tablet 1   furosemide (LASIX) 20 MG tablet Take 1 tablet (20 mg total) by mouth daily. As directed 30 tablet 11   lidocaine-prilocaine (EMLA) cream Apply to affected area once 30 g 3   metFORMIN (GLUCOPHAGE) 500 MG tablet Take 1 tablet (500 mg total) by mouth 2 (two) times daily with a meal. (Patient not taking: Reported on 12/23/2023) 60 tablet 0   metoCLOPramide (REGLAN) 5 MG tablet Take 1 tablet (5 mg total) by mouth every 8 (eight) hours as needed for nausea. 30 tablet 0   Multiple Vitamin (MULTIVITAMIN WITH MINERALS) TABS tablet Take 1 tablet by mouth daily.     nicotine (NICODERM CQ - DOSED IN MG/24 HOURS) 21 mg/24hr patch Place 21 mg onto the skin daily. Last patch was applied 3-4 days ago, no patch is on today 11/12/2023.     ondansetron (ZOFRAN) 8 MG tablet Take 1 tablet (8 mg total) by mouth every 8 (eight) hours as needed for nausea or vomiting. Start on the third day after carboplatin. 30 tablet 1   prochlorperazine (COMPAZINE) 10 MG tablet Take 1 tablet (10 mg total) by mouth every 6 (six) hours as needed for nausea or vomiting. 30 tablet 1   spironolactone (ALDACTONE) 25 MG tablet Take 0.5 tablets (12.5 mg total) by mouth daily. 15 tablet 5   No current facility-administered medications for this encounter.    REVIEW OF SYSTEMS:  On review of systems, the patient reports that he is doing well overall. He denies any chest pain, shortness of breath, cough, fevers, chills, night sweats, unintended weight changes. He denies any bowel or bladder disturbances, and denies abdominal pain, nausea or  vomiting. He denies any new musculoskeletal or joint aches or pains. A complete review of systems is obtained and is otherwise negative.    PHYSICAL EXAM:  Wt Readings from Last 3 Encounters:  01/07/24 153 lb 2 oz (69.5 kg)  12/27/23 140 lb (63.5 kg)  12/17/23 145 lb (65.8 kg)   Temp Readings from Last 3 Encounters:  01/07/24 (!) 97.1 F (36.2 C) (Temporal)  12/27/23 97.6 F (36.4 C)  12/03/23 98 F (36.7 C) (Temporal)   BP Readings from Last 3 Encounters:  01/07/24 (!) 130/50  12/27/23 (!) 123/58  12/17/23 124/60   Pulse Readings from Last 3 Encounters:  01/07/24 79  12/27/23 75  12/17/23 71   Pain Assessment Pain Score: 0-No pain/10  In general this is a well appearing African American man in no acute distress. He's alert and oriented x4 and appropriate throughout the examination. Cardiopulmonary assessment is negative for acute distress and he exhibits normal effort.     KPS = 100  100 - Normal; no complaints; no evidence of disease. 90   - Able to carry on normal activity; minor signs or symptoms of disease. 80   - Normal activity with effort; some signs or symptoms of disease. 83   - Cares for self; unable to carry on normal activity or to do active work. 60   - Requires occasional assistance, but is able to care for most of his personal needs. 50   - Requires considerable assistance and frequent medical care. 40   - Disabled; requires special care and assistance. 30   - Severely disabled; hospital admission is indicated although death not imminent. 20   - Very sick;  hospital admission necessary; active supportive treatment necessary. 10   - Moribund; fatal processes progressing rapidly. 0     - Dead  Karnofsky DA, Abelmann WH, Craver LS and Burchenal JH [phone]) The use of the nitrogen mustards in the palliative treatment of carcinoma: with particular reference to bronchogenic carcinoma Cancer 1 634-56  LABORATORY DATA:  Lab Results  Component Value Date   WBC  15.3 (H) 12/03/2023   HGB 14.3 12/27/2023   HCT 42.0 12/27/2023   MCV 88.0 12/03/2023   PLT 117 (L) 12/03/2023   Lab Results  Component Value Date   NA 139 12/27/2023   K 5.5 (H) 12/27/2023   CL 105 12/27/2023   CO2 28 12/03/2023   Lab Results  Component Value Date   ALT 23 12/03/2023   AST 15 12/03/2023   ALKPHOS 115 12/03/2023   BILITOT 0.7 12/03/2023     RADIOGRAPHY: DG Chest Port 1 View Result Date: 12/27/2023 CLINICAL DATA:  Post bronchoscopy with biopsy EXAM: PORTABLE CHEST 1 VIEW COMPARISON:  10/13/2021 FINDINGS: Right Port-A-Cath in place with the tip in the SVC. Left AICD remains unchanged. Heart and mediastinal contours are within normal limits. No focal opacities or effusions. No acute bony abnormality. Aortic atherosclerosis. IMPRESSION: No active disease. Electronically Signed   By: Charlett Nose M.D.   On: 12/27/2023 16:46   DG C-ARM BRONCHOSCOPY Result Date: 12/27/2023 C-ARM BRONCHOSCOPY: Fluoroscopy was utilized by the requesting physician.  No radiographic interpretation.      IMPRESSION/PLAN: 1. 73 y.o. 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 We discussed the natural history of early stage non-small cell lung cancer, adenocarcinoma and general treatment, highlighting the role of radiotherapy in the management. We discussed the available radiation techniques, and focused on the details and logistics of delivery.  The recommendation is for a 3-5 fraction course of stereotactic body radiotherapy (SBRT) to the new, left upper lobe lung nodule.  We reviewed the anticipated acute and late sequelae associated with radiation in this setting. The patient was encouraged to ask questions that were answered to his stated satisfaction.  At the end of our conversation, the patient is in  agreement to proceed with the recommended 3-5 fraction course of stereotactic body radiotherapy (SBRT) to the new, left upper lobe lung nodule.  He has freely signed written consent to proceed today in the office and a copy of this document will be placed in his medical record.  He we will have CT simulation/treatment planning following our visit this morning, in anticipation of beginning his treatments in the near future.  He will need transportation to and from his treatment visit so we will connect him with our transportation coordinator here at the cancer center.  We will share our discussion with his team at the [facility] and proceed with treatment planning accordingly.  We enjoyed meeting with him again today and look forward to continuing to participate in his care.  We personally spent 60 minutes in this encounter including chart review, reviewing radiological studies, meeting face-to-face with the patient, entering orders and completing documentation.    Marguarite Arbour, PA-C    Margaretmary Dys, MD  [facility]  Radiation Oncology Direct Dial: [phone]  Fax: [phone] [email or web address]  Skype  LinkedIn   This document serves as a record of  services personally performed by Margaretmary Dys, MD and Marcello Fennel, PA-C. It was created on their behalf by Mickie Bail, a trained medical scribe. The creation of this record is based on the scribe's personal observations and the provider's statements to them. This document has been checked and approved by the attending provider.

## 2024-01-07 NOTE — Progress Notes (Signed)
Patient information emailed to [employer] in regards to setting up transportation.   Ruel Favors, LPN

## 2024-01-07 NOTE — Progress Notes (Signed)
  Radiation Oncology         (336) [phone] ________________________________  Name: Juan Norris MRN: [id number]  Date: 01/07/2024  DOB: October 14, 1951  STEREOTACTIC BODY RADIOTHERAPY SIMULATION AND TREATMENT PLANNING NOTE    ICD-10-CM   1. Primary cancer of left upper lobe of lung (HCC)  C34.12       DIAGNOSIS:  73 yo gentleman with  Stage IA2 (cT1b, cN0, cM0)  Adenocarcinoma of the left upper lobe of the lung  NARRATIVE:  The patient was brought to the CT Simulation planning suite.  Identity was confirmed.  All relevant records and images related to the planned course of therapy were reviewed.  The patient freely provided informed written consent to proceed with treatment after reviewing the details related to the planned course of therapy. The consent form was witnessed and verified by the simulation staff.  Then, the patient was set-up in a stable reproducible  supine position for radiation therapy.  A BodyFix immobilization pillow was fabricated for reproducible positioning.  Then I personally applied the abdominal compression paddle to limit respiratory excursion.  4D respiratoy motion management CT images were obtained.  Surface markings were placed.  The CT images were loaded into the planning software.  Then, using Cine, MIP, and standard views, the internal target volume (ITV) and planning target volumes (PTV) were delinieated, and avoidance structures were contoured.  Treatment planning then occurred.  The radiation prescription was entered and confirmed.  A total of two complex treatment devices were fabricated in the form of the BodyFix immobilization pillow and a neck accuform cushion.  I have requested : 3D Simulation  I have requested a DVH of the following structures: Heart, Lungs, Esophagus, Chest Wall, Brachial Plexus, Major Blood Vessels, and targets.  SPECIAL TREATMENT PROCEDURE:  The planned course of therapy using radiation constitutes a special treatment procedure. Special care is  required in the management of this patient for the following reasons. This treatment constitutes a Special Treatment Procedure for the following reason: [ High dose per fraction requiring special monitoring for increased toxicities of treatment including daily imaging..  The special nature of the planned course of radiotherapy will require increased physician supervision and oversight to ensure patient's safety with optimal treatment outcomes.  This requires extended time and effort.    RESPIRATORY MOTION MANAGEMENT SIMULATION:  In order to account for effect of respiratory motion on target structures and other organs in the planning and delivery of radiotherapy, this patient underwent respiratory motion management simulation.  To accomplish this, when the patient was brought to the CT simulation planning suite, 4D respiratoy motion management CT images were obtained.  The CT images were loaded into the planning software.  Then, using a variety of tools including Cine, MIP, and standard views, the target volume and planning target volumes (PTV) were delineated.  Avoidance structures were contoured.  Treatment planning then occurred.  Dose volume histograms were generated and reviewed for each of the requested structure.  The resulting plan was carefully reviewed and approved today.  PLAN:  The patient will receive 54 Gy in 3 fractions.  ________________________________  Artist Pais Kathrynn Running, M.D.

## 2024-01-07 NOTE — Progress Notes (Signed)
Nursing interview for Malignant neoplasm of lower lobe of right lung (HCC) Stage IVB (cTX, cNX, cM1c)   Patient identity verified x2.  Patient reports doing well. Patient denies any related issues at this time.  Meaningful use complete.  Vitals- BP (!) 130/50 (BP Location: Left Arm, Patient Position: Bed low/side rails up)   Pulse 79   Temp (!) 97.1 F (36.2 C) (Temporal)   Resp 18   Ht 5\' 6"  (1.676 m)   Wt 153 lb 2 oz (69.5 kg)   SpO2 100%   BMI 24.72 kg/m   This concludes the interaction.  Ruel Favors, LPN

## 2024-01-13 DIAGNOSIS — C3412 Malignant neoplasm of upper lobe, left bronchus or lung: Secondary | ICD-10-CM | POA: Diagnosis not present

## 2024-01-13 DIAGNOSIS — Z510 Encounter for antineoplastic radiation therapy: Secondary | ICD-10-CM | POA: Diagnosis not present

## 2024-01-18 ENCOUNTER — Ambulatory Visit

## 2024-01-18 ENCOUNTER — Telehealth: Payer: Self-pay | Admitting: Radiation Oncology

## 2024-01-18 NOTE — Telephone Encounter (Signed)
 2/4 @ 1:45 pm patient called with concerns that he may be late for his treatment appointment for today, due to transportation has not arrived at his residence yet to pick him up for his 2:45 pm appointment.  Left voicemail with Sherlean and Geni Teaching Laboratory Technician).  Follow up call to [employer] spoke to [place] then [place] call to [place].

## 2024-01-20 ENCOUNTER — Other Ambulatory Visit: Payer: Self-pay

## 2024-01-20 ENCOUNTER — Ambulatory Visit
Admission: RE | Admit: 2024-01-20 | Discharge: 2024-01-20 | Disposition: A | Discharge status: Home / Self Care | Source: Ambulatory Visit | Attending: Radiation Oncology | Admitting: Radiation Oncology

## 2024-01-20 DIAGNOSIS — C3412 Malignant neoplasm of upper lobe, left bronchus or lung: Secondary | ICD-10-CM | POA: Diagnosis not present

## 2024-01-20 DIAGNOSIS — Z510 Encounter for antineoplastic radiation therapy: Secondary | ICD-10-CM | POA: Insufficient documentation

## 2024-01-20 LAB — RAD ONC ARIA SESSION SUMMARY
Course Elapsed Days: 0
Plan Fractions Treated to Date: 1
Plan Prescribed Dose Per Fraction: 18 Gy
Plan Total Fractions Prescribed: 3
Plan Total Prescribed Dose: 54 Gy
Reference Point Dosage Given to Date: 18 Gy
Reference Point Session Dosage Given: 18 Gy
Session Number: 1

## 2024-01-24 ENCOUNTER — Other Ambulatory Visit: Payer: Self-pay

## 2024-01-24 ENCOUNTER — Ambulatory Visit
Admission: RE | Admit: 2024-01-24 | Discharge: 2024-01-24 | Disposition: A | Discharge status: Home / Self Care | Source: Ambulatory Visit | Attending: Radiation Oncology | Admitting: Radiation Oncology

## 2024-01-24 DIAGNOSIS — C3412 Malignant neoplasm of upper lobe, left bronchus or lung: Secondary | ICD-10-CM | POA: Diagnosis not present

## 2024-01-24 DIAGNOSIS — Z510 Encounter for antineoplastic radiation therapy: Secondary | ICD-10-CM | POA: Diagnosis not present

## 2024-01-24 LAB — RAD ONC ARIA SESSION SUMMARY
Course Elapsed Days: 4
Plan Fractions Treated to Date: 2
Plan Prescribed Dose Per Fraction: 18 Gy
Plan Total Fractions Prescribed: 3
Plan Total Prescribed Dose: 54 Gy
Reference Point Dosage Given to Date: 36 Gy
Reference Point Session Dosage Given: 18 Gy
Session Number: 2

## 2024-01-25 ENCOUNTER — Ambulatory Visit

## 2024-01-26 ENCOUNTER — Ambulatory Visit

## 2024-01-27 ENCOUNTER — Ambulatory Visit
Admission: RE | Admit: 2024-01-27 | Discharge: 2024-01-27 | Disposition: A | Discharge status: Home / Self Care | Source: Ambulatory Visit | Attending: Radiation Oncology | Admitting: Radiation Oncology

## 2024-01-27 ENCOUNTER — Ambulatory Visit

## 2024-01-27 ENCOUNTER — Other Ambulatory Visit: Payer: Self-pay

## 2024-01-27 DIAGNOSIS — Z510 Encounter for antineoplastic radiation therapy: Secondary | ICD-10-CM | POA: Diagnosis not present

## 2024-01-27 DIAGNOSIS — C3412 Malignant neoplasm of upper lobe, left bronchus or lung: Secondary | ICD-10-CM | POA: Diagnosis not present

## 2024-01-27 LAB — RAD ONC ARIA SESSION SUMMARY
Course Elapsed Days: 7
Plan Fractions Treated to Date: 3
Plan Prescribed Dose Per Fraction: 18 Gy
Plan Total Fractions Prescribed: 3
Plan Total Prescribed Dose: 54 Gy
Reference Point Dosage Given to Date: 54 Gy
Reference Point Session Dosage Given: 18 Gy
Session Number: 3

## 2024-01-28 ENCOUNTER — Ambulatory Visit

## 2024-01-28 NOTE — Radiation Completion Notes (Addendum)
  Radiation Oncology         (336) [phone] ________________________________  Name: Juan Norris MRN: [id number]  Date: 01/27/2024  DOB: 09/03/1951  Referring Physician: Elenor Quinones ADDO, M.D. Date of Service: 2024-01-28 Radiation Oncologist: Margaretmary Bayley, M.D. 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  He tolerated the treatments relatively well with modest fatigue.  The patient will receive a call in about one month from the radiation oncology department. He will continue follow up with his medical oncologist at the [facility], Dr. Mathis Bud, as well.  ------------------------------------------------   Margaretmary Dys, MD [facility]  Radiation Oncology Direct Dial: [phone]  Fax: [phone] [email or web address]  Skype  LinkedIn

## 2024-01-31 ENCOUNTER — Ambulatory Visit: Payer: TRICARE For Life (TFL) | Attending: Cardiovascular Disease

## 2024-01-31 DIAGNOSIS — I5042 Chronic combined systolic (congestive) and diastolic (congestive) heart failure: Secondary | ICD-10-CM | POA: Diagnosis not present

## 2024-01-31 DIAGNOSIS — Z95810 Presence of automatic (implantable) cardiac defibrillator: Secondary | ICD-10-CM

## 2024-02-01 ENCOUNTER — Telehealth: Payer: Self-pay

## 2024-02-01 NOTE — Progress Notes (Unsigned)
 EPIC Encounter for ICM Monitoring  Patient Name: Juan Norris is a 73 y.o. male Date: 02/01/2024 Primary Care Physican: [facility] Primary Cardiologist: Shari Prows Electrophysiologist: Mealor Bi-V Pacing:   95.5%       02/22/2023 Weight: 151 lbs 06/11/2023 Weight: 150-151 lbs 08/20/2023 Weight: 150 lbs 02/01/2024 Weight: 152 lbs                                                           Spoke with patient and heart failure questions reviewed.  Transmission results reviewed.  Pt asymptomatic for fluid accumulation.   He is unsure what is causing fluid retention.   He is received last cancer treatment today.    Diet: 12/31/2023 He does not limit fluid or salt.  Appetite is good despite lung cancer treatments.    Optivol Thoracic impedance continues suggesting possible fluid accumulation starting 1/19.  Fluid index greater than normal starting 1/29.   Prescribed:  Furosemide 20 mg take 1 tablet by mouth daily as directed  Spironolactone 25 mg take 0.5 tablet (12.5 mg total) daily   Labs: 01/04/2024 Creatinine 1.04, BUN 10, Potassium 4.8, Sodium 137, GFR 76 12/27/2023 Creatinine 1.10, BUN 28, Potassium 5.5, Sodium 139  12/03/2023 Creatinine 1.15, BUN 28, Potassium 4.6, Sodium 135, GFR >60  11/15/2023 Creatinine 1.07, BUN 18, Potassium 4.2, Sodium 138, GFR >60  A complete set of results can be found in Results Review.   Recommendations: Advised to take 2 tablets (40 mg total) of Furosemide 20 mg x 3 days only then return to 1 tablet daily.    Copy sent to Robin Searing NP (pts last visit 11/19/2022) for review.   Follow-up plan: ICM clinic phone appointment on 02/09/2024 to recheck fluid levels.   91 day device clinic remote transmission 2/26/20254.      EP/Cardiology Office Visits:  Recall 05/29/2024 with Dr Nelly Laurence.  Aware to call Dr Devin Going office to schedule over due appt with general cardiologist or PA/NP.   Copy of ICM check sent to Dr. Nelly Laurence.   3 month ICM trend:  01/31/2024.    12-14 Month ICM trend:     Karie Soda, RN 02/01/2024 1:02 PM

## 2024-02-01 NOTE — Telephone Encounter (Signed)
 Spoke with patient, appointment with Robin Searing, NP scheduled for 02/03/24 at 2:45 pm.

## 2024-02-01 NOTE — Telephone Encounter (Signed)
-----   Message from Napoleon Form, Leodis Rains sent at 02/01/2024  1:43 PM EST ----- Please contact Mr. Robicheaux and schedule for follow-up due to recent fluid accumulation in the setting of CHF.  Thank you, Robin Searing, NP

## 2024-02-02 NOTE — Progress Notes (Unsigned)
 Cardiology Office Note    Patient Name: Juan Norris Date of Encounter: 02/03/2024  Primary Care Provider:  [facility] Primary Cardiologist:  None Primary Electrophysiologist: Hillis Range, MD (Inactive)   Past Medical History    Past Medical History:  Diagnosis Date   Acute renal insufficiency    AICD (automatic cardioverter/defibrillator) present    Anginal pain (HCC)    CHF (congestive heart failure) (HCC)    Chronic lower back pain    "due to 3 ruptured discs in my back" (11/08/2018)   High cholesterol    High cholesterol    History of kidney stones 1982   "passed them"   History of radiation therapy    Right lung- 11/03/21-11/14/21- Dr. Joylene Draft   Hypertension    Lung cancer [facility])    Myocardial infarction (HCC) 03/2018   Systolic heart failure (HCC)    Tobacco use     History of Present Illness  Juan Norris is a 73 y.o. 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   This past was last seen on 11/19/2022 with complaint of lower extremity edema and increased impedance on OptiVol settings.  He was euvolemic with no shortness of breath noted.  His blood pressures were also stable and was Lasix 20 mg twice daily x 3 days.  ED on 11/12/2023 with diplopia and dizziness and CT of the head was completed on masses in the left occipital lobe and right posterior fossa with significant edema.  He underwent SRS for treatment of multiple brain masses on 11/19/2023.  He underwent repeat PET imaging on 11/29/2023 to restage that showed new irregular subpleural nodule in the left upper lobe. He was  recently evaluated with bronchoscopy and biopsies by Dr. 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  He will undergo 3  treatments of SBRT once approved by the [place]. He was contacted by the device clinic with increased OptiVol thoracic impedance starting 01/02/2024 and patient was advised to increase Lasix to 40 mg x 3 days then return to mg daily.  [name] presents today for follow-up and elevation in thoracic impedance per device. He reports a weight gain of 17 pounds over the past month, which he attributes to an increased appetite following the completion of his radiation therapy for cancer. He denies any new symptoms such as shortness of breath, fatigue, or swelling in the hands or ankles. He also denies any changes in his medication regimen, except for the discontinuation of spironolactone due to elevated potassium levels. The patient has been taking Lasix and carvedilol as prescribed. He reports no increase in urination output despite the increase in Lasix dosage. The patient also mentions a few instances of consuming fried foods, which are high in salt, but otherwise maintains a low-salt diet.  He is euvolemic on examination today and blood pressure is stable at 108/50.   Patient denies chest pain, palpitations, dyspnea, PND, orthopnea, nausea, vomiting, dizziness, syncope, edema, weight gain, or early satiety.  Review of Systems  Please see the history of present illness.    All other systems reviewed and are otherwise negative except as noted above.  Physical Exam    Wt Readings from Last 3 Encounters:  02/03/24 157 lb (71.2 kg)  01/07/24 153 lb 2 oz (69.5 kg)  12/27/23 140 lb (63.5 kg)   VS: Vitals:   02/03/24 1242  BP: (!) 108/50  Pulse: 76  SpO2: 98%  ,Body mass index is 25.34 kg/m. GEN: Well nourished, well developed in no acute distress Neck: No JVD; No carotid bruits Pulmonary: Clear to auscultation without rales, wheezing or rhonchi  Cardiovascular: Normal rate. Regular rhythm. Normal S1. Normal S2.   Murmurs: There is no murmur.  ABDOMEN: Soft, non-tender, non-distended EXTREMITIES:  No  edema; No deformity   EKG/LABS/ Recent Cardiac Studies   ECG personally reviewed by me today -none completed today  Risk Assessment/Calculations:       Lab Results  Component Value Date   WBC 15.3 (H) 12/03/2023   HGB 14.3 12/27/2023   HCT 42.0 12/27/2023   MCV 88.0 12/03/2023   PLT 117 (L) 12/03/2023   Lab Results  Component Value Date   CREATININE 1.10 12/27/2023   BUN 28 (H) 12/27/2023   NA 139 12/27/2023   K 5.5 (H) 12/27/2023   CL 105 12/27/2023   CO2 28 12/03/2023   Lab Results  Component Value Date   CHOL 93 (L) 02/12/2021   HDL 33 (L) 02/12/2021   LDLCALC 48 02/12/2021   TRIG 44 02/12/2021   CHOLHDL 2.8 02/12/2021    Lab Results  Component Value Date   HGBA1C 6.4 (H) 11/14/2023   Assessment & Plan    1. NICM/chronic combined systolic and diastolic CHF: -s/p Medtronic CRT-D implant in 2019 for reduced EF of 20-25%, -2D echo was repeated 09/2021 with EF still depressed at 25-30% -Continue Entresto 24/26 mg twice daily,carvedilol 12.5 mg daily -He reports no new symptoms of exacerbation such as shortness of breath or fatigue.  -Continue Lasix 20mg  daily, with an additional 20mg  as needed for weight gain of 2 pounds in 24 hours or 5 pounds in a week. -Check kidney function and B-type natriuretic peptide (BNP) today to assess for possible heart failure exacerbation.  2.  Lower extremity swelling: -Patient is euvolemic on exam with no lower extremity edema present. -Continue Lasix 20 mg daily  3.  Essential HTN: -Patient's blood pressure today was stable at 108/50 -Continue carvedilol 12.5 mg twice daily Entresto 24/26 mg twice daily  4. Nonobstructive CAD: -s/p NSTEMI 03/2016 and repeat cath 2019 with possible vasospasm as presenting factor. -Today patient reports no chest pain or angina since previous follow-up. -Continue current GDMT with ASA 81 mg, Lipitor 40 mg daily  5.  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 Hyperlipidemia: -Patient's last LDL cholesterol was 48 -Continue Lipitor 40 mg daily  Disposition: Follow-up with None or APP in 12 months    Signed, Napoleon Form, Leodis Rains, NP 02/03/2024, 12:49 PM [facility]

## 2024-02-03 ENCOUNTER — Encounter: Payer: Self-pay | Admitting: Nurse Practitioner

## 2024-02-03 ENCOUNTER — Ambulatory Visit: Payer: TRICARE For Life (TFL) | Attending: Nurse Practitioner | Admitting: Nurse Practitioner

## 2024-02-03 VITALS — BP 108/50 | HR 76 | Ht 66.0 in | Wt 157.0 lb

## 2024-02-03 DIAGNOSIS — Z95810 Presence of automatic (implantable) cardiac defibrillator: Secondary | ICD-10-CM | POA: Diagnosis not present

## 2024-02-03 DIAGNOSIS — I10 Essential (primary) hypertension: Secondary | ICD-10-CM

## 2024-02-03 DIAGNOSIS — C3412 Malignant neoplasm of upper lobe, left bronchus or lung: Secondary | ICD-10-CM

## 2024-02-03 DIAGNOSIS — I428 Other cardiomyopathies: Secondary | ICD-10-CM | POA: Diagnosis not present

## 2024-02-03 DIAGNOSIS — I5042 Chronic combined systolic (congestive) and diastolic (congestive) heart failure: Secondary | ICD-10-CM | POA: Diagnosis not present

## 2024-02-03 DIAGNOSIS — J449 Chronic obstructive pulmonary disease, unspecified: Secondary | ICD-10-CM

## 2024-02-03 DIAGNOSIS — Z720 Tobacco use: Secondary | ICD-10-CM

## 2024-02-03 MED ORDER — FUROSEMIDE 20 MG PO TABS: 20.0000 mg | ORAL_TABLET | Freq: Every day | ORAL | 3 refills | Status: AC

## 2024-02-03 NOTE — Progress Notes (Signed)
 Pt returned call and stated his monitor is not working.  Provided Medtronic Support number and advised to call to get assistance with monitor.

## 2024-02-03 NOTE — Patient Instructions (Signed)
 Medication Instructions:  Your physician has recommended you make the following change in your medication:   CONTINUE the Lasix by taking 1 tablet daily, you may take 1 additional tablet only as needed for weight gain of 3 lbs in 24 hours or 5 lbs in 1 week  *If you need a refill on your cardiac medications before your next appointment, please call your pharmacy*   Lab Work: TODAY:  BMET & PRO BNP  If you have labs (blood work) drawn today and your tests are completely normal, you will receive your results only by: MyChart Message (if you have MyChart) OR A paper copy in the mail If you have any lab test that is abnormal or we need to change your treatment, we will call you to review the results.   Testing/Procedures: None ordered   Follow-Up: At [facility], you and your health needs are our priority.  As part of our continuing mission to provide you with exceptional heart care, we have created designated Provider Care Teams.  These Care Teams include your primary Cardiologist (physician) and Advanced Practice Providers (APPs -  Physician Assistants and Nurse Practitioners) who all work together to provide you with the care you need, when you need it.  We recommend signing up for the patient portal called "MyChart".  Sign up information is provided on this After Visit Summary.  MyChart is used to connect with patients for Virtual Visits (Telemedicine).  Patients are able to view lab/test results, encounter notes, upcoming appointments, etc.  Non-urgent messages can be sent to your provider as well.   To learn more about what you can do with MyChart, go to [email or web address].    Your next appointment:   12 month(s)  Provider:   Little Ishikawa, MD     Other Instructions  Low-Sodium Eating Plan Salt (sodium) helps you keep a healthy balance of fluids in your body. Too much sodium can raise your blood pressure. It can also cause fluid and waste to be held in  your body. Your health care provider or dietitian may recommend a low-sodium eating plan if you have high blood pressure (hypertension), kidney disease, liver disease, or heart failure. Eating less sodium can help lower your blood pressure and reduce swelling. It can also protect your heart, liver, and kidneys. What are tips for following this plan? Reading food labels  Check food labels for the amount of sodium per serving. If you eat more than one serving, you must multiply the listed amount by the number of servings. Choose foods with less than 140 milligrams (mg) of sodium per serving. Avoid foods with 300 mg of sodium or more per serving. Always check how much sodium is in a product, even if the label says "unsalted" or "no salt added." Shopping  Buy products labeled as "low-sodium" or "no salt added." Buy fresh foods. Avoid canned foods and pre-made or frozen meals. Avoid canned, cured, or processed meats. Buy breads that have less than 80 mg of sodium per slice. Cooking  Eat more home-cooked food. Try to eat less restaurant, buffet, and fast food. Try not to add salt when you cook. Use salt-free seasonings or herbs instead of table salt or sea salt. Check with your provider or pharmacist before using salt substitutes. Cook with plant-based oils, such as canola, sunflower, or olive oil. Meal planning When eating at a restaurant, ask if your food can be made with less salt or no salt. Avoid dishes labeled as brined, pickled,  cured, or smoked. Avoid dishes made with soy sauce, miso, or teriyaki sauce. Avoid foods that have monosodium glutamate (MSG) in them. MSG may be added to some restaurant food, sauces, soups, bouillon, and canned foods. Make meals that can be grilled, baked, poached, roasted, or steamed. These are often made with less sodium. General information Try to limit your sodium intake to 1,500-2,300 mg each day, or the amount told by your provider. What foods should I  eat? Fruits Fresh, frozen, or canned fruit. Fruit juice. Vegetables Fresh or frozen vegetables. "No salt added" canned vegetables. "No salt added" tomato sauce and paste. Low-sodium or reduced-sodium tomato and vegetable juice. Grains Low-sodium cereals, such as oats, puffed wheat and rice, and shredded wheat. Low-sodium crackers. Unsalted rice. Unsalted pasta. Low-sodium bread. Whole grain breads and whole grain pasta. Meats and other proteins Fresh or frozen meat, poultry, seafood, and fish. These should have no added salt. Low-sodium canned tuna and salmon. Unsalted nuts. Dried peas, beans, and lentils without added salt. Unsalted canned beans. Eggs. Unsalted nut butters. Dairy Milk. Soy milk. Cheese that is naturally low in sodium, such as ricotta cheese, fresh mozzarella, or Swiss cheese. Low-sodium or reduced-sodium cheese. Cream cheese. Yogurt. Seasonings and condiments Fresh and dried herbs and spices. Salt-free seasonings. Low-sodium mustard and ketchup. Sodium-free salad dressing. Sodium-free light mayonnaise. Fresh or refrigerated horseradish. Lemon juice. Vinegar. Other foods Homemade, reduced-sodium, or low-sodium soups. Unsalted popcorn and pretzels. Low-salt or salt-free chips. The items listed above may not be all the foods and drinks you can have. Talk to a dietitian to learn more. What foods should I avoid? Vegetables Sauerkraut, pickled vegetables, and relishes. Olives. [place] fries. Onion rings. Regular canned vegetables, except low-sodium or reduced-sodium items. Regular canned tomato sauce and paste. Regular tomato and vegetable juice. Frozen vegetables in sauces. Grains Instant hot cereals. Bread stuffing, pancake, and biscuit mixes. Croutons. Seasoned rice or pasta mixes. Noodle soup cups. Boxed or frozen macaroni and cheese. Regular salted crackers. Self-rising flour. Meats and other proteins Meat or fish that is salted, canned, smoked, spiced, or pickled. Precooked or  cured meat, such as sausages or meat loaves. Tomasa Blase. Ham. Pepperoni. Hot dogs. Corned beef. Chipped beef. Salt pork. Jerky. Pickled herring, anchovies, and sardines. Regular canned tuna. Salted nuts. Dairy Processed cheese and cheese spreads. Hard cheeses. Cheese curds. Blue cheese. Feta cheese. String cheese. Regular cottage cheese. Buttermilk. Canned milk. Fats and oils Salted butter. Regular margarine. Ghee. Bacon fat. Seasonings and condiments Onion salt, garlic salt, seasoned salt, table salt, and sea salt. Canned and packaged gravies. Worcestershire sauce. Tartar sauce. Barbecue sauce. Teriyaki sauce. Soy sauce, including reduced-sodium soy sauce. Steak sauce. Fish sauce. Oyster sauce. Cocktail sauce. Horseradish that you find on the shelf. Regular ketchup and mustard. Meat flavorings and tenderizers. Bouillon cubes. Hot sauce. Pre-made or packaged marinades. Pre-made or packaged taco seasonings. Relishes. Regular salad dressings. Salsa. Other foods Salted popcorn and pretzels. Corn chips and puffs. Potato and tortilla chips. Canned or dried soups. Pizza. Frozen entrees and pot pies. The items listed above may not be all the foods and drinks you should avoid. Talk to a dietitian to learn more. This information is not intended to replace advice given to you by your health care provider. Make sure you discuss any questions you have with your health care provider. Document Revised: 12/17/2022 Document Reviewed: 12/17/2022 Elsevier Patient Education  2024 Elsevier Inc.   1st Floor: - Lobby - Registration  - Pharmacy  - Lab - Cafe  2nd  Floor: - PV Lab - Diagnostic Testing (echo, CT, nuclear med)  3rd Floor: - Vacant  4th Floor: - TCTS (cardiothoracic surgery) - AFib Clinic - Structural Heart Clinic - Vascular Surgery  - Vascular Ultrasound  5th Floor: - HeartCare Cardiology (general and EP) - Clinical Pharmacy for coumadin, hypertension, lipid, weight-loss medications, and med  management appointments    Valet parking services will be available as well.

## 2024-02-03 NOTE — Progress Notes (Signed)
 Pt has an appointment with Robin Searing, NP 2/20 for follow up.   Attempted to call patient to request to send a manual remote transmission before today's appointment.

## 2024-02-04 LAB — BASIC METABOLIC PANEL
BUN/Creatinine Ratio: 13 (ref 10–24)
BUN: 12 mg/dL (ref 8–27)
CO2: 25 mmol/L (ref 20–29)
Calcium: 9.3 mg/dL (ref 8.6–10.2)
Chloride: 106 mmol/L (ref 96–106)
Creatinine, Ser: 0.93 mg/dL (ref 0.76–1.27)
Glucose: 101 mg/dL — ABNORMAL HIGH (ref 70–99)
Potassium: 4.9 mmol/L (ref 3.5–5.2)
Sodium: 145 mmol/L — ABNORMAL HIGH (ref 134–144)
eGFR: 87 mL/min/{1.73_m2} (ref >59)

## 2024-02-08 ENCOUNTER — Ambulatory Visit: Payer: TRICARE For Life (TFL) | Attending: Cardiovascular Disease

## 2024-02-08 DIAGNOSIS — Z95810 Presence of automatic (implantable) cardiac defibrillator: Secondary | ICD-10-CM

## 2024-02-08 DIAGNOSIS — I5042 Chronic combined systolic (congestive) and diastolic (congestive) heart failure: Secondary | ICD-10-CM

## 2024-02-08 NOTE — Progress Notes (Signed)
 EPIC Encounter for ICM Monitoring  Patient Name: Juan Norris is a 73 y.o. male Date: 02/08/2024 Primary Care Physican: [facility] Primary Cardiologist: Shari Prows Electrophysiologist: Mealor Bi-V Pacing:   95.5%       02/22/2023 Weight: 151 lbs 06/11/2023 Weight: 150-151 lbs 08/20/2023 Weight: 150 lbs 02/01/2024 Weight: 152 lbs                                                           Spoke with patient and heart failure questions reviewed.  Transmission results reviewed.  Pt asymptomatic for fluid accumulation.   He is unsure what is causing fluid retention.      Diet: 12/31/2023 He does not limit fluid or salt.  Appetite is good despite lung cancer treatments.    Optivol Thoracic impedance continues suggesting possible fluid accumulation starting 1/19.  Fluid index greater than normal starting 1/29.   Prescribed:  Furosemide 20 mg take 1 tablet by mouth daily.  May take 1 additional tablet by mouth daily only as needed for weight gain of 2 lbs in 24 hours or 5 lbs in a week.  Spironolactone 25 mg take 0.5 tablet (12.5 mg total) daily   Labs: 02/03/2024 Creatinine 0.93, BUN 12, Potassium 4.9, Sodium 145, GFR 87 01/04/2024 Creatinine 1.04, BUN 10, Potassium 4.8, Sodium 137, GFR 76 12/27/2023 Creatinine 1.10, BUN 28, Potassium 5.5, Sodium 139  12/03/2023 Creatinine 1.15, BUN 28, Potassium 4.6, Sodium 135, GFR >60  11/15/2023 Creatinine 1.07, BUN 18, Potassium 4.2, Sodium 138, GFR >60  A complete set of results can be found in Results Review.   Recommendations: Advised to take 2 tablets (40 mg total) of Furosemide 20 mg x 2 days only then return to 1 tablet daily.    Discussed limiting salt to 2000 mg daily and fluid intake to 64 oz daily.    Follow-up plan: ICM clinic phone appointment on 02/14/2024 to recheck fluid levels.   91 day device clinic remote transmission 05/10/2024.      EP/Cardiology Office Visits:  Recall 05/29/2024 with Dr Nelly Laurence.     Copy of ICM check sent to Dr.  Nelly Laurence.   3 month ICM trend: 02/08/2024.    12-14 Month ICM trend:     Karie Soda, RN 02/08/2024 9:53 AM

## 2024-02-09 ENCOUNTER — Telehealth: Payer: Self-pay

## 2024-02-09 ENCOUNTER — Ambulatory Visit: Payer: Self-pay

## 2024-02-09 DIAGNOSIS — I10 Essential (primary) hypertension: Secondary | ICD-10-CM

## 2024-02-09 DIAGNOSIS — I428 Other cardiomyopathies: Secondary | ICD-10-CM

## 2024-02-09 DIAGNOSIS — I5042 Chronic combined systolic (congestive) and diastolic (congestive) heart failure: Secondary | ICD-10-CM

## 2024-02-09 NOTE — Telephone Encounter (Signed)
-----   Message from Napoleon Form, Leodis Rains sent at 02/04/2024  7:24 AM EST ----- Please let patient know that his renal function is stable and other electrolytes are normal.  We are still awaiting the results of the BNP and will send those once they are available.  Please let me know if you have any further questions and continue your current regimen of diuretics as discussed yesterday follow-up.   Robin Searing, NP

## 2024-02-09 NOTE — Telephone Encounter (Signed)
 Spoke with the pt re: his lab results.. it does not seem that the Pro BNP was drawn... pt has to take a bus to get around... he will stop into the lab the next time he is in the area for another reason.. lab orders released and will bet there when he is able to come and have them drawn.

## 2024-02-10 ENCOUNTER — Other Ambulatory Visit (HOSPITAL_COMMUNITY): Payer: Self-pay | Admitting: Internal Medicine

## 2024-02-10 DIAGNOSIS — C3490 Malignant neoplasm of unspecified part of unspecified bronchus or lung: Secondary | ICD-10-CM

## 2024-02-10 LAB — CUP PACEART REMOTE DEVICE CHECK
Battery Remaining Longevity: 14 mo
Battery Voltage: 2.88 V
Brady Statistic AP VP Percent: 12.33 %
Brady Statistic AP VS Percent: 0.02 %
Brady Statistic AS VP Percent: 85.6 %
Brady Statistic AS VS Percent: 2.06 %
Brady Statistic RA Percent Paced: 11.76 %
Brady Statistic RV Percent Paced: 93.86 %
Date Time Interrogation Session: 20250226001607
HighPow Impedance: 51 Ohm
Implantable Lead Connection Status: 753985
Implantable Lead Connection Status: 753985
Implantable Lead Connection Status: 753985
Implantable Lead Implant Date: 20191126
Implantable Lead Implant Date: 20191126
Implantable Lead Implant Date: 20191126
Implantable Lead Location: 753859
Implantable Lead Location: 753860
Implantable Lead Location: 753860
Implantable Lead Model: 3830
Implantable Lead Model: 5076
Implantable Pulse Generator Implant Date: 20191126
Lead Channel Impedance Value: 266 Ohm
Lead Channel Impedance Value: 266 Ohm
Lead Channel Impedance Value: 285 Ohm
Lead Channel Impedance Value: 342 Ohm
Lead Channel Impedance Value: 399 Ohm
Lead Channel Impedance Value: 437 Ohm
Lead Channel Pacing Threshold Amplitude: 0.5 V
Lead Channel Pacing Threshold Amplitude: 0.5 V
Lead Channel Pacing Threshold Amplitude: 0.625 V
Lead Channel Pacing Threshold Pulse Width: 0.4 ms
Lead Channel Pacing Threshold Pulse Width: 0.4 ms
Lead Channel Pacing Threshold Pulse Width: 0.4 ms
Lead Channel Sensing Intrinsic Amplitude: 1.625 mV
Lead Channel Sensing Intrinsic Amplitude: 1.625 mV
Lead Channel Sensing Intrinsic Amplitude: 12.125 mV
Lead Channel Sensing Intrinsic Amplitude: 12.125 mV
Lead Channel Setting Pacing Amplitude: 1.5 V
Lead Channel Setting Pacing Amplitude: 2.5 V
Lead Channel Setting Pacing Amplitude: 2.5 V
Lead Channel Setting Pacing Pulse Width: 0.4 ms
Lead Channel Setting Pacing Pulse Width: 0.4 ms
Lead Channel Setting Sensing Sensitivity: 0.3 mV
Zone Setting Status: 755011
Zone Setting Status: 755011

## 2024-02-14 ENCOUNTER — Ambulatory Visit: Payer: TRICARE For Life (TFL) | Attending: Cardiovascular Disease

## 2024-02-14 DIAGNOSIS — I5042 Chronic combined systolic (congestive) and diastolic (congestive) heart failure: Secondary | ICD-10-CM

## 2024-02-14 DIAGNOSIS — Z95810 Presence of automatic (implantable) cardiac defibrillator: Secondary | ICD-10-CM

## 2024-02-15 NOTE — Progress Notes (Signed)
 EPIC Encounter for ICM Monitoring  Patient Name: Juan Norris is a 73 y.o. male Date: 02/15/2024 Primary Care Physican: [facility] Primary Cardiologist: None Electrophysiologist: Mealor Bi-V Pacing:   98.4%       02/22/2023 Weight: 151 lbs 06/11/2023 Weight: 150-151 lbs 08/20/2023 Weight: 150 lbs 02/01/2024 Weight: 152 lbs  02/15/2024 Weight: 152 lbs                                                           Spoke with patient and heart failure questions reviewed.  Transmission results reviewed.  Pt asymptomatic for fluid accumulation and is feeling well.       Diet: 12/31/2023 He does not limit fluid or salt.  Appetite is good despite lung cancer treatments.    Optivol Thoracic impedance continues suggesting possible fluid accumulation starting 1/19.  Fluid index greater than normal starting 1/29.   Prescribed:  Furosemide 20 mg take 1 tablet by mouth daily.  May take 1 additional tablet by mouth daily only as needed for weight gain of 2 lbs in 24 hours or 5 lbs in a week.  Spironolactone 25 mg take 0.5 tablet (12.5 mg total) daily   Labs: 02/03/2024 Creatinine 0.93, BUN 12, Potassium 4.9, Sodium 145, GFR 87 01/04/2024 Creatinine 1.04, BUN 10, Potassium 4.8, Sodium 137, GFR 76 12/27/2023 Creatinine 1.10, BUN 28, Potassium 5.5, Sodium 139  12/03/2023 Creatinine 1.15, BUN 28, Potassium 4.6, Sodium 135, GFR >60  11/15/2023 Creatinine 1.07, BUN 18, Potassium 4.2, Sodium 138, GFR >60  A complete set of results can be found in Results Review.   Recommendations: Advised to take 2 tablets (40 mg total) of Furosemide 20 mg x 3 days only then return to 1 tablet daily.    Copy sent to Gypsy Balsam, NP as Lorain Childes.     Follow-up plan: ICM clinic phone appointment on 02/14/2024 to recheck fluid levels.   91 day device clinic remote transmission 05/10/2024.      EP/Cardiology Office Visits:  Recall 05/29/2024 with Dr Nelly Laurence.     Copy of ICM check sent to Dr. Nelly Laurence.    3 month ICM trend:  02/14/2024.    12-14 Month ICM trend:     Karie Soda, RN 02/15/2024 1:26 PM

## 2024-02-21 ENCOUNTER — Ambulatory Visit: Attending: Cardiovascular Disease

## 2024-02-21 DIAGNOSIS — I5042 Chronic combined systolic (congestive) and diastolic (congestive) heart failure: Secondary | ICD-10-CM

## 2024-02-21 DIAGNOSIS — Z95810 Presence of automatic (implantable) cardiac defibrillator: Secondary | ICD-10-CM

## 2024-02-22 ENCOUNTER — Telehealth: Payer: Self-pay

## 2024-02-22 ENCOUNTER — Ambulatory Visit (HOSPITAL_COMMUNITY)
Admission: RE | Admit: 2024-02-22 | Discharge: 2024-02-22 | Disposition: A | Discharge status: Home / Self Care | Payer: Medicare PPO | Source: Ambulatory Visit | Attending: Radiation Oncology | Admitting: Radiation Oncology

## 2024-02-22 DIAGNOSIS — C7931 Secondary malignant neoplasm of brain: Secondary | ICD-10-CM | POA: Diagnosis present

## 2024-02-22 MED ORDER — GADOBUTROL 1 MMOL/ML IV SOLN
7.0000 mL | Freq: Once | INTRAVENOUS | Status: AC | PRN
  Administered 2024-02-22: 7 mL via INTRAVENOUS

## 2024-02-22 NOTE — Telephone Encounter (Signed)
 Remote ICM transmission received.  Attempted call to patient regarding ICM remote transmission and no answer.

## 2024-02-22 NOTE — Progress Notes (Signed)
  Device system confirmed to be MRI conditional, with implant date > 6 weeks ago, and no evidence of abandoned or epicardial leads in review of most recent CXR  Device last cleared by EP Provider:   Canary Brim NP  Clearance is good through for 1 year as long as parameters remain stable at time of check. If pt undergoes a cardiac device procedure during that time, they should be re-cleared.   Tachy-therapies to be programmed off if applicable with device back to pre-MRI settings after completion of exam.  Medtronic - Programming recommendation received through Medtronic App/Tablet  Terri Piedra  02/22/2024 2:00 PM

## 2024-02-22 NOTE — Progress Notes (Signed)
 EPIC Encounter for ICM Monitoring  Patient Name: Juan Norris is a 73 y.o. male Date: 02/22/2024 Primary Care Physican: [facility] Primary Cardiologist: Gypsy Balsam, NP Electrophysiologist: Mealor Bi-V Pacing:   99.5%       02/22/2023 Weight: 151 lbs 06/11/2023 Weight: 150-151 lbs 08/20/2023 Weight: 150 lbs 02/01/2024 Weight: 152 lbs                                                           Attempted call to patient and unable to reach.  Left detailed message per DPR regarding transmission.  Transmission results reviewed.     Diet: 12/31/2023 He does not limit fluid or salt.  Appetite is good despite lung cancer treatments.    Optivol Thoracic impedance continues suggesting slight improvement for the 2 days he took extra Furosemide 20 mg but still suggesting ongoing possible fluid accumulation starting 1/19.  Fluid index greater than normal starting 1/29.   Prescribed:  Furosemide 20 mg take 1 tablet by mouth daily.  May take 1 additional tablet by mouth daily only as needed for weight gain of 2 lbs in 24 hours or 5 lbs in a week.  Spironolactone 25 mg take 0.5 tablet (12.5 mg total) daily   Labs: 02/03/2024 Creatinine 0.93, BUN 12, Potassium 4.9, Sodium 145, GFR 87 01/04/2024 Creatinine 1.04, BUN 10, Potassium 4.8, Sodium 137, GFR 76 12/27/2023 Creatinine 1.10, BUN 28, Potassium 5.5, Sodium 139  12/03/2023 Creatinine 1.15, BUN 28, Potassium 4.6, Sodium 135, GFR >60  11/15/2023 Creatinine 1.07, BUN 18, Potassium 4.2, Sodium 138, GFR >60  A complete set of results can be found in Results Review.   Recommendations:  Unable to reach.  Will send to Gypsy Balsam, NP for review if patient is reached.   Follow-up plan: ICM clinic phone appointment on 03/06/2024.   91 day device clinic remote transmission 05/10/2024.      EP/Cardiology Office Visits:  Recall 05/29/2024 with Dr Nelly Laurence.     Copy of ICM check sent to Dr. Nelly Laurence.   3 month ICM trend: 02/21/2024.    12-14 Month ICM  trend:     Karie Soda, RN 02/22/2024 8:41 AM

## 2024-02-23 ENCOUNTER — Ambulatory Visit (HOSPITAL_COMMUNITY)
Admission: RE | Admit: 2024-02-23 | Discharge: 2024-02-23 | Disposition: A | Discharge status: Home / Self Care | Source: Ambulatory Visit | Attending: Internal Medicine | Admitting: Internal Medicine

## 2024-02-23 ENCOUNTER — Encounter (HOSPITAL_COMMUNITY): Payer: Self-pay

## 2024-02-23 DIAGNOSIS — C7931 Secondary malignant neoplasm of brain: Secondary | ICD-10-CM | POA: Insufficient documentation

## 2024-02-23 DIAGNOSIS — I700 Atherosclerosis of aorta: Secondary | ICD-10-CM | POA: Diagnosis not present

## 2024-02-23 DIAGNOSIS — C3490 Malignant neoplasm of unspecified part of unspecified bronchus or lung: Secondary | ICD-10-CM | POA: Insufficient documentation

## 2024-02-23 MED ORDER — IOHEXOL 300 MG/ML  SOLN
75.0000 mL | Freq: Once | INTRAMUSCULAR | Status: AC | PRN
  Administered 2024-02-23: 75 mL via INTRAVENOUS

## 2024-02-28 ENCOUNTER — Inpatient Hospital Stay: Payer: Medicare PPO | Attending: Nurse Practitioner

## 2024-02-29 ENCOUNTER — Ambulatory Visit
Admission: RE | Admit: 2024-02-29 | Discharge: 2024-02-29 | Disposition: A | Discharge status: Home / Self Care | Payer: TRICARE For Life (TFL) | Source: Ambulatory Visit | Attending: Radiation Oncology | Admitting: Radiation Oncology

## 2024-02-29 NOTE — Progress Notes (Signed)
  Radiation Oncology         [phone] ________________________________  Name: Juan Norris MRN: [id number]  Date of Service: 02/29/2024  DOB: 06-22-1951  Post Treatment Telephone Note  Diagnosis:  Z85.118 Personal history of other malignant neoplasm of bronchus and lung (as documented in provider EOT note)  The patient was available for call today.   Symptoms of fatigue have improved since completing therapy.  Symptoms of skin changes have improved since completing therapy.  Symptoms of esophagitis have improved since completing therapy.  The patient was encouraged to call if he develops concerns or questions regarding radiation.   This concludes the interaction.  Ruel Favors, LPN

## 2024-02-29 NOTE — Addendum Note (Signed)
 Encounter addended by: Marcello Fennel, PA-C on: 02/29/2024 1:05 PM  Actions taken: Clinical Note Signed

## 2024-03-01 ENCOUNTER — Ambulatory Visit
Admission: RE | Admit: 2024-03-01 | Discharge: 2024-03-01 | Disposition: A | Discharge status: Home / Self Care | Source: Ambulatory Visit | Attending: Urology | Admitting: Urology

## 2024-03-01 ENCOUNTER — Encounter: Payer: Self-pay | Admitting: Urology

## 2024-03-01 ENCOUNTER — Other Ambulatory Visit: Payer: Self-pay | Admitting: Radiation Therapy

## 2024-03-01 VITALS — Ht 66.0 in | Wt 156.0 lb

## 2024-03-01 DIAGNOSIS — C7931 Secondary malignant neoplasm of brain: Secondary | ICD-10-CM

## 2024-03-01 NOTE — Progress Notes (Signed)
 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 Metastatic cancer to brain [facility])  C79.31        HISTORY OF PRESENT ILLNESS: Juan Norris is a 73 y.o. 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 Roselind Messier from 11/03/21 through 11/14/21.  Follow-up CT chest imaging had subsequently remained stable without evidence of progression or recurrence through April 2023 when he last saw Dr. Roselind Messier.  The plan at that time was for a follow-up scan in 6 months but he was lost to follow-up at the cancer center thereafter, apparently being followed with serial CT chest scans with Dr. Delton See, in medical oncology at the [facility].  Reportedly, his scans had remained without evidence of disease recurrence or progression as of his last scan in August 2024.  He presented to the emergency department on 11/12/2023 with complaints of diplopia and dizziness for 2 days.  A CT angio head and neck was performed on admission and showed masses in the left occipital lobe and right posterior fossa with significant edema and mass effect with effacement of the fourth ventricle.  Also noted was new nodularity and pleural thickening in the left upper lobe lung.  He was started on dexamethasone 4 mg p.o. 3 times daily and has had resolution of the diplopia and dizziness since starting the  steroids.  A CT C/A/P was performed on 11/13/2023 and showed a stable, smaller appearance of the treated RLL nodule but a new 1.3 cm LUL nodule and a 5 mm RUL nodule.  Additionally, a 2 cm enhancing liver lesion was noted.  A CT head showed a 2.6 cm peripherally enhancing mass in the left occipital cortex and a 3.5 cm enhancing mass in the inferior right cerebellum.  MRI abdomen was performed on 11/15/2023 for further evaluation of the liver lesion and suggests this lesion is incidental, benign focal nodular hyperplasia rather than a metastasis.    An MRI brain scan on 11/15/2023 confirmed peripherally enhancing, partially cystic masses in the left occipital lobe and right cerebellum, consistent with metastatic disease. The right cerebellar mass was causing associated mass effect on the fourth ventricle, right pons, and right medulla. No hydrocephalus. Dr. Yetta Barre, of neurosurgery, was consulted and recommended fractionated stereotactic radiosurgery as opposed to surgical resection. He subsequently completed 3 fractions of SRS to the brain under the care of Dr. Kathrynn Running on 11/19/23, 11/22/23, and 11/24/23.   Subsequently, he underwent restaging PET scan on 11/29/23 showing an 11 mm subpleural nodule in the lateral LUL, suspicious for recurrence/metastasis vs primary bronchogenic carcinoma as well as a 4 mm subpleural nodule in RUL, beneath size threshold for PET sensitivity. The hepatic lesions were non-FDG-avid, favoring suspected benign etiology. He was referred back to pulmonology and proceeded with bronchoscopy with biopsies on 12/27/23 under the care of  Dr. Delton Coombes. Cytology from both samples taken from the LUL lung nodule confirmed NSCLC, adenocarcinoma.  We saw him back in the clinic on 01/07/24 and he elected to proceed with the recommended 3-5 fraction course of stereotactic body radiotherapy (SBRT) to the new, left upper lobe lung nodule; completed 01/27/24.   He tolerated the treatments well and remains without  complaints regarding the chest and brain radiation. I spoke with the patient to conduct his routine scheduled 3 follow up visit to review results of his recent brain MRI scan via telephone to spare the patient unnecessary potential exposure in the healthcare setting during the current COVID-19 pandemic.  The patient was notified in advance and gave permission to proceed with this visit format.  He reports that he is doing well in general and specifically denies any headaches, blurry vision, imbalance, N/V or focal weakness. He continues his routine follow up with Dr. Mathis Bud in medical oncology at the [facility] for continued management of his systemic disease and had a restaging CT Chest on 02/23/24 that showed a stable appearance of the treated lung nodules and no evidence of disease progression or recurrence.  Fortunately, the MRI brain scan from 02/22/24 shows an excellent response to treatment with decreased size of both treated lesions in the right cerebellum and left occipital lobe. The surrounding edema and posterior fossa mass effect have also decreased and there are no new intracranial metastases identified. We reviewed these results today.  PREVIOUS RADIATION THERAPY: Yes  01/20/24 - 01/27/24: The LUL lung nodule was treated to 54 Gy in 3 fractions of 18 Gy each.  11/19/23, 11/22/23, 11/24/23: Brain SRS / 27 Gy in 3 fractions  11/03/2021 through 11/14/2021 Site Technique Total Dose (Gy) Dose per Fx (Gy) Completed Fx Beam Energies  Lung, Right: Lung_Rt IMRT 60/60 12 5/5 6XFFF     PAST MEDICAL HISTORY:  Past Medical History:  Diagnosis Date   Acute renal insufficiency    AICD (automatic cardioverter/defibrillator) present    Anginal pain (HCC)    CHF (congestive heart failure) (HCC)    Chronic lower back pain    "due to 3 ruptured discs in my back" (11/08/2018)   High cholesterol    High cholesterol    History of kidney stones 1982   "passed them"   History of radiation therapy    Right lung-  11/03/21-11/14/21- Dr. Joylene Draft   Hypertension    Lung cancer [facility])    Myocardial infarction (HCC) 03/2018   Systolic heart failure (HCC)    Tobacco use       PAST SURGICAL HISTORY: Past Surgical History:  Procedure Laterality Date   BIV ICD INSERTION CRT-D N/A 11/08/2018    Medtronic biventricular ICD implantation by Dr Johney Frame with His Bundle pacing and a MDT 3830 lead placed into the LV port.    BRONCHIAL BIOPSY  10/13/2021   Procedure: BRONCHIAL BIOPSIES;  Surgeon: Leslye Peer, MD;  Location: [facility] ENDOSCOPY;  Service: Pulmonary;;   BRONCHIAL BIOPSY  12/27/2023   Procedure: BRONCHIAL BIOPSIES;  Surgeon: Leslye Peer, MD;  Location: [facility] ENDOSCOPY;  Service: Pulmonary;;   BRONCHIAL BRUSHINGS  10/13/2021   Procedure: BRONCHIAL BRUSHINGS;  Surgeon: Leslye Peer, MD;  Location: [facility] ENDOSCOPY;  Service: Pulmonary;;   BRONCHIAL BRUSHINGS  12/27/2023   Procedure: BRONCHIAL BRUSHINGS;  Surgeon: Leslye Peer, MD;  Location: [facility] ENDOSCOPY;  Service: Pulmonary;;   BRONCHIAL NEEDLE ASPIRATION BIOPSY  10/13/2021   Procedure: BRONCHIAL NEEDLE ASPIRATION BIOPSIES;  Surgeon: Leslye Peer,  MD;  Location: [facility] ENDOSCOPY;  Service: Pulmonary;;   BRONCHIAL NEEDLE ASPIRATION BIOPSY  12/27/2023   Procedure: BRONCHIAL NEEDLE ASPIRATION BIOPSIES;  Surgeon: Leslye Peer, MD;  Location: [facility] ENDOSCOPY;  Service: Pulmonary;;   BRONCHIAL WASHINGS  10/13/2021   Procedure: BRONCHIAL WASHINGS;  Surgeon: Leslye Peer, MD;  Location: [facility] ENDOSCOPY;  Service: Pulmonary;;   CARDIAC CATHETERIZATION     FIDUCIAL MARKER PLACEMENT  10/13/2021   Procedure: FIDUCIAL MARKER PLACEMENT;  Surgeon: Leslye Peer, MD;  Location: [facility] ENDOSCOPY;  Service: Pulmonary;;   FIDUCIAL MARKER PLACEMENT  12/27/2023   Procedure: FIDUCIAL MARKER PLACEMENT;  Surgeon: Leslye Peer, MD;  Location: [facility] ENDOSCOPY;  Service: Pulmonary;;   INGUINAL HERNIA REPAIR Bilateral    IR IMAGING GUIDED PORT INSERTION  11/15/2023   KNEE SURGERY  Left 1990s   "growth on the back of my knee removed"   LEFT HEART CATH AND CORONARY ANGIOGRAPHY N/A 03/30/2018   Procedure: LEFT HEART CATH AND CORONARY ANGIOGRAPHY;  Surgeon: Kathleene Hazel, MD;  Location: [facility] INVASIVE CV LAB;  Service: Cardiovascular;  Laterality: N/A;   VIDEO BRONCHOSCOPY WITH ENDOBRONCHIAL NAVIGATION N/A 10/13/2021   Procedure: ROBOTIC VIDEO BRONCHOSCOPY WITH ENDOBRONCHIAL NAVIGATION;  Surgeon: Leslye Peer, MD;  Location: [facility] ENDOSCOPY;  Service: Pulmonary;  Laterality: N/A;   VIDEO BRONCHOSCOPY WITH RADIAL ENDOBRONCHIAL ULTRASOUND  10/13/2021   Procedure: RADIAL ENDOBRONCHIAL ULTRASOUND;  Surgeon: Leslye Peer, MD;  Location: [facility] ENDOSCOPY;  Service: Pulmonary;;    FAMILY HISTORY:  Family History  Problem Relation Age of Onset   Leukemia Mother    Cancer Father    Cancer Sister    Cancer Brother    CAD Brother     SOCIAL HISTORY:  Social History   Socioeconomic History   Marital status: Divorced    Spouse name: Not on file   Number of children: 1   Years of education: Not on file   Highest education level: Not on file  Occupational History   Not on file  Tobacco Use   Smoking status: Former    Current packs/day: 1.50    Average packs/day: 1.5 packs/day for 47.0 years (70.5 ttl pk-yrs)    Types: Cigarettes   Smokeless tobacco: Former   Tobacco comments:    1 cigarette smoked daily 05/28/22 [facility]   Vaping Use   Vaping status: Never Used  Substance and Sexual Activity   Alcohol use: Not Currently    Comment: 11/08/2018 "stopped 09/23/1998"   Drug use: Not Currently    Comment: 11/08/2018 "nothing in the 2000s"   Sexual activity: Yes  Other Topics Concern   Not on file  Social History Narrative   Retired   [employer] Strain: Not on file  Food Insecurity: No Food Insecurity (01/07/2024)   Hunger Vital Sign    Worried About Running Out of Food in the Last Year: Never true    Ran Out of Food in the  Last Year: Never true  Transportation Needs: No Transportation Needs (01/07/2024)   PRAPARE - Administrator, Civil Service (Medical): No    Lack of Transportation (Non-Medical): No  Physical Activity: Not on file  Stress: Not on file  Social Connections: Unknown (03/02/2023)   Received from [facility]   Social Network    Social Network: Not on file  Intimate Partner Violence: Not At Risk (01/07/2024)   Humiliation, Afraid, Rape, and Kick questionnaire    Fear of Current or Ex-Partner:  No    Emotionally Abused: No    Physically Abused: No    Sexually Abused: No    ALLERGIES: Patient has no known allergies.  MEDICATIONS:  Current Outpatient Medications  Medication Sig Dispense Refill   aspirin 81 MG EC tablet Take 1 tablet (81 mg total) by mouth daily. 30 tablet 6   atorvastatin (LIPITOR) 40 MG tablet Take 1 tablet (40 mg total) by mouth at bedtime.     carvedilol (COREG) 25 MG tablet Take 12.5 mg by mouth daily.     furosemide (LASIX) 20 MG tablet Take 1 tablet (20 mg total) by mouth daily. As directed may take 1 additional tablet by mouth daily only as needed for weight gain of 2 lbs in 24 hours or 5 lbs in a week 120 tablet 3   Multiple Vitamin (MULTIVITAMIN WITH MINERALS) TABS tablet Take 1 tablet by mouth daily.     nicotine (NICODERM CQ - DOSED IN MG/24 HOURS) 21 mg/24hr patch Place 21 mg onto the skin daily. Last patch was applied 3-4 days ago, no patch is on today 11/12/2023.     sacubitril-valsartan (ENTRESTO) 24-26 MG Take 1 tablet by mouth 2 (two) times daily.     spironolactone (ALDACTONE) 25 MG tablet Take 0.5 tablets (12.5 mg total) by mouth daily. 15 tablet 5   No current facility-administered medications for this encounter.    REVIEW OF SYSTEMS:  On review of systems, the patient reports that he is doing well overall. He denies any chest pain, shortness of breath, cough, fevers, chills, night sweats, unintended weight changes. He denies any bowel or  bladder disturbances, and denies abdominal pain, nausea or vomiting. He denies any new musculoskeletal or joint aches or pains. A complete review of systems is obtained and is otherwise negative.    PHYSICAL EXAM:  Wt Readings from Last 3 Encounters:  02/03/24 157 lb (71.2 kg)  01/07/24 153 lb 2 oz (69.5 kg)  12/27/23 140 lb (63.5 kg)   Temp Readings from Last 3 Encounters:  01/07/24 (!) 97.1 F (36.2 C) (Temporal)  12/27/23 97.6 F (36.4 C)  12/03/23 98 F (36.7 C) (Temporal)   BP Readings from Last 3 Encounters:  02/03/24 (!) 108/50  01/07/24 (!) 130/50  12/27/23 (!) 123/58   Pulse Readings from Last 3 Encounters:  02/03/24 76  01/07/24 79  12/27/23 75    /10  Unable to assess due to telephone follow up visit format.     KPS = 100  100 - Normal; no complaints; no evidence of disease. 90   - Able to carry on normal activity; minor signs or symptoms of disease. 80   - Normal activity with effort; some signs or symptoms of disease. 46   - Cares for self; unable to carry on normal activity or to do active work. 60   - Requires occasional assistance, but is able to care for most of his personal needs. 50   - Requires considerable assistance and frequent medical care. 40   - Disabled; requires special care and assistance. 30   - Severely disabled; hospital admission is indicated although death not imminent. 20   - Very sick; hospital admission necessary; active supportive treatment necessary. 10   - Moribund; fatal processes progressing rapidly. 0     - Dead  Karnofsky DA, Abelmann WH, Craver LS and Burchenal [facility] [phone]) The use of the nitrogen mustards in the palliative treatment of carcinoma: with particular reference to bronchogenic carcinoma Cancer 1 634-56  LABORATORY DATA:  Lab Results  Component Value Date   WBC 15.3 (H) 12/03/2023   HGB 14.3 12/27/2023   HCT 42.0 12/27/2023   MCV 88.0 12/03/2023   PLT 117 (L) 12/03/2023   Lab Results  Component Value Date    NA 145 (H) 02/03/2024   K 4.9 02/03/2024   CL 106 02/03/2024   CO2 25 02/03/2024   Lab Results  Component Value Date   ALT 23 12/03/2023   AST 15 12/03/2023   ALKPHOS 115 12/03/2023   BILITOT 0.7 12/03/2023     RADIOGRAPHY: CT CHEST W CONTRAST Result Date: 02/25/2024 CLINICAL DATA:  Lung cancer. EXAM: CT CHEST WITH CONTRAST TECHNIQUE: Multidetector CT imaging of the chest was performed during intravenous contrast administration. RADIATION DOSE REDUCTION: This exam was performed according to the departmental dose-optimization program which includes automated exposure control, adjustment of the mA and/or kV according to patient size and/or use of iterative reconstruction technique. CONTRAST:  75mL OMNIPAQUE IOHEXOL 300 MG/ML  SOLN COMPARISON:  None Available. FINDINGS: Cardiovascular: Cardiomegaly. Atheromatous calcifications aorta and coronary arteries. Left-sided pacer in place. Mediastinum/Nodes: Unremarkable thyroid, esophagus and tracheobronchial tree. Right paratracheal node short axis 1 cm. AP window adenopathy with a 9 mm node. These are stable findings. Lungs/Pleura: Bullous emphysematous disease especially the upper lobes. Bibasilar linear sub some atelectasis or scarring. Left upper lobe nodular lesion measuring 1.4 x 0.9 cm unchanged. Stable 5 mm nodule right upper lobe. Upper Abdomen: No acute abnormality. Musculoskeletal: No acute osseous abnormalities. There is bilateral gynecomastia. IMPRESSION: 1. Stable bilateral upper lobe nodules. 2. Bullous emphysematous disease. 3. Aortic and coronary artery atherosclerosis. 4. Gynecomastia. Electronically Signed   By: Layla Maw M.D.   On: 02/25/2024 22:09   MR Brain W Wo Contrast Result Date: 02/22/2024 CLINICAL DATA:  Provided history: Metastasis to brain. Brain metastases, assess treatment response. EXAM: MRI HEAD WITHOUT AND WITH CONTRAST TECHNIQUE: Multiplanar, multiecho pulse sequences of the brain and surrounding structures were  obtained without and with intravenous contrast. CONTRAST:  7mL GADAVIST GADOBUTROL 1 MMOL/ML IV SOLN COMPARISON:  Brain MRI 11/15/2023. FINDINGS: Brain: No age-advanced or lobar predominant cerebral atrophy. A peripherally enhancing lesion within the left occipital lobe has decreased in size since the MRI of 11/15/2023, now 1.4 x 1.2 x 1.7 cm (series 16, image 68) (series 17, image 6). This lesion previously measured 2.6 x 2.6 x 2.1 cm (remeasured on prior). Surrounding edema has slightly decreased. A peripherally enhancing lesion within the right cerebellar hemisphere has also decreased in size, now measuring 2.2 x 1.8 x 1.4 cm (series 16, image 37) (series 17, image 18). This lesion previously measured 3.3 x 2.7 x 2.3 cm (remeasured on prior). Surrounding edema is similar. Previously demonstrated partial effacement of the fourth ventricle has near completely resolved. Susceptibility-weighted signal loss associated with both of the above described lesions, if which may reflect non-acute blood products and/or mineralization. No new intracranial metastasis identified. Mild multifocal T2 FLAIR hyperintense signal abnormality elsewhere the cerebral white matter, nonspecific but compatible with chronic small vessel ischemic disease. Punctate chronic microhemorrhage within the left parietal lobe, new from the prior MRI (series 12, image 36). 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 There is no acute infarct. No extra-axial fluid collection. No midline shift. Vascular: Maintained flow voids within the proximal large arterial vessels. Skull and upper cervical spine: No focal worrisome marrow lesion. Sinuses/Orbits: No mass or acute finding within the imaged orbits. Mild mucosal thickening within the right maxillary sinus.  Mucous retention cysts and/or polyps measuring up to 12 mm, and mild background mucosal thickening, within the left maxillary sinus. IMPRESSION: 1. Two metastases  within the right cerebellar hemisphere and left occipital lobe have decreased in size since the MRI of 11/15/2023. Edema surrounding these lesions has remained stable or decreased. Decreased posterior fossa mass effect related to the right cerebellar lesion. 2. No new intracranial metastasis identified. Electronically Signed   By: Jackey Loge D.O.   On: 02/22/2024 16:04   CUP PACEART REMOTE DEVICE CHECK Result Date: 02/10/2024 Scheduled remote reviewed. Normal device function.  3 total classified NSVT events, V>A, 1 sec, V-rates  200-230 bpm  Next remote 91 days. ML, CVRS     IMPRESSION/PLAN: 1. 73 y.o. 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  His recent MRI brain scan from 02/22/2024 shows an excellent response to treatment with a decrease in size of the 2 treated brain lesions in the right cerebellum and left occipital lobe and no new metastases identified.  He also had a recent CT chest scan on 02/23/2024 that shows disease stability of the treated lung lesions and no new or concerning findings.  He will follow-up with Dr. Mathis Bud at the [facility] next week to review those results and we will plan to continue in routine follow-up under her care regarding the lung cancer.  We will continue to monitor the brain disease closely, with serial MRI brain scans every 3 months to rule out any evidence of new or recurrent disease in the brain.  We reviewed these recommendations today and he is comfortable and in agreement with the stated plan.  I will plan to connect with him by telephone following each MRI brain scan to review the results and recommendations.  He knows that he is welcome to call at anytime in the interim with any questions or concerns related to his previous  radiation treatments.  I personally spent 30 minutes in this encounter including chart review, reviewing radiological studies, telephone conversation with the patient, entering orders, coordinating care and completing documentation.   Marguarite Arbour, MMS, PA-C [place]  [facility] Radiation Oncology Physician Assistant Direct Dial: [phone]  Fax: [phone]

## 2024-03-01 NOTE — Progress Notes (Signed)
 Telephone nursing appointment for review of most recent MRI results. I verified patient's identity x2 and began nursing interview.   Patient reports doing well. Patient denies any related issues at this time.   Meaningful use complete.   Patient aware of their 10:30am 03/01/24 telephone appointment w/ Ashlyn Bruning PA-C. I left my extension [phone] in case patient needs anything. Patient verbalized understanding. This concludes the nursing interview.   Patient contact [phone]     Ruel Favors, LPN

## 2024-03-06 ENCOUNTER — Ambulatory Visit: Attending: Cardiovascular Disease

## 2024-03-06 DIAGNOSIS — Z95810 Presence of automatic (implantable) cardiac defibrillator: Secondary | ICD-10-CM

## 2024-03-06 DIAGNOSIS — I5042 Chronic combined systolic (congestive) and diastolic (congestive) heart failure: Secondary | ICD-10-CM

## 2024-03-07 NOTE — Progress Notes (Signed)
 EPIC Encounter for ICM Monitoring  Patient Name: Juan Norris is a 73 y.o. male Date: 03/07/2024 Primary Care Physican: [facility] Primary Cardiologist: Gypsy Balsam, NP Electrophysiologist: Mealor Bi-V Pacing:   99.5%       02/22/2023 Weight: 151 lbs 06/11/2023 Weight: 150-151 lbs 08/20/2023 Weight: 150 lbs 02/01/2024 Weight: 152 lbs  03/07/2024 Weight: 151 lbs                                                          Spoke with patient and heart failure questions reviewed.  Transmission results reviewed.  Pt continues to be asymptomatic for fluid accumulation for the past 2 months.       Diet: 12/31/2023 He does not limit fluid or salt.  Appetite is good despite lung cancer treatments.    Optivol Thoracic impedance continues suggesting slight improvement for the 2 days he took extra Furosemide 20 mg but still suggesting ongoing possible fluid accumulation starting 1/19.  Fluid index greater than normal starting 1/29.   Prescribed:  Furosemide 20 mg take 1 tablet by mouth daily.  May take 1 additional tablet by mouth daily only as needed for weight gain of 2 lbs in 24 hours or 5 lbs in a week.  Spironolactone 25 mg take 0.5 tablet (12.5 mg total) daily   Labs: 02/03/2024 Creatinine 0.93, BUN 12, Potassium 4.9, Sodium 145, GFR 87 01/04/2024 Creatinine 1.04, BUN 10, Potassium 4.8, Sodium 137, GFR 76 12/27/2023 Creatinine 1.10, BUN 28, Potassium 5.5, Sodium 139  12/03/2023 Creatinine 1.15, BUN 28, Potassium 4.6, Sodium 135, GFR >60  11/15/2023 Creatinine 1.07, BUN 18, Potassium 4.2, Sodium 138, GFR >60  A complete set of results can be found in Results Review.   Recommendations:  Advised patient to call if he develops fluid symptoms and will return to monthly fluid checks unless patient becomes symptomatic.   Follow-up plan: ICM clinic phone appointment on 04/10/2024.   91 day device clinic remote transmission 05/10/2024.      EP/Cardiology Office Visits:  Recall 05/29/2024 with Dr  Nelly Laurence.  Copy sent to Gypsy Balsam, NP as Lorain Childes.   Copy of ICM check sent to Dr. Nelly Laurence.   3 month ICM trend: 03/06/2024.    12-14 Month ICM trend:     Karie Soda, RN 03/07/2024 8:44 AM

## 2024-03-13 ENCOUNTER — Encounter

## 2024-03-14 ENCOUNTER — Encounter (HOSPITAL_COMMUNITY): Payer: Self-pay | Admitting: Internal Medicine

## 2024-03-14 DIAGNOSIS — C3490 Malignant neoplasm of unspecified part of unspecified bronchus or lung: Secondary | ICD-10-CM

## 2024-03-15 NOTE — Addendum Note (Signed)
 Addended by: Elease Etienne A on: 03/15/2024 10:03 AM   Modules accepted: Orders

## 2024-04-10 ENCOUNTER — Ambulatory Visit: Attending: Cardiovascular Disease

## 2024-04-10 DIAGNOSIS — Z95810 Presence of automatic (implantable) cardiac defibrillator: Secondary | ICD-10-CM

## 2024-04-10 DIAGNOSIS — I5042 Chronic combined systolic (congestive) and diastolic (congestive) heart failure: Secondary | ICD-10-CM

## 2024-04-14 NOTE — Progress Notes (Signed)
 EPIC Encounter for ICM Monitoring  Patient Name: Juan Norris is a 73 y.o. male Date: 04/14/2024 Primary Care Physican: [name] Primary Cardiologist: Gracelyn Laurence, NP Electrophysiologist: Mealor Bi-V Pacing:   99.4%       02/22/2023 Weight: 151 lbs 06/11/2023 Weight: 150-151 lbs 08/20/2023 Weight: 150 lbs 02/01/2024 Weight: 152 lbs  03/07/2024 Weight: 151 lbs                                                          Transmission results reviewed.         Diet: 12/31/2023 He does not limit fluid or salt.  Appetite is good despite lung cancer treatments.    Optivol Thoracic impedance continues suggesting fluid levels have returned close to normal.  Fluid index decreasing close to normal threshold.   Prescribed:  Furosemide  20 mg take 1 tablet by mouth daily.  May take 1 additional tablet by mouth daily only as needed for weight gain of 2 lbs in 24 hours or 5 lbs in a week.  Spironolactone  25 mg take 0.5 tablet (12.5 mg total) daily   Labs: 02/03/2024 Creatinine 0.93, BUN 12, Potassium 4.9, Sodium 145, GFR 87 01/04/2024 Creatinine 1.04, BUN 10, Potassium 4.8, Sodium 137, GFR 76 12/27/2023 Creatinine 1.10, BUN 28, Potassium 5.5, Sodium 139  12/03/2023 Creatinine 1.15, BUN 28, Potassium 4.6, Sodium 135, GFR >60  11/15/2023 Creatinine 1.07, BUN 18, Potassium 4.2, Sodium 138, GFR >60  A complete set of results can be found in Results Review.   Recommendations:  No changes.   Follow-up plan: ICM clinic phone appointment on 05/15/2024.   91 day device clinic remote transmission 05/10/2024.      EP/Cardiology Office Visits:  Recall 05/29/2024 with Dr Arlester Ladd.     Copy of ICM check sent to Dr. Arlester Ladd.    3 month ICM trend: 04/10/2024.    12-14 Month ICM trend:     Almyra Jain, RN 04/14/2024 3:26 PM

## 2024-05-10 ENCOUNTER — Ambulatory Visit (INDEPENDENT_AMBULATORY_CARE_PROVIDER_SITE_OTHER): Payer: Self-pay

## 2024-05-10 DIAGNOSIS — I428 Other cardiomyopathies: Secondary | ICD-10-CM

## 2024-05-11 LAB — CUP PACEART REMOTE DEVICE CHECK
Battery Remaining Longevity: 11 mo
Battery Voltage: 2.87 V
Brady Statistic AP VP Percent: 17.73 %
Brady Statistic AP VS Percent: 0.01 %
Brady Statistic AS VP Percent: 82.18 %
Brady Statistic AS VS Percent: 0.08 %
Brady Statistic RA Percent Paced: 17.68 %
Brady Statistic RV Percent Paced: 99.53 %
Date Time Interrogation Session: 20250528012203
HighPow Impedance: 62 Ohm
Implantable Lead Connection Status: 753985
Implantable Lead Connection Status: 753985
Implantable Lead Connection Status: 753985
Implantable Lead Implant Date: 20191126
Implantable Lead Implant Date: 20191126
Implantable Lead Implant Date: 20191126
Implantable Lead Location: 753859
Implantable Lead Location: 753860
Implantable Lead Location: 753860
Implantable Lead Model: 3830
Implantable Lead Model: 5076
Implantable Pulse Generator Implant Date: 20191126
Lead Channel Impedance Value: 285 Ohm
Lead Channel Impedance Value: 285 Ohm
Lead Channel Impedance Value: 304 Ohm
Lead Channel Impedance Value: 399 Ohm
Lead Channel Impedance Value: 437 Ohm
Lead Channel Impedance Value: 494 Ohm
Lead Channel Pacing Threshold Amplitude: 0.375 V
Lead Channel Pacing Threshold Amplitude: 0.5 V
Lead Channel Pacing Threshold Amplitude: 0.625 V
Lead Channel Pacing Threshold Pulse Width: 0.4 ms
Lead Channel Pacing Threshold Pulse Width: 0.4 ms
Lead Channel Pacing Threshold Pulse Width: 0.4 ms
Lead Channel Sensing Intrinsic Amplitude: 1.875 mV
Lead Channel Sensing Intrinsic Amplitude: 1.875 mV
Lead Channel Sensing Intrinsic Amplitude: 8.625 mV
Lead Channel Sensing Intrinsic Amplitude: 8.625 mV
Lead Channel Setting Pacing Amplitude: 1.5 V
Lead Channel Setting Pacing Amplitude: 2.5 V
Lead Channel Setting Pacing Amplitude: 2.5 V
Lead Channel Setting Pacing Pulse Width: 0.4 ms
Lead Channel Setting Pacing Pulse Width: 0.4 ms
Lead Channel Setting Sensing Sensitivity: 0.3 mV
Zone Setting Status: 755011
Zone Setting Status: 755011

## 2024-05-12 ENCOUNTER — Ambulatory Visit: Payer: Self-pay | Admitting: Cardiology

## 2024-05-15 ENCOUNTER — Ambulatory Visit: Attending: Cardiovascular Disease

## 2024-05-15 ENCOUNTER — Encounter: Payer: Self-pay | Admitting: Gastroenterology

## 2024-05-15 DIAGNOSIS — Z95810 Presence of automatic (implantable) cardiac defibrillator: Secondary | ICD-10-CM

## 2024-05-15 DIAGNOSIS — I5042 Chronic combined systolic (congestive) and diastolic (congestive) heart failure: Secondary | ICD-10-CM

## 2024-05-17 NOTE — Progress Notes (Unsigned)
 EPIC Encounter for ICM Monitoring  Patient Name: Juan Norris is a 73 y.o. male Date: 05/17/2024 Primary Care Physican: [name] Primary Cardiologist: Gracelyn Laurence, NP Electrophysiologist: Mealor Bi-V Pacing:   99.8%       02/22/2023 Weight: 151 lbs 06/11/2023 Weight: 150-151 lbs 08/20/2023 Weight: 150 lbs 02/01/2024 Weight: 152 lbs  03/07/2024 Weight: 151 lbs 05/18/2024 Weight: 150 lbs                                                          Spoke with patient and heart failure questions reviewed.  Transmission results reviewed.  Pt asymptomatic for fluid accumulation.  He reports making some changes in diet and stopped eating at restaurants.     Diet:  Appetite is good despite lung cancer treatments.    Optivol Thoracic impedance continues suggesting fluid levels returned to normal on 5/22.   Prescribed:  Furosemide  20 mg take 1 tablet by mouth daily.  May take 1 additional tablet by mouth daily only as needed for weight gain of 2 lbs in 24 hours or 5 lbs in a week.  Spironolactone  25 mg take 0.5 tablet (12.5 mg total) daily   Labs: 02/03/2024 Creatinine 0.93, BUN 12, Potassium 4.9, Sodium 145, GFR 87 01/04/2024 Creatinine 1.04, BUN 10, Potassium 4.8, Sodium 137, GFR 76 12/27/2023 Creatinine 1.10, BUN 28, Potassium 5.5, Sodium 139  12/03/2023 Creatinine 1.15, BUN 28, Potassium 4.6, Sodium 135, GFR >60  11/15/2023 Creatinine 1.07, BUN 18, Potassium 4.2, Sodium 138, GFR >60  A complete set of results can be found in Results Review.   Recommendations:  No changes and encouraged to call if experiencing any fluid symptoms.   Follow-up plan: ICM clinic phone appointment on 07/10/2024.   91 day device clinic remote transmission 08/09/2024.      EP/Cardiology Office Visits:  Recall 05/29/2024 with Dr Arlester Ladd.     Copy of ICM check sent to Dr. Arlester Ladd.   3 month ICM trend: 05/15/2024.    12-14 Month ICM trend:     Almyra Jain, RN 05/17/2024 5:16 PM

## 2024-05-18 NOTE — CV Procedure (Signed)
  Device system confirmed to be MRI conditional, with implant date > 6 weeks ago, and no evidence of abandoned or epicardial leads in review of most recent CXR  Device last cleared by EP Provider: 05/17/2024 Sherrilee Doles Riddle  Clearance is good through for 1 year as long as parameters remain stable at time of check. If pt undergoes a cardiac device procedure during that time, they should be re-cleared.   Tachy-therapies to be programmed off if applicable with device back to pre-MRI settings after completion of exam.  [employer] - Programming recommendation received through [employer] App/Tablet  Arlys Lamer, RT  05/18/2024 3:43 PM

## 2024-05-24 ENCOUNTER — Ambulatory Visit (HOSPITAL_COMMUNITY)
Admission: RE | Admit: 2024-05-24 | Discharge: 2024-05-24 | Disposition: A | Discharge status: Home / Self Care | Source: Ambulatory Visit | Attending: Radiation Oncology | Admitting: Radiation Oncology

## 2024-05-24 DIAGNOSIS — C7931 Secondary malignant neoplasm of brain: Secondary | ICD-10-CM | POA: Insufficient documentation

## 2024-05-24 MED ORDER — GADOBUTROL 1 MMOL/ML IV SOLN
7.0000 mL | Freq: Once | INTRAVENOUS | Status: AC | PRN
  Administered 2024-05-24: 7 mL via INTRAVENOUS

## 2024-05-29 ENCOUNTER — Inpatient Hospital Stay

## 2024-05-29 ENCOUNTER — Inpatient Hospital Stay: Attending: Radiation Oncology

## 2024-05-30 ENCOUNTER — Telehealth: Payer: Self-pay | Admitting: Radiation Oncology

## 2024-05-30 ENCOUNTER — Encounter: Payer: Self-pay | Admitting: Urology

## 2024-05-30 NOTE — Progress Notes (Signed)
 Telephone nursing appointment for review of most recent MRI-Brain results. I verified patient's identity x2 and began nursing interview.   Patient states issues as follows...   -Fatigue: Denies -Hair Loss: Denies -Skin: Denies -Weakness: Denies -Loss of control of extremities: Denies -Headache: Denies -Seizure/ uncontrolled movement: Denies -Vision: Denies -Speech: Denies -Confusion: Denies -Dexamethasone / steroids: None  Patient denies any other related issues at this time.   Meaningful use complete.   Patient aware of their telephone appointment w/ Ashlyn Bruning PA-C. I left my extension [phone] in case patient needs anything. Patient verbalized understanding. This concludes the nursing interview.   Patient preferred phone # [phone]  SN: CONFIRMED WITH PATIENT  His last visit with the Medical Oncologist, [name], was 03/07/24. His next chest CT is scheduled on 05/30/24 and to see her again to review those results on 06/06/24.   This concludes the interaction.  Avery Bodo, LPN

## 2024-05-30 NOTE — Telephone Encounter (Signed)
 6/17 Called [facility] spoke to [place], will send over recent [place] [facility] notes as requested.

## 2024-05-31 ENCOUNTER — Ambulatory Visit
Admission: RE | Admit: 2024-05-31 | Discharge: 2024-05-31 | Disposition: A | Discharge status: Home / Self Care | Source: Ambulatory Visit | Attending: Urology | Admitting: Urology

## 2024-05-31 VITALS — Ht 66.0 in | Wt 151.0 lb

## 2024-05-31 DIAGNOSIS — C7931 Secondary malignant neoplasm of brain: Secondary | ICD-10-CM

## 2024-05-31 NOTE — Progress Notes (Signed)
 Pts post treatment notes and recommendations from todays encounter with Keitha Pata, Rad Onc PA faxed to his [place] provider in [place]

## 2024-05-31 NOTE — Progress Notes (Signed)
 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 Metastatic cancer to brain [facility])  C79.31        HISTORY OF PRESENT ILLNESS: Juan Norris is a 73 y.o. 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 from 11/03/21 through 11/14/21.  Follow-up CT chest imaging had subsequently remained stable without evidence of progression or recurrence through April 2023 when he last saw [name].  The plan at that time was for a follow-up scan in 6 months but he was lost to follow-up at the cancer center thereafter, apparently being followed with serial CT chest scans with [name], in medical oncology at the [facility].  Reportedly, his scans had remained without evidence of disease recurrence or progression as of his last scan in August 2024.  He presented to the emergency department on 11/12/2023 with complaints of diplopia and dizziness for 2 days.  A CT angio head and neck was performed on admission and showed masses in the left occipital lobe and right posterior fossa with significant edema and mass effect with effacement of the fourth ventricle.  Also noted was new nodularity and pleural thickening in the left upper lobe lung.  He was started on dexamethasone  4 mg p.o. 3 times daily and has had resolution of the diplopia and dizziness since starting the  steroids.  A CT C/A/P was performed on 11/13/2023 and showed a stable, smaller appearance of the treated RLL nodule but a new 1.3 cm LUL nodule and a 5 mm RUL nodule.  Additionally, a 2 cm enhancing liver lesion was noted.  A CT head showed a 2.6 cm peripherally enhancing mass in the left occipital cortex and a 3.5 cm enhancing mass in the inferior right cerebellum.  MRI abdomen was performed on 11/15/2023 for further evaluation of the liver lesion and suggests this lesion is incidental, benign focal nodular hyperplasia rather than a metastasis.    An MRI brain scan on 11/15/2023 confirmed peripherally enhancing, partially cystic masses in the left occipital lobe and right cerebellum, consistent with metastatic disease. The right cerebellar mass was causing associated mass effect on the fourth ventricle, right pons, and right medulla. No hydrocephalus. [name], of neurosurgery, was consulted and recommended fractionated stereotactic radiosurgery as opposed to surgical resection. He subsequently completed 3 fractions of SRS to the brain under the care of [name] on 11/19/23, 11/22/23, and 11/24/23.   Subsequently, he underwent restaging PET scan on 11/29/23 showing an 11 mm subpleural nodule in the lateral LUL, suspicious for recurrence/metastasis vs primary bronchogenic carcinoma as well as a 4 mm subpleural nodule in RUL, beneath size threshold for PET sensitivity. The hepatic lesions were non-FDG-avid, favoring suspected benign etiology. He was referred back to pulmonology and proceeded with bronchoscopy with biopsies on 12/27/23 under the care of  [name]. Cytology from both samples taken from the LUL lung nodule confirmed NSCLC, adenocarcinoma.  We saw him back in the clinic on 01/07/24 and he elected to proceed with the recommended 3-5 fraction course of stereotactic body radiotherapy (SBRT) to the new, left upper lobe lung nodule; completed 01/27/24.   He tolerated the treatments well and remains without  complaints regarding the chest and brain radiation. I spoke with the patient to conduct his routine scheduled 3 follow up visit to review results of his recent brain MRI scan via telephone to spare the patient unnecessary potential exposure in the healthcare setting during the current COVID-19 pandemic.  The patient was notified in advance and gave permission to proceed with this visit format.  He reports that he is doing well in general and specifically denies any headaches, blurry vision, imbalance, N/V or focal weakness. He continues his routine follow up with [name] in medical oncology at the [facility] for continued management of his systemic disease and had a restaging CT Chest on 05/30/24. He has a scheduled follow up with her on 06/07/24 to review those results and recommendations, currently considering starting on maintenance systemic therapy pending final results from molecular profile.  Fortunately, the recent post-treatment MRI brain scan from 05/24/2024 continues to show a good response to treatment with an overall decrease in size of the 2 treated brain lesions in the right cerebellum and left occipital lobe and no new metastases identified. There was some marginal interval enlargement of the treated right cerebellar lesion as compared to the previous scan in March 2025, felt to be most consistent with treatment effect.  Recommendation is to repeat a short interval MRI brain scan in 2 months for close monitoring.  We reviewed these results today.  PREVIOUS RADIATION THERAPY: Yes  01/20/24 - 01/27/24: The LUL lung nodule was treated to 54 Gy in 3 fractions of 18 Gy each.  11/19/23, 11/22/23, 11/24/23: Brain SRS / 27 Gy in 3 fractions  11/03/2021 through 11/14/2021 Site Technique Total Dose (Gy) Dose per Fx (Gy) Completed Fx Beam Energies  Lung, Right: Lung_Rt IMRT 60/60 12 5/5 6XFFF     PAST MEDICAL HISTORY:  Past Medical History:  Diagnosis Date   Acute renal insufficiency    AICD (automatic  cardioverter/defibrillator) present    Anginal pain (HCC)    CHF (congestive heart failure) (HCC)    Chronic lower back pain    due to 3 ruptured discs in my back (11/08/2018)   High cholesterol    High cholesterol    History of kidney stones 1982   passed them   History of radiation therapy    Right lung- 11/03/21-11/14/21- Dr. Newell Bard   Hypertension    Lung cancer [facility])    Myocardial infarction (HCC) 03/2018   Systolic heart failure (HCC)    Tobacco use       PAST SURGICAL HISTORY: Past Surgical History:  Procedure Laterality Date   BIV ICD INSERTION CRT-D N/A 11/08/2018    Medtronic biventricular ICD implantation by Dr Nunzio Belch with His Bundle pacing and a MDT 3830 lead placed into the LV port.    BRONCHIAL BIOPSY  10/13/2021   Procedure: BRONCHIAL BIOPSIES;  Surgeon: Denson Flake, MD;  Location: [facility] ENDOSCOPY;  Service: Pulmonary;;   BRONCHIAL BIOPSY  12/27/2023   Procedure: BRONCHIAL BIOPSIES;  Surgeon: Denson Flake, MD;  Location: [facility] ENDOSCOPY;  Service: Pulmonary;;   BRONCHIAL BRUSHINGS  10/13/2021   Procedure: BRONCHIAL BRUSHINGS;  Surgeon: Racheal Buddle  S, MD;  Location: [facility] ENDOSCOPY;  Service: Pulmonary;;   BRONCHIAL BRUSHINGS  12/27/2023   Procedure: BRONCHIAL BRUSHINGS;  Surgeon: Denson Flake, MD;  Location: [facility] ENDOSCOPY;  Service: Pulmonary;;   BRONCHIAL NEEDLE ASPIRATION BIOPSY  10/13/2021   Procedure: BRONCHIAL NEEDLE ASPIRATION BIOPSIES;  Surgeon: Denson Flake, MD;  Location: [facility] ENDOSCOPY;  Service: Pulmonary;;   BRONCHIAL NEEDLE ASPIRATION BIOPSY  12/27/2023   Procedure: BRONCHIAL NEEDLE ASPIRATION BIOPSIES;  Surgeon: Denson Flake, MD;  Location: [facility] ENDOSCOPY;  Service: Pulmonary;;   BRONCHIAL WASHINGS  10/13/2021   Procedure: BRONCHIAL WASHINGS;  Surgeon: Denson Flake, MD;  Location: [facility] ENDOSCOPY;  Service: Pulmonary;;   CARDIAC CATHETERIZATION     FIDUCIAL MARKER PLACEMENT  10/13/2021   Procedure: FIDUCIAL MARKER PLACEMENT;  Surgeon:  Denson Flake, MD;  Location: [facility] ENDOSCOPY;  Service: Pulmonary;;   FIDUCIAL MARKER PLACEMENT  12/27/2023   Procedure: FIDUCIAL MARKER PLACEMENT;  Surgeon: Denson Flake, MD;  Location: [facility] ENDOSCOPY;  Service: Pulmonary;;   INGUINAL HERNIA REPAIR Bilateral    IR IMAGING GUIDED PORT INSERTION  11/15/2023   KNEE SURGERY Left 1990s   growth on the back of my knee removed   LEFT HEART CATH AND CORONARY ANGIOGRAPHY N/A 03/30/2018   Procedure: LEFT HEART CATH AND CORONARY ANGIOGRAPHY;  Surgeon: Odie Benne, MD;  Location: [facility] INVASIVE CV LAB;  Service: Cardiovascular;  Laterality: N/A;   VIDEO BRONCHOSCOPY WITH ENDOBRONCHIAL NAVIGATION N/A 10/13/2021   Procedure: ROBOTIC VIDEO BRONCHOSCOPY WITH ENDOBRONCHIAL NAVIGATION;  Surgeon: Denson Flake, MD;  Location: [facility] ENDOSCOPY;  Service: Pulmonary;  Laterality: N/A;   VIDEO BRONCHOSCOPY WITH RADIAL ENDOBRONCHIAL ULTRASOUND  10/13/2021   Procedure: RADIAL ENDOBRONCHIAL ULTRASOUND;  Surgeon: Denson Flake, MD;  Location: [facility] ENDOSCOPY;  Service: Pulmonary;;    FAMILY HISTORY:  Family History  Problem Relation Age of Onset   Leukemia Mother    Cancer Father    Cancer Sister    Cancer Brother    CAD Brother     SOCIAL HISTORY:  Social History   Socioeconomic History   Marital status: Divorced    Spouse name: Not on file   Number of children: 1   Years of education: Not on file   Highest education level: Not on file  Occupational History   Not on file  Tobacco Use   Smoking status: Former    Current packs/day: 1.50    Average packs/day: 1.5 packs/day for 47.0 years (70.5 ttl pk-yrs)    Types: Cigarettes   Smokeless tobacco: Former   Tobacco comments:    1 cigarette smoked daily 05/28/22 [facility]   Vaping Use   Vaping status: Never Used  Substance and Sexual Activity   Alcohol use: Not Currently    Comment: 11/08/2018 stopped 09/23/1998   Drug use: Not Currently    Comment: 11/08/2018 nothing in the 2000s   Sexual  activity: Yes  Other Topics Concern   Not on file  Social History Narrative   Retired   [employer] Strain: Not on file  Food Insecurity: No Food Insecurity (05/30/2024)   Hunger Vital Sign    Worried About Running Out of Food in the Last Year: Never true    Ran Out of Food in the Last Year: Never true  Transportation Needs: Unmet Transportation Needs (05/30/2024)   PRAPARE - Administrator, Civil Service (Medical): Yes    Lack of Transportation (Non-Medical): Yes  Physical Activity: Not on  file  Stress: Not on file  Social Connections: Unknown (03/02/2023)   [facility]   Social Network    Social Network: Not on file  Intimate Partner Violence: Not At Risk (05/30/2024)   Humiliation, Afraid, Rape, and Kick questionnaire    Fear of Current or Ex-Partner: No    Emotionally Abused: No    Physically Abused: No    Sexually Abused: No    ALLERGIES: Patient has no known allergies.  MEDICATIONS:  Current Outpatient Medications  Medication Sig Dispense Refill   aspirin  81 MG EC tablet Take 1 tablet (81 mg total) by mouth daily. 30 tablet 6   atorvastatin  (LIPITOR ) 40 MG tablet Take 1 tablet (40 mg total) by mouth at bedtime.     carvedilol  (COREG ) 25 MG tablet Take 12.5 mg by mouth daily.     furosemide  (LASIX ) 20 MG tablet Take 1 tablet (20 mg total) by mouth daily. As directed may take 1 additional tablet by mouth daily only as needed for weight gain of 2 lbs in 24 hours or 5 lbs in a week 120 tablet 3   Multiple Vitamin (MULTIVITAMIN WITH MINERALS) TABS tablet Take 1 tablet by mouth daily.     sacubitril -valsartan  (ENTRESTO ) 24-26 MG Take 1 tablet by mouth 2 (two) times daily.     spironolactone  (ALDACTONE ) 25 MG tablet Take 0.5 tablets (12.5 mg total) by mouth daily. 15 tablet 5   No current facility-administered medications for this encounter.    REVIEW OF SYSTEMS:  On review of systems, the patient reports that he  is doing well overall. He denies any chest pain, shortness of breath, cough, fevers, chills, night sweats, unintended weight changes. He denies any bowel or bladder disturbances, and denies abdominal pain, nausea or vomiting. He denies any new musculoskeletal or joint aches or pains. A complete review of systems is obtained and is otherwise negative.    PHYSICAL EXAM:  Wt Readings from Last 3 Encounters:  05/30/24 151 lb (68.5 kg)  03/01/24 156 lb (70.8 kg)  02/03/24 157 lb (71.2 kg)   Temp Readings from Last 3 Encounters:  01/07/24 (!) 97.1 F (36.2 C) (Temporal)  12/27/23 97.6 F (36.4 C)  12/03/23 98 F (36.7 C) (Temporal)   BP Readings from Last 3 Encounters:  02/03/24 (!) 108/50  01/07/24 (!) 130/50  12/27/23 (!) 123/58   Pulse Readings from Last 3 Encounters:  02/03/24 76  01/07/24 79  12/27/23 75   Pain Assessment Pain Score: 0-No pain/10  Unable to assess due to telephone follow up visit format.     KPS = 100  100 - Normal; no complaints; no evidence of disease. 90   - Able to carry on normal activity; minor signs or symptoms of disease. 80   - Normal activity with effort; some signs or symptoms of disease. 70   - Cares for self; unable to carry on normal activity or to do active work. 60   - Requires occasional assistance, but is able to care for most of his personal needs. 50   - Requires considerable assistance and frequent medical care. 40   - Disabled; requires special care and assistance. 30   - Severely disabled; hospital admission is indicated although death not imminent. 20   - Very sick; hospital admission necessary; active supportive treatment necessary. 10   - Moribund; fatal processes progressing rapidly. 0     - Dead  Karnofsky DA, Abelmann WH, Craver LS and Burchenal [facility] [phone]) The  use of the nitrogen mustards in the palliative treatment of carcinoma: with particular reference to bronchogenic carcinoma Cancer 1 634-56  LABORATORY DATA:  Lab  Results  Component Value Date   WBC 15.3 (H) 12/03/2023   HGB 14.3 12/27/2023   HCT 42.0 12/27/2023   MCV 88.0 12/03/2023   PLT 117 (L) 12/03/2023   Lab Results  Component Value Date   NA 145 (H) 02/03/2024   K 4.9 02/03/2024   CL 106 02/03/2024   CO2 25 02/03/2024   Lab Results  Component Value Date   ALT 23 12/03/2023   AST 15 12/03/2023   ALKPHOS 115 12/03/2023   BILITOT 0.7 12/03/2023     RADIOGRAPHY: MR Brain W Wo Contrast Result Date: 05/24/2024 CLINICAL DATA:  Brain metastases, assess treatment response 3T SRS Protocol EXAM: MRI HEAD WITHOUT AND WITH CONTRAST TECHNIQUE: Multiplanar, multiecho pulse sequences of the brain and surrounding structures were obtained without and with intravenous contrast. CONTRAST:  7mL GADAVIST  GADOBUTROL  1 MMOL/ML IV SOLN COMPARISON:  MRI brain dated February 22, 2024. FINDINGS: Brain: There has been mild interval enlargement of a peripherally enhancing metastatic lesion within the right cerebellar hemisphere. The lesion has enlarged from approximately 22 x 18 x 14 mm to approximately 21 x 19 x 18 mm. There is increased T2 signal within the adjacent cerebellar hemisphere with mild effacement of the fourth ventricle. There is an additional peripherally enhancing lesion present posterolaterally within the left occipital lobe, which has remained similar in size in the interim, measuring approximately 15 x 12 x 16 mm, as before. The surrounding cerebral edema has also remained unchanged. There are no new or additional lesions present. There is mild to moderate nonspecific cerebral white matter disease present. There is chronic punctate microhemorrhage again demonstrated within the left parietal lobe. Vascular: Normal vascular flow voids. Skull and upper cervical spine: Normal marrow signal. Sinuses/Orbits: Mild mucosal disease within the left maxillary sinus. The orbits are unremarkable. Other: None. IMPRESSION: 1. Marginal interval enlargement of a peripherally  enhancing metastatic lesion within the right cerebellar hemisphere. A peripherally enhancing lesion within the left occipital lobe is unchanged in the interim. 2. Mild to moderate nonspecific cerebral white matter disease. Electronically Signed   By: Maribeth Shivers M.D.   On: 05/24/2024 16:02   CUP PACEART REMOTE DEVICE CHECK Result Date: 05/11/2024 ICD Scheduled remote reviewed. Normal device function.  Presenting rhythm:  AS/BiV pace Next remote 91 days. LA, CVRS     IMPRESSION/PLAN: 1. 73 y.o. 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  His recent MRI brain scan from 05/24/2024 continues to show a good response to treatment with an overall decrease in size of the 2 treated brain lesions in the right cerebellum and left occipital lobe and no new metastases identified. There was some marginal interval enlargement of the treated right cerebellar lesion as compared to the previous scan in March 2025, felt to be most consistent with treatment effect.  Recommendation is to repeat a short interval MRI brain scan in 2 months for close monitoring.  He also had a recent CT chest scan at the [facility] on 05/30/2024 and has a scheduled follow-up visit with his medical oncologist at the [facility], [name], next week to review those results and  will plan to continue in routine follow-up under her care regarding the lung cancer.  We will continue to monitor the brain disease closely, with serial MRI brain scans to rule out any evidence of new or recurrent disease in the brain and I will plan to connect with him by telephone following each scan to review the results and recommendations.  We reviewed these recommendations today and he is comfortable and in agreement with the stated plan.   He  knows that he is welcome to call at anytime in the interim with any questions or concerns related to his previous radiation treatments.  I personally spent 30 minutes in this encounter including chart review, reviewing radiological studies, telephone conversation with the patient, entering orders, coordinating care and completing documentation.   Arta Bihari, MMS, PA-C [place]  [facility] at [facility] Radiation Oncology Physician Assistant Direct Dial: [phone]  Fax: [phone]

## 2024-06-01 ENCOUNTER — Other Ambulatory Visit: Payer: Self-pay | Admitting: Radiation Therapy

## 2024-06-01 DIAGNOSIS — C7931 Secondary malignant neoplasm of brain: Secondary | ICD-10-CM

## 2024-06-02 ENCOUNTER — Telehealth: Payer: Self-pay | Admitting: Radiation Therapy

## 2024-06-02 ENCOUNTER — Inpatient Hospital Stay
Admission: RE | Admit: 2024-06-02 | Discharge: 2024-06-02 | Disposition: A | Discharge status: Home / Self Care | Payer: Self-pay | Source: Ambulatory Visit | Attending: Radiation Oncology | Admitting: Radiation Oncology

## 2024-06-02 ENCOUNTER — Other Ambulatory Visit: Payer: Self-pay

## 2024-06-02 DIAGNOSIS — C3412 Malignant neoplasm of upper lobe, left bronchus or lung: Secondary | ICD-10-CM

## 2024-06-02 NOTE — Telephone Encounter (Signed)
 I spoke with Mr. Juan Norris about his brain MRI and telephone follow-up with Juan Norris in August. He was thankful for the call and has the appointment information.   Axel Bohr R.T.(R)(T) Radiation Special Procedures Lead

## 2024-06-05 ENCOUNTER — Encounter: Payer: Self-pay | Admitting: Internal Medicine

## 2024-06-06 ENCOUNTER — Other Ambulatory Visit (HOSPITAL_COMMUNITY): Payer: Self-pay | Admitting: Internal Medicine

## 2024-06-06 DIAGNOSIS — R911 Solitary pulmonary nodule: Secondary | ICD-10-CM

## 2024-06-12 ENCOUNTER — Telehealth: Payer: Self-pay | Admitting: Pharmacist

## 2024-06-12 NOTE — Progress Notes (Signed)
 Patient was identified as falling into the [place] Measure - Diabetes.   Patient was: Attribution and/or data issue.  Validation/Investigation needed.  Explanation:  PCP at [facility]

## 2024-06-23 ENCOUNTER — Encounter (HOSPITAL_COMMUNITY)
Admission: RE | Admit: 2024-06-23 | Discharge: 2024-06-23 | Disposition: A | Discharge status: Home / Self Care | Source: Ambulatory Visit | Attending: Internal Medicine | Admitting: Internal Medicine

## 2024-06-23 DIAGNOSIS — R911 Solitary pulmonary nodule: Secondary | ICD-10-CM | POA: Insufficient documentation

## 2024-06-23 LAB — GLUCOSE, CAPILLARY: Glucose-Capillary: 93 mg/dL (ref 70–99)

## 2024-06-23 MED ORDER — FLUDEOXYGLUCOSE F - 18 (FDG) INJECTION
7.5500 | Freq: Once | INTRAVENOUS | Status: AC
  Administered 2024-06-23: 7.55 via INTRAVENOUS

## 2024-06-28 ENCOUNTER — Ambulatory Visit: Admitting: Gastroenterology

## 2024-06-28 NOTE — Progress Notes (Deleted)
 SABRA

## 2024-07-03 NOTE — Addendum Note (Signed)
 Addended by: VICCI SELLER A on: 07/03/2024 03:16 PM   Modules accepted: Orders

## 2024-07-03 NOTE — Progress Notes (Signed)
 Remote ICD transmission.

## 2024-07-10 ENCOUNTER — Ambulatory Visit: Attending: Cardiovascular Disease

## 2024-07-10 ENCOUNTER — Telehealth: Payer: Self-pay | Admitting: Acute Care

## 2024-07-10 ENCOUNTER — Other Ambulatory Visit: Payer: Self-pay | Admitting: Acute Care

## 2024-07-10 ENCOUNTER — Encounter: Payer: Self-pay | Admitting: Acute Care

## 2024-07-10 ENCOUNTER — Telehealth: Payer: Self-pay

## 2024-07-10 ENCOUNTER — Ambulatory Visit (INDEPENDENT_AMBULATORY_CARE_PROVIDER_SITE_OTHER): Admitting: Acute Care

## 2024-07-10 ENCOUNTER — Encounter: Payer: Self-pay | Admitting: Emergency Medicine

## 2024-07-10 VITALS — BP 134/62 | HR 62 | Temp 97.7°F | Ht 66.0 in | Wt 150.6 lb

## 2024-07-10 DIAGNOSIS — C3490 Malignant neoplasm of unspecified part of unspecified bronchus or lung: Secondary | ICD-10-CM | POA: Diagnosis not present

## 2024-07-10 DIAGNOSIS — I5042 Chronic combined systolic (congestive) and diastolic (congestive) heart failure: Secondary | ICD-10-CM | POA: Diagnosis not present

## 2024-07-10 DIAGNOSIS — R911 Solitary pulmonary nodule: Secondary | ICD-10-CM

## 2024-07-10 DIAGNOSIS — Z95810 Presence of automatic (implantable) cardiac defibrillator: Secondary | ICD-10-CM

## 2024-07-10 DIAGNOSIS — Z85118 Personal history of other malignant neoplasm of bronchus and lung: Secondary | ICD-10-CM | POA: Diagnosis not present

## 2024-07-10 DIAGNOSIS — Z85841 Personal history of malignant neoplasm of brain: Secondary | ICD-10-CM | POA: Diagnosis not present

## 2024-07-10 DIAGNOSIS — F17200 Nicotine dependence, unspecified, uncomplicated: Secondary | ICD-10-CM

## 2024-07-10 NOTE — Telephone Encounter (Signed)
 Letter given by the nurse case #[id number] sent to [facility] for auth

## 2024-07-10 NOTE — Progress Notes (Signed)
 History of Present Illness Juan Norris is a 73 y.o. male former smoker with additional PMH of CAD with systolic CHF and AICD in place. Also with hypertension and hyperlipidemia. He was referred to Dr. Shelah for evaluation of lung nodule 09/2021.   Synopsis Pt.  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 Shannon from 11/03/21 through 11/14/21.  Follow-up CT chest imaging had subsequently remained stable without evidence of progression or recurrence through April 2023 when he last saw Dr. Shannon.  The plan at that time was for a follow-up scan in 6 months but he was lost to follow-up at the cancer center thereafter, apparently being followed with serial CT chest scans with Dr. Maranda, in medical oncology at the [facility].  Reportedly, his scans had remained without evidence of disease recurrence or progression as of his last scan in August 2024.  He presented to the emergency department on 11/12/2023 with complaints of diplopia and dizziness for 2 days.  A CT angio head and neck was performed on admission and showed masses in the left occipital lobe and right posterior fossa with significant edema and mass effect with effacement of the fourth ventricle.  Also noted was new nodularity and pleural thickening in the left upper lobe lung.  He was started on dexamethasone  4 mg p.o. 3 times daily and has had resolution of the diplopia and dizziness since starting the steroids.  A CT C/A/P was performed on 11/13/2023 and showed a stable, smaller appearance of the treated RLL nodule but a new 1.3 cm LUL nodule and a 5 mm RUL nodule.  Additionally, a 2 cm enhancing liver lesion was noted.  A CT head showed a 2.6 cm peripherally enhancing mass in the left occipital cortex and a 3.5 cm enhancing mass in the inferior right cerebellum.  MRI abdomen was performed on 11/15/2023 for  further evaluation of the liver lesion and suggests this lesion is incidental, benign focal nodular hyperplasia rather than a metastasis.     An MRI brain scan on 11/15/2023 confirmed peripherally enhancing, partially cystic masses in the left occipital lobe and right cerebellum, consistent with metastatic disease. The right cerebellar mass was causing associated mass effect on the fourth ventricle, right pons, and right medulla. No hydrocephalus. Dr. Joshua, of neurosurgery, was consulted and recommended fractionated stereotactic radiosurgery as opposed to surgical resection. He subsequently completed 3 fractions of SRS to the brain under the care of Dr. Patrcia on 11/19/23, 11/22/23, and 11/24/23.    Subsequently, he underwent restaging PET scan on 11/29/23 showing an 11 mm subpleural nodule in the lateral LUL, suspicious for recurrence/metastasis vs primary bronchogenic carcinoma as well as a 4 mm subpleural nodule in RUL, beneath size threshold for PET sensitivity. The hepatic lesions were non-FDG-avid, favoring suspected benign etiology. He was referred back to pulmonology and proceeded with bronchoscopy with biopsies on 12/27/23 under the care of Dr. Shelah. Cytology from both samples taken from the LUL lung nodule confirmed NSCLC, adenocarcinoma.  We saw him back in the clinic on 01/07/24 and he elected to proceed with the recommended 3-5 fraction course of stereotactic body radiotherapy (SBRT) to the new, left upper lobe lung nodule; completed 01/27/24.    He tolerated the treatments well and remains without complaints regarding the chest and brain radiation.    He continues his  routine follow up with Dr. Geneva in medical oncology at the [facility] for continued management of his systemic disease and had a restaging CT Chest on 05/30/24. He has a scheduled follow up with her on 06/07/24 to review those results and recommendations, currently considering starting on maintenance systemic therapy pending final results from  molecular profile.   Recent post-treatment MRI brain scan from 05/24/2024 continues to show a good response to treatment with an overall decrease in size of the 2 treated brain lesions in the right cerebellum and left occipital lobe and no new metastases identified. There was some marginal interval enlargement of the treated right cerebellar lesion as compared to the previous scan in March 2025, felt to be most consistent with treatment effect.  Recommendation is to repeat a short interval MRI brain scan in 2 months for close monitoring.   Pt. Has been followed by the [facility] oncology team also, and has been having surveillance scanning. Repeat PET 06/23/2024 showed Multifocal primary malignancy (adenocarcinoma spectrum) and metastasis are in differential diagnosis. The oncologist from the [facility] has requested an EBUS to further evaluate this finding. He is here today to discuss procedure and to schedule if the patient is in agreement.    07/10/2024 Pt. Presents for follow up at the request of the [facility] oncology team, Dr. Geneva. He understands that his oncology team at the [facility] have requested a bronchoscopy with biopsy and EBUS. He understands this is to further evaluate the right posteromedial paravertebral nodule  As well as other nodules for malignancy. He has been through the bronchoscopy with biopsies in the past, and understands the procedure, and the risks of bleeding, infection, pneumothorax and adverse reaction to anesthesia. We reviewed these again today. He is in agreement with the plan.   He will be scheduled for bronchoscopy with biopsies and EBUS. He will follow up with me 1 week after to review the results and determine next best steps in plan of care. He understands we will need to do a repeat Super D CT Chest for navigational staging, and that he will get a call to get this scheduled.    Test Results: PET scan 06/23/2024 Mediastinal blood pool activity: SUV max 2   Liver activity: SUV max 2.8    NECK: Focal asymmetric subcentimeter metabolic activity of the left Rosenmuller fossa with max SUV 3.7, previously symmetric activity in this region.   Incidental CT findings: Cystic lesion with photopenia in midline sublingual measuring 3 cm, stable to prior likely benign. Heterogeneous thyroid  gland without focal activity. Please note PET-CT is suboptimal for evaluation of brain metastasis. Correlate with dedicated MRI.   CHEST: Left upper lobe subpleural wedge-shaped consolidation measuring 2.5 x 2 cm with max SUV 2.7 previously , measured 2.4 x 1.7 cm with max SUV 3.8.   Posterior right upper lobe nodule measuring 5 mm, max SUV 2.3, stable (7/21).   Sub solid nodule in subpleural posteromedial right lower lobe in right paravertebral measuring 1.6 x 0.9 cm max SUV 1.8, previously same size max SUV 1.7, (image 7/38).   Interval resolution left lower lobe subpleural ground-glass attenuation.   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   No pleural effusion.   The heart size is enlarged. Two lead pacer in place. Atherosclerotic calcifications of coronary arteries. The   Incidental CT findings: Right-sided porta catheter tip terminates in cavoatrial junction. Dual lead pacer in place.   ABDOMEN/PELVIS: Diffuse calcifications of its the spleen suggestive  of prior ligamentous disease. Right kidney simple cortical cysts without FDG uptake. No suspicious focal uptake within the liver. Colonic diverticulosis. Prostatomegaly without focal uptake. Posterior changes along the anterior abdominal wall from prior hernia mesh repair.   Incidental CT findings: Atherosclerotic calcifications of aorta and branches .   SKELETON: No suspicious lesion. Degenerative changes of the spine and bilateral SI joints.   Incidental CT findings: Stable right gluteal lipoma without FDG uptake.   IMPRESSION: Grossly unchanged  left upper lobe subpleural mass/consolidation with mild interval decreased metabolic activity.   Remainder of the pulmonary nodules including the right posteromedial paravertebral nodule are stable to prior. Multifocal primary malignancy (adenocarcinoma spectrum) and metastasis are in differential diagnosis. Recommend follow-up according to oncology protocols.   No new findings to suggest distant metastasis or nodal disease.   Previously seen enhancing liver lesion in segment 4 is not conspicuous on current noncontrast CT and is non FDG avid likely benign.   Stable cystic lesion in midline sublingual cyst without FDG uptake, likely benign.  CT Chest [facility]05/[facility]24 ( [facility]                    Latest Ref Rng & Units 12/27/2023   11:24 AM 12/03/2023   10:52 AM 11/13/2023    4:38 AM  CBC  WBC 4.0 - 10.5 K/uL  15.3  5.4   Hemoglobin 13.0 - 17.0 g/dL 85.6  86.3  87.8   Hematocrit 39.0 - 52.0 % 42.0  38.8  36.3   Platelets 150 - 400 K/uL  117  191        Latest Ref Rng & Units 02/03/2024    1:35 PM 12/27/2023   11:24 AM 12/03/2023   10:52 AM  BMP  Glucose 70 - 99 mg/dL 898  634  645   BUN 8 - 27 mg/dL 12  28  28    Creatinine 0.76 - 1.27 mg/dL 9.06  8.89  8.84   BUN/Creat Ratio 10 - 24 13     Sodium 134 - 144 mmol/L 145  139  135   Potassium 3.5 - 5.2 mmol/L 4.9  5.5  4.6   Chloride 96 - 106 mmol/L 106  105  101   CO2 20 - 29 mmol/L 25   28   Calcium  8.6 - 10.2 mg/dL 9.3   9.1     BNP    Component Value Date/Time   BNP 286.2 (H) 03/29/2018 1620    ProBNP    Component Value Date/Time   PROBNP 444 (H) 11/19/2022 0920   PROBNP 62.8 04/07/2012 2114    PFT    Component Value Date/Time   FEV1PRE 1.94 08/26/2021 1457   FEV1POST 1.71 08/26/2021 1457   FVCPRE 2.82 08/26/2021 1457   FVCPOST 3.08 08/26/2021 1457   TLC 5.82 08/26/2021 1457   DLCOUNC 11.88 08/26/2021 1457   PREFEV1FVCRT 69 08/26/2021 1457   PSTFEV1FVCRT 55 08/26/2021 1457    NM PET Image  Restage (PS) Skull Base to Thigh (F-18 FDG) Result Date: 06/27/2024 CLINICAL DATA:  Subsequent treatment strategy for lung nodule. EXAM: NUCLEAR MEDICINE PET SKULL BASE TO THIGH TECHNIQUE: 7.55 mCi F-18 FDG was injected intravenously. Full-ring PET imaging was performed from the skull base to thigh after the radiotracer. CT data was obtained and used for attenuation correction and anatomic localization. Fasting blood glucose:  93 mg/dl COMPARISON:  Chest CT February 23, 2024, PET-CT November 29, 2023. FINDINGS: Mediastinal blood pool activity: SUV max 2 Liver activity: SUV max  2.8 NECK: Focal asymmetric subcentimeter metabolic activity of the left Rosenmuller fossa with max SUV 3.7, previously symmetric activity in this region. Incidental CT findings: Cystic lesion with photopenia in midline sublingual measuring 3 cm, stable to prior likely benign. Heterogeneous thyroid  gland without focal activity. Please note PET-CT is suboptimal for evaluation of brain metastasis. Correlate with dedicated MRI. CHEST: Left upper lobe subpleural wedge-shaped consolidation measuring 2.5 x 2 cm with max SUV 2.7 previously , measured 2.4 x 1.7 cm with max SUV 3.8. Posterior right upper lobe nodule measuring 5 mm, max SUV 2.3, stable (7/21). Sub solid nodule in subpleural posteromedial right lower lobe in right paravertebral measuring 1.6 x 0.9 cm max SUV 1.8, previously same size max SUV 1.7, (image 7/38). Interval resolution left lower lobe subpleural ground-glass attenuation. 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 No pleural effusion. The heart size is enlarged. Two lead pacer in place. Atherosclerotic calcifications of coronary arteries. The Incidental CT findings: Right-sided porta catheter tip terminates in cavoatrial junction. Dual lead pacer in place. ABDOMEN/PELVIS: Diffuse calcifications of its the spleen suggestive of prior ligamentous disease. Right kidney  simple cortical cysts without FDG uptake. No suspicious focal uptake within the liver. Colonic diverticulosis. Prostatomegaly without focal uptake. Posterior changes along the anterior abdominal wall from prior hernia mesh repair. Incidental CT findings: Atherosclerotic calcifications of aorta and branches . SKELETON: No suspicious lesion. Degenerative changes of the spine and bilateral SI joints. Incidental CT findings: Stable right gluteal lipoma without FDG uptake. IMPRESSION: Grossly unchanged left upper lobe subpleural mass/consolidation with mild interval decreased metabolic activity. Remainder of the pulmonary nodules including the right posteromedial paravertebral nodule are stable to prior. Multifocal primary malignancy (adenocarcinoma spectrum) and metastasis are in differential diagnosis. Recommend follow-up according to oncology protocols. No new findings to suggest distant metastasis or nodal disease. Previously seen enhancing liver lesion in segment 4 is not conspicuous on current noncontrast CT and is non FDG avid likely benign. Stable cystic lesion in midline sublingual cyst without FDG uptake, likely benign. Electronically Signed   By: Megan  Zare M.D.   On: 06/27/2024 17:01     Past medical hx Past Medical History:  Diagnosis Date   Acute renal insufficiency    AICD (automatic cardioverter/defibrillator) present    Anginal pain (HCC)    CHF (congestive heart failure) (HCC)    Chronic lower back pain    due to 3 ruptured discs in my back (11/08/2018)   High cholesterol    High cholesterol    History of kidney stones 1982   passed them   History of radiation therapy    Right lung- 11/03/21-11/14/21- Dr. Lynwood Nay   Hypertension    Lung cancer [facility])    Myocardial infarction (HCC) 03/2018   Systolic heart failure (HCC)    Tobacco use      Social History   Tobacco Use   Smoking status: Former    Current packs/day: 1.50    Average packs/day: 1.5 packs/day for 47.0  years (70.5 ttl pk-yrs)    Types: Cigarettes   Smokeless tobacco: Former   Tobacco comments:    Pt smokes 1 cig weekly when something bothers him. AB, CMA 07-10-24        1 cigarette smoked daily 05/28/22 ARJ   Vaping Use   Vaping status: Never Used  Substance Use Topics   Alcohol use: Not Currently    Comment: 11/08/2018 stopped 09/23/1998   Drug use: Not Currently  Comment: 11/08/2018 nothing in the 2000s    Juan Norris reports that he has quit smoking. His smoking use included cigarettes. He has a 70.5 pack-year smoking history. He has quit using smokeless tobacco. He reports that he does not currently use alcohol. He reports that he does not currently use drugs.  Tobacco Cessation: Counseling given: Not Answered Tobacco comments: Pt smokes 1 cig weekly when something bothers him. AB, CMA 07-10-24  1 cigarette smoked daily 05/28/22 ARJ  Current every day smoker, still smokes 1 cigarette a day. Current every day smoker , I spent 3-4 minutes counseling patient on  steps to stop use of tobacco products. I have provided patient with information on receiving free nicotine  replacement therapy, and contact numbers for hypnosis for smoking cessation as well as acupuncture for smoking cessation.  Past surgical hx, Family hx, Social hx all reviewed.  Current Outpatient Medications on File Prior to Visit  Medication Sig   aspirin  81 MG EC tablet Take 1 tablet (81 mg total) by mouth daily.   atorvastatin  (LIPITOR ) 40 MG tablet Take 1 tablet (40 mg total) by mouth at bedtime.   carvedilol  (COREG ) 25 MG tablet Take 12.5 mg by mouth daily.   furosemide  (LASIX ) 20 MG tablet Take 1 tablet (20 mg total) by mouth daily. As directed may take 1 additional tablet by mouth daily only as needed for weight gain of 2 lbs in 24 hours or 5 lbs in a week   Multiple Vitamin (MULTIVITAMIN WITH MINERALS) TABS tablet Take 1 tablet by mouth daily.   sacubitril -valsartan  (ENTRESTO ) 24-26 MG Take 1 tablet by mouth  2 (two) times daily.   spironolactone  (ALDACTONE ) 25 MG tablet Take 0.5 tablets (12.5 mg total) by mouth daily.   No current facility-administered medications on file prior to visit.     No Known Allergies  Review Of Systems:  Constitutional:   No  weight loss, night sweats,  Fevers, chills, fatigue, or  lassitude.  HEENT:   No headaches,  Difficulty swallowing,  Tooth/dental problems, or  Sore throat,                No sneezing, itching, ear ache, nasal congestion, post nasal drip,   CV:  No chest pain,  Orthopnea, PND, swelling in lower extremities, anasarca, dizziness, palpitations, syncope.   GI  No heartburn, indigestion, abdominal pain, nausea, vomiting, diarrhea, change in bowel habits, loss of appetite, bloody stools.   Resp: + shortness of breath with exertion less at rest.  No excess mucus, no productive cough,  No non-productive cough,  No coughing up of blood.  No change in color of mucus.  No wheezing.  No chest wall deformity  Skin: no rash or lesions.  GU: no dysuria, change in color of urine, no urgency or frequency.  No flank pain, no hematuria   MS:  No joint pain or swelling.  No decreased range of motion.  No back pain.  Psych:  No change in mood or affect. No depression or anxiety.  No memory loss.   Vital Signs BP 134/62 (BP Location: Right Arm, Patient Position: Sitting, Cuff Size: Normal)   Pulse 62   Temp 97.7 F (36.5 C) (Temporal)   Ht 5' 6 (1.676 m)   Wt 150 lb 9.6 oz (68.3 kg)   SpO2 99%   BMI 24.31 kg/m    Physical Exam:  General- No distress,  A&Ox3, pleasant ENT: No sinus tenderness, TM clear, pale nasal mucosa, no oral exudate,no post nasal  drip, no LAN Cardiac: S1, S2, regular rate and rhythm, no murmur Chest: No wheeze/ rales/ dullness; no accessory muscle use, no nasal flaring, no sternal retractions, Abd.: Soft Non-tender, ND, BS +, Body mass index is 24.31 kg/m.  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 Recurrence to lymph nodes Plan We have reviewed your PET scan. Your oncologist at the [facility] has asked us  to do a biopsy of some lymph nodes, and the lung nodule that is concerning on the scan.  I have placed an order for a bronchoscopy with biopsies.  We have discussed the procedure in detail.  We have reviewed the risks and benefits of the procedure. These include bleeding, infection, puncture of the lung, and adverse reaction to anesthesia. You have agreed to proceed with biopsy to evaluate nodule of concern Your procedure will be done by Dr. Lamar Chris. You will receive a letter today with date time and information pertaining to the procedure. You will need someone to drive you to the procedure, stay with you during the procedure, and stay with you after the procedure. You will also need someone to stay with you for 24 hours after anesthesia to ensure you have cleared and are doing well. You will follow-up with me 1 week after the procedure to review the results and to ensure you are doing well. Call if you need us  prior to the procedure or if you have any questions at all. Please contact office for sooner follow up if symptoms do not improve or worsen or seek emergency care .    I spent 40 minutes dedicated to the care of this patient on the date of this encounter to include pre-visit review of records, face-to-face time with the patient discussing conditions above, post visit ordering of testing, clinical documentation with the electronic health record, making appropriate referrals as documented, and communicating necessary information to the patient's healthcare team.      Lauraine JULIANNA Lites, NP 07/10/2024  9:04 AM

## 2024-07-10 NOTE — Telephone Encounter (Signed)
 Please schedule the following:  Provider performing procedure: Byrum Diagnosis:  Lung nodule Which side if for nodule / mass? Bilateral Procedure:  Navigational bronchoscopy with biopsies  Has patient been spoken to by Provider and given informed consent?  Yes Anesthesia:  General Do you need Fluro?  Yes Duration of procedure: 1.5 Date: 07/24/2024 Alternate Date: 07/25/2024  Time:Would like a 9 am arrival time ( Later bronch) as he cannot get any other ride [facility]: [facility] Does patient have OSA? No DM? No Or Latex allergy? No Medication Restriction/ Anticoagulate/Antiplatelet:  Aspirin  daily will need to be held x 48 hours prior to the procedure Pre-op Labs Ordered:determined by Anesthesia Imaging request:  Last scan 06/02/2024 from [place] but images in [facility]IC (If, SuperDimension CT Chest, please have STAT courier sent to [facility])

## 2024-07-10 NOTE — H&P (View-Only) (Signed)
 History of Present Illness Juan Norris is a 73 y.o. male former smoker with additional PMH of CAD with systolic CHF and AICD in place. Also with hypertension and hyperlipidemia. He was referred to Dr. Shelah for evaluation of lung nodule 09/2021.   Synopsis Pt.  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 Shannon from 11/03/21 through 11/14/21.  Follow-up CT chest imaging had subsequently remained stable without evidence of progression or recurrence through April 2023 when he last saw Dr. Shannon.  The plan at that time was for a follow-up scan in 6 months but he was lost to follow-up at the cancer center thereafter, apparently being followed with serial CT chest scans with Dr. Maranda, in medical oncology at the [facility].  Reportedly, his scans had remained without evidence of disease recurrence or progression as of his last scan in August 2024.  He presented to the emergency department on 11/12/2023 with complaints of diplopia and dizziness for 2 days.  A CT angio head and neck was performed on admission and showed masses in the left occipital lobe and right posterior fossa with significant edema and mass effect with effacement of the fourth ventricle.  Also noted was new nodularity and pleural thickening in the left upper lobe lung.  He was started on dexamethasone  4 mg p.o. 3 times daily and has had resolution of the diplopia and dizziness since starting the steroids.  A CT C/A/P was performed on 11/13/2023 and showed a stable, smaller appearance of the treated RLL nodule but a new 1.3 cm LUL nodule and a 5 mm RUL nodule.  Additionally, a 2 cm enhancing liver lesion was noted.  A CT head showed a 2.6 cm peripherally enhancing mass in the left occipital cortex and a 3.5 cm enhancing mass in the inferior right cerebellum.  MRI abdomen was performed on 11/15/2023 for  further evaluation of the liver lesion and suggests this lesion is incidental, benign focal nodular hyperplasia rather than a metastasis.     An MRI brain scan on 11/15/2023 confirmed peripherally enhancing, partially cystic masses in the left occipital lobe and right cerebellum, consistent with metastatic disease. The right cerebellar mass was causing associated mass effect on the fourth ventricle, right pons, and right medulla. No hydrocephalus. Dr. Joshua, of neurosurgery, was consulted and recommended fractionated stereotactic radiosurgery as opposed to surgical resection. He subsequently completed 3 fractions of SRS to the brain under the care of Dr. Patrcia on 11/19/23, 11/22/23, and 11/24/23.    Subsequently, he underwent restaging PET scan on 11/29/23 showing an 11 mm subpleural nodule in the lateral LUL, suspicious for recurrence/metastasis vs primary bronchogenic carcinoma as well as a 4 mm subpleural nodule in RUL, beneath size threshold for PET sensitivity. The hepatic lesions were non-FDG-avid, favoring suspected benign etiology. He was referred back to pulmonology and proceeded with bronchoscopy with biopsies on 12/27/23 under the care of Dr. Shelah. Cytology from both samples taken from the LUL lung nodule confirmed NSCLC, adenocarcinoma.  We saw him back in the clinic on 01/07/24 and he elected to proceed with the recommended 3-5 fraction course of stereotactic body radiotherapy (SBRT) to the new, left upper lobe lung nodule; completed 01/27/24.    He tolerated the treatments well and remains without complaints regarding the chest and brain radiation.    He continues his  routine follow up with Dr. Geneva in medical oncology at the [facility] for continued management of his systemic disease and had a restaging CT Chest on 05/30/24. He has a scheduled follow up with her on 06/07/24 to review those results and recommendations, currently considering starting on maintenance systemic therapy pending final results from  molecular profile.   Recent post-treatment MRI brain scan from 05/24/2024 continues to show a good response to treatment with an overall decrease in size of the 2 treated brain lesions in the right cerebellum and left occipital lobe and no new metastases identified. There was some marginal interval enlargement of the treated right cerebellar lesion as compared to the previous scan in March 2025, felt to be most consistent with treatment effect.  Recommendation is to repeat a short interval MRI brain scan in 2 months for close monitoring.   Pt. Has been followed by the [facility] oncology team also, and has been having surveillance scanning. Repeat PET 06/23/2024 showed Multifocal primary malignancy (adenocarcinoma spectrum) and metastasis are in differential diagnosis. The oncologist from the [facility] has requested an EBUS to further evaluate this finding. He is here today to discuss procedure and to schedule if the patient is in agreement.    07/10/2024 Pt. Presents for follow up at the request of the [facility] oncology team, Dr. Geneva. He understands that his oncology team at the [facility] have requested a bronchoscopy with biopsy and EBUS. He understands this is to further evaluate the right posteromedial paravertebral nodule  As well as other nodules for malignancy. He has been through the bronchoscopy with biopsies in the past, and understands the procedure, and the risks of bleeding, infection, pneumothorax and adverse reaction to anesthesia. We reviewed these again today. He is in agreement with the plan.   He will be scheduled for bronchoscopy with biopsies and EBUS. He will follow up with me 1 week after to review the results and determine next best steps in plan of care. He understands we will need to do a repeat Super D CT Chest for navigational staging, and that he will get a call to get this scheduled.    Test Results: PET scan 06/23/2024 Mediastinal blood pool activity: SUV max 2   Liver activity: SUV max 2.8    NECK: Focal asymmetric subcentimeter metabolic activity of the left Rosenmuller fossa with max SUV 3.7, previously symmetric activity in this region.   Incidental CT findings: Cystic lesion with photopenia in midline sublingual measuring 3 cm, stable to prior likely benign. Heterogeneous thyroid  gland without focal activity. Please note PET-CT is suboptimal for evaluation of brain metastasis. Correlate with dedicated MRI.   CHEST: Left upper lobe subpleural wedge-shaped consolidation measuring 2.5 x 2 cm with max SUV 2.7 previously , measured 2.4 x 1.7 cm with max SUV 3.8.   Posterior right upper lobe nodule measuring 5 mm, max SUV 2.3, stable (7/21).   Sub solid nodule in subpleural posteromedial right lower lobe in right paravertebral measuring 1.6 x 0.9 cm max SUV 1.8, previously same size max SUV 1.7, (image 7/38).   Interval resolution left lower lobe subpleural ground-glass attenuation.   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   No pleural effusion.   The heart size is enlarged. Two lead pacer in place. Atherosclerotic calcifications of coronary arteries. The   Incidental CT findings: Right-sided porta catheter tip terminates in cavoatrial junction. Dual lead pacer in place.   ABDOMEN/PELVIS: Diffuse calcifications of its the spleen suggestive  of prior ligamentous disease. Right kidney simple cortical cysts without FDG uptake. No suspicious focal uptake within the liver. Colonic diverticulosis. Prostatomegaly without focal uptake. Posterior changes along the anterior abdominal wall from prior hernia mesh repair.   Incidental CT findings: Atherosclerotic calcifications of aorta and branches .   SKELETON: No suspicious lesion. Degenerative changes of the spine and bilateral SI joints.   Incidental CT findings: Stable right gluteal lipoma without FDG uptake.   IMPRESSION: Grossly unchanged  left upper lobe subpleural mass/consolidation with mild interval decreased metabolic activity.   Remainder of the pulmonary nodules including the right posteromedial paravertebral nodule are stable to prior. Multifocal primary malignancy (adenocarcinoma spectrum) and metastasis are in differential diagnosis. Recommend follow-up according to oncology protocols.   No new findings to suggest distant metastasis or nodal disease.   Previously seen enhancing liver lesion in segment 4 is not conspicuous on current noncontrast CT and is non FDG avid likely benign.   Stable cystic lesion in midline sublingual cyst without FDG uptake, likely benign.  CT Chest [facility]05/[facility]24 ( [facility]                    Latest Ref Rng & Units 12/27/2023   11:24 AM 12/03/2023   10:52 AM 11/13/2023    4:38 AM  CBC  WBC 4.0 - 10.5 K/uL  15.3  5.4   Hemoglobin 13.0 - 17.0 g/dL 85.6  86.3  87.8   Hematocrit 39.0 - 52.0 % 42.0  38.8  36.3   Platelets 150 - 400 K/uL  117  191        Latest Ref Rng & Units 02/03/2024    1:35 PM 12/27/2023   11:24 AM 12/03/2023   10:52 AM  BMP  Glucose 70 - 99 mg/dL 898  634  645   BUN 8 - 27 mg/dL 12  28  28    Creatinine 0.76 - 1.27 mg/dL 9.06  8.89  8.84   BUN/Creat Ratio 10 - 24 13     Sodium 134 - 144 mmol/L 145  139  135   Potassium 3.5 - 5.2 mmol/L 4.9  5.5  4.6   Chloride 96 - 106 mmol/L 106  105  101   CO2 20 - 29 mmol/L 25   28   Calcium  8.6 - 10.2 mg/dL 9.3   9.1     BNP    Component Value Date/Time   BNP 286.2 (H) 03/29/2018 1620    ProBNP    Component Value Date/Time   PROBNP 444 (H) 11/19/2022 0920   PROBNP 62.8 04/07/2012 2114    PFT    Component Value Date/Time   FEV1PRE 1.94 08/26/2021 1457   FEV1POST 1.71 08/26/2021 1457   FVCPRE 2.82 08/26/2021 1457   FVCPOST 3.08 08/26/2021 1457   TLC 5.82 08/26/2021 1457   DLCOUNC 11.88 08/26/2021 1457   PREFEV1FVCRT 69 08/26/2021 1457   PSTFEV1FVCRT 55 08/26/2021 1457    NM PET Image  Restage (PS) Skull Base to Thigh (F-18 FDG) Result Date: 06/27/2024 CLINICAL DATA:  Subsequent treatment strategy for lung nodule. EXAM: NUCLEAR MEDICINE PET SKULL BASE TO THIGH TECHNIQUE: 7.55 mCi F-18 FDG was injected intravenously. Full-ring PET imaging was performed from the skull base to thigh after the radiotracer. CT data was obtained and used for attenuation correction and anatomic localization. Fasting blood glucose:  93 mg/dl COMPARISON:  Chest CT February 23, 2024, PET-CT November 29, 2023. FINDINGS: Mediastinal blood pool activity: SUV max 2 Liver activity: SUV max  2.8 NECK: Focal asymmetric subcentimeter metabolic activity of the left Rosenmuller fossa with max SUV 3.7, previously symmetric activity in this region. Incidental CT findings: Cystic lesion with photopenia in midline sublingual measuring 3 cm, stable to prior likely benign. Heterogeneous thyroid  gland without focal activity. Please note PET-CT is suboptimal for evaluation of brain metastasis. Correlate with dedicated MRI. CHEST: Left upper lobe subpleural wedge-shaped consolidation measuring 2.5 x 2 cm with max SUV 2.7 previously , measured 2.4 x 1.7 cm with max SUV 3.8. Posterior right upper lobe nodule measuring 5 mm, max SUV 2.3, stable (7/21). Sub solid nodule in subpleural posteromedial right lower lobe in right paravertebral measuring 1.6 x 0.9 cm max SUV 1.8, previously same size max SUV 1.7, (image 7/38). Interval resolution left lower lobe subpleural ground-glass attenuation. 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 No pleural effusion. The heart size is enlarged. Two lead pacer in place. Atherosclerotic calcifications of coronary arteries. The Incidental CT findings: Right-sided porta catheter tip terminates in cavoatrial junction. Dual lead pacer in place. ABDOMEN/PELVIS: Diffuse calcifications of its the spleen suggestive of prior ligamentous disease. Right kidney  simple cortical cysts without FDG uptake. No suspicious focal uptake within the liver. Colonic diverticulosis. Prostatomegaly without focal uptake. Posterior changes along the anterior abdominal wall from prior hernia mesh repair. Incidental CT findings: Atherosclerotic calcifications of aorta and branches . SKELETON: No suspicious lesion. Degenerative changes of the spine and bilateral SI joints. Incidental CT findings: Stable right gluteal lipoma without FDG uptake. IMPRESSION: Grossly unchanged left upper lobe subpleural mass/consolidation with mild interval decreased metabolic activity. Remainder of the pulmonary nodules including the right posteromedial paravertebral nodule are stable to prior. Multifocal primary malignancy (adenocarcinoma spectrum) and metastasis are in differential diagnosis. Recommend follow-up according to oncology protocols. No new findings to suggest distant metastasis or nodal disease. Previously seen enhancing liver lesion in segment 4 is not conspicuous on current noncontrast CT and is non FDG avid likely benign. Stable cystic lesion in midline sublingual cyst without FDG uptake, likely benign. Electronically Signed   By: Megan  Zare M.D.   On: 06/27/2024 17:01     Past medical hx Past Medical History:  Diagnosis Date   Acute renal insufficiency    AICD (automatic cardioverter/defibrillator) present    Anginal pain (HCC)    CHF (congestive heart failure) (HCC)    Chronic lower back pain    due to 3 ruptured discs in my back (11/08/2018)   High cholesterol    High cholesterol    History of kidney stones 1982   passed them   History of radiation therapy    Right lung- 11/03/21-11/14/21- Dr. Lynwood Nay   Hypertension    Lung cancer [facility])    Myocardial infarction (HCC) 03/2018   Systolic heart failure (HCC)    Tobacco use      Social History   Tobacco Use   Smoking status: Former    Current packs/day: 1.50    Average packs/day: 1.5 packs/day for 47.0  years (70.5 ttl pk-yrs)    Types: Cigarettes   Smokeless tobacco: Former   Tobacco comments:    Pt smokes 1 cig weekly when something bothers him. AB, CMA 07-10-24        1 cigarette smoked daily 05/28/22 ARJ   Vaping Use   Vaping status: Never Used  Substance Use Topics   Alcohol use: Not Currently    Comment: 11/08/2018 stopped 09/23/1998   Drug use: Not Currently  Comment: 11/08/2018 nothing in the 2000s    Mr.Naval reports that he has quit smoking. His smoking use included cigarettes. He has a 70.5 pack-year smoking history. He has quit using smokeless tobacco. He reports that he does not currently use alcohol. He reports that he does not currently use drugs.  Tobacco Cessation: Counseling given: Not Answered Tobacco comments: Pt smokes 1 cig weekly when something bothers him. AB, CMA 07-10-24  1 cigarette smoked daily 05/28/22 ARJ  Current every day smoker, still smokes 1 cigarette a day. Current every day smoker , I spent 3-4 minutes counseling patient on  steps to stop use of tobacco products. I have provided patient with information on receiving free nicotine  replacement therapy, and contact numbers for hypnosis for smoking cessation as well as acupuncture for smoking cessation.  Past surgical hx, Family hx, Social hx all reviewed.  Current Outpatient Medications on File Prior to Visit  Medication Sig   aspirin  81 MG EC tablet Take 1 tablet (81 mg total) by mouth daily.   atorvastatin  (LIPITOR ) 40 MG tablet Take 1 tablet (40 mg total) by mouth at bedtime.   carvedilol  (COREG ) 25 MG tablet Take 12.5 mg by mouth daily.   furosemide  (LASIX ) 20 MG tablet Take 1 tablet (20 mg total) by mouth daily. As directed may take 1 additional tablet by mouth daily only as needed for weight gain of 2 lbs in 24 hours or 5 lbs in a week   Multiple Vitamin (MULTIVITAMIN WITH MINERALS) TABS tablet Take 1 tablet by mouth daily.   sacubitril -valsartan  (ENTRESTO ) 24-26 MG Take 1 tablet by mouth  2 (two) times daily.   spironolactone  (ALDACTONE ) 25 MG tablet Take 0.5 tablets (12.5 mg total) by mouth daily.   No current facility-administered medications on file prior to visit.     No Known Allergies  Review Of Systems:  Constitutional:   No  weight loss, night sweats,  Fevers, chills, fatigue, or  lassitude.  HEENT:   No headaches,  Difficulty swallowing,  Tooth/dental problems, or  Sore throat,                No sneezing, itching, ear ache, nasal congestion, post nasal drip,   CV:  No chest pain,  Orthopnea, PND, swelling in lower extremities, anasarca, dizziness, palpitations, syncope.   GI  No heartburn, indigestion, abdominal pain, nausea, vomiting, diarrhea, change in bowel habits, loss of appetite, bloody stools.   Resp: + shortness of breath with exertion less at rest.  No excess mucus, no productive cough,  No non-productive cough,  No coughing up of blood.  No change in color of mucus.  No wheezing.  No chest wall deformity  Skin: no rash or lesions.  GU: no dysuria, change in color of urine, no urgency or frequency.  No flank pain, no hematuria   MS:  No joint pain or swelling.  No decreased range of motion.  No back pain.  Psych:  No change in mood or affect. No depression or anxiety.  No memory loss.   Vital Signs BP 134/62 (BP Location: Right Arm, Patient Position: Sitting, Cuff Size: Normal)   Pulse 62   Temp 97.7 F (36.5 C) (Temporal)   Ht 5' 6 (1.676 m)   Wt 150 lb 9.6 oz (68.3 kg)   SpO2 99%   BMI 24.31 kg/m    Physical Exam:  General- No distress,  A&Ox3, pleasant ENT: No sinus tenderness, TM clear, pale nasal mucosa, no oral exudate,no post nasal  drip, no LAN Cardiac: S1, S2, regular rate and rhythm, no murmur Chest: No wheeze/ rales/ dullness; no accessory muscle use, no nasal flaring, no sternal retractions, Abd.: Soft Non-tender, ND, BS +, Body mass index is 24.31 kg/m.  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 Recurrence to lymph nodes Plan We have reviewed your PET scan. Your oncologist at the [facility] has asked us  to do a biopsy of some lymph nodes, and the lung nodule that is concerning on the scan.  I have placed an order for a bronchoscopy with biopsies.  We have discussed the procedure in detail.  We have reviewed the risks and benefits of the procedure. These include bleeding, infection, puncture of the lung, and adverse reaction to anesthesia. You have agreed to proceed with biopsy to evaluate nodule of concern Your procedure will be done by Dr. Lamar Chris. You will receive a letter today with date time and information pertaining to the procedure. You will need someone to drive you to the procedure, stay with you during the procedure, and stay with you after the procedure. You will also need someone to stay with you for 24 hours after anesthesia to ensure you have cleared and are doing well. You will follow-up with me 1 week after the procedure to review the results and to ensure you are doing well. Call if you need us  prior to the procedure or if you have any questions at all. Please contact office for sooner follow up if symptoms do not improve or worsen or seek emergency care .    I spent 40 minutes dedicated to the care of this patient on the date of this encounter to include pre-visit review of records, face-to-face time with the patient discussing conditions above, post visit ordering of testing, clinical documentation with the electronic health record, making appropriate referrals as documented, and communicating necessary information to the patient's healthcare team.      Lauraine JULIANNA Lites, NP 07/10/2024  9:04 AM

## 2024-07-10 NOTE — Telephone Encounter (Signed)
 Per Lauraine Lites, NP in a secure chat,  Can you call Mr. Juan Norris and let him know he will need a repeat Ct Chest before the bronchoscopy to plan the navigation. The CT in the system done 05/2024 did not work for navigation. Thanks. He will get a call to get it scheduled before the bronch   I called and spoke to pt. Pt informed of Sarah's note and verbalized understanding. CT ordered. NFN

## 2024-07-10 NOTE — Patient Instructions (Addendum)
 It is good to see you today. We have reviewed your PET scan. Your oncologist at the [facility] has asked us  to do a biopsy of some lymph nodes, and the lung nodule that is concerning on the scan.  I have placed an order for a bronchoscopy with biopsies.  We have discussed the procedure in detail.  We have reviewed the risks and benefits of the procedure. These include bleeding, infection, puncture of the lung, and adverse reaction to anesthesia. You have agreed to proceed with biopsy to evaluate nodule of concern Your procedure will be done by Dr. Lamar Chris. You will receive a letter today with date time and information pertaining to the procedure. You will need someone to drive you to the procedure, stay with you during the procedure, and stay with you after the procedure. You will also need someone to stay with you for 24 hours after anesthesia to ensure you have cleared and are doing well. You will follow-up with me 1 week after the procedure to review the results and to ensure you are doing well. Call if you need us  prior to the procedure or if you have any questions at all. Please contact office for sooner follow up if symptoms do not improve or worsen or seek emergency care .

## 2024-07-11 ENCOUNTER — Encounter (HOSPITAL_COMMUNITY): Payer: Self-pay

## 2024-07-11 ENCOUNTER — Emergency Department (HOSPITAL_COMMUNITY)

## 2024-07-11 ENCOUNTER — Ambulatory Visit (HOSPITAL_COMMUNITY): Admission: EM | Admit: 2024-07-11 | Discharge: 2024-07-11 | Disposition: A | Discharge status: Home / Self Care

## 2024-07-11 ENCOUNTER — Encounter (HOSPITAL_COMMUNITY): Payer: Self-pay | Admitting: *Deleted

## 2024-07-11 ENCOUNTER — Emergency Department (HOSPITAL_COMMUNITY)
Admission: EM | Admit: 2024-07-11 | Discharge: 2024-07-12 | Disposition: A | Discharge status: Home / Self Care | Source: Ambulatory Visit | Attending: Emergency Medicine | Admitting: Emergency Medicine

## 2024-07-11 ENCOUNTER — Other Ambulatory Visit: Payer: Self-pay

## 2024-07-11 DIAGNOSIS — Z85118 Personal history of other malignant neoplasm of bronchus and lung: Secondary | ICD-10-CM | POA: Diagnosis not present

## 2024-07-11 DIAGNOSIS — R519 Headache, unspecified: Secondary | ICD-10-CM

## 2024-07-11 DIAGNOSIS — R2689 Other abnormalities of gait and mobility: Secondary | ICD-10-CM

## 2024-07-11 DIAGNOSIS — Z79899 Other long term (current) drug therapy: Secondary | ICD-10-CM | POA: Diagnosis not present

## 2024-07-11 DIAGNOSIS — C7931 Secondary malignant neoplasm of brain: Secondary | ICD-10-CM | POA: Insufficient documentation

## 2024-07-11 DIAGNOSIS — Z7982 Long term (current) use of aspirin: Secondary | ICD-10-CM | POA: Diagnosis not present

## 2024-07-11 DIAGNOSIS — Z720 Tobacco use: Secondary | ICD-10-CM | POA: Diagnosis not present

## 2024-07-11 DIAGNOSIS — I5020 Unspecified systolic (congestive) heart failure: Secondary | ICD-10-CM | POA: Diagnosis not present

## 2024-07-11 DIAGNOSIS — C801 Malignant (primary) neoplasm, unspecified: Secondary | ICD-10-CM | POA: Diagnosis not present

## 2024-07-11 DIAGNOSIS — I110 Hypertensive heart disease with heart failure: Secondary | ICD-10-CM | POA: Diagnosis not present

## 2024-07-11 LAB — COMPREHENSIVE METABOLIC PANEL WITH GFR
ALT: 20 U/L (ref 0–44)
AST: 24 U/L (ref 15–41)
Albumin: 3.8 g/dL (ref 3.5–5.0)
Alkaline Phosphatase: 70 U/L (ref 38–126)
Anion gap: 9 (ref 5–15)
BUN: 11 mg/dL (ref 8–23)
CO2: 23 mmol/L (ref 22–32)
Calcium: 9.1 mg/dL (ref 8.9–10.3)
Chloride: 106 mmol/L (ref 98–111)
Creatinine, Ser: 1.05 mg/dL (ref 0.61–1.24)
GFR, Estimated: >60 mL/min (ref >60)
Glucose, Bld: 109 mg/dL — ABNORMAL HIGH (ref 70–99)
Potassium: 4.2 mmol/L (ref 3.5–5.1)
Sodium: 138 mmol/L (ref 135–145)
Total Bilirubin: 1.1 mg/dL (ref 0.0–1.2)
Total Protein: 6.4 g/dL — ABNORMAL LOW (ref 6.5–8.1)

## 2024-07-11 LAB — DIFFERENTIAL
Abs Immature Granulocytes: 0.03 K/uL (ref 0.00–0.07)
Basophils Absolute: 0 K/uL (ref 0.0–0.1)
Basophils Relative: 0 %
Eosinophils Absolute: 0 K/uL (ref 0.0–0.5)
Eosinophils Relative: 1 %
Immature Granulocytes: 0 %
Lymphocytes Relative: 21 %
Lymphs Abs: 1.5 K/uL (ref 0.7–4.0)
Monocytes Absolute: 0.6 K/uL (ref 0.1–1.0)
Monocytes Relative: 8 %
Neutro Abs: 5.1 K/uL (ref 1.7–7.7)
Neutrophils Relative %: 70 %

## 2024-07-11 LAB — I-STAT CHEM 8, ED
BUN: 13 mg/dL (ref 8–23)
Calcium, Ion: 1.01 mmol/L — ABNORMAL LOW (ref 1.15–1.40)
Chloride: 106 mmol/L (ref 98–111)
Creatinine, Ser: 1.1 mg/dL (ref 0.61–1.24)
Glucose, Bld: 101 mg/dL — ABNORMAL HIGH (ref 70–99)
HCT: 43 % (ref 39.0–52.0)
Hemoglobin: 14.6 g/dL (ref 13.0–17.0)
Potassium: 4.1 mmol/L (ref 3.5–5.1)
Sodium: 139 mmol/L (ref 135–145)
TCO2: 22 mmol/L (ref 22–32)

## 2024-07-11 LAB — CBC
HCT: 43.6 % (ref 39.0–52.0)
Hemoglobin: 14.4 g/dL (ref 13.0–17.0)
MCH: 29.6 pg (ref 26.0–34.0)
MCHC: 33 g/dL (ref 30.0–36.0)
MCV: 89.7 fL (ref 80.0–100.0)
Platelets: 182 K/uL (ref 150–400)
RBC: 4.86 MIL/uL (ref 4.22–5.81)
RDW: 13.8 % (ref 11.5–15.5)
WBC: 7.3 K/uL (ref 4.0–10.5)
nRBC: 0 % (ref 0.0–0.2)

## 2024-07-11 LAB — PROTIME-INR
INR: 1.1 (ref 0.8–1.2)
Prothrombin Time: 15.1 s (ref 11.4–15.2)

## 2024-07-11 LAB — ETHANOL: Alcohol, Ethyl (B): <15 mg/dL (ref <15)

## 2024-07-11 LAB — APTT: aPTT: 31 s (ref 24–36)

## 2024-07-11 LAB — CBG MONITORING, ED: Glucose-Capillary: 97 mg/dL (ref 70–99)

## 2024-07-11 NOTE — ED Triage Notes (Signed)
 Patient has headache that started yesterday morning on waking. Patient reports tylenol  helped but it came back. Turning his head makes it throb worse, endorses minimal nausea, no dizziness, focal deficits, or speech changes.

## 2024-07-11 NOTE — Progress Notes (Signed)
 I agree with the plans as outlined above.   Lamar Chris, MD, PhD 07/11/2024, 3:23 PM [facility] [phone] or if no answer before 7:00PM call [phone] For any issues after 7:00PM please call eLink [phone]

## 2024-07-11 NOTE — ED Provider Notes (Signed)
 [facility]    CSN: [id number] Arrival date & time: 07/11/24  9044      History   Chief Complaint Chief Complaint  Patient presents with   Headache    HPI Juan Norris is a 73 y.o. male.   Patient presents with a bad headache that began yesterday evening.  Patient describes the headache as throbbing and pulsating.  Patient states that at times the headache will feel like a burning sensation in his head.  Patient states that upon standing and turning his head to the left the pain will worsen and he also has pain that shoots down the left side of his neck with this.  Patient states that today he noticed that he felt a little off balance with walking but his headache was at its worst as well.  Patient states that he did take some Tylenol  without relief.  Patient states that yesterday he did have a moment where he felt very nauseated, but did not vomit.  Denies blurred vision, photophobia, weakness, numbness, confusion, slurred speech, and facial droop.  Past medical history includes hypertension, type 2 diabetes, CHF, myocardial infarction, metastatic lung cancer with brain mass, and CAD.  Patient states he takes a baby aspirin  daily.  Denies taking any other blood thinners.  Patient reports that he does take all his medications daily as prescribed.  The history is provided by the patient and medical records.  Headache   Past Medical History:  Diagnosis Date   Acute renal insufficiency    AICD (automatic cardioverter/defibrillator) present    Anginal pain (HCC)    CHF (congestive heart failure) (HCC)    Chronic lower back pain    due to 3 ruptured discs in my back (11/08/2018)   High cholesterol    High cholesterol    History of kidney stones 1982   passed them   History of radiation therapy    Right lung- 11/03/21-11/14/21- Dr. Lynwood Nay   Hypertension    Lung cancer [facility])    Myocardial infarction (HCC) 03/2018   Systolic heart failure (HCC)    Tobacco  use     Patient Active Problem List   Diagnosis Date Noted   Lung nodule 07/10/2024   Primary cancer of left upper lobe of lung (HCC) 01/07/2024   Metastatic cancer to brain (HCC) 11/13/2023   Elevated blood sugar 11/13/2023   Chronic periodontitis, unspecified 11/12/2023   Encounter for other preprocedural examination 11/12/2023   History of colonic polyps 11/12/2023   Malignant neoplasm of lower lobe of right lung (HCC) 11/12/2023   Type 2 diabetes mellitus without complications (HCC) 11/12/2023   Diplopia 11/12/2023   Dizziness 11/12/2023   Brain mass 11/12/2023   COPD (chronic obstructive pulmonary disease) (HCC) 05/28/2022   Left leg numbness 08/31/2021   CHF (congestive heart failure) (HCC) 08/31/2021   HTN (hypertension) 08/31/2021   Pulmonary nodule 08/20/2021   Chronic systolic dysfunction of left ventricle 11/08/2018   Tobacco use 03/31/2018   Acute combined systolic and diastolic heart failure (HCC) 03/31/2018   Acute renal insufficiency 03/31/2018   Elevated troponin    NSTEMI (non-ST elevated myocardial infarction) (HCC) 03/29/2018   Bilateral inguinal hernia 01/24/2016   Chronic low back pain 07/10/2015   CAD (coronary artery disease) 12/14/2012   Chest pain 04/07/2012   History of heart attack 04/07/2012    Past Surgical History:  Procedure Laterality Date   BIV ICD INSERTION CRT-D N/A 11/08/2018    [employer] biventricular ICD implantation by  Dr Kelsie with His Bundle pacing and a MDT 3830 lead placed into the LV port.    BRONCHIAL BIOPSY  10/13/2021   Procedure: BRONCHIAL BIOPSIES;  Surgeon: Shelah Lamar RAMAN, MD;  Location: [facility] ENDOSCOPY;  Service: Pulmonary;;   BRONCHIAL BIOPSY  12/27/2023   Procedure: BRONCHIAL BIOPSIES;  Surgeon: Shelah Lamar RAMAN, MD;  Location: [facility] ENDOSCOPY;  Service: Pulmonary;;   BRONCHIAL BRUSHINGS  10/13/2021   Procedure: BRONCHIAL BRUSHINGS;  Surgeon: Shelah Lamar RAMAN, MD;  Location: [facility] ENDOSCOPY;  Service: Pulmonary;;   BRONCHIAL  BRUSHINGS  12/27/2023   Procedure: BRONCHIAL BRUSHINGS;  Surgeon: Shelah Lamar RAMAN, MD;  Location: [facility] ENDOSCOPY;  Service: Pulmonary;;   BRONCHIAL NEEDLE ASPIRATION BIOPSY  10/13/2021   Procedure: BRONCHIAL NEEDLE ASPIRATION BIOPSIES;  Surgeon: Shelah Lamar RAMAN, MD;  Location: [facility] ENDOSCOPY;  Service: Pulmonary;;   BRONCHIAL NEEDLE ASPIRATION BIOPSY  12/27/2023   Procedure: BRONCHIAL NEEDLE ASPIRATION BIOPSIES;  Surgeon: Shelah Lamar RAMAN, MD;  Location: [facility] ENDOSCOPY;  Service: Pulmonary;;   BRONCHIAL WASHINGS  10/13/2021   Procedure: BRONCHIAL WASHINGS;  Surgeon: Shelah Lamar RAMAN, MD;  Location: [facility] ENDOSCOPY;  Service: Pulmonary;;   CARDIAC CATHETERIZATION     FIDUCIAL MARKER PLACEMENT  10/13/2021   Procedure: FIDUCIAL MARKER PLACEMENT;  Surgeon: Shelah Lamar RAMAN, MD;  Location: [facility] ENDOSCOPY;  Service: Pulmonary;;   FIDUCIAL MARKER PLACEMENT  12/27/2023   Procedure: FIDUCIAL MARKER PLACEMENT;  Surgeon: Shelah Lamar RAMAN, MD;  Location: [facility] ENDOSCOPY;  Service: Pulmonary;;   INGUINAL HERNIA REPAIR Bilateral    IR IMAGING GUIDED PORT INSERTION  11/15/2023   KNEE SURGERY Left 1990s   growth on the back of my knee removed   LEFT HEART CATH AND CORONARY ANGIOGRAPHY N/A 03/30/2018   Procedure: LEFT HEART CATH AND CORONARY ANGIOGRAPHY;  Surgeon: Verlin Lonni BIRCH, MD;  Location: [facility] IN[facility]AS[facility] LAB;  Service: Cardiovascular;  Laterality: N/A;   VIDEO BRONCHOSCOPY WITH ENDOBRONCHIAL NAVIGATION N/A 10/13/2021   Procedure: ROBOTIC VIDEO BRONCHOSCOPY WITH ENDOBRONCHIAL NAVIGATION;  Surgeon: Shelah Lamar RAMAN, MD;  Location[facility] ENDOSCOPY;  Service: Pulmonary;  Laterality: N/A;   VIDEO BRONCHOSCOPY WITH RADIAL ENDOBRONCHIAL ULTRASOUND  10/13/2021   Procedure: RADIAL ENDOBRONCHIAL ULTRASOUND;  Surgeon: Shelah Lamar RAMAN, MD;  Location: [facility] ENDOSCOPY;  Service: Pulmonary;;       Home Medications    Prior to Admission medications   Medication Sig Start Date End Date Taking? Authorizing Provider  aspirin  81 MG EC  tablet Take 1 tablet (81 mg total) by mouth daily. 11/09/18  Yes Leverne Charlies Helling, PA-C  atorvastatin  (LIPITOR ) 40 MG tablet Take 1 tablet (40 mg total) by mouth at bedtime. 10/13/21  Yes Shelah Lamar RAMAN, MD  carvedilol  (COREG ) 25 MG tablet Take 12.5 mg by mouth daily.   Yes [provider]  furosemide  (LASIX ) 20 MG tablet Take 1 tablet (20 mg total) by mouth daily. As directed may take 1 additional tablet by mouth daily only as needed for weight gain of 2 lbs in 24 hours or 5 lbs in a week 02/03/24  Yes Dick, Ernest H Jr., NP  Multiple Vitamin (MULTIVITAMIN WITH MINERALS) TABS tablet Take 1 tablet by mouth daily.   Yes [provider]  sacubitril -valsartan  (ENTRESTO ) 24-26 MG Take 1 tablet by mouth 2 (two) times daily. 01/27/24  Yes [provider]  spironolactone  (ALDACTONE ) 25 MG tablet Take 0.5 tablets (12.5 mg total) by mouth daily. 03/11/20  Yes Maranda Leim DEL, MD    Family History Family History  Problem Relation Age of Onset  Leukemia Mother    Cancer Father    Cancer Sister    Cancer Brother    CAD Brother     Social History Social History   Tobacco Use   Smoking status: Former    Current packs/day: 1.50    Average packs/day: 1.5 packs/day for 47.0 years (70.5 ttl pk-yrs)    Types: Cigarettes   Smokeless tobacco: Former   Tobacco comments:    Pt smokes 1 cig weekly when something bothers him. AB, CMA 07-10-24        1 cigarette smoked daily 05/28/22 ARJ   Vaping Use   Vaping status: Never Used  Substance Use Topics   Alcohol use: Not Currently    Comment: 11/08/2018 stopped 09/23/1998   Drug use: Not Currently    Comment: 11/08/2018 nothing in the 2000s     Allergies   Patient has no known allergies.   Review of Systems Review of Systems  Neurological:  Positive for headaches.   Per HPI  Physical Exam Triage Vital Signs ED Triage Vitals [07/11/24 1017]  Encounter Vitals Group     BP (!) 137/57     Girls Systolic BP  Percentile      Girls Diastolic BP Percentile      Boys Systolic BP Percentile      Boys Diastolic BP Percentile      Pulse Rate 70     Resp 18     Temp (!) 97.5 F (36.4 C)     Temp src      SpO2 97 %     Weight      Height      Head Circumference      Peak Flow      Pain Score 9     Pain Loc      Pain Education      Exclude from Growth Chart    No data found.  Updated Vital Signs BP (!) 137/57   Pulse 70   Temp (!) 97.5 F (36.4 C)   Resp 18   SpO2 97%   Visual Acuity Right Eye Distance:   Left Eye Distance:   Bilateral Distance:    Right Eye Near:   Left Eye Near:    Bilateral Near:     Physical Exam Vitals and nursing note reviewed.  Constitutional:      General: He is awake. He is not in acute distress.    Appearance: Normal appearance. He is well-developed and well-groomed. He is not ill-appearing.  HENT:     Head: Normocephalic and atraumatic.  Eyes:     Extraocular Movements: Extraocular movements intact.     Conjunctiva/sclera: Conjunctivae normal.     Pupils: Pupils are equal, round, and reactive to light.  Cardiovascular:     Rate and Rhythm: Normal rate and regular rhythm.  Pulmonary:     Effort: Pulmonary effort is normal.     Breath sounds: Normal breath sounds.  Musculoskeletal:        General: Normal range of motion.     Cervical back: Normal range of motion and neck supple.  Skin:    General: Skin is warm and dry.  Neurological:     General: No focal deficit present.     Mental Status: He is alert and oriented to person, place, and time. Mental status is at baseline.     GCS: GCS eye subscore is 4. GCS verbal subscore is 5. GCS motor subscore is 6.  Cranial Nerves: Cranial nerves 2-12 are intact.     Sensory: Sensation is intact.     Motor: Motor function is intact.     Coordination: Coordination is intact.     Gait: Gait is intact.  Psychiatric:        Behavior: Behavior is cooperative.      UC Treatments / Results   Labs (all labs ordered are listed, but only abnormal results are displayed) Labs Reviewed - No data to display  EKG   Radiology No results found.  Procedures Procedures (including critical care time)  Medications Ordered in UC Medications - No data to display  Initial Impression / Assessment and Plan / UC Course  I have reviewed the triage vital signs and the nursing notes.  Pertinent labs & imaging results that were available during my care of the patient were reviewed by me and considered in my medical decision making (see chart for details).     Patient is overall well-appearing however when the pain begins to throb he does cradle his head.  Vitals are stable.  No neurological deficits noted on exam.  GCS 15.  EOMI and PERRLA.  Recommend patient is seen in the emergency department for further evaluation of bad headache due to description of pain as well as past medical history.  Patient is agreeable to plan at this time.  Patient is able to arrive to the ER via POV. Final Clinical Impressions(s) / UC Diagnoses   Final diagnoses:  Bad headache  Balance problem     Discharge Instructions      Please go to the ER for further evaluation of your bad headache.   ED Prescriptions   None    PDMP not reviewed this encounter.   Johnie Flaming A, NP 07/11/24 1113

## 2024-07-11 NOTE — Discharge Instructions (Addendum)
 Please go to the ER for further evaluation of your bad headache.

## 2024-07-11 NOTE — ED Triage Notes (Addendum)
 PT reports a HA that started last nigh t. Pt describes HA as if some one is squeezing his head . Then the HA starts to feel like Boom,Boom.Pt denies any othe Sx's. PT reports HA is worse when he turns his head to the left.

## 2024-07-11 NOTE — ED Triage Notes (Signed)
 Pt c/o HA x 2 days; sent by urgent care for further evaluation; taking tylenol  at home, some relief; denies other neuro deficits

## 2024-07-12 ENCOUNTER — Emergency Department (HOSPITAL_COMMUNITY)

## 2024-07-12 MED ORDER — DEXAMETHASONE SODIUM PHOSPHATE 10 MG/ML IJ SOLN
10.0000 mg | Freq: Once | INTRAMUSCULAR | Status: AC
  Administered 2024-07-12: 10 mg via INTRAVENOUS
  Filled 2024-07-12: qty 1

## 2024-07-12 MED ORDER — GADOBUTROL 1 MMOL/ML IV SOLN
6.5000 mL | Freq: Once | INTRAVENOUS | Status: AC | PRN
  Administered 2024-07-12: 6.5 mL via INTRAVENOUS

## 2024-07-12 MED ORDER — SACUBITRIL-VALSARTAN 24-26 MG PO TABS
1.0000 | ORAL_TABLET | Freq: Two times a day (BID) | ORAL | Status: DC
  Administered 2024-07-12: 1 via ORAL
  Filled 2024-07-12: qty 1

## 2024-07-12 MED ORDER — PROCHLORPERAZINE EDISYLATE 10 MG/2ML IJ SOLN
10.0000 mg | Freq: Once | INTRAMUSCULAR | Status: AC
  Administered 2024-07-12: 10 mg via INTRAVENOUS
  Filled 2024-07-12: qty 2

## 2024-07-12 MED ORDER — DIPHENHYDRAMINE HCL 50 MG/ML IJ SOLN
12.5000 mg | Freq: Once | INTRAMUSCULAR | Status: AC
  Administered 2024-07-12: 12.5 mg via INTRAVENOUS
  Filled 2024-07-12: qty 1

## 2024-07-12 MED ORDER — ASPIRIN 81 MG PO TBEC
81.0000 mg | DELAYED_RELEASE_TABLET | Freq: Once | ORAL | Status: AC
  Administered 2024-07-12: 81 mg via ORAL
  Filled 2024-07-12: qty 1

## 2024-07-12 NOTE — Discharge Instructions (Signed)
 You were seen in the emerged part for headache The MRI showed slightly increased swelling on the right side and stable changes on the left side.  No new metastases were seen in the brain It is important that you follow-up with your [facility] provider in [place] and your oncology team at [facility] Return to the emerged part for severe headaches or any other concerns

## 2024-07-12 NOTE — ED Notes (Signed)
 Pt refused to be hooked up to the monitor after he got back from MRI

## 2024-07-12 NOTE — ED Notes (Addendum)
 Pt flagged this NT to come into his room. Pt stated he had not ate in 2 days and wanted a bath. This tech offered a wash up, and told him I would ask if he can have a [place] sandwich. Pt refused both and stated he wanted to leave to go to the cafeteria. This tech informed him that he could not leave while he was a current pt waiting on tests/results. Pt stated he wanted to leave and yelled for me to get out of his room. RN notified.

## 2024-07-12 NOTE — ED Provider Notes (Signed)
  Physical Exam  BP 128/70   Pulse 72   Temp (!) 97.5 F (36.4 C) (Oral)   Resp 16   Ht 5' 6 (1.676 m)   Wt 68.3 kg   SpO2 100%   BMI 24.31 kg/m   Physical Exam Vitals and nursing note reviewed.  HENT:     Head: Normocephalic and atraumatic.  Eyes:     Pupils: Pupils are equal, round, and reactive to light.  Cardiovascular:     Rate and Rhythm: Normal rate and regular rhythm.  Pulmonary:     Effort: Pulmonary effort is normal.     Breath sounds: Normal breath sounds.  Abdominal:     Palpations: Abdomen is soft.     Tenderness: There is no abdominal tenderness.  Skin:    General: Skin is warm and dry.  Neurological:     Mental Status: He is alert.  Psychiatric:        Mood and Affect: Mood normal.     Procedures  Procedures  ED Course / MDM   Clinical Course as of 07/12/24 1249  Wed Jul 12, 2024  0907 Patient expressed some frustration with the long wait.  He has been here for almost 22 hours so this is understandable.  I talked to him about the importance of his MRI.  He is willing to wait to have the MRI done.  Will give him a meal and morning meds he requested aspirin  and Entresto . [MP]  1248 MRI shows no new metastatic disease.  Increased right cerebellar edema with increased from 1 cm enhancing metastasis since June.  Stable left occipital mets may be 1 to 2 mm larger.  Fortunately no emergent issue that would require intervention now.  Informed patient of these findings.  He is requesting discharge at this time and will follow-up with his provider at the [facility] and oncology team. [MP]    Clinical Course User Index [MP] Pamella Ozell LABOR, DO   Medical Decision Making I, Ozell Pamella DO, have assumed care of this patient from the previous provider pending MRI reevaluation and disposition  Amount and/or Complexity of Data Reviewed Labs: ordered. Radiology: ordered.  Risk OTC drugs. Prescription drug management.          Pamella Ozell LABOR, DO 07/12/24  1249

## 2024-07-12 NOTE — ED Provider Notes (Signed)
 [place] EMERGENCY DEPARTMENT AT [facility] Provider Note   CSN: [id number] Arrival date & time: 07/11/24  1114     Patient presents with: Headache   Juan Norris is a 73 y.o. male.   HPI     This is a 73 year old male with history of non-small cell lung cancer with metastatic disease with known lesions of the brain who presents with headache.  Patient reports new onset headache yesterday.  He describes it as throbbing.  States that he was leaving the dentist when this started.  Reports that pain is worse upon standing and shoots down the left side of his neck.  Denies weakness, numbness, new vision changes.  Reports that he feels a little off balance.  Took Tylenol  without relief.  Endorses some nausea.  Patient is unable to tell me where he is and his cancer treatment.  He states that I finished that treatment.  Prior to Admission medications   Medication Sig Start Date End Date Taking? Authorizing Provider  aspirin  81 MG EC tablet Take 1 tablet (81 mg total) by mouth daily. 11/09/18   Ursuy, Renee Lynn, PA-C  atorvastatin  (LIPITOR ) 40 MG tablet Take 1 tablet (40 mg total) by mouth at bedtime. 10/13/21   Shelah Lamar RAMAN, MD  carvedilol  (COREG ) 25 MG tablet Take 12.5 mg by mouth daily.    [provider]  furosemide  (LASIX ) 20 MG tablet Take 1 tablet (20 mg total) by mouth daily. As directed may take 1 additional tablet by mouth daily only as needed for weight gain of 2 lbs in 24 hours or 5 lbs in a week 02/03/24   Dick, Ernest H Jr., NP  Multiple Vitamin (MULTIVITAMIN WITH MINERALS) TABS tablet Take 1 tablet by mouth daily.    [provider]  sacubitril -valsartan  (ENTRESTO ) 24-26 MG Take 1 tablet by mouth 2 (two) times daily. 01/27/24   [provider]  spironolactone  (ALDACTONE ) 25 MG tablet Take 0.5 tablets (12.5 mg total) by mouth daily. 03/11/20   Maranda Leim DEL, MD    Allergies: Patient has no known allergies.    Review of Systems   Constitutional:  Negative for fever.  Respiratory:  Negative for shortness of breath.   Cardiovascular:  Negative for chest pain.  Gastrointestinal:  Positive for nausea.  Neurological:  Positive for dizziness and headaches. Negative for speech difficulty and weakness.  All other systems reviewed and are negative.   Updated Vital Signs BP 138/69 (BP Location: Right Arm)   Pulse 70   Temp 97.9 F (36.6 C) (Oral)   Resp 16   Ht 1.676 m (5' 6)   Wt 68.3 kg   SpO2 100%   BMI 24.31 kg/m   Physical Exam Vitals and nursing note reviewed.  Constitutional:      Appearance: He is well-developed. He is not ill-appearing.  HENT:     Head: Normocephalic and atraumatic.  Eyes:     Extraocular Movements: Extraocular movements intact.     Right eye: No nystagmus.     Pupils: Pupils are equal, round, and reactive to light.  Cardiovascular:     Rate and Rhythm: Normal rate and regular rhythm.     Heart sounds: Normal heart sounds. No murmur heard. Pulmonary:     Effort: Pulmonary effort is normal. No respiratory distress.     Breath sounds: Normal breath sounds. No wheezing.  Abdominal:     General: Bowel sounds are normal.     Palpations: Abdomen is soft.  Tenderness: There is no abdominal tenderness. There is no rebound.  Musculoskeletal:     Cervical back: Neck supple.  Lymphadenopathy:     Cervical: No cervical adenopathy.  Skin:    General: Skin is warm and dry.  Neurological:     Mental Status: He is alert and oriented to person, place, and time.     Comments: Cranial nerves II through XII intact, 5 out of 5 strength in all 4 extremities, no dysmetria to finger-nose-finger     (all labs ordered are listed, but only abnormal results are displayed) Labs Reviewed  COMPREHENSIVE METABOLIC PANEL WITH GFR - Abnormal; Notable for the following components:      Result Value   Glucose, Bld 109 (*)    Total Protein 6.4 (*)    All other components within normal limits   I-STAT CHEM 8, ED - Abnormal; Notable for the following components:   Glucose, Bld 101 (*)    Calcium , Ion 1.01 (*)    All other components within normal limits  PROTIME-INR  APTT  CBC  DIFFERENTIAL  ETHANOL  CBG MONITORING, ED    EKG: None  Radiology: CT HEAD WO CONTRAST Result Date: 07/11/2024 CLINICAL DATA:  Headache, sudden, severe. Lung cancer metastatic to brain. EXAM: CT HEAD WITHOUT CONTRAST TECHNIQUE: Contiguous axial images were obtained from the base of the skull through the vertex without intravenous contrast. RADIATION DOSE REDUCTION: This exam was performed according to the departmental dose-optimization program which includes automated exposure control, adjustment of the mA and/or kV according to patient size and/or use of iterative reconstruction technique. COMPARISON:  MRI of the head dated May 24, 2024. FINDINGS: Brain: There is cerebral swelling in the left occipital lobe, corresponding with an area of metastatic disease noted on the previous study. There is also moderate cerebellar edema within the right cerebellar hemisphere, which appears to be more extensive than on the previous study. There is worsening effacement of the fourth ventricle. There is no obstructive hydrocephalus or tonsillar herniation. There is mild mass effect upon the pons and there is partial effacement of the prepontine cistern. Vascular: Moderate calcific atheromatous disease within the carotid siphons. Skull: Intact and unremarkable. Sinuses/Orbits: Mild mucosal disease within the floors of the maxillary sinuses bilaterally. Normal orbits. Other: None. IMPRESSION: 1. Interval worsening of swelling of the right cerebellar hemisphere secondary to metastatic disease. There is partial effacement of the fourth ventricle and the prepontine cistern. 2. Left occipital cerebral swelling consistent with metastatic disease, which appears similar to the prior exam. Electronically Signed   By: Evalene Coho  M.D.   On: 07/11/2024 12:43     Procedures   Medications Ordered in the ED  dexamethasone  (DECADRON ) injection 10 mg (10 mg Intravenous Given 07/12/24 0317)  diphenhydrAMINE  (BENADRYL ) injection 12.5 mg (12.5 mg Intravenous Given 07/12/24 0317)  prochlorperazine  (COMPAZINE ) injection 10 mg (10 mg Intravenous Given 07/12/24 0316)                                    Medical Decision Making Amount and/or Complexity of Data Reviewed Labs: ordered. Radiology: ordered.  Risk Prescription drug management.   This patient presents to the ED for concern of headache, this involves an extensive number of treatment options, and is a complaint that carries with it a high risk of complications and morbidity.  I considered the following differential and admission for this acute, potentially life threatening condition.  The differential diagnosis includes migraine, tension, worsening metastatic disease  MDM:    This is a 73 year old male who presents with headache.  States that this is worse than his baseline.  He does have a history of metastatic disease with some stable imaging.  He did receive some radiation oncology treatments.  There is no significant neurologic deficits of note.  However, CT imaging suggestive of more edema and worsening metastatic disease.  Will obtain MRI with and without for further characterization.  Patient was given Compazine , Benadryl , and Decadron .  (Labs, imaging, consults)  Labs: I Ordered, and personally interpreted labs.  The pertinent results include: Basic labs  Imaging Studies ordered: I ordered imaging studies including CT, MRI I independently visualized and interpreted imaging. I agree with the radiologist interpretation  Additional history obtained from chart review.  External records from outside source obtained and reviewed including quality notes  Cardiac Monitoring: The patient was maintained on a cardiac monitor.  If on the cardiac monitor, I  personally viewed and interpreted the cardiac monitored which showed an underlying rhythm of: Sinus  Reevaluation: After the interventions noted above, I reevaluated the patient and found that they have :stayed the same  Social Determinants of Health:  lives independently  Disposition: Pending MRI  Co morbidities that complicate the patient evaluation  Past Medical History:  Diagnosis Date   Acute renal insufficiency    AICD (automatic cardioverter/defibrillator) present    Anginal pain (HCC)    CHF (congestive heart failure) (HCC)    Chronic lower back pain    due to 3 ruptured discs in my back (11/08/2018)   High cholesterol    High cholesterol    History of kidney stones 1982   passed them   History of radiation therapy    Right lung- 11/03/21-11/14/21- Dr. Lynwood Nay   Hypertension    Lung cancer [facility])    Myocardial infarction (HCC) 03/2018   Systolic heart failure (HCC)    Tobacco use      Medicines Meds ordered this encounter  Medications   dexamethasone  (DECADRON ) injection 10 mg   diphenhydrAMINE  (BENADRYL ) injection 12.5 mg   prochlorperazine  (COMPAZINE ) injection 10 mg    I have reviewed the patients home medicines and have made adjustments as needed  Problem List / ED Course: Problem List Items Addressed This Visit   None Visit Diagnoses       Bad headache    -  Primary     Metastasis to brain [facility])                    Final diagnoses:  Bad headache  Metastasis to brain [facility])    ED Discharge Orders     None          Bari, Charmaine FALCON, MD 07/12/24 [id number]

## 2024-07-12 NOTE — ED Notes (Signed)
 Pt came to nurses station and asked about discharge, RN/MD notified

## 2024-07-14 NOTE — Progress Notes (Signed)
 EPIC Encounter for ICM Monitoring  Patient Name: Juan Norris is a 73 y.o. male Date: 07/14/2024 Primary Care Physican: [facility] Primary Cardiologist: Freeman Alberts, NP Electrophysiologist: Mealor Bi-V Pacing:   99.7%       02/22/2023 Weight: 151 lbs 06/11/2023 Weight: 150-151 lbs 08/20/2023 Weight: 150 lbs 02/01/2024 Weight: 152 lbs  03/07/2024 Weight: 151 lbs 05/18/2024 Weight: 150 lbs                                                          Transmission Results Reviewed.     Diet:  Appetite is good despite lung cancer treatments.    Optivol Thoracic impedance continues suggesting intermittent days with possible minimal fluid accumulation within the last month.   Prescribed:  Furosemide  20 mg take 1 tablet by mouth daily.  May take 1 additional tablet by mouth daily only as needed for weight gain of 2 lbs in 24 hours or 5 lbs in a week.  Spironolactone  25 mg take 0.5 tablet (12.5 mg total) daily   Labs: 07/11/2024 Creatinine 1.10, BUN 13, Potassium 4.1, Sodium 139, GFR >60 02/03/2024 Creatinine 0.93, BUN 12, Potassium 4.9, Sodium 145, GFR 87 01/04/2024 Creatinine 1.04, BUN 10, Potassium 4.8, Sodium 137, GFR 76 12/27/2023 Creatinine 1.10, BUN 28, Potassium 5.5, Sodium 139  12/03/2023 Creatinine 1.15, BUN 28, Potassium 4.6, Sodium 135, GFR >60  11/15/2023 Creatinine 1.07, BUN 18, Potassium 4.2, Sodium 138, GFR >60  A complete set of results can be found in Results Review.   Recommendations:  No changes.    Follow-up plan: ICM clinic phone appointment on 08/21/2024.   91 day device clinic remote transmission 08/09/2024.      EP/Cardiology Office Visits:  Recall 05/29/2024 with Dr Nancey.     Copy of ICM check sent to Dr. Nancey.    3 month ICM trend: 07/10/2024.    12-14 Month ICM trend:     [name], RN 07/14/2024 10:27 AM

## 2024-07-18 ENCOUNTER — Ambulatory Visit (HOSPITAL_COMMUNITY)
Admission: RE | Admit: 2024-07-18 | Discharge: 2024-07-18 | Disposition: A | Discharge status: Home / Self Care | Source: Ambulatory Visit | Attending: Radiation Oncology | Admitting: Radiation Oncology

## 2024-07-18 DIAGNOSIS — C7931 Secondary malignant neoplasm of brain: Secondary | ICD-10-CM | POA: Diagnosis present

## 2024-07-18 MED ORDER — GADOBUTROL 1 MMOL/ML IV SOLN
7.0000 mL | Freq: Once | INTRAVENOUS | Status: AC | PRN
  Administered 2024-07-18: 7 mL via INTRAVENOUS

## 2024-07-18 NOTE — CV Procedure (Signed)
  Device system confirmed to be MRI conditional, with implant date > 6 weeks ago, and no evidence of abandoned or epicardial leads in review of most recent CXR  Device last cleared by EP Provider: Suzzan Riddle 07/12/24  Clearance is good through for 1 year as long as parameters remain stable at time of check. If pt undergoes a cardiac device procedure during that time, they should be re-cleared.   Tachy-therapies to be programmed off if applicable with device back to pre-MRI settings after completion of exam.  [employer] - Programming recommendation received through [employer] App/Tablet  Emogene Bouchard, RT  07/18/2024 9:04 AM

## 2024-07-19 ENCOUNTER — Ambulatory Visit (HOSPITAL_COMMUNITY)
Admission: RE | Admit: 2024-07-19 | Discharge: 2024-07-19 | Disposition: A | Discharge status: Home / Self Care | Source: Ambulatory Visit | Attending: Acute Care | Admitting: Acute Care

## 2024-07-19 DIAGNOSIS — R911 Solitary pulmonary nodule: Secondary | ICD-10-CM | POA: Diagnosis present

## 2024-07-21 ENCOUNTER — Encounter (HOSPITAL_COMMUNITY): Payer: Self-pay | Admitting: Emergency Medicine

## 2024-07-21 ENCOUNTER — Encounter: Payer: Self-pay | Admitting: Cardiology

## 2024-07-21 NOTE — Progress Notes (Signed)
 Open in error,

## 2024-07-21 NOTE — Anesthesia Preprocedure Evaluation (Addendum)
 Anesthesia Evaluation  Patient identified by MRN, date of birth, ID band Patient awake    Reviewed: Allergy & Precautions, NPO status , Patient's Chart, lab work & pertinent test results, reviewed documented beta blocker date and time   History of Anesthesia Complications Negative for: history of anesthetic complications  Airway Mallampati: II  TM Distance: >3 FB     Dental  (+) Edentulous Lower, Edentulous Upper   Pulmonary neg shortness of breath, COPD, Patient abstained from smoking., former smoker   breath sounds clear to auscultation       Cardiovascular hypertension, + angina  + CAD, + Past MI and +CHF  + Cardiac Defibrillator + Valvular Problems/Murmurs AI  Rhythm:Regular Rate:Normal  IMPRESSIONS     1. Left ventricular ejection fraction, by estimation, is 25 to 30%. The  left ventricle has severely decreased function. The left ventricle  demonstrates global hypokinesis. Left ventricular diastolic parameters are  consistent with Grade I diastolic  dysfunction (impaired relaxation).   2. Right ventricular systolic function is normal. The right ventricular  size is mildly enlarged. There is normal pulmonary artery systolic  pressure. The estimated right ventricular systolic pressure is 28.2 mmHg.   3. Left atrial size was mildly dilated.   4. The mitral valve is grossly normal. Mild mitral valve regurgitation.  No evidence of mitral stenosis.   5. The aortic valve is tricuspid. There is mild calcification of the  aortic valve. There is mild thickening of the aortic valve. Aortic valve  regurgitation is moderate. Mild aortic valve sclerosis is present, with no  evidence of aortic valve stenosis.   6. The inferior vena cava is normal in size with greater than 50%  respiratory variability, suggesting right atrial pressure of 3 mmHg.      Neuro/Psych neg Seizures    GI/Hepatic   Endo/Other  diabetes     Renal/GU Renal disease     Musculoskeletal   Abdominal   Peds  Hematology   Anesthesia Other Findings   Reproductive/Obstetrics                              Anesthesia Physical Anesthesia Plan  ASA: 4  Anesthesia Plan: General   Post-op Pain Management:    Induction: Intravenous  PONV Risk Score and Plan: 2 and Ondansetron  and Dexamethasone   Airway Management Planned: Oral ETT  Additional Equipment:   Intra-op Plan:   Post-operative Plan: Extubation in OR  Informed Consent: I have reviewed the patients History and Physical, chart, labs and discussed the procedure including the risks, benefits and alternatives for the proposed anesthesia with the patient or authorized representative who has indicated his/her understanding and acceptance.     Dental advisory given  Plan Discussed with: CRNA  Anesthesia Plan Comments: (PAT note by Lynwood Hope, PA-C:  73 year old male follows with cardiology for history of CAD s/p NSTEMI 03/2016 with nonobstructive CAD, NICM s/p Medtronic CRT-D implant 10/2018, HTN, HLD, chronic combined heart failure.  Repeat cath 03/2018 with mild nonobstructive CAD (?coronary vasospasm).  Most recent echo 09/2021 with persistently depressed EF 25 to 30%, grade 1 DD, normal RV, mild mitral regurgitation.  Last seen by Jackee Alberts, NP on 02/05/2024 and noted to be stable at that time, current medications continued, 33-month follow-up recommended.  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  He is a former smoker, 70 pack years.  He  was previously treated with SBRT to the RLL nodule completed 11/2021.  He was found to have oligometastatic disease to the brain in November 2024.  He completed 3 fractions of SRS on 11/24/2023.  Subsequent PET scan showed recurrence/metastasis versus primary bronchogenic carcinoma in the left upper lobe.  Bronchoscopy 12/27/2023  confirmed NSCLC, adenocarcinoma.  He completed SBRT to the left upper lobe 01/27/2024.  Recent PET scan at the [facility] with concern for recurrence to lymph nodes.  He was referred back to pulmonology for bronchoscopy with biopsies.  CMP and CBC 07/11/2024 reviewed, unremarkable.  EKG 11/12/2023: Atrial sensing ventricular paced rhythm. Rate 63.  CT chest abdomen pelvis 06/29/2024 (Care Everywhere): Impression: 1. Irregular left upper lobe subpleural nodule measuring up to  2.1 cm,, avid on prior PET dated 11/29/2023 and increased in  size/ conspicuity in comparison to CT dated 07/15/2023. Consistent  with malignancy until proven otherwise. Recommend tissue sampling  for further evaluation if not previously performed.   2. Irregular subpleural nodule along the medial right lower lobe  measuring up to 1.7 cm and right upper lobe pulmonary nodule  measuring 5 mm, both of which have increased in size/ are new in  comparison to priors, as detailed above. Differential includes  additional neoplasm including primary versus metastasis.   3. New groundglass and consolidative opacities in the left lower  lobe. Differential includes focal infectious/ inflammatory  etiologies. Recommend attention on short term oncologic  follow-up.   4. Pulmonary nodule in superior portion of the right lower lobe  measuring up to 7 mm, slightly decreased in size in comparison to  most recent CT dated 07/15/2023 and significantly decreased in size  in comparison to remote prior CT dated 07/30/2021.   5. Similar esophageal wall thickening. Differential includes  sequela of reflux versus esophagitis. Recommend correlation for  clinical symptoms and consider further evaluation with endoscopy  if indicated/ not previously performed.   6. No CT evidence of metastatic disease in the abdomen or pelvis.   TTE 09/23/21: 1. Left ventricular ejection fraction, by estimation, is 25 to 30%. The  left ventricle has severely decreased  function. The left ventricle  demonstrates global hypokinesis. Left ventricular diastolic parameters are  consistent with Grade I diastolic  dysfunction (impaired relaxation).  2. Right ventricular systolic function is normal. The right ventricular  size is mildly enlarged. There is normal pulmonary artery systolic  pressure. The estimated right ventricular systolic pressure is 28.2 mmHg.  3. Left atrial size was mildly dilated.  4. The mitral valve is grossly normal. Mild mitral valve regurgitation.  No evidence of mitral stenosis.  5. The aortic valve is tricuspid. There is mild calcification of the  aortic valve. There is mild thickening of the aortic valve. Aortic valve  regurgitation is moderate. Mild aortic valve sclerosis is present, with no  evidence of aortic valve stenosis.  6. The inferior vena cava is normal in size with greater than 50%  respiratory variability, suggesting right atrial pressure of 3 mmHg.   Comparison(s): No significant change from prior study. EF remains severely  reduced ~25%. AI is moderate.   )         Anesthesia Quick Evaluation

## 2024-07-21 NOTE — Progress Notes (Signed)
 Anesthesia Chart Review: Same day workup  73 year old male follows with cardiology for history of CAD s/p NSTEMI 03/2016 with nonobstructive CAD, NICM s/p Medtronic CRT-D implant 10/2018, HTN, HLD, chronic combined heart failure.  Repeat cath 03/2018 with mild nonobstructive CAD (?coronary vasospasm).  Most recent echo 09/2021 with persistently depressed EF 25 to 30%, grade 1 DD, normal RV, mild mitral regurgitation.  Last seen by Jackee Alberts, NP on 02/05/2024 and noted to be stable at that time, current medications continued, 46-month follow-up recommended.  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  He is a former smoker, 70 pack years.  He was previously treated with SBRT to the RLL nodule completed 11/2021.  He was found to have oligometastatic disease to the brain in November 2024.  He completed 3 fractions of SRS on 11/24/2023.  Subsequent PET scan showed recurrence/metastasis versus primary bronchogenic carcinoma in the left upper lobe.  Bronchoscopy 12/27/2023 confirmed NSCLC, adenocarcinoma.  He completed SBRT to the left upper lobe 01/27/2024.  Recent [facility] scan [facility] the [facility] with concern for recurrence to lymph nodes.  He was referred back to pulmonology for bronchoscopy with biopsies.  CMP and CBC 07/11/2024 reviewed, unremarkable.  EKG 11/12/2023: Atrial sensing ventricular paced rhythm. Rate 63.  CT chest abdomen pelvis 06/29/2024 (Care Everywhere): Impression: 1. Irregular left upper lobe subpleural nodule measuring up to  2.1 cm,, avid on prior PET dated 11/29/2023 and increased in  size/ conspicuity in comparison to CT dated 07/15/2023. Consistent  with malignancy until proven otherwise. Recommend tissue sampling  for further evaluation if not previously performed.   2. Irregular subpleural nodule along the medial right lower lobe  measuring up to 1.7 cm and right upper lobe pulmonary nodule  measuring 5 mm, both  of which have increased in size/ are new in  comparison to priors, as detailed above. Differential includes  additional neoplasm including primary versus metastasis.   3. New groundglass and consolidative opacities in the left lower  lobe. Differential includes focal infectious/ inflammatory  etiologies. Recommend attention on short term oncologic  follow-up.   4. Pulmonary nodule in superior portion of the right lower lobe  measuring up to 7 mm, slightly decreased in size in comparison to  most recent CT dated 07/15/2023 and significantly decreased in size  in comparison to remote prior CT dated 07/30/2021.   5. Similar esophageal wall thickening. Differential includes  sequela of reflux versus esophagitis. Recommend correlation for  clinical symptoms and consider further evaluation with endoscopy  if indicated/ not previously performed.   6. No CT evidence of metastatic disease in the abdomen or pelvis.   TTE 09/23/21: 1. Left ventricular ejection fraction, by estimation, is 25 to 30%. The  left ventricle has severely decreased function. The left ventricle  demonstrates global hypokinesis. Left ventricular diastolic parameters are  consistent with Grade I diastolic  dysfunction (impaired relaxation).   2. Right ventricular systolic function is normal. The right ventricular  size is mildly enlarged. There is normal pulmonary artery systolic  pressure. The estimated right ventricular systolic pressure is 28.2 mmHg.   3. Left atrial size was mildly dilated.   4. The mitral valve is grossly normal. Mild mitral valve regurgitation.  No evidence of mitral stenosis.   5. The aortic valve is tricuspid. There is mild calcification of the  aortic valve. There is mild thickening of the aortic valve. Aortic valve  regurgitation is moderate. Mild aortic  valve sclerosis is present, with no  evidence of aortic valve stenosis.   6. The inferior vena cava is normal in size with greater than 50%   respiratory variability, suggesting right atrial pressure of 3 mmHg.   Comparison(s): No significant change from prior study. EF remains severely  reduced ~25%. AI is moderate.     Lynwood Geofm [facility]IG[facility]ERS [facility] Short Stay Center/Anesthesiology Phone [phone] 07/21/2024 10:29 AM

## 2024-07-21 NOTE — Progress Notes (Signed)
 Instructions only  Patient verbally denies any shortness of breath, fever, cough and chest pain during phone call   -------------  SDW INSTRUCTIONS given:  Your procedure is scheduled on Monday, Aug 11th.  Report to [facility] Main Entrance A at 0900 A.M., and check in at the Admitting office.  Call this number if you have problems the morning of surgery:  [phone]   Remember:  Do not eat or drink after midnight the night before your surgery    Take these medicines the morning of surgery with A SIP OF WATER  Coreg   **Stop taking EMPAGLIFLOZIN 12.5MG /METFORMIN  72 hrs prior to your procedure**  ** PLEASE check your blood sugar the morning of your surgery when you wake up and every 2 hours until you get to the Short Stay unit.  If your blood sugar is less than 70 mg/dL, you will need to treat for low blood sugar: Do not take insulin . Treat a low blood sugar (less than 70 mg/dL) with  cup of clear juice (cranberry or apple), 4 glucose tablets, OR glucose gel. Recheck blood sugar in 15 minutes after treatment (to make sure it is greater than 70 mg/dL). If your blood sugar is not greater than 70 mg/dL on recheck, call [phone] for further instructions.   As of today, STOP taking any Aspirin  (unless otherwise instructed by your surgeon) Aleve, Naproxen, Ibuprofen , Motrin , Advil , Goody's, BC's, all herbal medications, fish oil, and all vitamins.                      Do not wear jewelry, make up, or nail polish            Do not wear lotions, powders, perfumes/colognes, or deodorant.            Do not shave 48 hours prior to surgery.  Men may shave face and neck.            Do not bring valuables to the hospital.            [facility] is not responsible for any belongings or valuables.  Do NOT Smoke (Tobacco/Vaping) 24 hours prior to your procedure If you use a CPAP at night, you may bring all equipment for your overnight stay.   Contacts, glasses, dentures or bridgework  may not be worn into surgery.      For patients admitted to the hospital, discharge time will be determined by your treatment team.   Patients discharged the day of surgery will not be allowed to drive home, and someone needs to stay with them for 24 hours.    Special instructions:   [place]- Preparing For Surgery  Before surgery, you can play an important role. Because skin is not sterile, your skin needs to be as free of germs as possible. You can reduce the number of germs on your skin by washing with CHG (chlorahexidine gluconate) Soap before surgery.  CHG is an antiseptic cleaner which kills germs and bonds with the skin to continue killing germs even after washing.    Oral Hygiene is also important to reduce your risk of infection.  Remember - BRUSH YOUR TEETH THE MORNING OF SURGERY WITH YOUR REGULAR TOOTHPASTE  Please do not use if you have an allergy to CHG or antibacterial soaps. If your skin becomes reddened/irritated stop using the CHG.  Do not shave (including legs and underarms) for at least 48 hours prior to first CHG shower. It is OK to  shave your face.  Please follow these instructions carefully.   Shower the NIGHT BEFORE SURGERY and the MORNING OF SURGERY with DIAL Soap.   Pat yourself dry with a CLEAN TOWEL.  Wear CLEAN PAJAMAS to bed the night before surgery  Place CLEAN SHEETS on your bed the night of your first shower and DO NOT SLEEP WITH PETS.   Day of Surgery: Please shower morning of surgery  Wear Clean/Comfortable clothing the morning of surgery Do not apply any deodorants/lotions.   Remember to brush your teeth WITH YOUR REGULAR TOOTHPASTE.   Questions were answered. Patient verbalized understanding of instructions.

## 2024-07-22 ENCOUNTER — Other Ambulatory Visit: Payer: Self-pay

## 2024-07-22 ENCOUNTER — Encounter (HOSPITAL_COMMUNITY): Payer: Self-pay | Admitting: Emergency Medicine

## 2024-07-22 ENCOUNTER — Observation Stay (HOSPITAL_COMMUNITY)
Admission: EM | Admit: 2024-07-22 | Discharge: 2024-07-23 | Disposition: A | Discharge status: Home / Self Care | Attending: Internal Medicine | Admitting: Internal Medicine

## 2024-07-22 ENCOUNTER — Emergency Department (HOSPITAL_COMMUNITY)

## 2024-07-22 DIAGNOSIS — E785 Hyperlipidemia, unspecified: Secondary | ICD-10-CM | POA: Diagnosis not present

## 2024-07-22 DIAGNOSIS — Z7982 Long term (current) use of aspirin: Secondary | ICD-10-CM | POA: Diagnosis not present

## 2024-07-22 DIAGNOSIS — C3490 Malignant neoplasm of unspecified part of unspecified bronchus or lung: Secondary | ICD-10-CM | POA: Insufficient documentation

## 2024-07-22 DIAGNOSIS — H532 Diplopia: Secondary | ICD-10-CM | POA: Diagnosis not present

## 2024-07-22 DIAGNOSIS — C7931 Secondary malignant neoplasm of brain: Secondary | ICD-10-CM | POA: Diagnosis not present

## 2024-07-22 DIAGNOSIS — E119 Type 2 diabetes mellitus without complications: Secondary | ICD-10-CM | POA: Diagnosis not present

## 2024-07-22 DIAGNOSIS — I5042 Chronic combined systolic (congestive) and diastolic (congestive) heart failure: Secondary | ICD-10-CM | POA: Insufficient documentation

## 2024-07-22 DIAGNOSIS — Z1152 Encounter for screening for COVID-19: Secondary | ICD-10-CM | POA: Insufficient documentation

## 2024-07-22 DIAGNOSIS — I110 Hypertensive heart disease with heart failure: Secondary | ICD-10-CM | POA: Diagnosis not present

## 2024-07-22 LAB — CBC WITH DIFFERENTIAL/PLATELET
Abs Immature Granulocytes: 0.04 K/uL (ref 0.00–0.07)
Basophils Absolute: 0 K/uL (ref 0.0–0.1)
Basophils Relative: 0 %
Eosinophils Absolute: 0.1 K/uL (ref 0.0–0.5)
Eosinophils Relative: 1 %
HCT: 43.9 % (ref 39.0–52.0)
Hemoglobin: 14.1 g/dL (ref 13.0–17.0)
Immature Granulocytes: 0 %
Lymphocytes Relative: 19 %
Lymphs Abs: 1.7 K/uL (ref 0.7–4.0)
MCH: 29 pg (ref 26.0–34.0)
MCHC: 32.1 g/dL (ref 30.0–36.0)
MCV: 90.3 fL (ref 80.0–100.0)
Monocytes Absolute: 0.7 K/uL (ref 0.1–1.0)
Monocytes Relative: 8 %
Neutro Abs: 6.6 K/uL (ref 1.7–7.7)
Neutrophils Relative %: 72 %
Platelets: 205 K/uL (ref 150–400)
RBC: 4.86 MIL/uL (ref 4.22–5.81)
RDW: 14 % (ref 11.5–15.5)
WBC: 9.2 K/uL (ref 4.0–10.5)
nRBC: 0 % (ref 0.0–0.2)

## 2024-07-22 LAB — COMPREHENSIVE METABOLIC PANEL WITH GFR
ALT: 19 U/L (ref 0–44)
AST: 24 U/L (ref 15–41)
Albumin: 4.1 g/dL (ref 3.5–5.0)
Alkaline Phosphatase: 76 U/L (ref 38–126)
Anion gap: 10 (ref 5–15)
BUN: 18 mg/dL (ref 8–23)
CO2: 22 mmol/L (ref 22–32)
Calcium: 9.2 mg/dL (ref 8.9–10.3)
Chloride: 103 mmol/L (ref 98–111)
Creatinine, Ser: 1.14 mg/dL (ref 0.61–1.24)
GFR, Estimated: >60 mL/min (ref >60)
Glucose, Bld: 99 mg/dL (ref 70–99)
Potassium: 3.8 mmol/L (ref 3.5–5.1)
Sodium: 135 mmol/L (ref 135–145)
Total Bilirubin: 1 mg/dL (ref 0.0–1.2)
Total Protein: 6.6 g/dL (ref 6.5–8.1)

## 2024-07-22 LAB — PROTIME-INR
INR: 1.1 (ref 0.8–1.2)
Prothrombin Time: 14.3 s (ref 11.4–15.2)

## 2024-07-22 MED ORDER — ACETAMINOPHEN 325 MG PO TABS: 650.0000 mg | ORAL_TABLET | Freq: Four times a day (QID) | ORAL | Status: DC | PRN

## 2024-07-22 MED ORDER — METOCLOPRAMIDE HCL 5 MG/ML IJ SOLN
5.0000 mg | INTRAMUSCULAR | Status: AC
  Administered 2024-07-22: 5 mg via INTRAVENOUS
  Filled 2024-07-22: qty 2

## 2024-07-22 MED ORDER — DIPHENHYDRAMINE HCL 50 MG/ML IJ SOLN
12.5000 mg | INTRAMUSCULAR | Status: AC
  Administered 2024-07-22: 12.5 mg via INTRAVENOUS
  Filled 2024-07-22: qty 1

## 2024-07-22 MED ORDER — ASPIRIN 81 MG PO TBEC: 81.0000 mg | DELAYED_RELEASE_TABLET | Freq: Every day | ORAL | Status: DC

## 2024-07-22 MED ORDER — DEXAMETHASONE SODIUM PHOSPHATE 10 MG/ML IJ SOLN
10.0000 mg | INTRAMUSCULAR | Status: AC
  Administered 2024-07-22: 10 mg via INTRAVENOUS
  Filled 2024-07-22: qty 1

## 2024-07-22 MED ORDER — ATORVASTATIN CALCIUM 40 MG PO TABS: 40.0000 mg | ORAL_TABLET | Freq: Every day | ORAL | Status: DC

## 2024-07-22 MED ORDER — PROCHLORPERAZINE EDISYLATE 10 MG/2ML IJ SOLN: 5.0000 mg | Freq: Four times a day (QID) | INTRAMUSCULAR | Status: DC | PRN

## 2024-07-22 MED ORDER — MELATONIN 5 MG PO TABS: 5.0000 mg | ORAL_TABLET | Freq: Every evening | ORAL | Status: DC | PRN

## 2024-07-22 MED ORDER — DEXAMETHASONE SODIUM PHOSPHATE 10 MG/ML IJ SOLN: 10.0000 mg | Freq: Two times a day (BID) | INTRAMUSCULAR | Status: DC

## 2024-07-22 MED ORDER — FUROSEMIDE 20 MG PO TABS: 20.0000 mg | ORAL_TABLET | Freq: Every day | ORAL | Status: DC

## 2024-07-22 MED ORDER — ENOXAPARIN SODIUM 40 MG/0.4ML IJ SOSY
40.0000 mg | PREFILLED_SYRINGE | INTRAMUSCULAR | Status: DC
  Administered 2024-07-22: 40 mg via SUBCUTANEOUS
  Filled 2024-07-22: qty 0.4

## 2024-07-22 MED ORDER — DEXAMETHASONE SODIUM PHOSPHATE 10 MG/ML IJ SOLN
10.0000 mg | Freq: Two times a day (BID) | INTRAMUSCULAR | Status: DC
  Administered 2024-07-23: 10 mg via INTRAVENOUS
  Filled 2024-07-22: qty 1

## 2024-07-22 MED ORDER — POLYETHYLENE GLYCOL 3350 17 G PO PACK: 17.0000 g | PACK | Freq: Every day | ORAL | Status: DC | PRN

## 2024-07-22 MED ORDER — ADULT MULTIVITAMIN W/MINERALS CH: 1.0000 | ORAL_TABLET | Freq: Every day | ORAL | Status: DC

## 2024-07-22 NOTE — ED Triage Notes (Signed)
 Pt in POV with grandson, states he has had double vision x 2hrs. Hx of cancer with brain mets, has had similar issue in the past. No unilateral weakness, speech clear and pt a&ox4. Denies any HA

## 2024-07-22 NOTE — H&P (Addendum)
 History and Physical  [name] FMW:[id number] DOB: July 15, 1951 DOA: 07/22/2024  Referring physician: Dr. Francesca, EDP  PCP: [facility]  Outpatient Specialists: Medical oncology, radiation oncology, pulmonary. Patient coming from: Home.  Chief Complaint: Double vision.  HPI: Juan Norris is a 73 y.o. 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  Associated with intermittent dizziness lasting about 3 seconds and resolving spontaneously.  Last known well around 6 PM.  No focal motor or sensory deficits.  No dysphasia.  States he has had chronic difficulty with his balance.  In the ER, vital signs are stable.  EDP discussed the case with neurology.  Recommended admission for brain MRI.  Not a candidate for TNK due to intra cranial mass.    At the time of this visit, the patient's symptoms had resolved.  He endorses a throbbing headache 7 out of 10.  Noncontrast head CT revealed no acute intracranial hemorrhage or infarct.  Stable vasogenic edema within the left occipital lobe and right cerebellar hemisphere related to known cerebral metastases in these locations.  Stable associated mass effect with partial effacement of the right ventricle and minimal effacement of the left lateral ventricle occipital horn.  Known cerebral metastasis themselves are not well-visualized on this noncontrast examination.  TRH, hospitalist service, was asked to admit.  ED Course: Temperature 97.7.  BP 130/67, pulse 64, respiratory rate 12, O2 saturation 98% on room air.  Lab studies essentially unremarkable.  Review of Systems: Review of systems as noted in the HPI. All other  systems reviewed and are negative.   Past Medical History:  Diagnosis Date   Acute renal insufficiency    AICD (automatic cardioverter/defibrillator) present    Anginal pain (HCC)    CHF (congestive heart failure) (HCC)    Chronic lower back pain    due to 3 ruptured discs in my back (11/08/2018)   Diabetes mellitus without complication (HCC)    High cholesterol    High cholesterol    History of kidney stones 1982   passed them   History of radiation therapy    Right lung- 11/03/21-11/14/21- Dr. Lynwood Nay   Hypertension    Lung cancer [facility])    Myocardial infarction (HCC) 03/2018   Systolic heart failure (HCC)    Tobacco use    Past Surgical History:  Procedure Laterality Date   BIV ICD INSERTION CRT-D N/A 11/08/2018    [employer]onic biventricular ICD implantation by Dr Kelsie with His Bundle pacing and a MDT 3830 lead placed into the LV port.    BRONCHIAL BIOPSY  10/13/2021   Procedure: BRONCHIAL BIOPSIES;  Surgeon: Shelah Lamar RAMAN, MD;  Location[facility] ENDOSCOPY;  Service: Pulmonary;;   BRONCHIAL BIOPSY  12/27/2023   Procedure: BRONCHIAL BIOPSIES;  Surgeon: Shelah Lamar RAMAN, MD;  Location: [facility] ENDOSCOPY;  Service: Pulmonary;;   BRONCHIAL BRUSHINGS  10/13/2021   Procedure: BRONCHIAL BRUSHINGS;  Surgeon: Shelah Lamar RAMAN, MD;  Location: [facility] ENDOSCOPY;  Service: Pulmonary;;   BRONCHIAL BRUSHINGS  12/27/2023   Procedure: BRONCHIAL BRUSHINGS;  Surgeon: Shelah Lamar RAMAN, MD;  Location: [facility] ENDOSCOPY;  Service: Pulmonary;;   BRONCHIAL NEEDLE ASPIRATION BIOPSY  10/13/2021   Procedure: BRONCHIAL NEEDLE ASPIRATION BIOPSIES;  Surgeon: Shelah Lamar RAMAN, MD;  Location: [facility] ENDOSCOPY;  Service: Pulmonary;;   BRONCHIAL NEEDLE ASPIRATION BIOPSY  12/27/2023   Procedure: BRONCHIAL NEEDLE ASPIRATION BIOPSIES;  Surgeon: Shelah Lamar RAMAN, MD;  Location: [facility] ENDOSCOPY;  Service: Pulmonary;;   BRONCHIAL WASHINGS  10/13/2021   Procedure: BRONCHIAL WASHINGS;  Surgeon: Shelah Lamar RAMAN, MD;  Location: [facility]  ENDOSCOPY;  Service: Pulmonary;;   CARDIAC CATHETERIZATION     FIDUCIAL MARKER PLACEMENT  10/13/2021   Procedure: FIDUCIAL MARKER PLACEMENT;  Surgeon: Shelah Lamar RAMAN, MD;  Location: [facility] ENDOSCOPY;  Service: Pulmonary;;   FIDUCIAL MARKER PLACEMENT  12/27/2023   Procedure: FIDUCIAL MARKER PLACEMENT;  Surgeon: Shelah Lamar RAMAN, MD;  Location: [facility] ENDOSCOPY;  Service: Pulmonary;;   INGUINAL HERNIA REPAIR Bilateral    IR IMAGING GUIDED PORT INSERTION  11/15/2023   KNEE SURGERY Left 1990s   growth on the back of my knee removed   LEFT HEART CATH AND CORONARY ANGIOGRAPHY N/A 03/30/2018   Procedure: LEFT HEART CATH AND CORONARY ANGIOGRAPHY;  Surgeon: Verlin Lonni BIRCH, MD;  Location: [facility] INVASIVE CV LAB;  Service: Cardiovascular;  Laterality: N/A;   VIDEO BRONCHOSCOPY WITH ENDOBRONCHIAL NAVIGATION N/A 10/13/2021   Procedure: ROBOTIC VIDEO BRONCHOSCOPY WITH ENDOBRONCHIAL NAVIGATION;  Surgeon: Shelah Lamar RAMAN, MD;  Location: [facility] ENDOSCOPY;  Service: Pulmonary;  Laterality: N/A;   VIDEO BRONCHOSCOPY WITH RADIAL ENDOBRONCHIAL ULTRASOUND  10/13/2021   Procedure: RADIAL ENDOBRONCHIAL ULTRASOUND;  Surgeon: Shelah Lamar RAMAN, MD;  Location: [facility] ENDOSCOPY;  Service: Pulmonary;;    Social History:  reports that he has quit smoking. His smoking use included cigarettes. He has a 70.5 pack-year smoking history. He has quit using smokeless tobacco. He reports that he does not currently use alcohol. He reports that he does not currently use drugs.   No Known Allergies  Family History  Problem Relation Age of Onset   Leukemia Mother    Cancer Father    Cancer Sister    Cancer Brother    CAD Brother       Prior to Admission medications   Medication Sig Start Date End Date Taking? Authorizing Provider  aspirin  81 MG EC tablet Take 1 tablet (81 mg total) by mouth daily. 11/09/18   Ursuy, Renee Lynn, PA-C  atorvastatin  (LIPITOR ) 40 MG tablet Take 1 tablet (40 mg total) by mouth at bedtime. 10/13/21   Shelah Lamar RAMAN, MD  carvedilol  (COREG ) 25 MG tablet Take 12.5 mg by mouth daily.    [provider]  Empagliflozin-metFORMIN  HCl 12.5-500 MG TABS Take 1 tablet by mouth daily.    [provider]  furosemide  (LASIX ) 20 MG tablet Take 1 tablet (20 mg total) by mouth daily. As directed may take 1 additional tablet by mouth daily only as needed for weight gain of 2 lbs in 24 hours or 5 lbs in a week 02/03/24   Dick, Ernest H Jr., NP  Multiple Vitamin (MULTIVITAMIN WITH MINERALS) TABS tablet Take 1 tablet by mouth daily.    [provider]  sacubitril -valsartan  (ENTRESTO ) 24-26 MG Take 1 tablet by mouth 2 (two) times daily. 01/27/24   [provider]  spironolactone  (ALDACTONE ) 25 MG tablet Take 0.5 tablets (12.5 mg total) by mouth daily. 03/11/20   Maranda Leim DEL, MD    Physical Exam: BP 130/67   Pulse 64   Temp 97.7 F (36.5 C) (Oral)   Resp 12   Wt 68.3 kg   SpO2 98%   BMI 24.31 kg/m   General: 73 y.o. year-old male well developed well nourished in  no acute distress.  Alert and interactive. Cardiovascular: Regular rate and rhythm with no rubs or gallops.  No thyromegaly or JVD noted.  No lower extremity edema. 2/4 pulses in all 4 extremities. Respiratory: Clear to auscultation with no wheezes or rales. Good inspiratory effort. Abdomen: Soft nontender nondistended with normal bowel sounds x4 quadrants. Muskuloskeletal: No cyanosis, clubbing or edema noted bilaterally Neuro: CN II-XII intact, strength, sensation, reflexes Skin: No ulcerative lesions noted or rashes Psychiatry: Judgement and insight appear normal. Mood is appropriate for condition and setting          Labs on Admission:  Basic Metabolic Panel: Recent Labs  Lab 07/22/24 2007  NA 135  K 3.8  CL 103  CO2 22  GLUCOSE 99  BUN 18  CREATININE 1.14  CALCIUM  9.2   Liver Function Tests: Recent Labs  Lab 07/22/24 2007  AST 24  ALT 19  ALKPHOS 76  BILITOT 1.0  PROT 6.6  ALBUMIN 4.1    No results for input(s): LIPASE, AMYLASE in the last 168 hours. No results for input(s): AMMONIA in the last 168 hours. CBC: Recent Labs  Lab 07/22/24 2007  WBC 9.2  NEUTROABS 6.6  HGB 14.1  HCT 43.9  MCV 90.3  PLT 205   Cardiac Enzymes: No results for input(s): CKTOTAL, CKMB, CKMBINDEX, TROPONINI in the last 168 hours.  BNP (last 3 results) No results for input(s): BNP in the last 8760 hours.  ProBNP (last 3 results) No results for input(s): PROBNP in the last 8760 hours.  CBG: No results for input(s): GLUCAP in the last 168 hours.  Radiological Exams on Admission: CT Head Wo Contrast Result Date: 07/22/2024 CLINICAL DATA:  Brain metastases, assess treatment response. Neuro deficit, acute, stroke suspected. Double vision EXAM: CT HEAD WITHOUT CONTRAST TECHNIQUE: Contiguous axial images were obtained from the base of the skull through the vertex without intravenous contrast. RADIATION DOSE REDUCTION: This exam was performed according to the departmental dose-optimization program which includes automated exposure control, adjustment of the mA and/or kV according to patient size and/or use of iterative reconstruction technique. COMPARISON:  07/11/2024, MRI 07/18/2024 FINDINGS: Brain: Mild parenchymal volume loss is commensurate with the patient's age. Mild periventricular white matter changes are present likely reflecting the sequela of small vessel ischemia. Vasogenic edema is again seen within the left occipital lobe and right cerebellar hemisphere related to known cerebral metastases in these locations. The associated mass effect with partial effacement of the fourth ventricle and mild right-to-left deviation of the cerebellar vermis, and minimal effacement of the left lateral ventricle occipital horn is stable since prior examination. Known cerebral metastases themselves are not well visualized on this noncontrast examination. No midline shift. No acute  intracranial hemorrhage or infarct. Ventricular size is normal. No intra or extra-axial fluid collection identified. Vascular: No hyperdense vessel or unexpected calcification. Skull: Normal. Negative for fracture or focal lesion. Sinuses/Orbits: No acute finding. Other: Mastoid air cells and middle ear cavities are clear. IMPRESSION: 1. No acute intracranial hemorrhage or infarct. 2. Stable vasogenic edema within the left occipital lobe and right cerebellar hemisphere related to known cerebral metastases in these locations. Stable associated mass effect with partial effacement of the right ventricle and minimal effacement of the left lateral ventricle occipital horn. Known cerebral metastases themselves are not well visualized on this noncontrast examination. Electronically Signed   By: Dorethia Molt M.D.   On: 07/22/2024 20:11    EKG: I independently viewed the EKG done and my findings are as followed:  None available at the time of this visit.  Assessment/Plan Present on Admission:  Diplopia  Principal Problem:   Diplopia  Diplopia in the setting of non-small cell lung cancer with brain metastases Also with intermittent dizziness and difficulty with balance  Noncontrast head CT revealed findings as stated above Follow brain MRI with and without contrast. IV Decadron  10 mg twice daily x 3 doses. PT OT evaluation Fall precautions  Non-small cell lung cancer with brain metastases Prior history of radiation Consult oncology/radiation oncology in the morning Has an appointment with his medical oncologist, Dr. Geneva, at the [facility] in [place] on Tuesday, 07/25/2024. Has an appointment with his pulmonologist Dr. Shelah for possible lung biopsy on Monday, 07/24/2024.  Headaches, in the setting of brain mets CT head with findings as above Follow MRI brain with and without contrast Received headache cocktail with IV Decadron , IV Reglan , and IV Benadryl  x 1 dose.  Chronic combined  diastolic and systolic CHF Euvolemic on exam Resume home regimen Monitor strict I's and O's and daily weight  Hyperlipidemia Resume home regimen  Goals of care Palliative care medicine consulted to assist with goals of care discussion   Time: 75 minutes.   DVT prophylaxis: Subcu Lovenox  daily  Code Status: DNR  Family Communication: [name] brother is at bedside  Disposition Plan: Admitted to telemetry medical unit  Consults called: None.  Admission status: Observation status.   Status is: Observation    Terry LOISE Hurst MD [facility] Pager [phone]  If 7PM-7AM, please contact night-coverage [email or web address] Password [email or web address]  07/22/2024, 8:55 PM

## 2024-07-22 NOTE — ED Provider Notes (Signed)
 [place] EMERGENCY DEPARTMENT AT [facility] Provider Note  CSN: [id number] Arrival date & time: 07/22/24 8091  Chief Complaint(s) Diplopia  HPI Juan Norris is a 73 y.o. male history of AICD placement, CHF, diabetes, lung cancer, brain metastases presenting with double vision.  Patient reports this morning had episode of dizziness, then around 2 hours ago developed double vision, trouble walking.  Reports that the symptoms are identical to when he was initially diagnosed with brain mets.  He reports after initially being diagnosed he received brain radiation with improvement in those symptoms.  Denies any headaches, nausea, vomiting, chest pain, shortness of breath, fevers or chills.   Past Medical History Past Medical History:  Diagnosis Date   Acute renal insufficiency    AICD (automatic cardioverter/defibrillator) present    Anginal pain (HCC)    CHF (congestive heart failure) (HCC)    Chronic lower back pain    due to 3 ruptured discs in my back (11/08/2018)   Diabetes mellitus without complication (HCC)    High cholesterol    High cholesterol    History of kidney stones 1982   passed them   History of radiation therapy    Right lung- 11/03/21-11/14/21- Dr. Lynwood Nay   Hypertension    Lung cancer [facility])    Myocardial infarction (HCC) 03/2018   Systolic heart failure (HCC)    Tobacco use    Patient Active Problem List   Diagnosis Date Noted   Lung nodule 07/10/2024   Primary cancer of left upper lobe of lung (HCC) 01/07/2024   Metastatic cancer to brain (HCC) 11/13/2023   Elevated blood sugar 11/13/2023   Chronic periodontitis, unspecified 11/12/2023   Encounter for other preprocedural examination 11/12/2023   History of colonic polyps 11/12/2023   Malignant neoplasm of lower lobe of right lung (HCC) 11/12/2023   Type 2 diabetes mellitus without complications (HCC) 11/12/2023   Diplopia 11/12/2023   Dizziness 11/12/2023   Brain mass 11/12/2023    COPD (chronic obstructive pulmonary disease) (HCC) 05/28/2022   Left leg numbness 08/31/2021   CHF (congestive heart failure) (HCC) 08/31/2021   HTN (hypertension) 08/31/2021   Pulmonary nodule 08/20/2021   Chronic systolic dysfunction of left ventricle 11/08/2018   Tobacco use 03/31/2018   Acute combined systolic and diastolic heart failure (HCC) 03/31/2018   Acute renal insufficiency 03/31/2018   Elevated troponin    NSTEMI (non-ST elevated myocardial infarction) (HCC) 03/29/2018   Bilateral inguinal hernia 01/24/2016   Chronic low back pain 07/10/2015   CAD (coronary artery disease) 12/14/2012   Chest pain 04/07/2012   History of heart attack 04/07/2012   Home Medication(s) Prior to Admission medications   Medication Sig Start Date End Date Taking? Authorizing Provider  aspirin  81 MG EC tablet Take 1 tablet (81 mg total) by mouth daily. 11/09/18  Yes Leverne Charlies Helling, PA-C  atorvastatin  (LIPITOR ) 40 MG tablet Take 1 tablet (40 mg total) by mouth at bedtime. 10/13/21  Yes Shelah Lamar RAMAN, MD  carvedilol  (COREG ) 25 MG tablet Take 25 mg by mouth daily.   Yes [provider]  Empagliflozin-metFORMIN  HCl 12.5-500 MG TABS Take 1 tablet by mouth daily.   Yes [provider]  ferrous sulfate 325 (65 FE) MG tablet Take 325 mg by mouth daily with breakfast.   Yes [provider]  furosemide  (LASIX ) 20 MG tablet Take 1 tablet (20 mg total) by mouth daily. As directed may take 1 additional tablet by mouth daily only as needed for  weight gain of 2 lbs in 24 hours or 5 lbs in a week 02/03/24  Yes Dick, Ernest H Jr., NP  Multiple Vitamin (MULTIVITAMIN WITH MINERALS) TABS tablet Take 1 tablet by mouth daily.   Yes [provider]  sacubitril -valsartan  (ENTRESTO ) 24-26 MG Take 1 tablet by mouth 2 (two) times daily. 01/27/24  Yes [provider]                                                                                                                                     Past Surgical History Past Surgical History:  Procedure Laterality Date   BIV ICD INSERTION CRT-D N/A 11/08/2018    [employer] biventricular ICD implantation by Dr Kelsie with His Bundle pacing and a MDT 3830 lead placed into the LV port.    BRONCHIAL BIOPSY  10/13/2021   Procedure: BRONCHIAL BIOPSIES;  Surgeon: Shelah Lamar RAMAN, MD;  Location: [facility] ENDOSCOPY;  Service: Pulmonary;;   BRONCHIAL BIOPSY  12/27/2023   Procedure: BRONCHIAL BIOPSIES;  Surgeon: Shelah Lamar RAMAN, MD;  Location: [facility] ENDOSCOPY;  Service: Pulmonary;;   BRONCHIAL BRUSHINGS  10/13/2021   Procedure: BRONCHIAL BRUSHINGS;  Surgeon: Shelah Lamar RAMAN, MD;  Location: [facility] ENDOSCOPY;  Service: Pulmonary;;   BRONCHIAL BRUSHINGS  12/27/2023   Procedure: BRONCHIAL BRUSHINGS;  Surgeon: Shelah Lamar RAMAN, MD;  Location: [facility] ENDOSCOPY;  Service: Pulmonary;;   BRONCHIAL NEEDLE ASPIRATION BIOPSY  10/13/2021   Procedure: BRONCHIAL NEEDLE ASPIRATION BIOPSIES;  Surgeon: Shelah Lamar RAMAN, MD;  Location: [facility] ENDOSCOPY;  Service: Pulmonary;;   BRONCHIAL NEEDLE ASPIRATION BIOPSY  12/27/2023   Procedure: BRONCHIAL NEEDLE ASPIRATION BIOPSIES;  Surgeon: Shelah Lamar RAMAN, MD;  Location: [facility] ENDOSCOPY;  Service: Pulmonary;;   BRONCHIAL WASHINGS  10/13/2021   Procedure: BRONCHIAL WASHINGS;  Surgeon: Shelah Lamar RAMAN, MD;  Location: [facility] ENDOSCOPY;  Service: Pulmonary;;   CARDIAC CATHETERIZATION     FIDUCIAL MARKER PLACEMENT  10/13/2021   Procedure: FIDUCIAL MARKER PLACEMENT;  Surgeon: Shelah Lamar RAMAN, MD;  Location: [facility] ENDOSCOPY;  Service: Pulmonary;;   FIDUCIAL MARKER PLACEMENT  12/27/2023   Procedure: FIDUCIAL MARKER PLACEMENT;  Surgeon: Shelah Lamar RAMAN, MD;  Location: [facility] ENDOSCOPY;  Service: Pulmonary;;   INGUINAL HERNIA REPAIR Bilateral    IR IMAGING GUIDED PORT INSERTION  11/15/2023   KNEE SURGERY Left 1990s   growth on the back of my knee removed   LEFT HEART CATH AND CORONARY ANGIOGRAPHY N/A 03/30/2018   Procedure: LEFT HEART CATH AND CORONARY  ANGIOGRAPHY;  Surgeon: Verlin Lonni BIRCH, MD;  Location: [facility] INVASIVE CV LAB;  Service: Cardiovascular;  Laterality: N/A;   VIDEO BRONCHOSCOPY WITH ENDOBRONCHIAL NAVIGATION N/A 10/13/2021   Procedure: ROBOTIC VIDEO BRONCHOSCOPY WITH ENDOBRONCHIAL NAVIGATION;  Surgeon: Shelah Lamar RAMAN, MD;  Location: [facility] ENDOSCOPY;  Service: Pulmonary;  Laterality: N/A;   VIDEO BRONCHOSCOPY WITH RADIAL ENDOBRONCHIAL ULTRASOUND  10/13/2021   Procedure: RADIAL ENDOBRONCHIAL ULTRASOUND;  Surgeon: Shelah Lamar RAMAN, MD;  Location: [facility]  ENDOSCOPY;  Service: Pulmonary;;   Family History Family History  Problem Relation Age of Onset   Leukemia Mother    Cancer Father    Cancer Sister    Cancer Brother    CAD Brother     Social History Social History   Tobacco Use   Smoking status: Former    Current packs/day: 1.50    Average packs/day: 1.5 packs/day for 47.0 years (70.5 ttl pk-yrs)    Types: Cigarettes   Smokeless tobacco: Former   Tobacco comments:    Pt smokes 1 cig weekly when something bothers him. AB, CMA 07-10-24        1 cigarette smoked daily 05/28/22 ARJ   Vaping Use   Vaping status: Never Used  Substance Use Topics   Alcohol use: Not Currently    Comment: 11/08/2018 stopped 09/23/1998   Drug use: Not Currently    Comment: 11/08/2018 nothing in the 2000s   Allergies Patient has no known allergies.  Review of Systems Review of Systems  All other systems reviewed and are negative.   Physical Exam Vital Signs  I have reviewed the triage vital signs BP 130/67   Pulse 64   Temp 97.7 F (36.5 C) (Oral)   Resp 12   Wt 68.3 kg   SpO2 98%   BMI 24.31 kg/m  Physical Exam Vitals and nursing note reviewed.  Constitutional:      General: He is not in acute distress.    Appearance: Normal appearance.  HENT:     Head: Normocephalic and atraumatic.     Mouth/Throat:     Mouth: Mucous membranes are moist.  Eyes:     Extraocular Movements: Extraocular movements intact.      Conjunctiva/sclera: Conjunctivae normal.     Pupils: Pupils are equal, round, and reactive to light.  Cardiovascular:     Rate and Rhythm: Normal rate and regular rhythm.  Pulmonary:     Effort: Pulmonary effort is normal. No respiratory distress.     Breath sounds: Normal breath sounds.  Abdominal:     General: Abdomen is flat.     Palpations: Abdomen is soft.     Tenderness: There is no abdominal tenderness.  Musculoskeletal:     Right lower leg: No edema.     Left lower leg: No edema.  Skin:    General: Skin is warm and dry.     Capillary Refill: Capillary refill takes less than 2 seconds.  Neurological:     General: No focal deficit present.     Mental Status: He is alert and oriented to person, place, and time. Mental status is at baseline.     Comments: Cranial nerves II through XII intact, strength 5 out of 5 in the bilateral upper and lower extremities, no sensory deficit to light touch, possible slight right sided dysmetria and abnormal heel-to-shin testing but very subtle  Psychiatric:        Mood and Affect: Mood normal.        Behavior: Behavior normal.     ED Results and Treatments Labs (all labs ordered are listed, but only abnormal results are displayed) Labs Reviewed  COMPREHENSIVE METABOLIC PANEL WITH GFR  CBC WITH DIFFERENTIAL/PLATELET  PROTIME-INR  Radiology CT Head Wo Contrast Result Date: 07/22/2024 CLINICAL DATA:  Brain metastases, assess treatment response. Neuro deficit, acute, stroke suspected. Double vision EXAM: CT HEAD WITHOUT CONTRAST TECHNIQUE: Contiguous axial images were obtained from the base of the skull through the vertex without intravenous contrast. RADIATION DOSE REDUCTION: This exam was performed according to the departmental dose-optimization program which includes automated exposure control, adjustment of the mA and/or kV  according to patient size and/or use of iterative reconstruction technique. COMPARISON:  07/11/2024, MRI 07/18/2024 FINDINGS: Brain: Mild parenchymal volume loss is commensurate with the patient's age. Mild periventricular white matter changes are present likely reflecting the sequela of small vessel ischemia. Vasogenic edema is again seen within the left occipital lobe and right cerebellar hemisphere related to known cerebral metastases in these locations. The associated mass effect with partial effacement of the fourth ventricle and mild right-to-left deviation of the cerebellar vermis, and minimal effacement of the left lateral ventricle occipital horn is stable since prior examination. Known cerebral metastases themselves are not well visualized on this noncontrast examination. No midline shift. No acute intracranial hemorrhage or infarct. Ventricular size is normal. No intra or extra-axial fluid collection identified. Vascular: No hyperdense vessel or unexpected calcification. Skull: Normal. Negative for fracture or focal lesion. Sinuses/Orbits: No acute finding. Other: Mastoid air cells and middle ear cavities are clear. IMPRESSION: 1. No acute intracranial hemorrhage or infarct. 2. Stable vasogenic edema within the left occipital lobe and right cerebellar hemisphere related to known cerebral metastases in these locations. Stable associated mass effect with partial effacement of the right ventricle and minimal effacement of the left lateral ventricle occipital horn. Known cerebral metastases themselves are not well visualized on this noncontrast examination. Electronically Signed   By: Dorethia Molt M.D.   On: 07/22/2024 20:11    Pertinent labs & imaging results that were available during my care of the patient were reviewed by me and considered in my medical decision making (see MDM for details).  Medications Ordered in ED Medications  enoxaparin  (LOVENOX ) injection 40 mg (has no administration in  time range)  acetaminophen  (TYLENOL ) tablet 650 mg (has no administration in time range)  prochlorperazine  (COMPAZINE ) injection 5 mg (has no administration in time range)  melatonin tablet 5 mg (has no administration in time range)  polyethylene glycol (MIRALAX  / GLYCOLAX ) packet 17 g (has no administration in time range)                                                                                                                                     Procedures Procedures  (including critical care time)  Medical Decision Making / ED Course   MDM:  73 year old presenting to the emergency department with double vision.  Patient overall well-appearing, neurologic exam overall is reassuring, no obvious extraocular movement abnormality on gross examination.  Possibly some right sided cerebellar findings although very subtle.  Symptoms concerning for issue with metastatic  disease, such as worsening tumor, bleeding, edema.  Will obtain CT head.  Unfortunately MRI not available at this time.  Patient may need to be admitted for MRI.  Considered also stroke, however patient not TNK candidate due to known intracranial metastatic disease and patient is symptoms not consistent with LVO so will not activate code stroke.  Clinical Course as of 07/22/24 2133  Sat Jul 22, 2024  2128 CT head with signs of metastatic disease. Discussed with Dr. Michaela neurology, MRI not available, thinks it is reasonable for him to be admitted for MRI/observation, but unless sign of stroke doesn't think neurology needs to be involved further and would recommend oncology involvement. Discussed with Dr. Shona with hospitalist who has admitted patient for further management  [WS]    Clinical Course User Index [WS] Francesca Elsie CROME, MD     Additional history obtained: -Additional history obtained from family -External records from outside source obtained and reviewed including: Chart review including previous  notes, labs, imaging, consultation notes including prior oncology notes    Lab Tests: -I ordered, reviewed, and interpreted labs.   The pertinent results include:   Labs Reviewed  COMPREHENSIVE METABOLIC PANEL WITH GFR  CBC WITH DIFFERENTIAL/PLATELET  PROTIME-INR    Notable for no acute abnormality    Imaging Studies ordered: I ordered imaging studies including CT head  On my interpretation imaging demonstrates cerebral edema  I independently visualized and interpreted imaging. I agree with the radiologist interpretation   Medicines ordered and prescription drug management: Meds ordered this encounter  Medications   enoxaparin  (LOVENOX ) injection 40 mg   acetaminophen  (TYLENOL ) tablet 650 mg   prochlorperazine  (COMPAZINE ) injection 5 mg   melatonin tablet 5 mg   polyethylene glycol (MIRALAX  / GLYCOLAX ) packet 17 g    -I have reviewed the patients home medicines and have made adjustments as needed   Consultations Obtained: I requested consultation with the hospitalist,  and discussed lab and imaging findings as well as pertinent plan - they recommend: admission   Cardiac Monitoring: The patient was maintained on a cardiac monitor.  I personally viewed and interpreted the cardiac monitored which showed an underlying rhythm of: NSR  Social Determinants of Health:  Diagnosis or treatment significantly limited by social determinants of health: former smoker   Reevaluation: After the interventions noted above, I reevaluated the patient and found that their symptoms have stayed the same  Co morbidities that complicate the patient evaluation  Past Medical History:  Diagnosis Date   Acute renal insufficiency    AICD (automatic cardioverter/defibrillator) present    Anginal pain (HCC)    CHF (congestive heart failure) (HCC)    Chronic lower back pain    due to 3 ruptured discs in my back (11/08/2018)   Diabetes mellitus without complication (HCC)    High cholesterol     High cholesterol    History of kidney stones 1982   passed them   History of radiation therapy    Right lung- 11/03/21-11/14/21- Dr. Lynwood Nay   Hypertension    Lung cancer [facility])    Myocardial infarction (HCC) 03/2018   Systolic heart failure (HCC)    Tobacco use       Dispostion: Disposition decision including need for hospitalization was considered, and patient admitted to the hospital.    Final Clinical Impression(s) / ED Diagnoses Final diagnoses:  Diplopia     This chart was dictated using voice recognition software.  Despite best efforts to proofread,  errors can  occur which can change the documentation meaning.    Francesca Elsie CROME, MD 07/22/24 2133

## 2024-07-23 ENCOUNTER — Observation Stay (HOSPITAL_COMMUNITY)

## 2024-07-23 ENCOUNTER — Encounter (HOSPITAL_COMMUNITY): Payer: Self-pay

## 2024-07-23 DIAGNOSIS — H532 Diplopia: Secondary | ICD-10-CM | POA: Diagnosis not present

## 2024-07-23 LAB — BASIC METABOLIC PANEL WITH GFR
Anion gap: 10 (ref 5–15)
BUN: 24 mg/dL — ABNORMAL HIGH (ref 8–23)
CO2: 19 mmol/L — ABNORMAL LOW (ref 22–32)
Calcium: 9 mg/dL (ref 8.9–10.3)
Chloride: 108 mmol/L (ref 98–111)
Creatinine, Ser: 0.99 mg/dL (ref 0.61–1.24)
GFR, Estimated: >60 mL/min (ref >60)
Glucose, Bld: 167 mg/dL — ABNORMAL HIGH (ref 70–99)
Potassium: 4 mmol/L (ref 3.5–5.1)
Sodium: 137 mmol/L (ref 135–145)

## 2024-07-23 LAB — MAGNESIUM: Magnesium: 2.2 mg/dL (ref 1.7–2.4)

## 2024-07-23 LAB — PHOSPHORUS: Phosphorus: 3.3 mg/dL (ref 2.5–4.6)

## 2024-07-23 MED ORDER — IOHEXOL 350 MG/ML SOLN
75.0000 mL | Freq: Once | INTRAVENOUS | Status: AC | PRN
  Administered 2024-07-23: 75 mL via INTRAVENOUS

## 2024-07-23 NOTE — Progress Notes (Signed)
 AVS reviewed with patient. All questions answered. IV removed. Pt d/ced in stable condition.

## 2024-07-23 NOTE — Discharge Summary (Signed)
 Physician Discharge Summary   Patient: Juan Norris MRN: [id number] DOB: 13-Apr-1951  Admit date:     07/22/2024  Discharge date: 07/23/24  Discharge Physician: Garnette Pelt   PCP: [facility]   Recommendations at discharge:    Follow up with PCP In 1-2 weeks Follow up with Oncologist as already scheduled Follow up with Pulmonologist as already scheduled  Discharge Diagnoses: Principal Problem:   Diplopia  Resolved Problems:   * No resolved hospital problems. *  Hospital Course: 73 y.o. 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  Associated with intermittent dizziness lasting about 3 seconds and resolving spontaneously.  Last known well around 6 PM.  No focal motor or sensory deficits.  No dysphasia.  States he has had chronic difficulty with his balance.   In the ER, vital signs are stable.  EDP discussed the case with neurology.  Recommended admission for brain MRI.  Not a candidate for TNK due to intra cranial mass.     At the time of this visit, the patient's symptoms had resolved.  He endorses a throbbing headache 7 out of 10.   Noncontrast head CT revealed no acute intracranial hemorrhage or infarct.  Stable vasogenic edema within the left occipital lobe and right cerebellar hemisphere related to known cerebral metastases in these locations.  Stable associated mass effect with partial effacement of the right ventricle and minimal effacement of the left lateral ventricle occipital horn.  Known cerebral metastasis themselves are not well-visualized on this noncontrast examination.  TRH, hospitalist service, was asked to admit.  Assessment and  Plan: Diplopia in the setting of non-small cell lung cancer with brain metastases Also with intermittent dizziness and difficulty with balance  Noncontrast head CT revealed findings as stated above Unable to perform MRI because of pacemaker Symptoms fully resolved this visit Ordered and reviewed f/u CTA head. Only known and expected vasogenic edema c/w known brain mets seen, otherwise negative Pt scheduled to f/u with Oncologist this week for MRI   Non-small cell lung cancer with brain metastases Prior history of radiation Consult oncology/radiation oncology in the morning Has an appointment with his medical oncologist, Dr. Geneva, at the [facility] in [place] on Tuesday, 07/25/2024. Has an appointment with his pulmonologist Dr. Shelah for possible lung biopsy on Monday, 07/24/2024.   Headaches, in the setting of brain mets CT head with findings as above CTA per above   Chronic combined diastolic and systolic CHF Euvolemic on exam Resume home regimen Monitor strict I's and O's and daily weight   Hyperlipidemia Resume home regimen    Consultants:  Procedures performed:   Disposition: Home Diet recommendation:  Regular diet DISCHARGE MEDICATION: Allergies as of 07/23/2024   No Known Allergies      Medication List     TAKE these medications    aspirin  EC 81 MG tablet Take 1 tablet (81 mg total) by mouth daily.   atorvastatin  40 MG tablet Commonly known as: LIPITOR  Take 1 tablet (40 mg total) by mouth at bedtime.   carvedilol  25 MG tablet Commonly known as: COREG  Take 25 mg by mouth daily.   Empagliflozin-metFORMIN  HCl 12.5-500 MG Tabs Take 1 tablet by mouth daily.   ferrous sulfate 325 (  65 FE) MG tablet Take 325 mg by mouth daily with breakfast.   furosemide  20 MG tablet Commonly known as: LASIX  Take 1 tablet (20 mg total) by mouth daily. As directed may take 1 additional tablet by mouth daily only as needed for weight gain of 2 lbs in 24 hours or 5 lbs in  a week   multivitamin with minerals Tabs tablet Take 1 tablet by mouth daily.   sacubitril -valsartan  24-26 MG Commonly known as: ENTRESTO  Take 1 tablet by mouth 2 (two) times daily.        Follow-up Information     [facility] Follow up in 2 week(s).   Why: Hospital follow up Contact information: [address] [facility] [place] [address] [phone]         Follow up with your Oncologist as originally scheduled Follow up.   Why: Hospital follow up        Follow up with your Pulmonologist as already scheduled Follow up.   Why: Hospital follow up               Discharge Exam: Filed Weights   07/22/24 1925 07/23/24 0603  Weight: 68.3 kg 66.8 kg   General exam: Awake, laying in bed, in nad Respiratory system: Normal respiratory effort, no wheezing Cardiovascular system: regular rate, s1, s2 Gastrointestinal system: Soft, nondistended, positive BS Central nervous system: CN2-12 grossly intact, strength intact Extremities: Perfused, no clubbing Skin: Normal skin turgor, no notable skin lesions seen Psychiatry: Mood normal // no visual hallucinations   Condition at discharge: fair  The results of significant diagnostics from this hospitalization (including imaging, microbiology, ancillary and laboratory) are listed below for reference.   Imaging Studies: CT ANGIO HEAD W OR WO CONTRAST Result Date: 07/23/2024 EXAM: CTA Head Without and with Intravenous Contrast CLINICAL HISTORY: Headache, sudden, severe. Scan due to sudden severe headache. Pt c/o diplopia. Known brain metastases. TECHNIQUE: Axial CTA images of the head without and with intravenous contrast. MIP reconstructed images were created and reviewed. Dose reduction technique was used including one or more of the following: automated exposure control, adjustment of mA and kV according to patient size, and/or iterative reconstruction. CONTRAST: Without and with; 75 mL of iohexol   (OMNIPAQUE ) 350 MG/ML injection. COMPARISON: None provided. FINDINGS: INTERNAL CAROTID ARTERIES: The intracranial ICAs are patent with no significant stenosis. No occlusion. No aneurysm. Atherosclerotic calcifications are present within the cavernous internal carotid arteries bilaterally without significant stenosis relative to the ICA turbinate. ANTERIOR CEREBRAL ARTERIES: No significant stenosis. No occlusion. No aneurysm. MIDDLE CEREBRAL ARTERIES: No significant stenosis. No occlusion. No aneurysm. POSTERIOR CEREBRAL ARTERIES: No significant stenosis. No occlusion. No aneurysm. BASILAR ARTERY: No significant stenosis. No occlusion. No aneurysm. VERTEBRAL ARTERIES: No significant stenosis. No occlusion. No aneurysm. SOFT TISSUES: No acute finding. No masses or lymphadenopathy. BONES: No acute osseous abnormality. BRAIN: Areas of hypoattenuation and vasogenic edema in the left occipital lobe and inferior right cerebellum are stable, consistent with known brain metastases. No new lesions are evident. IMPRESSION: 1. No evidence of significant stenosis, aneurysmal dilatation, or dissection involving the arteries of the head. 2. Stable areas of hypoattenuation and vasogenic edema in the left occipital lobe and inferior right cerebellum, consistent with known brain metastases. No new lesions are evident. Electronically signed by: Lonni Necessary MD 07/23/2024 11:02 AM EDT RP Workstation: [id number]   CT Head Wo Contrast Result Date: 07/22/2024 CLINICAL DATA:  Brain metastases, assess treatment response. Neuro deficit, acute, stroke suspected. Double vision EXAM: CT HEAD WITHOUT  CONTRAST TECHNIQUE: Contiguous axial images were obtained from the base of the skull through the vertex without intravenous contrast. RADIATION DOSE REDUCTION: This exam was performed according to the departmental dose-optimization program which includes automated exposure control, adjustment of the mA and/or kV according to patient size  and/or use of iterative reconstruction technique. COMPARISON:  07/11/2024, MRI 07/18/2024 FINDINGS: Brain: Mild parenchymal volume loss is commensurate with the patient's age. Mild periventricular white matter changes are present likely reflecting the sequela of small vessel ischemia. Vasogenic edema is again seen within the left occipital lobe and right cerebellar hemisphere related to known cerebral metastases in these locations. The associated mass effect with partial effacement of the fourth ventricle and mild right-to-left deviation of the cerebellar vermis, and minimal effacement of the left lateral ventricle occipital horn is stable since prior examination. Known cerebral metastases themselves are not well visualized on this noncontrast examination. No midline shift. No acute intracranial hemorrhage or infarct. Ventricular size is normal. No intra or extra-axial fluid collection identified. Vascular: No hyperdense vessel or unexpected calcification. Skull: Normal. Negative for fracture or focal lesion. Sinuses/Orbits: No acute finding. Other: Mastoid air cells and middle ear cavities are clear. IMPRESSION: 1. No acute intracranial hemorrhage or infarct. 2. Stable vasogenic edema within the left occipital lobe and right cerebellar hemisphere related to known cerebral metastases in these locations. Stable associated mass effect with partial effacement of the right ventricle and minimal effacement of the left lateral ventricle occipital horn. Known cerebral metastases themselves are not well visualized on this noncontrast examination. Electronically Signed   By: Dorethia Molt M.D.   On: 07/22/2024 20:11   CT Super D Chest Wo Contrast Result Date: 07/21/2024 CLINICAL DATA:  Malignant neoplasm of right lower lobe with brain metastasis EXAM: CT CHEST WITHOUT CONTRAST TECHNIQUE: Multidetector CT imaging of the chest was performed using thin slice collimation for electromagnetic bronchoscopy planning purposes,  without intravenous contrast. RADIATION DOSE REDUCTION: This exam was performed according to the departmental dose-optimization program which includes automated exposure control, adjustment of the mA and/or kV according to patient size and/or use of iterative reconstruction technique. COMPARISON:  PET-CT June 23, 2024 FINDINGS: Cardiovascular: The heart size is normal. Atherosclerotic calcifications of coronary arteries. To a lead pacer in place with the leads in right atrium and ventricle. Right-sided porta catheter tip terminates in distal SVC. No pericardial fluid. Mediastinum/Nodes: Multiple subcentimeter and pericentimeter mediastinal lymph nodes. Lungs/pleura: Upper lobe predominant centrilobular emphysematous changes. Few bulla measuring up to 5 cm in left apex and right lower lobe. Right lung base interlobular septal thickening. No significant honeycombing. There is a right lower lobe posteromedial lobulated nodule measuring 1.5 x 1.2 cm, stable to prior. A fiducial marker is identified adjacent to this lesion. Left upper lobe subpleural spiculated nodule measuring 2.5 x 1.3 cm, stable to prior. Fiducial marker identified adjacent to this lesion. Right upper lobe 6 mm nodule, stable to prior (4/49). There is bronchial and bronchiolar wall thickening. Ground-glass attenuation and scarring along the right lower lobe site of known malignancy likely posttreatment changes. No new nodule.  No pleural effusion. Upper Abdomen: Punctate calcifications of the spleen suggestive of prior granulomatous disease. Musculoskeletal: Bilateral mild gynecomastia. IMPRESSION: Stable appearance of spiculated nodules along the subpleural left upper lobe and posteromedial right lower lobe with adjacent fiducial markers consistent with known multifocal synchronous bronchogenic malignancies. A right upper lobe 6 mm nodule is stable. No new nodules. Follow-up according to oncology protocols. 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 Scattered calcifications of the spleen indicating prior granulomatous disease (mycobacterial , sarcoidosis). Electronically Signed   By: Megan  Zare M.D.   On: 07/21/2024 19:10   MR Brain W Wo Contrast Result Date: 07/18/2024 EXAM: MRI BRAIN WITH AND WITHOUT CONTRAST 07/18/2024 10:24:00 AM TECHNIQUE: Multiplanar multisequence MRI of the head/brain was performed with and without the administration of intravenous contrast. COMPARISON: MRI of the head dated 07/32/2025. CLINICAL HISTORY: Brain metastases, assess treatment response; 3T SRS Protocol. Short interval scan to re-evaluate treatment effect. FINDINGS: BRAIN AND VENTRICLES: There are 2 metastatic lesions again demonstrated. A lesion present anteriorly within the right cerebellar hemisphere appears slightly more circumscribed and slightly smaller in overall size and has decreased in maximal dimensions in the interim from 3.3 x 2.5 x 3.0 cm to approximately 3.0 x 2.4 x 2.8 cm. The lesion within the left occipital lobe appears essentially unchanged measuring approximately 16 x 9 x 18 mm. The surrounding cerebral edema appears unchanged. There continues to be partial effacement of the fourth ventricle and mild displacement of the brain stem to the left. There are no additional lesions present. ORBITS: No acute abnormality. SINUSES: There is moderate mucosal disease within the floor of the left maxillary sinus and mild mucosal disease within the right maxillary sinus. BONES AND SOFT TISSUES: Normal bone marrow signal and enhancement. No acute soft tissue abnormality. IMPRESSION: 1. Slight decrease in size of the right cerebellar hemisphere metastatic lesion. 2. Stable left occipital lobe metastatic lesion. 3. No new lesions. 4. Unchanged surrounding cerebral edema. 5. Partial effacement of the fourth ventricle and mild displacement of the brain stem to the left. Electronically signed by: evalene coho  07/18/2024 03:12 PM EDT RP Workstation: [id number]   MR Brain W and Wo Contrast Result Date: 07/12/2024 CLINICAL DATA:  73 year old male with metastatic lung cancer. 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 Restaging. EXAM: MRI HEAD WITHOUT AND WITH CONTRAST TECHNIQUE: Multiplanar, multiecho pulse sequences of the brain and surrounding structures were obtained without and with intravenous contrast. CONTRAST:  6.5mL GADAVIST  GADOBUTROL  1 MMOL/ML IV SOLN COMPARISON:  Brain MRI 05/24/2024 and earlier. FINDINGS: Brain: Confluent edema in the right cerebellar hemisphere, not improved since 05/24/2024, moderately progressed since 02/22/2024. Mass effect on the 4th ventricle also appears further progressed since June now on series 6, image 10. Underlying irregularly rim enhancing right cerebellar mass now measures up to 32 mm long axis on series 10, image 21, versus 22 mm in March. And this does appear mildly increased in size from last month also (about 26 mm at that time). Basilar cisterns remain patent. Left occipital lobe treatment site T2/FLAIR hyperintensity which is more stable since March. Residual irregular rim enhancing lesion there measuring 16 mm long axis is perhaps 1-2 mm larger since March and stable from last month. No new cerebral edema identified. No new enhancing lesion identified. No dural thickening or enhancement. No superimposed restricted diffusion to suggest acute infarction. No midline shift, ventriculomegaly, extra-axial collection or acute intracranial hemorrhage. Cervicomedullary junction and pituitary are within normal limits. Scattered nonspecific cerebral white matter T2 and FLAIR hyperintensity is stable, probably small vessel disease related including in the left superior cerebellum. Chronic microhemorrhage in the posterior left hemisphere series 7, image 63 is stable, along with mild hemosiderin at the site of both metastases. Vascular:  Major intracranial vascular flow voids are preserved. Following contrast the major dural venous sinuses are enhancing and appear to  be patent. Skull and upper cervical spine: Negative visible cervical spine and spinal cord. Visualized bone marrow signal is within normal limits. Sinuses/Orbits: Stable and negative. Other: Negative visible scalp and face. IMPRESSION: 1. Right cerebellar edema has significantly progressed since March, similar to last month although mass effect on the 4th ventricle has further increased. Underlying irregular rim enhancing metastasis there has increased by 1 cm since March, a few mm larger since June. Basilar cisterns remain patent.  No ventriculomegaly. 2. Considerably more stable treated Left occipital metastasis since March, questionably 1-2 mm larger, with unchanged regional T2/FLAIR hyperintensity. 3. No new metastatic disease identified. Electronically Signed   By: VEAR Hurst M.D.   On: 07/12/2024 12:21   CT HEAD WO CONTRAST Result Date: 07/11/2024 CLINICAL DATA:  Headache, sudden, severe. Lung cancer metastatic to brain. EXAM: CT HEAD WITHOUT CONTRAST TECHNIQUE: Contiguous axial images were obtained from the base of the skull through the vertex without intravenous contrast. RADIATION DOSE REDUCTION: This exam was performed according to the departmental dose-optimization program which includes automated exposure control, adjustment of the mA and/or kV according to patient size and/or use of iterative reconstruction technique. COMPARISON:  MRI of the head dated May 24, 2024. FINDINGS: Brain: There is cerebral swelling in the left occipital lobe, corresponding with an area of metastatic disease noted on the previous study. There is also moderate cerebellar edema within the right cerebellar hemisphere, which appears to be more extensive than on the previous study. There is worsening effacement of the fourth ventricle. There is no obstructive hydrocephalus or tonsillar herniation.  There is mild mass effect upon the pons and there is partial effacement of the prepontine cistern. Vascular: Moderate calcific atheromatous disease within the carotid siphons. Skull: Intact and unremarkable. Sinuses/Orbits: Mild mucosal disease within the floors of the maxillary sinuses bilaterally. Normal orbits. Other: None. IMPRESSION: 1. Interval worsening of swelling of the right cerebellar hemisphere secondary to metastatic disease. There is partial effacement of the fourth ventricle and the prepontine cistern. 2. Left occipital cerebral swelling consistent with metastatic disease, which appears similar to the prior exam. Electronically Signed   By: Evalene Coho M.D.   On: 07/11/2024 12:43    Microbiology: Results for orders placed or performed during the hospital encounter of 10/13/21  SARS Coronavirus 2 by RT PCR (hospital order, performed in [facility] hospital lab) Nasopharyngeal Nasopharyngeal Swab     Status: None   Collection Time: 10/13/21  5:43 AM   Specimen: Nasopharyngeal Swab  Result Value Ref Range Status   SARS Coronavirus 2 NEGATIVE NEGATIVE Final    Comment: (NOTE) SARS-CoV-2 target nucleic acids are NOT DETECTED.  The SARS-CoV-2 RNA is generally detectable in upper and lower respiratory specimens during the acute phase of infection. The lowest concentration of SARS-CoV-2 viral copies this assay can detect is 250 copies / mL. A negative result does not preclude SARS-CoV-2 infection and should not be used as the sole basis for treatment or other patient management decisions.  A negative result may occur with improper specimen collection / handling, submission of specimen other than nasopharyngeal swab, presence of viral mutation(s) within the areas targeted by this assay, and inadequate number of viral copies (<250 copies / mL). A negative result must be combined with clinical observations, patient history, and epidemiological information.  Fact Sheet for Patients:    BoilerBrush.com.cy  Fact Sheet for Healthcare Providers: https://pope.com/  This test is not yet approved or  cleared by the [place]  FDA and has been authorized  for detection and/or diagnosis of SARS-CoV-2 by FDA under an Emergency Use Authorization (EUA).  This EUA will remain in effect (meaning this test can be used) for the duration of the COVID-19 declaration under Section 564(b)(1) of the Act, 21 U.S.C. section 360bbb-3(b)(1), unless the authorization is terminated or revoked sooner.  Performed at [facility], [address], [place], [place] [address]     Labs: CBC: Recent Labs  Lab 07/22/24 2007  WBC 9.2  NEUTROABS 6.6  HGB 14.1  HCT 43.9  MCV 90.3  PLT 205   Basic Metabolic Panel: Recent Labs  Lab 07/22/24 2007 07/23/24 0443  NA 135 137  K 3.8 4.0  CL 103 108  CO2 22 19*  GLUCOSE 99 167*  BUN 18 24*  CREATININE 1.14 0.99  CALCIUM  9.2 9.0  MG  --  2.2  PHOS  --  3.3   Liver Function Tests: Recent Labs  Lab 07/22/24 2007  AST 24  ALT 19  ALKPHOS 76  BILITOT 1.0  PROT 6.6  ALBUMIN 4.1   CBG: No results for input(s): GLUCAP in the last 168 hours.  Discharge time spent: less than 30 minutes.  Signed: Garnette Pelt, MD [facility] 07/23/2024

## 2024-07-23 NOTE — Progress Notes (Signed)
 Pt with no reports of pain this morning. Walking in room with no balance issues or dizziness. Report no double vision.   Per [facility] MRI tech, due to the fact that patient has a pacemaker, MRI will not be able to be completed at [facility]. MRI will have to be completed at [facility]. [facility] stated that they will not be able to do it this weekend and it will have to be scheduled during the week. MD aware.

## 2024-07-23 NOTE — Hospital Course (Signed)
 73 y.o. 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  Associated with intermittent dizziness lasting about 3 seconds and resolving spontaneously.  Last known well around 6 PM.  No focal motor or sensory deficits.  No dysphasia.  States he has had chronic difficulty with his balance.   In the ER, vital signs are stable.  EDP discussed the case with neurology.  Recommended admission for brain MRI.  Not a candidate for TNK due to intra cranial mass.     At the time of this visit, the patient's symptoms had resolved.  He endorses a throbbing headache 7 out of 10.   Noncontrast head CT revealed no acute intracranial hemorrhage or infarct.  Stable vasogenic edema within the left occipital lobe and right cerebellar hemisphere related to known cerebral metastases in these locations.  Stable associated mass effect with partial effacement of the right ventricle and minimal effacement of the left lateral ventricle occipital horn.  Known cerebral metastasis themselves are not well-visualized on this noncontrast examination.  TRH, hospitalist service, was asked to admit.

## 2024-07-24 ENCOUNTER — Ambulatory Visit (HOSPITAL_COMMUNITY)

## 2024-07-24 ENCOUNTER — Encounter (HOSPITAL_COMMUNITY): Payer: Self-pay | Admitting: Emergency Medicine

## 2024-07-24 ENCOUNTER — Ambulatory Visit (HOSPITAL_COMMUNITY)
Admission: RE | Admit: 2024-07-24 | Discharge: 2024-07-24 | Disposition: A | Discharge status: Home / Self Care | Attending: Emergency Medicine | Admitting: Emergency Medicine

## 2024-07-24 ENCOUNTER — Other Ambulatory Visit: Payer: Self-pay

## 2024-07-24 ENCOUNTER — Inpatient Hospital Stay

## 2024-07-24 ENCOUNTER — Encounter (HOSPITAL_COMMUNITY)
Admission: RE | Disposition: A | Discharge status: Home / Self Care | Payer: Self-pay | Source: Home / Self Care | Attending: Emergency Medicine

## 2024-07-24 ENCOUNTER — Ambulatory Visit (HOSPITAL_COMMUNITY): Payer: Self-pay | Admitting: Physician Assistant

## 2024-07-24 ENCOUNTER — Ambulatory Visit (HOSPITAL_BASED_OUTPATIENT_CLINIC_OR_DEPARTMENT_OTHER): Payer: Self-pay | Admitting: Physician Assistant

## 2024-07-24 DIAGNOSIS — I110 Hypertensive heart disease with heart failure: Secondary | ICD-10-CM | POA: Insufficient documentation

## 2024-07-24 DIAGNOSIS — Z95810 Presence of automatic (implantable) cardiac defibrillator: Secondary | ICD-10-CM | POA: Diagnosis not present

## 2024-07-24 DIAGNOSIS — J449 Chronic obstructive pulmonary disease, unspecified: Secondary | ICD-10-CM | POA: Insufficient documentation

## 2024-07-24 DIAGNOSIS — R911 Solitary pulmonary nodule: Secondary | ICD-10-CM

## 2024-07-24 DIAGNOSIS — R590 Localized enlarged lymph nodes: Secondary | ICD-10-CM

## 2024-07-24 DIAGNOSIS — I080 Rheumatic disorders of both mitral and aortic valves: Secondary | ICD-10-CM | POA: Diagnosis not present

## 2024-07-24 DIAGNOSIS — Z85118 Personal history of other malignant neoplasm of bronchus and lung: Secondary | ICD-10-CM | POA: Insufficient documentation

## 2024-07-24 DIAGNOSIS — I700 Atherosclerosis of aorta: Secondary | ICD-10-CM | POA: Diagnosis not present

## 2024-07-24 DIAGNOSIS — Z79899 Other long term (current) drug therapy: Secondary | ICD-10-CM | POA: Insufficient documentation

## 2024-07-24 DIAGNOSIS — N400 Enlarged prostate without lower urinary tract symptoms: Secondary | ICD-10-CM | POA: Diagnosis not present

## 2024-07-24 DIAGNOSIS — I5020 Unspecified systolic (congestive) heart failure: Secondary | ICD-10-CM | POA: Diagnosis not present

## 2024-07-24 DIAGNOSIS — I2510 Atherosclerotic heart disease of native coronary artery without angina pectoris: Secondary | ICD-10-CM

## 2024-07-24 DIAGNOSIS — Z87442 Personal history of urinary calculi: Secondary | ICD-10-CM | POA: Insufficient documentation

## 2024-07-24 DIAGNOSIS — K5730 Diverticulosis of large intestine without perforation or abscess without bleeding: Secondary | ICD-10-CM | POA: Insufficient documentation

## 2024-07-24 DIAGNOSIS — I5022 Chronic systolic (congestive) heart failure: Secondary | ICD-10-CM | POA: Diagnosis not present

## 2024-07-24 DIAGNOSIS — E7800 Pure hypercholesterolemia, unspecified: Secondary | ICD-10-CM | POA: Diagnosis not present

## 2024-07-24 DIAGNOSIS — I252 Old myocardial infarction: Secondary | ICD-10-CM | POA: Insufficient documentation

## 2024-07-24 DIAGNOSIS — E119 Type 2 diabetes mellitus without complications: Secondary | ICD-10-CM | POA: Insufficient documentation

## 2024-07-24 DIAGNOSIS — Z7982 Long term (current) use of aspirin: Secondary | ICD-10-CM | POA: Diagnosis not present

## 2024-07-24 DIAGNOSIS — Z87891 Personal history of nicotine dependence: Secondary | ICD-10-CM | POA: Insufficient documentation

## 2024-07-24 DIAGNOSIS — I340 Nonrheumatic mitral (valve) insufficiency: Secondary | ICD-10-CM | POA: Insufficient documentation

## 2024-07-24 HISTORY — DX: Type 2 diabetes mellitus without complications: E11.9

## 2024-07-24 HISTORY — PX: VIDEO BRONCHOSCOPY WITH ENDOBRONCHIAL NAVIGATION: SHX6175

## 2024-07-24 HISTORY — PX: FINE NEEDLE ASPIRATION BIOPSY: CATH118315

## 2024-07-24 HISTORY — PX: VIDEO BRONCHOSCOPY WITH ENDOBRONCHIAL ULTRASOUND: SHX6177

## 2024-07-24 LAB — GLUCOSE, CAPILLARY
Glucose-Capillary: 113 mg/dL — ABNORMAL HIGH (ref 70–99)
Glucose-Capillary: 126 mg/dL — ABNORMAL HIGH (ref 70–99)

## 2024-07-24 SURGERY — VIDEO BRONCHOSCOPY WITH ENDOBRONCHIAL NAVIGATION
Anesthesia: General | Laterality: Bilateral

## 2024-07-24 MED ORDER — PHENYLEPHRINE 80 MCG/ML (10ML) SYRINGE FOR IV PUSH (FOR BLOOD PRESSURE SUPPORT)
PREFILLED_SYRINGE | INTRAVENOUS | Status: DC | PRN
  Administered 2024-07-24 (×3): 80 ug via INTRAVENOUS
  Administered 2024-07-24 (×2): 160 ug via INTRAVENOUS
  Administered 2024-07-24: 80 ug via INTRAVENOUS

## 2024-07-24 MED ORDER — CHLORHEXIDINE GLUCONATE 0.12 % MT SOLN
15.0000 mL | Freq: Once | OROMUCOSAL | Status: AC
  Administered 2024-07-24 (×2): 15 mL via OROMUCOSAL
  Filled 2024-07-24: qty 15

## 2024-07-24 MED ORDER — INSULIN ASPART 100 UNIT/ML IJ SOLN: 0.0000 [IU] | INTRAMUSCULAR | Status: DC | PRN

## 2024-07-24 MED ORDER — ACETAMINOPHEN 10 MG/ML IV SOLN: 1000.0000 mg | Freq: Once | INTRAVENOUS | Status: DC | PRN

## 2024-07-24 MED ORDER — SUGAMMADEX SODIUM 200 MG/2ML IV SOLN
INTRAVENOUS | Status: DC | PRN
  Administered 2024-07-24 (×4): 200 mg via INTRAVENOUS

## 2024-07-24 MED ORDER — OXYCODONE HCL 5 MG/5ML PO SOLN: 5.0000 mg | Freq: Once | ORAL | Status: DC | PRN

## 2024-07-24 MED ORDER — PROPOFOL 500 MG/50ML IV EMUL
INTRAVENOUS | Status: DC | PRN
  Administered 2024-07-24 (×2): 100 ug/kg/min via INTRAVENOUS

## 2024-07-24 MED ORDER — ONDANSETRON HCL 4 MG/2ML IJ SOLN: 4.0000 mg | Freq: Once | INTRAMUSCULAR | Status: DC | PRN

## 2024-07-24 MED ORDER — FENTANYL CITRATE (PF) 100 MCG/2ML IJ SOLN
INTRAMUSCULAR | Status: AC
  Filled 2024-07-24: qty 2

## 2024-07-24 MED ORDER — ROCURONIUM BROMIDE 10 MG/ML (PF) SYRINGE
PREFILLED_SYRINGE | INTRAVENOUS | Status: DC | PRN
  Administered 2024-07-24: 60 mg via INTRAVENOUS
  Administered 2024-07-24 (×2): 10 mg via INTRAVENOUS
  Administered 2024-07-24: 60 mg via INTRAVENOUS
  Administered 2024-07-24 (×2): 10 mg via INTRAVENOUS

## 2024-07-24 MED ORDER — PROPOFOL 10 MG/ML IV BOLUS
INTRAVENOUS | Status: DC | PRN
  Administered 2024-07-24: 20 mg via INTRAVENOUS
  Administered 2024-07-24 (×2): 30 mg via INTRAVENOUS
  Administered 2024-07-24 (×4): 20 mg via INTRAVENOUS
  Administered 2024-07-24: 30 mg via INTRAVENOUS
  Administered 2024-07-24: 20 mg via INTRAVENOUS
  Administered 2024-07-24: 30 mg via INTRAVENOUS

## 2024-07-24 MED ORDER — ONDANSETRON HCL 4 MG/2ML IJ SOLN
INTRAMUSCULAR | Status: DC | PRN
  Administered 2024-07-24 (×2): 4 mg via INTRAVENOUS

## 2024-07-24 MED ORDER — LACTATED RINGERS IV SOLN: INTRAVENOUS | Status: DC

## 2024-07-24 MED ORDER — FENTANYL CITRATE (PF) 250 MCG/5ML IJ SOLN
INTRAMUSCULAR | Status: DC | PRN
  Administered 2024-07-24 (×2): 100 ug via INTRAVENOUS

## 2024-07-24 MED ORDER — OXYCODONE HCL 5 MG PO TABS: 5.0000 mg | ORAL_TABLET | Freq: Once | ORAL | Status: DC | PRN

## 2024-07-24 MED ORDER — LIDOCAINE 2% (20 MG/ML) 5 ML SYRINGE
INTRAMUSCULAR | Status: DC | PRN
  Administered 2024-07-24 (×2): 100 mg via INTRAVENOUS

## 2024-07-24 MED ORDER — FENTANYL CITRATE (PF) 100 MCG/2ML IJ SOLN: 25.0000 ug | INTRAMUSCULAR | Status: DC | PRN

## 2024-07-24 SURGICAL SUPPLY — 36 items

## 2024-07-24 NOTE — Op Note (Signed)
 Procedure Note  Patient: Juan Norris  [employer] Cios mobile C-arm was utilized to identify and biopsy right lower lobe superior segment nodule.  Multiple passes were taken using C arm guidance.  Due to nodule location and difficult approach needle-in-lesion was never able to be fully established.  Needle tip was able to be localized 2 to 3 mm adjacent to the nodule.  Representative images were uploaded to PACS. Needle-in-lesion was confirmed using real-time Cios imaging, and images were uploaded to PACS.   Lamar Chris, MD, PhD 07/24/2024, 1:08 PM [facility] [phone] or if no answer before 7:00PM call [phone] For any issues after 7:00PM please call eLink [phone]

## 2024-07-24 NOTE — Interval H&P Note (Signed)
 History and Physical Interval Note:  07/24/2024 9:13 AM  Juan Norris  has presented today for surgery, with the diagnosis of lung nodule.  The various methods of treatment have been discussed with the patient and family. After consideration of risks, benefits and other options for treatment, the patient has consented to  Procedure(s): VIDEO BRONCHOSCOPY WITH ENDOBRONCHIAL NAVIGATION (Bilateral) BRONCHOSCOPY, WITH EBUS as a surgical intervention.  [name] history has been reviewed, patient examined.  He was seen in the ED and briefly admitted 8/9-8/10 for diplopia, ataxia and headache.  His MRI of the brain was stable.  He did receive corticosteroids, pain medication.  His symptoms have resolved and he is not experiencing any diplopia or headache now.  I believe that he is stable for surgery.  I have reviewed the patient's chart and labs.  Questions were answered to the patient's satisfaction.     [name]

## 2024-07-24 NOTE — Anesthesia Postprocedure Evaluation (Signed)
 Anesthesia Post Note  Patient: Juan Norris  Procedure(s) Performed: VIDEO BRONCHOSCOPY WITH ENDOBRONCHIAL NAVIGATION (Bilateral) BRONCHOSCOPY, WITH EBUS FINE NEEDLE ASPIRATION BIOPSY     Patient location during evaluation: PACU Anesthesia Type: General Level of consciousness: awake and alert Pain management: pain level controlled Vital Signs Assessment: post-procedure vital signs reviewed and stable Respiratory status: spontaneous breathing, nonlabored ventilation, respiratory function stable and patient connected to nasal cannula oxygen Cardiovascular status: blood pressure returned to baseline and stable Postop Assessment: no apparent nausea or vomiting Anesthetic complications: no   No notable events documented.  Last Vitals:  Vitals:   07/24/24 1330 07/24/24 1345  BP: (!) 108/42 (!) 115/42  Pulse: 60 60  Resp: 17 17  Temp:  36.6 C  SpO2: 93% 93%    Last Pain:  Vitals:   07/24/24 1330  TempSrc:   PainSc: Asleep                 Lynwood MARLA Cornea

## 2024-07-24 NOTE — Op Note (Signed)
 Video Bronchoscopy with Endobronchial Ultrasound and Electromagnetic Navigation Procedure Note  Date of Operation: 07/24/2024  Pre-op Diagnosis: Right lower lobe nodule, mediastinal adenopathy  Post-op Diagnosis: Same  Surgeon: LAMAR CHRIS  Assistants: None  Anesthesia: General endotracheal anesthesia  Operation: Flexible video fiberoptic bronchoscopy with endobronchial ultrasound, robotic assisted navigation and biopsies.  Estimated Blood Loss: Minimal  Complications: None apparent  Indications and History: Juan Norris is a 73 y.o. male with history of non-small cell lung cancer.  Found to have a suspicious superior segment right lower lobe nodule in the paraspinal region on surveillance imaging.  Recommendation was made to achieve a tissue diagnosis using endobronchial ultrasound and robotic assisted navigational bronchoscopy. The risks, benefits, complications, treatment options and expected outcomes were discussed with the patient.  The possibilities of pneumothorax, pneumonia, reaction to medication, pulmonary aspiration, perforation of a viscus, bleeding, failure to diagnose a condition and creating a complication requiring transfusion or operation were discussed with the patient who freely signed the consent.    Description of Procedure: The patient was seen in the Preoperative Area, was examined and was deemed appropriate to proceed.  The patient was taken to [facility] Endoscopy room 3, identified as CHARLI LIBERATORE and the procedure verified as Flexible Video Fiberoptic Bronchoscopy with robotic assisted navigation and endobronchial ultrasound.  A Time Out was held and the above information confirmed.   Robotic assisted navigation: Prior to the date of the procedure a high-resolution CT scan of the chest was performed. Utilizing ION software program a virtual tracheobronchial tree was generated to allow the creation of distinct navigation pathways to the patient's parenchymal abnormalities.  After being taken to the operating room general anesthesia was initiated and the patient  was orally intubated. The video fiberoptic bronchoscope was introduced via the endotracheal tube and a general inspection was performed which showed normal right and left lung anatomy. Aspiration of the bilateral mainstems was completed to remove any remaining secretions. Robotic catheter inserted into patient's endotracheal tube.   Target #1 right lower lobe superior segment nodule: The distinct navigation pathways prepared prior to this procedure were then utilized to navigate to patient's lesion identified on CT scan.  Navigation was challenging based on the nodules position relative to airways.  The robotic catheter was secured into place and the vision probe was withdrawn.  Lesion location was approximated using fluoroscopy.  Local registration and targeting was performed using [employer] Cios mobile C-arm three-dimensional imaging.  Needle approach was taken to Awe through adjacent large airway wall into the nodule.  Despite multiple passes under fluoro guidance needle-in-lesion was never able to be established on Cios-spin.  We were able to approach within a few millimeters of the nodule.  Samples were pooled. Based on navigation difficulty, suspect that sensitivity of this procedure is low.   Endobronchial ultrasound: The robotic scope was then withdrawn and the endobronchial ultrasound was used to identify and characterize the peritracheal, hilar and bronchial lymph nodes. Inspection showed pathologically enlarged nodes.  There was a 1 cm node at station 4L that could be approached. Using real-time ultrasound guidance Wang needle biopsies were take from Station 4L node and was sent for cytology.   At the end of the procedure a general airway inspection was performed and there was no evidence of active bleeding. The bronchoscope was removed.  The patient tolerated the procedure well. There was no  significant blood loss and there were no obvious complications. A post-procedural chest x-ray is pending.  Samples Target #1:  1. Transbronchial Wang needle biopsies from right lower lobe nodule  EBUS Samples: 1. Wang needle biopsies from 4L node   Lamar Chris, MD, PhD 07/24/2024, 12:56 PM [facility]

## 2024-07-24 NOTE — Transfer of Care (Signed)
 Immediate Anesthesia Transfer of Care Note  Patient: Juan Norris  Procedure(s) Performed: VIDEO BRONCHOSCOPY WITH ENDOBRONCHIAL NAVIGATION (Bilateral) BRONCHOSCOPY, WITH EBUS FINE NEEDLE ASPIRATION BIOPSY  Patient Location: PACU  Anesthesia Type:General  Level of Consciousness: drowsy and patient cooperative  Airway & Oxygen Therapy: Patient Spontanous Breathing and Patient connected to face mask oxygen  Post-op Assessment: Report given to RN and Post -op Vital signs reviewed and stable  Post vital signs: Reviewed and stable  Last Vitals:  Vitals Value Taken Time  BP 132/84   Temp 98.1   Pulse 90   Resp 18   SpO2 93     Last Pain:  Vitals:   07/24/24 0934  TempSrc:   PainSc: 0-No pain         Complications: No notable events documented.

## 2024-07-24 NOTE — Discharge Instructions (Addendum)
 Flexible Bronchoscopy, Care After This sheet gives you information about how to care for yourself after your test. Your doctor may also give you more specific instructions. If you have problems or questions, contact your doctor. Follow these instructions at home: Eating and drinking When you are wide awake, your numbness is gone and your cough and gag reflexes have come back, you may: Start eating only soft foods. Slowly drink liquids. Six hours after the test, go back to your normal diet. Driving Do not drive for 24 hours if you were given a medicine to help you relax (sedative). Do not drive or use heavy machinery while taking prescription pain medicine. General instructions Take over-the-counter and prescription medicines only as told by your doctor. Return to your normal activities as told. Ask what activities are safe for you. Do not use any products that have nicotine  or tobacco in them. This includes cigarettes and e-cigarettes. If you need help quitting, ask your doctor. Keep all follow-up visits as told by your doctor. This is important. It is very important if you had a tissue sample (biopsy) taken. Get help right away if: You have shortness of breath that gets worse. You get light-headed. You feel like you are going to [place] out (faint). You have chest pain. You cough up: More than a little blood. More blood than before. Summary Do not use cigarettes. Do not use e-cigarettes. Seek care in the Emergency Department right away if you have chest pain or shortness of breath. Call or MyChart Message our office for any questions or problems at [phone].  Okay to restart aspirin  on 07/25/2024.   This information is not intended to replace advice given to you by your health care provider. Make sure you discuss any questions you have with your health care provider.

## 2024-07-24 NOTE — Anesthesia Procedure Notes (Signed)
 Procedure Name: Intubation Date/Time: 07/24/2024 11:19 AM  Performed by: Keneth Lynwood POUR, MDPre-anesthesia Checklist: Patient identified, Emergency Drugs available, Suction available and Patient being monitored Patient Re-evaluated:Patient Re-evaluated prior to induction Oxygen Delivery Method: Circle system utilized Preoxygenation: Pre-oxygenation with 100% oxygen Induction Type: IV induction Ventilation: Mask ventilation without difficulty Laryngoscope Size: Mac and 4 Grade View: Grade I Tube type: Oral Tube size: 8.5 mm Number of attempts: 1 Airway Equipment and Method: Stylet and Oral airway Placement Confirmation: ETT inserted through vocal cords under direct vision, positive ETCO2 and breath sounds checked- equal and bilateral Secured at: 23 cm Tube secured with: Tape Dental Injury: Teeth and Oropharynx as per pre-operative assessment

## 2024-07-25 ENCOUNTER — Encounter (HOSPITAL_COMMUNITY): Payer: Self-pay | Admitting: Emergency Medicine

## 2024-07-26 ENCOUNTER — Other Ambulatory Visit: Payer: Self-pay | Admitting: Radiation Therapy

## 2024-07-26 ENCOUNTER — Ambulatory Visit
Admission: RE | Admit: 2024-07-26 | Discharge: 2024-07-26 | Disposition: A | Discharge status: Home / Self Care | Source: Ambulatory Visit | Attending: Urology | Admitting: Urology

## 2024-07-26 DIAGNOSIS — C7931 Secondary malignant neoplasm of brain: Secondary | ICD-10-CM

## 2024-07-26 LAB — CYTOLOGY - NON PAP

## 2024-07-26 NOTE — Progress Notes (Incomplete)
 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 Metastatic cancer to brain [facility])  C79.31        HISTORY OF PRESENT ILLNESS: Juan Norris is a 73 y.o. 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 Shannon from 11/03/21 through 11/14/21.  Follow-up CT chest imaging had subsequently remained stable without evidence of progression or recurrence through April 2023 when he last saw Dr. Shannon.  The plan at that time was for a follow-up scan in 6 months but he was lost to follow-up at the cancer center thereafter, apparently being followed with serial CT chest scans with Dr. Maranda, in medical oncology at the [facility].  Reportedly, his scans had remained without evidence of disease recurrence or progression as of his last scan in August 2024.  He presented to the emergency department on 11/12/2023 with complaints of diplopia and dizziness for 2 days.  A CT angio head and neck was performed on admission and showed masses in the left occipital lobe and right posterior fossa with significant edema and mass effect with effacement of the fourth ventricle.  Also noted was new nodularity and pleural thickening in the left upper lobe lung.  He was started on dexamethasone  4 mg p.o. 3 times daily and has had resolution of the diplopia and dizziness since starting the  steroids.  A CT C/A/P was performed on 11/13/2023 and showed a stable, smaller appearance of the treated RLL nodule but a new 1.3 cm LUL nodule and a 5 mm RUL nodule.  Additionally, a 2 cm enhancing liver lesion was noted.  A CT head showed a 2.6 cm peripherally enhancing mass in the left occipital cortex and a 3.5 cm enhancing mass in the inferior right cerebellum.  MRI abdomen was performed on 11/15/2023 for further evaluation of the liver lesion and suggests this lesion is incidental, benign focal nodular hyperplasia rather than a metastasis.    An MRI brain scan on 11/15/2023 confirmed peripherally enhancing, partially cystic masses in the left occipital lobe and right cerebellum, consistent with metastatic disease. The right cerebellar mass was causing associated mass effect on the fourth ventricle, right pons, and right medulla. No hydrocephalus. Dr. Joshua, of neurosurgery, was consulted and recommended fractionated stereotactic radiosurgery as opposed to surgical resection. He subsequently completed 3 fractions of SRS to the brain under the care of Dr. Patrcia on 11/19/23, 11/22/23, and 11/24/23.   Subsequently, he underwent restaging PET scan on 11/29/23 showing an 11 mm subpleural nodule in the lateral LUL, suspicious for recurrence/metastasis vs primary bronchogenic carcinoma as well as a 4 mm subpleural nodule in RUL, beneath size threshold for PET sensitivity. The hepatic lesions were non-FDG-avid, favoring suspected benign etiology. He was referred back to pulmonology and proceeded with bronchoscopy with biopsies on 12/27/23 under the care of  Dr. Shelah. Cytology from both samples taken from the LUL lung nodule confirmed NSCLC, adenocarcinoma.  We saw him back in the clinic on 01/07/24 and he elected to proceed with the recommended 3-5 fraction course of stereotactic body radiotherapy (SBRT) to the new, left upper lobe lung nodule; completed 01/27/24.   He tolerated the treatments well and remains without  complaints regarding the chest and brain radiation. I spoke with the patient to conduct his routine scheduled follow up visit to review results of his recent short-interval brain MRI scan via telephone to spare the patient unnecessary potential exposure in the healthcare setting  The patient was notified in advance and gave permission to proceed with this visit format.  He reports that he is doing fair in general and specifically denies any fevers, chills, productive cough, hemoptysis, increased shortness of breath, chest pain, N/V or focal weakness. He continues his routine follow up with Dr. Geneva in medical oncology at the [facility] for continued management of his systemic disease and had a restaging CT Chest on 05/30/24 and PET imaging on 06/23/24. He had a bronchoscopy for biopsy of RLL nodule and 4L lymph node, currently considering starting on maintenance systemic therapy pending final results from pathology and molecular profile.  The post-treatment MRI brain scan from 05/24/2024 showed an overall decrease in size of the 2 treated brain lesions in the right cerebellum and left occipital lobe and no new metastases identified but, there was some marginal interval enlargement of the treated right cerebellar lesion as compared to the previous scan in March 2025, felt to be most consistent with treatment effect so the recommendation was to repeat a short interval MRI brain scan in 2 months for close monitoring.  In the interim, he has been seen in the ED and admitted to the hospital on several occasions in July and August, with complaints of headaches, diplopia and dizziness. He had a CT Head and MRI brain scan 07/12/24 for evaluation of new onset severe headache and these scans showed some increased edema with mass effect on the fourth ventricle as well as marginal interval enlargement (on the scale of a few mm) of the treated right cerebellar and left occipital lesions as compared to the previous scans from 02/2024 and  05/2024, likely consistent with treatment effect.  Fortunately, there were no new metastases identified. He was given IV dexamethasone  during hospital admission but was not discharged on any steroids.  His routine scheduled short interval MRI brain scan was performed on 07/18/24 and appeared stable as compared to the 07/12/24 MRI scan.  He presented back to the ED on 07/22/24 with complaints of dizziness and diplopia, again treated with IV dexamethasone  but not discharged home on any steroids. We reviewed the results of his recent MRI brain scans by telephone today.  Since we last saw him, he has also had navigational bronchoscopy for tissue biopsy of a RLL lung nodule and 4L lymph node under the care of Dr. Shelah on 07/24/24, pathology remains pending. He continues in routine follow up with his medical oncologist, Dr. Geneva, at the [facility].  PREVIOUS RADIATION THERAPY: Yes  01/20/24 - 01/27/24: The LUL lung nodule was treated to 54 Gy in 3 fractions of 18 Gy each.  11/19/23, 11/22/23, 11/24/23: Brain SRS / 27 Gy in 3 fractions  11/03/2021 through 11/14/2021 Site Technique Total Dose (Gy) Dose per Fx (Gy) Completed Fx Beam Energies  Lung, Right: Lung_Rt IMRT 60/60 12 5/5 6XFFF     PAST MEDICAL HISTORY:  Past Medical History:  Diagnosis Date   Acute renal insufficiency    AICD (automatic cardioverter/defibrillator) present    Anginal pain (HCC)    CHF (congestive heart failure) (HCC)    Chronic lower back pain    due to 3 ruptured discs in my back (11/08/2018)   Diabetes mellitus without complication (HCC)    High cholesterol    High cholesterol    History of kidney stones 1982   passed them   History of radiation therapy    Right lung- 11/03/21-11/14/21- Dr. Lynwood Nay   Hypertension    Lung cancer [facility])    Myocardial infarction (HCC) 03/2018   Systolic heart failure (HCC)    Tobacco use       PAST SURGICAL HISTORY: Past Surgical History:  Procedure Laterality Date   BIV ICD INSERTION  CRT-D N/A 11/08/2018    Medtronic biventricular ICD implantation by Dr Kelsie with His Bundle pacing and a MDT 3830 lead placed into the LV port.    BRONCHIAL BIOPSY  10/13/2021   Procedure: BRONCHIAL BIOPSIES;  Surgeon: Shelah Lamar RAMAN, MD;  Location: [facility] ENDOSCOPY;  Service: Pulmonary;;   BRONCHIAL BIOPSY  12/27/2023   Procedure: BRONCHIAL BIOPSIES;  Surgeon: Shelah Lamar RAMAN, MD;  Location: [facility] ENDOSCOPY;  Service: Pulmonary;;   BRONCHIAL BRUSHINGS  10/13/2021   Procedure: BRONCHIAL BRUSHINGS;  Surgeon: Shelah Lamar RAMAN, MD;  Location: [facility] ENDOSCOPY;  Service: Pulmonary;;   BRONCHIAL BRUSHINGS  12/27/2023   Procedure: BRONCHIAL BRUSHINGS;  Surgeon: Shelah Lamar RAMAN, MD;  Location: [facility] ENDOSCOPY;  Service: Pulmonary;;   BRONCHIAL NEEDLE ASPIRATION BIOPSY  10/13/2021   Procedure: BRONCHIAL NEEDLE ASPIRATION BIOPSIES;  Surgeon: Shelah Lamar RAMAN, MD;  Location: [facility] ENDOSCOPY;  Service: Pulmonary;;   BRONCHIAL NEEDLE ASPIRATION BIOPSY  12/27/2023   Procedure: BRONCHIAL NEEDLE ASPIRATION BIOPSIES;  Surgeon: Shelah Lamar RAMAN, MD;  Location: [facility] ENDOSCOPY;  Service: Pulmonary;;   BRONCHIAL WASHINGS  10/13/2021   Procedure: BRONCHIAL WASHINGS;  Surgeon: Shelah Lamar RAMAN, MD;  Location: [facility] ENDOSCOPY;  Service: Pulmonary;;   CARDIAC CATHETERIZATION     FIDUCIAL MARKER PLACEMENT  10/13/2021   Procedure: FIDUCIAL MARKER PLACEMENT;  Surgeon: Shelah Lamar RAMAN, MD;  Location: [facility] ENDOSCOPY;  Service: Pulmonary;;   FIDUCIAL MARKER PLACEMENT  12/27/2023   Procedure: FIDUCIAL MARKER PLACEMENT;  Surgeon: Shelah Lamar RAMAN, MD;  Location: [facility] ENDOSCOPY;  Service: Pulmonary;;   FINE NEEDLE ASPIRATION BIOPSY  07/24/2024   Procedure: FINE NEEDLE ASPIRATION BIOPSY;  Surgeon: Shelah Lamar RAMAN, MD;  Location: [facility] ENDOSCOPY;  Service: Pulmonary;;   INGUINAL HERNIA REPAIR Bilateral    IR IMAGING GUIDED PORT INSERTION  11/15/2023   KNEE SURGERY Left 1990s   growth on the back of my knee removed   LEFT HEART CATH AND CORONARY  ANGIOGRAPHY N/A 03/30/2018   Procedure: LEFT HEART CATH AND CORONARY ANGIOGRAPHY;  Surgeon: Verlin Lonni BIRCH, MD;  Location: [facility] INVASIVE CV LAB;  Service: Cardiovascular;  Laterality: N/A;   VIDEO BRONCHOSCOPY WITH ENDOBRONCHIAL NAVIGATION N/A 10/13/2021   Procedure: ROBOTIC VIDEO BRONCHOSCOPY WITH ENDOBRONCHIAL NAVIGATION;  Surgeon: Shelah Lamar RAMAN, MD;  Location: [facility] ENDOSCOPY;  Service: Pulmonary;  Laterality: N/A;   VIDEO BRONCHOSCOPY WITH ENDOBRONCHIAL NAVIGATION Bilateral 07/24/2024   Procedure: VIDEO BRONCHOSCOPY WITH ENDOBRONCHIAL NAVIGATION;  Surgeon: Shelah Lamar RAMAN, MD;  Location: [facility] ENDOSCOPY;  Service: Pulmonary;  Laterality: Bilateral;   VIDEO BRONCHOSCOPY WITH ENDOBRONCHIAL ULTRASOUND  07/24/2024   Procedure: BRONCHOSCOPY, WITH EBUS;  Surgeon: Shelah Lamar RAMAN, MD;  Location: [facility] ENDOSCOPY;  Service: Pulmonary;;  VIDEO BRONCHOSCOPY WITH RADIAL ENDOBRONCHIAL ULTRASOUND  10/13/2021   Procedure: RADIAL ENDOBRONCHIAL ULTRASOUND;  Surgeon: Shelah Lamar RAMAN, MD;  Location: [facility] ENDOSCOPY;  Service: Pulmonary;;    FAMILY HISTORY:  Family History  Problem Relation Age of Onset   Leukemia Mother    Cancer Father    Cancer Sister    Cancer Brother    CAD Brother     SOCIAL HISTORY:  Social History   Socioeconomic History   Marital status: Divorced    Spouse name: Not on file   Number of children: 1   Years of education: Not on file   Highest education level: Not on file  Occupational History   Not on file  Tobacco Use   Smoking status: Former    Current packs/day: 1.50    Average packs/day: 1.5 packs/day for 47.0 years (70.5 ttl pk-yrs)    Types: Cigarettes   Smokeless tobacco: Former   Tobacco comments:    Pt smokes 1 cig weekly when something bothers him. AB, CMA 07-10-24        1 cigarette smoked daily 05/28/22 ARJ   Vaping Use   Vaping status: Never Used  Substance and Sexual Activity   Alcohol use: Not Currently    Comment: 11/08/2018 stopped 09/23/1998    Drug use: Not Currently    Comment: 11/08/2018 nothing in the 2000s   Sexual activity: Yes  Other Topics Concern   Not on file  Social History Narrative   Retired   Chief Executive Officer Drivers of Corporate investment banker Strain: Not on file  Food Insecurity: No Food Insecurity (07/22/2024)   Hunger Vital Sign    Worried About Running Out of Food in the Last Year: Never true    Ran Out of Food in the Last Year: Never true  Transportation Needs: Unmet Transportation Needs (07/22/2024)   PRAPARE - Administrator, Civil Service (Medical): Yes    Lack of Transportation (Non-Medical): Yes  Physical Activity: Not on file  Stress: Not on file  Social Connections: Socially Integrated (07/22/2024)   Social Connection and Isolation Panel    Frequency of Communication with Friends and Family: Three times a week    Frequency of Social Gatherings with Friends and Family: Three times a week    Attends Religious Services: More than 4 times per year    Active Member of Clubs or Organizations: No    Attends Banker Meetings: 1 to 4 times per year    Marital Status: Married  Catering manager Violence: Not At Risk (07/22/2024)   Humiliation, Afraid, Rape, and Kick questionnaire    Fear of Current or Ex-Partner: No    Emotionally Abused: No    Physically Abused: No    Sexually Abused: No    ALLERGIES: Patient has no known allergies.  MEDICATIONS:  Current Outpatient Medications  Medication Sig Dispense Refill   aspirin  81 MG EC tablet Take 1 tablet (81 mg total) by mouth daily. 30 tablet 6   atorvastatin  (LIPITOR ) 40 MG tablet Take 1 tablet (40 mg total) by mouth at bedtime.     carvedilol  (COREG ) 25 MG tablet Take 25 mg by mouth daily.     Empagliflozin-metFORMIN  HCl 12.5-500 MG TABS Take 1 tablet by mouth daily.     ferrous sulfate 325 (65 FE) MG tablet Take 325 mg by mouth daily with breakfast.     furosemide  (LASIX ) 20 MG tablet Take 1 tablet (20 mg total) by mouth daily. As  directed may take 1 additional tablet by mouth daily only as needed for weight gain of 2 lbs in 24 hours or 5 lbs in a week 120 tablet 3   Multiple Vitamin (MULTIVITAMIN WITH MINERALS) TABS tablet Take 1 tablet by mouth daily.     sacubitril -valsartan  (ENTRESTO ) 24-26 MG Take 1 tablet by mouth 2 (two) times daily.     No current facility-administered medications for this encounter.    REVIEW OF SYSTEMS:  On review of systems, the patient reports that he is doing well overall. He denies any chest pain, shortness of breath, cough, fevers, chills, night sweats, unintended weight changes. He denies any bowel or bladder disturbances, and denies abdominal pain, nausea or vomiting. He denies any new musculoskeletal or joint aches or pains. A complete review of systems is obtained and is otherwise negative.    PHYSICAL EXAM:  Wt Readings from Last 3 Encounters:  07/24/24 151 lb (68.5 kg)  07/23/24 147 lb 4.8 oz (66.8 kg)  07/11/24 150 lb 9.6 oz (68.3 kg)   Temp Readings from Last 3 Encounters:  07/24/24 97.8 F (36.6 C)  07/23/24 (!) 97.5 F (36.4 C) (Oral)  07/12/24 (!) 97.5 F (36.4 C) (Oral)   BP Readings from Last 3 Encounters:  07/24/24 (!) 115/42  07/23/24 (!) 132/49  07/12/24 128/70   Pulse Readings from Last 3 Encounters:  07/24/24 60  07/23/24 64  07/12/24 72    /10  Unable to assess due to telephone follow up visit format.     KPS = 100  100 - Normal; no complaints; no evidence of disease. 90   - Able to carry on normal activity; minor signs or symptoms of disease. 80   - Normal activity with effort; some signs or symptoms of disease. 28   - Cares for self; unable to carry on normal activity or to do active work. 60   - Requires occasional assistance, but is able to care for most of his personal needs. 50   - Requires considerable assistance and frequent medical care. 40   - Disabled; requires special care and assistance. 30   - Severely disabled; hospital admission  is indicated although death not imminent. 20   - Very sick; hospital admission necessary; active supportive treatment necessary. 10   - Moribund; fatal processes progressing rapidly. 0     - Dead  Karnofsky DA, Abelmann WH, Craver LS and Burchenal JH [phone]) The use of the nitrogen mustards in the palliative treatment of carcinoma: with particular reference to bronchogenic carcinoma Cancer 1 634-56  LABORATORY DATA:  Lab Results  Component Value Date   WBC 9.2 07/22/2024   HGB 14.1 07/22/2024   HCT 43.9 07/22/2024   MCV 90.3 07/22/2024   PLT 205 07/22/2024   Lab Results  Component Value Date   NA 137 07/23/2024   K 4.0 07/23/2024   CL 108 07/23/2024   CO2 19 (L) 07/23/2024   Lab Results  Component Value Date   ALT 19 07/22/2024   AST 24 07/22/2024   ALKPHOS 76 07/22/2024   BILITOT 1.0 07/22/2024     RADIOGRAPHY: DG Chest Port 1 View Result Date: 07/24/2024 CLINICAL DATA:  Post bronchoscopy with biopsy. EXAM: PORTABLE CHEST 1 VIEW COMPARISON:  12/27/2023 FINDINGS: Left AICD and right Port-A-Cath remain in place, unchanged. Hyperinflation/emphysema. No pneumothorax. Patchy opacity in the right mid lung and left lower lobe. No visible effusions. IMPRESSION: No visible pneumothorax following bronchoscopy. Patchy right mid lung and left lower lobe  airspace opacities which could reflect infiltrates or related to bronchoscopy. Electronically Signed   By: Franky Crease M.D.   On: 07/24/2024 14:15   DG C-ARM BRONCHOSCOPY Result Date: 07/24/2024 C-ARM BRONCHOSCOPY: Fluoroscopy was utilized by the requesting physician.  No radiographic interpretation.   CT ANGIO HEAD W OR WO CONTRAST Result Date: 07/23/2024 EXAM: CTA Head Without and with Intravenous Contrast CLINICAL HISTORY: Headache, sudden, severe. Scan due to sudden severe headache. Pt c/o diplopia. Known brain metastases. TECHNIQUE: Axial CTA images of the head without and with intravenous contrast. MIP reconstructed images were  created and reviewed. Dose reduction technique was used including one or more of the following: automated exposure control, adjustment of mA and kV according to patient size, and/or iterative reconstruction. CONTRAST: Without and with; 75 mL of iohexol  (OMNIPAQUE ) 350 MG/ML injection. COMPARISON: None provided. FINDINGS: INTERNAL CAROTID ARTERIES: The intracranial ICAs are patent with no significant stenosis. No occlusion. No aneurysm. Atherosclerotic calcifications are present within the cavernous internal carotid arteries bilaterally without significant stenosis relative to the ICA turbinate. ANTERIOR CEREBRAL ARTERIES: No significant stenosis. No occlusion. No aneurysm. MIDDLE CEREBRAL ARTERIES: No significant stenosis. No occlusion. No aneurysm. POSTERIOR CEREBRAL ARTERIES: No significant stenosis. No occlusion. No aneurysm. BASILAR ARTERY: No significant stenosis. No occlusion. No aneurysm. VERTEBRAL ARTERIES: No significant stenosis. No occlusion. No aneurysm. SOFT TISSUES: No acute finding. No masses or lymphadenopathy. BONES: No acute osseous abnormality. BRAIN: Areas of hypoattenuation and vasogenic edema in the left occipital lobe and inferior right cerebellum are stable, consistent with known brain metastases. No new lesions are evident. IMPRESSION: 1. No evidence of significant stenosis, aneurysmal dilatation, or dissection involving the arteries of the head. 2. Stable areas of hypoattenuation and vasogenic edema in the left occipital lobe and inferior right cerebellum, consistent with known brain metastases. No new lesions are evident. Electronically signed by: Lonni Necessary MD 07/23/2024 11:02 AM EDT RP Workstation: [id number]   CT Head Wo Contrast Result Date: 07/22/2024 CLINICAL DATA:  Brain metastases, assess treatment response. Neuro deficit, acute, stroke suspected. Double vision EXAM: CT HEAD WITHOUT CONTRAST TECHNIQUE: Contiguous axial images were obtained from the base of the skull  through the vertex without intravenous contrast. RADIATION DOSE REDUCTION: This exam was performed according to the departmental dose-optimization program which includes automated exposure control, adjustment of the mA and/or kV according to patient size and/or use of iterative reconstruction technique. COMPARISON:  07/11/2024, MRI 07/18/2024 FINDINGS: Brain: Mild parenchymal volume loss is commensurate with the patient's age. Mild periventricular white matter changes are present likely reflecting the sequela of small vessel ischemia. Vasogenic edema is again seen within the left occipital lobe and right cerebellar hemisphere related to known cerebral metastases in these locations. The associated mass effect with partial effacement of the fourth ventricle and mild right-to-left deviation of the cerebellar vermis, and minimal effacement of the left lateral ventricle occipital horn is stable since prior examination. Known cerebral metastases themselves are not well visualized on this noncontrast examination. No midline shift. No acute intracranial hemorrhage or infarct. Ventricular size is normal. No intra or extra-axial fluid collection identified. Vascular: No hyperdense vessel or unexpected calcification. Skull: Normal. Negative for fracture or focal lesion. Sinuses/Orbits: No acute finding. Other: Mastoid air cells and middle ear cavities are clear. IMPRESSION: 1. No acute intracranial hemorrhage or infarct. 2. Stable vasogenic edema within the left occipital lobe and right cerebellar hemisphere related to known cerebral metastases in these locations. Stable associated mass effect with partial effacement of the right ventricle and minimal  effacement of the left lateral ventricle occipital horn. Known cerebral metastases themselves are not well visualized on this noncontrast examination. Electronically Signed   By: Dorethia Molt M.D.   On: 07/22/2024 20:11   CT Super D Chest Wo Contrast Result Date:  07/21/2024 CLINICAL DATA:  Malignant neoplasm of right lower lobe with brain metastasis EXAM: CT CHEST WITHOUT CONTRAST TECHNIQUE: Multidetector CT imaging of the chest was performed using thin slice collimation for electromagnetic bronchoscopy planning purposes, without intravenous contrast. RADIATION DOSE REDUCTION: This exam was performed according to the departmental dose-optimization program which includes automated exposure control, adjustment of the mA and/or kV according to patient size and/or use of iterative reconstruction technique. COMPARISON:  PET-CT June 23, 2024 FINDINGS: Cardiovascular: The heart size is normal. Atherosclerotic calcifications of coronary arteries. To a lead pacer in place with the leads in right atrium and ventricle. Right-sided porta catheter tip terminates in distal SVC. No pericardial fluid. Mediastinum/Nodes: Multiple subcentimeter and pericentimeter mediastinal lymph nodes. Lungs/pleura: Upper lobe predominant centrilobular emphysematous changes. Few bulla measuring up to 5 cm in left apex and right lower lobe. Right lung base interlobular septal thickening. No significant honeycombing. There is a right lower lobe posteromedial lobulated nodule measuring 1.5 x 1.2 cm, stable to prior. A fiducial marker is identified adjacent to this lesion. Left upper lobe subpleural spiculated nodule measuring 2.5 x 1.3 cm, stable to prior. Fiducial marker identified adjacent to this lesion. Right upper lobe 6 mm nodule, stable to prior (4/49). There is bronchial and bronchiolar wall thickening. Ground-glass attenuation and scarring along the right lower lobe site of known malignancy likely posttreatment changes. No new nodule.  No pleural effusion. Upper Abdomen: Punctate calcifications of the spleen suggestive of prior granulomatous disease. Musculoskeletal: Bilateral mild gynecomastia. IMPRESSION: Stable appearance of spiculated nodules along the subpleural left upper lobe and posteromedial  right lower lobe with adjacent fiducial markers consistent with known multifocal synchronous bronchogenic malignancies. A right upper lobe 6 mm nodule is stable. No new nodules. Follow-up according to oncology protocols. 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 Scattered calcifications of the spleen indicating prior granulomatous disease (mycobacterial , sarcoidosis). Electronically Signed   By: Megan  Zare M.D.   On: 07/21/2024 19:10   MR Brain W Wo Contrast Result Date: 07/18/2024 EXAM: MRI BRAIN WITH AND WITHOUT CONTRAST 07/18/2024 10:24:00 AM TECHNIQUE: Multiplanar multisequence MRI of the head/brain was performed with and without the administration of intravenous contrast. COMPARISON: MRI of the head dated 07/32/2025. CLINICAL HISTORY: Brain metastases, assess treatment response; 3T SRS Protocol. Short interval scan to re-evaluate treatment effect. FINDINGS: BRAIN AND VENTRICLES: There are 2 metastatic lesions again demonstrated. A lesion present anteriorly within the right cerebellar hemisphere appears slightly more circumscribed and slightly smaller in overall size and has decreased in maximal dimensions in the interim from 3.3 x 2.5 x 3.0 cm to approximately 3.0 x 2.4 x 2.8 cm. The lesion within the left occipital lobe appears essentially unchanged measuring approximately 16 x 9 x 18 mm. The surrounding cerebral edema appears unchanged. There continues to be partial effacement of the fourth ventricle and mild displacement of the brain stem to the left. There are no additional lesions present. ORBITS: No acute abnormality. SINUSES: There is moderate mucosal disease within the floor of the left maxillary sinus and mild mucosal disease within the right maxillary sinus. BONES AND SOFT TISSUES: Normal bone marrow signal and enhancement. No acute soft tissue abnormality. IMPRESSION: 1. Slight decrease in  size of the right cerebellar hemisphere  metastatic lesion. 2. Stable left occipital lobe metastatic lesion. 3. No new lesions. 4. Unchanged surrounding cerebral edema. 5. Partial effacement of the fourth ventricle and mild displacement of the brain stem to the left. Electronically signed by: evalene coho 07/18/2024 03:12 PM EDT RP Workstation: [id number]   MR Brain W and Wo Contrast Result Date: 07/12/2024 CLINICAL DATA:  73 year old male with metastatic lung cancer. 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 Restaging. EXAM: MRI HEAD WITHOUT AND WITH CONTRAST TECHNIQUE: Multiplanar, multiecho pulse sequences of the brain and surrounding structures were obtained without and with intravenous contrast. CONTRAST:  6.5mL GADAVIST  GADOBUTROL  1 MMOL/ML IV SOLN COMPARISON:  Brain MRI 05/24/2024 and earlier. FINDINGS: Brain: Confluent edema in the right cerebellar hemisphere, not improved since 05/24/2024, moderately progressed since 02/22/2024. Mass effect on the 4th ventricle also appears further progressed since June now on series 6, image 10. Underlying irregularly rim enhancing right cerebellar mass now measures up to 32 mm long axis on series 10, image 21, versus 22 mm in March. And this does appear mildly increased in size from last month also (about 26 mm at that time). Basilar cisterns remain patent. Left occipital lobe treatment site T2/FLAIR hyperintensity which is more stable since March. Residual irregular rim enhancing lesion there measuring 16 mm long axis is perhaps 1-2 mm larger since March and stable from last month. No new cerebral edema identified. No new enhancing lesion identified. No dural thickening or enhancement. No superimposed restricted diffusion to suggest acute infarction. No midline shift, ventriculomegaly, extra-axial collection or acute intracranial hemorrhage. Cervicomedullary junction and pituitary are within normal limits. Scattered nonspecific cerebral white matter T2 and  FLAIR hyperintensity is stable, probably small vessel disease related including in the left superior cerebellum. Chronic microhemorrhage in the posterior left hemisphere series 7, image 63 is stable, along with mild hemosiderin at the site of both metastases. Vascular: Major intracranial vascular flow voids are preserved. Following contrast the major dural venous sinuses are enhancing and appear to be patent. Skull and upper cervical spine: Negative visible cervical spine and spinal cord. Visualized bone marrow signal is within normal limits. Sinuses/Orbits: Stable and negative. Other: Negative visible scalp and face. IMPRESSION: 1. Right cerebellar edema has significantly progressed since March, similar to last month although mass effect on the 4th ventricle has further increased. Underlying irregular rim enhancing metastasis there has increased by 1 cm since March, a few mm larger since June. Basilar cisterns remain patent.  No ventriculomegaly. 2. Considerably more stable treated Left occipital metastasis since March, questionably 1-2 mm larger, with unchanged regional T2/FLAIR hyperintensity. 3. No new metastatic disease identified. Electronically Signed   By: VEAR [place] M.D.   On: 07/12/2024 12:21   CT HEAD WO CONTRAST Result Date: 07/11/2024 CLINICAL DATA:  Headache, sudden, severe. Lung cancer metastatic to brain. EXAM: CT HEAD WITHOUT CONTRAST TECHNIQUE: Contiguous axial images were obtained from the base of the skull through the vertex without intravenous contrast. RADIATION DOSE REDUCTION: This exam was performed according to the departmental dose-optimization program which includes automated exposure control, adjustment of the mA and/or kV according to patient size and/or use of iterative reconstruction technique. COMPARISON:  MRI of the head dated May 24, 2024. FINDINGS: Brain: There is cerebral swelling in the left occipital lobe, corresponding with an area of metastatic disease noted on the previous  study. There is also moderate cerebellar edema within the right cerebellar hemisphere, which appears  to be more extensive than on the previous study. There is worsening effacement of the fourth ventricle. There is no obstructive hydrocephalus or tonsillar herniation. There is mild mass effect upon the pons and there is partial effacement of the prepontine cistern. Vascular: Moderate calcific atheromatous disease within the carotid siphons. Skull: Intact and unremarkable. Sinuses/Orbits: Mild mucosal disease within the floors of the maxillary sinuses bilaterally. Normal orbits. Other: None. IMPRESSION: 1. Interval worsening of swelling of the right cerebellar hemisphere secondary to metastatic disease. There is partial effacement of the fourth ventricle and the prepontine cistern. 2. Left occipital cerebral swelling consistent with metastatic disease, which appears similar to the prior exam. Electronically Signed   By: Evalene Coho M.D.   On: 07/11/2024 12:43      IMPRESSION/PLAN: 1. 73 y.o. 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  His recent MRI brain scan from 07/18/2024 is stable as compared to CT head and MRI brain scans performed on 7/29 and 07/12/2024 respectively.  However, the MRI brain scan from 07/12/2024 did show some increased edema with mass effect on the fourth ventricle as well as marginal interval enlargement of the treated right cerebellar and left occipital lesions as compared to the previous scans, likely consistent with treatment effect.  Fortunately, there were no new metastases identified.  We will plan to review the scans in our multidisciplinary brain and spine conference on 07/31/2024 and will plan to call the patient shortly thereafter with consensus  recommendations.  Since he has been admitted on multiple occasions here recently with complaints of headache, diplopia and dizziness, we will get him in to see Dr. Buckley for further evaluation and management.  He also had a recent CT chest scan at the [facility] on 05/30/2024 and had a scheduled follow-up visit with his medical oncologist at the [facility], Dr. Geneva, who recommended a PET scan for further disease restaging. The PET scan was performed 06/23/24 and showed  a grossly unchanged appearance of the previously treated left upper lobe subpleural mass/consolidation with mild interval decreased metabolic activity. The remainder of the pulmonary nodules, including the right posteromedial paravertebral nodule, are stable and compared to prior but multifocal primary malignancy (adenocarcinoma spectrum) and metastasis are in differential diagnosis so he was scheduled for bronchoscopy for tissue biopsy of the RLL nodule which was performed on 07/24/24, under the care of Dr. Shelah.  Pathology is pending.  We reviewed the results of his recent scans and recommendations today and he is comfortable and in agreement with the stated plan.   He knows that he is welcome to call at anytime in the interim with any questions or concerns related to his previous radiation treatments.  I personally spent 30 minutes in this encounter including chart review, reviewing radiological studies, telephone conversation with the patient, entering orders, coordinating care and completing documentation.   Sabra MICAEL Rusk, MMS, PA-C [place] [facility] Radiation Oncology Physician Assistant Direct Dial: [phone]  Fax: [phone]

## 2024-07-26 NOTE — Progress Notes (Signed)
              Patient was not available for this call.Juan Norris

## 2024-07-31 ENCOUNTER — Other Ambulatory Visit: Payer: Self-pay | Admitting: Radiation Therapy

## 2024-07-31 ENCOUNTER — Inpatient Hospital Stay: Attending: Radiation Oncology

## 2024-07-31 ENCOUNTER — Ambulatory Visit: Admitting: Acute Care

## 2024-07-31 ENCOUNTER — Encounter: Payer: Self-pay | Admitting: Acute Care

## 2024-07-31 ENCOUNTER — Telehealth: Payer: Self-pay | Admitting: Radiation Oncology

## 2024-07-31 DIAGNOSIS — C7931 Secondary malignant neoplasm of brain: Secondary | ICD-10-CM

## 2024-07-31 DIAGNOSIS — R911 Solitary pulmonary nodule: Secondary | ICD-10-CM

## 2024-07-31 DIAGNOSIS — R918 Other nonspecific abnormal finding of lung field: Secondary | ICD-10-CM

## 2024-07-31 DIAGNOSIS — Z87891 Personal history of nicotine dependence: Secondary | ICD-10-CM

## 2024-07-31 DIAGNOSIS — C3490 Malignant neoplasm of unspecified part of unspecified bronchus or lung: Secondary | ICD-10-CM

## 2024-07-31 NOTE — Telephone Encounter (Signed)
 Sent inbasket to scheduler M. Jakie advising of this referral.

## 2024-08-02 ENCOUNTER — Encounter: Payer: Self-pay | Admitting: Urology

## 2024-08-02 ENCOUNTER — Ambulatory Visit
Admission: RE | Admit: 2024-08-02 | Discharge: 2024-08-02 | Disposition: A | Discharge status: Home / Self Care | Source: Ambulatory Visit | Attending: Urology | Admitting: Urology

## 2024-08-02 DIAGNOSIS — C7931 Secondary malignant neoplasm of brain: Secondary | ICD-10-CM

## 2024-08-02 MED ORDER — DEXAMETHASONE 2 MG PO TABS: 2.0000 mg | ORAL_TABLET | Freq: Two times a day (BID) | ORAL | 0 refills | Status: DC

## 2024-08-02 NOTE — Progress Notes (Signed)
 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 Metastatic cancer to brain [facility])  C79.31         HISTORY OF PRESENT ILLNESS: Juan Norris is a 73 y.o. 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 Shannon from 11/03/21 through 11/14/21.  Follow-up CT chest imaging had subsequently remained stable without evidence of progression or recurrence through April 2023 when he last saw Dr. Shannon.  The plan at that time was for a follow-up scan in 6 months but he was lost to follow-up at the cancer center thereafter, apparently being followed with serial CT chest scans with Dr. Maranda, in medical oncology at the [facility].  Reportedly, his scans had remained without evidence of disease recurrence or progression as of his last scan in August 2024.  He presented to the emergency department on 11/12/2023 with complaints of diplopia and dizziness for 2 days.  A CT angio head and neck was performed on admission and showed masses in the left occipital lobe and right posterior fossa with significant edema and mass effect with effacement of the fourth ventricle.  Also noted was new nodularity and pleural thickening in the left upper lobe lung.  He was started on dexamethasone  4 mg p.o. 3 times daily and has had resolution of the diplopia and dizziness since starting the steroids.   A CT C/A/P was performed on 11/13/2023 and showed a stable, smaller appearance of the treated RLL nodule but a new 1.3 cm LUL nodule and a 5 mm RUL nodule.  Additionally, a 2 cm enhancing liver lesion was noted.  A CT head showed a 2.6 cm peripherally enhancing mass in the left occipital cortex and a 3.5 cm enhancing mass in the inferior right cerebellum.  MRI abdomen was performed on 11/15/2023 for further evaluation of the liver lesion and suggests this lesion is incidental, benign focal nodular hyperplasia rather than a metastasis.    An MRI brain scan on 11/15/2023 confirmed peripherally enhancing, partially cystic masses in the left occipital lobe and right cerebellum, consistent with metastatic disease. The right cerebellar mass was causing associated mass effect on the fourth ventricle, right pons, and right medulla. No hydrocephalus. Dr. Joshua, of neurosurgery, was consulted and recommended fractionated stereotactic radiosurgery as opposed to surgical resection. He subsequently completed 3 fractions of SRS to the brain under the care of Dr. Patrcia on 11/19/23, 11/22/23, and 11/24/23.   Subsequently, he underwent restaging PET scan on 11/29/23 showing an 11 mm subpleural nodule in the lateral LUL, suspicious for recurrence/metastasis vs primary bronchogenic carcinoma as well as a 4 mm subpleural nodule in RUL, beneath size threshold for PET sensitivity. The hepatic lesions were non-FDG-avid, favoring suspected benign etiology. He was referred back to pulmonology and proceeded with bronchoscopy with biopsies on 12/27/23 under the care of Dr.  Byrum. Cytology from both samples taken from the LUL lung nodule confirmed NSCLC, adenocarcinoma.  We saw him back in the clinic on 01/07/24 and he elected to proceed with the recommended 3-5 fraction course of stereotactic body radiotherapy (SBRT) to the new, left upper lobe lung nodule; completed 01/27/24.   He tolerated the treatments well and had remained without  complaints regarding the chest and brain radiation. He continues his routine follow up with Dr. Geneva in medical oncology at the [facility] for continued management of his systemic disease and had a restaging CT Chest on 05/30/24 and PET imaging on 06/23/24. The PET scan was performed 06/23/24 and showed a grossly unchanged appearance of the previously treated left upper lobe subpleural mass/consolidation with mild interval decreased metabolic activity. The remainder of the pulmonary nodules, including the right posteromedial paravertebral nodule, were stable as compared to prior but multifocal primary malignancy (adenocarcinoma spectrum) and metastasis were in the differential diagnosis so he was scheduled for bronchoscopy for tissue biopsy of the RLL nodule and 4L lymph node which was performed on 07/24/24, under the care of Dr. Shelah.  Dr. Geneva is currently considering starting on maintenance systemic therapy pending final results from pathology and molecular profile. Final pathology confirmed rare atypical cells favoring reactive/treatment effect in the FNA from the RLL lung nodule and no malignant cells in the FNA from the station 4L lymph node.  We discussed these findings today and I will forward a copy of the results and today's office visit note to his medical oncologist, Dr. Geneva.   The post-treatment MRI brain scan from 05/24/2024 showed an overall decrease in size of the 2 treated brain lesions in the right cerebellum and left occipital lobe and no new metastases identified but, there was some marginal interval enlargement of the treated right cerebellar lesion as compared to the previous scan in March 2025, felt to be most consistent with treatment effect so the recommendation was to repeat a short interval MRI brain scan in 2 months for close monitoring.  In the interim, he has been seen in the ED and admitted to the hospital on several occasions in July and August, with complaints of headaches, diplopia and  dizziness. He had a CT Head and MRI brain scan 07/12/24 for evaluation of new onset severe headache and these scans showed some increased edema with mass effect on the fourth ventricle as well as marginal interval enlargement (on the scale of a few mm) of the treated right cerebellar and left occipital lesions as compared to the previous scans from 02/2024 and 05/2024, likely consistent with treatment effect.  Fortunately, there were no new metastases identified. He was given IV dexamethasone  during hospital admission but was not discharged on any steroids.  His routine scheduled short interval MRI brain scan was performed on 07/18/24 and appeared stable as compared to the 07/12/24 MRI scan.  He presented back to the ED on 07/22/24 with complaints of dizziness and diplopia, again treated with IV dexamethasone  but not discharged home on any steroids.   I spoke with the patient to conduct his routine scheduled follow up visit to review results of his short-interval brain MRI scan via telephone to spare the patient unnecessary potential exposure in the healthcare setting  The patient was notified in advance and gave permission to proceed with this visit format.  We reviewed the results of his recent MRI brain scans by telephone today. He reports that he is doing fair in general and specifically denies any fevers,  chills, productive cough, hemoptysis, increased shortness of breath, chest pain, N/V or focal weakness.He has continued with imbalance and daily headaches since his last hospital admission. Fortunately, the headaches respond to Tylenol  prn. He denies any further diplopia and has not had any seizure activity or focal weakness.  PREVIOUS RADIATION THERAPY: Yes  01/20/24 - 01/27/24: The LUL lung nodule was treated to 54 Gy in 3 fractions of 18 Gy each.  11/19/23, 11/22/23, 11/24/23: Brain SRS / 27 Gy in 3 fractions  11/03/2021 through 11/14/2021 Site Technique Total Dose (Gy) Dose per Fx (Gy) Completed Fx Beam  Energies  Lung, Right: Lung_Rt IMRT 60/60 12 5/5 6XFFF     PAST MEDICAL HISTORY:  Past Medical History:  Diagnosis Date   Acute renal insufficiency    AICD (automatic cardioverter/defibrillator) present    Anginal pain (HCC)    CHF (congestive heart failure) (HCC)    Chronic lower back pain    due to 3 ruptured discs in my back (11/08/2018)   Diabetes mellitus without complication (HCC)    High cholesterol    High cholesterol    History of kidney stones 1982   passed them   History of radiation therapy    Right lung- 11/03/21-11/14/21- Dr. Lynwood Nay   Hypertension    Lung cancer [facility])    Myocardial infarction (HCC) 03/2018   Systolic heart failure (HCC)    Tobacco use       PAST SURGICAL HISTORY: Past Surgical History:  Procedure Laterality Date   BIV ICD INSERTION CRT-D N/A 11/08/2018    Medtronic biventricular ICD implantation by Dr Kelsie with His Bundle pacing and a MDT 3830 lead placed into the LV port.    BRONCHIAL BIOPSY  10/13/2021   Procedure: BRONCHIAL BIOPSIES;  Surgeon: Shelah Lamar RAMAN, MD;  Location: [facility] ENDOSCOPY;  Service: Pulmonary;;   BRONCHIAL BIOPSY  12/27/2023   Procedure: BRONCHIAL BIOPSIES;  Surgeon: Shelah Lamar RAMAN, MD;  Location: [facility] ENDOSCOPY;  Service: Pulmonary;;   BRONCHIAL BRUSHINGS  10/13/2021   Procedure: BRONCHIAL BRUSHINGS;  Surgeon: Shelah Lamar RAMAN, MD;  Location: [facility] ENDOSCOPY;  Service: Pulmonary;;   BRONCHIAL BRUSHINGS  12/27/2023   Procedure: BRONCHIAL BRUSHINGS;  Surgeon: Shelah Lamar RAMAN, MD;  Location: [facility] ENDOSCOPY;  Service: Pulmonary;;   BRONCHIAL NEEDLE ASPIRATION BIOPSY  10/13/2021   Procedure: BRONCHIAL NEEDLE ASPIRATION BIOPSIES;  Surgeon: Shelah Lamar RAMAN, MD;  Location: [facility] ENDOSCOPY;  Service: Pulmonary;;   BRONCHIAL NEEDLE ASPIRATION BIOPSY  12/27/2023   Procedure: BRONCHIAL NEEDLE ASPIRATION BIOPSIES;  Surgeon: Shelah Lamar RAMAN, MD;  Location: [facility] ENDOSCOPY;  Service: Pulmonary;;   BRONCHIAL WASHINGS  10/13/2021    Procedure: BRONCHIAL WASHINGS;  Surgeon: Shelah Lamar RAMAN, MD;  Location: [facility] ENDOSCOPY;  Service: Pulmonary;;   CARDIAC CATHETERIZATION     FIDUCIAL MARKER PLACEMENT  10/13/2021   Procedure: FIDUCIAL MARKER PLACEMENT;  Surgeon: Shelah Lamar RAMAN, MD;  Location: [facility] ENDOSCOPY;  Service: Pulmonary;;   FIDUCIAL MARKER PLACEMENT  12/27/2023   Procedure: FIDUCIAL MARKER PLACEMENT;  Surgeon: Shelah Lamar RAMAN, MD;  Location: [facility] ENDOSCOPY;  Service: Pulmonary;;   FINE NEEDLE ASPIRATION BIOPSY  07/24/2024   Procedure: FINE NEEDLE ASPIRATION BIOPSY;  Surgeon: Shelah Lamar RAMAN, MD;  Location: [facility] ENDOSCOPY;  Service: Pulmonary;;   INGUINAL HERNIA REPAIR Bilateral    IR IMAGING GUIDED PORT INSERTION  11/15/2023   KNEE SURGERY Left 1990s   growth on the back of my knee removed   LEFT HEART CATH AND CORONARY ANGIOGRAPHY N/A 03/30/2018   Procedure: LEFT  HEART CATH AND CORONARY ANGIOGRAPHY;  Surgeon: Verlin Lonni BIRCH, MD;  Location: [facility] INVASIVE CV LAB;  Service: Cardiovascular;  Laterality: N/A;   VIDEO BRONCHOSCOPY WITH ENDOBRONCHIAL NAVIGATION N/A 10/13/2021   Procedure: ROBOTIC VIDEO BRONCHOSCOPY WITH ENDOBRONCHIAL NAVIGATION;  Surgeon: Shelah Lamar RAMAN, MD;  Location: [facility] ENDOSCOPY;  Service: Pulmonary;  Laterality: N/A;   VIDEO BRONCHOSCOPY WITH ENDOBRONCHIAL NAVIGATION Bilateral 07/24/2024   Procedure: VIDEO BRONCHOSCOPY WITH ENDOBRONCHIAL NAVIGATION;  Surgeon: Shelah Lamar RAMAN, MD;  Location: [facility] ENDOSCOPY;  Service: Pulmonary;  Laterality: Bilateral;   VIDEO BRONCHOSCOPY WITH ENDOBRONCHIAL ULTRASOUND  07/24/2024   Procedure: BRONCHOSCOPY, WITH EBUS;  Surgeon: Shelah Lamar RAMAN, MD;  Location: [facility] ENDOSCOPY;  Service: Pulmonary;;   VIDEO BRONCHOSCOPY WITH RADIAL ENDOBRONCHIAL ULTRASOUND  10/13/2021   Procedure: RADIAL ENDOBRONCHIAL ULTRASOUND;  Surgeon: Shelah Lamar RAMAN, MD;  Location: [facility] ENDOSCOPY;  Service: Pulmonary;;    FAMILY HISTORY:  Family History  Problem Relation Age of Onset   Leukemia Mother     Cancer Father    Cancer Sister    Cancer Brother    CAD Brother     SOCIAL HISTORY:  Social History   Socioeconomic History   Marital status: Divorced    Spouse name: Not on file   Number of children: 1   Years of education: Not on file   Highest education level: Not on file  Occupational History   Not on file  Tobacco Use   Smoking status: Former    Current packs/day: 1.50    Average packs/day: 1.5 packs/day for 47.0 years (70.5 ttl pk-yrs)    Types: Cigarettes   Smokeless tobacco: Former   Tobacco comments:    Pt smokes 1 cig weekly when something bothers him. AB, CMA 07-10-24        1 cigarette smoked daily 05/28/22 ARJ   Vaping Use   Vaping status: Never Used  Substance and Sexual Activity   Alcohol use: Not Currently    Comment: 11/08/2018 stopped 09/23/1998   Drug use: Not Currently    Comment: 11/08/2018 nothing in the 2000s   Sexual activity: Yes  Other Topics Concern   Not on file  Social History Narrative   Retired   Chief Executive Officer Drivers of Corporate investment banker Strain: Not on file  Food Insecurity: No Food Insecurity (07/22/2024)   Hunger Vital Sign    Worried About Running Out of Food in the Last Year: Never true    Ran Out of Food in the Last Year: Never true  Transportation Needs: Unmet Transportation Needs (07/22/2024)   PRAPARE - Administrator, Civil Service (Medical): Yes    Lack of Transportation (Non-Medical): Yes  Physical Activity: Not on file  Stress: Not on file  Social Connections: Socially Integrated (07/22/2024)   Social Connection and Isolation Panel    Frequency of Communication with Friends and Family: Three times a week    Frequency of Social Gatherings with Friends and Family: Three times a week    Attends Religious Services: More than 4 times per year    Active Member of Clubs or Organizations: No    Attends Banker Meetings: 1 to 4 times per year    Marital Status: Married  Catering manager Violence:  Not At Risk (07/22/2024)   Humiliation, Afraid, Rape, and Kick questionnaire    Fear of Current or Ex-Partner: No    Emotionally Abused: No    Physically Abused: No    Sexually Abused: No    ALLERGIES:  Patient has no known allergies.  MEDICATIONS:  Current Outpatient Medications  Medication Sig Dispense Refill   aspirin  81 MG EC tablet Take 1 tablet (81 mg total) by mouth daily. 30 tablet 6   atorvastatin  (LIPITOR ) 40 MG tablet Take 1 tablet (40 mg total) by mouth at bedtime.     carvedilol  (COREG ) 25 MG tablet Take 25 mg by mouth daily.     dexamethasone  (DECADRON ) 2 MG tablet Take 1 tablet (2 mg total) by mouth 2 (two) times daily with a meal. 30 tablet 0   Empagliflozin-metFORMIN  HCl 12.5-500 MG TABS Take 1 tablet by mouth daily.     ferrous sulfate 325 (65 FE) MG tablet Take 325 mg by mouth daily with breakfast.     furosemide  (LASIX ) 20 MG tablet Take 1 tablet (20 mg total) by mouth daily. As directed may take 1 additional tablet by mouth daily only as needed for weight gain of 2 lbs in 24 hours or 5 lbs in a week 120 tablet 3   Multiple Vitamin (MULTIVITAMIN WITH MINERALS) TABS tablet Take 1 tablet by mouth daily.     sacubitril -valsartan  (ENTRESTO ) 24-26 MG Take 1 tablet by mouth 2 (two) times daily.     No current facility-administered medications for this encounter.    REVIEW OF SYSTEMS:  On review of systems, the patient reports that he is doing well overall but is bothered with the persistent imbalance and intermittent daily headaches.  He has not had any further diplopia, and denies focal weakness or seizure activity.  He denies any chest pain, shortness of breath, cough, fevers, chills, night sweats, or recent unintended weight changes. He denies any bowel or bladder disturbances, and denies abdominal pain, nausea or vomiting. He denies any new musculoskeletal or joint aches or pains. A complete review of systems is obtained and is otherwise negative.    PHYSICAL EXAM:  Wt  Readings from Last 3 Encounters:  07/24/24 151 lb (68.5 kg)  07/23/24 147 lb 4.8 oz (66.8 kg)  07/11/24 150 lb 9.6 oz (68.3 kg)   Temp Readings from Last 3 Encounters:  07/24/24 97.8 F (36.6 C)  07/23/24 (!) 97.5 F (36.4 C) (Oral)  07/12/24 (!) 97.5 F (36.4 C) (Oral)   BP Readings from Last 3 Encounters:  07/24/24 (!) 115/42  07/23/24 (!) 132/49  07/12/24 128/70   Pulse Readings from Last 3 Encounters:  07/24/24 60  07/23/24 64  07/12/24 72   Pain Assessment Pain Score: 0-No pain/10  Unable to assess due to telephone follow up visit format.     KPS = 100  100 - Normal; no complaints; no evidence of disease. 90   - Able to carry on normal activity; minor signs or symptoms of disease. 80   - Normal activity with effort; some signs or symptoms of disease. 56   - Cares for self; unable to carry on normal activity or to do active work. 60   - Requires occasional assistance, but is able to care for most of his personal needs. 50   - Requires considerable assistance and frequent medical care. 40   - Disabled; requires special care and assistance. 30   - Severely disabled; hospital admission is indicated although death not imminent. 20   - Very sick; hospital admission necessary; active supportive treatment necessary. 10   - Moribund; fatal processes progressing rapidly. 0     - Dead  Karnofsky DA, Abelmann WH, Craver LS and [facility] [phone]) The use of the  nitrogen mustards in the palliative treatment of carcinoma: with particular reference to bronchogenic carcinoma Cancer 1 634-56  LABORATORY DATA:  Lab Results  Component Value Date   WBC 9.2 07/22/2024   HGB 14.1 07/22/2024   HCT 43.9 07/22/2024   MCV 90.3 07/22/2024   PLT 205 07/22/2024   Lab Results  Component Value Date   NA 137 07/23/2024   K 4.0 07/23/2024   CL 108 07/23/2024   CO2 19 (L) 07/23/2024   Lab Results  Component Value Date   ALT 19 07/22/2024   AST 24 07/22/2024   ALKPHOS 76 07/22/2024    BILITOT 1.0 07/22/2024     RADIOGRAPHY: DG Chest Port 1 View Result Date: 07/24/2024 CLINICAL DATA:  Post bronchoscopy with biopsy. EXAM: PORTABLE CHEST 1 VIEW COMPARISON:  12/27/2023 FINDINGS: Left AICD and right Port-A-Cath remain in place, unchanged. Hyperinflation/emphysema. No pneumothorax. Patchy opacity in the right mid lung and left lower lobe. No visible effusions. IMPRESSION: No visible pneumothorax following bronchoscopy. Patchy right mid lung and left lower lobe airspace opacities which could reflect infiltrates or related to bronchoscopy. Electronically Signed   By: Franky Crease M.D.   On: 07/24/2024 14:15   DG C-ARM BRONCHOSCOPY Result Date: 07/24/2024 C-ARM BRONCHOSCOPY: Fluoroscopy was utilized by the requesting physician.  No radiographic interpretation.   CT ANGIO HEAD W OR WO CONTRAST Result Date: 07/23/2024 EXAM: CTA Head Without and with Intravenous Contrast CLINICAL HISTORY: Headache, sudden, severe. Scan due to sudden severe headache. Pt c/o diplopia. Known brain metastases. TECHNIQUE: Axial CTA images of the head without and with intravenous contrast. MIP reconstructed images were created and reviewed. Dose reduction technique was used including one or more of the following: automated exposure control, adjustment of mA and kV according to patient size, and/or iterative reconstruction. CONTRAST: Without and with; 75 mL of iohexol  (OMNIPAQUE ) 350 MG/ML injection. COMPARISON: None provided. FINDINGS: INTERNAL CAROTID ARTERIES: The intracranial ICAs are patent with no significant stenosis. No occlusion. No aneurysm. Atherosclerotic calcifications are present within the cavernous internal carotid arteries bilaterally without significant stenosis relative to the ICA turbinate. ANTERIOR CEREBRAL ARTERIES: No significant stenosis. No occlusion. No aneurysm. MIDDLE CEREBRAL ARTERIES: No significant stenosis. No occlusion. No aneurysm. POSTERIOR CEREBRAL ARTERIES: No significant stenosis.  No occlusion. No aneurysm. BASILAR ARTERY: No significant stenosis. No occlusion. No aneurysm. VERTEBRAL ARTERIES: No significant stenosis. No occlusion. No aneurysm. SOFT TISSUES: No acute finding. No masses or lymphadenopathy. BONES: No acute osseous abnormality. BRAIN: Areas of hypoattenuation and vasogenic edema in the left occipital lobe and inferior right cerebellum are stable, consistent with known brain metastases. No new lesions are evident. IMPRESSION: 1. No evidence of significant stenosis, aneurysmal dilatation, or dissection involving the arteries of the head. 2. Stable areas of hypoattenuation and vasogenic edema in the left occipital lobe and inferior right cerebellum, consistent with known brain metastases. No new lesions are evident. Electronically signed by: Lonni Necessary MD 07/23/2024 11:02 AM EDT RP Workstation: [id number]   CT Head Wo Contrast Result Date: 07/22/2024 CLINICAL DATA:  Brain metastases, assess treatment response. Neuro deficit, acute, stroke suspected. Double vision EXAM: CT HEAD WITHOUT CONTRAST TECHNIQUE: Contiguous axial images were obtained from the base of the skull through the vertex without intravenous contrast. RADIATION DOSE REDUCTION: This exam was performed according to the departmental dose-optimization program which includes automated exposure control, adjustment of the mA and/or kV according to patient size and/or use of iterative reconstruction technique. COMPARISON:  07/11/2024, MRI 07/18/2024 FINDINGS: Brain: Mild parenchymal volume loss is commensurate  with the patient's age. Mild periventricular white matter changes are present likely reflecting the sequela of small vessel ischemia. Vasogenic edema is again seen within the left occipital lobe and right cerebellar hemisphere related to known cerebral metastases in these locations. The associated mass effect with partial effacement of the fourth ventricle and mild right-to-left deviation of the cerebellar  vermis, and minimal effacement of the left lateral ventricle occipital horn is stable since prior examination. Known cerebral metastases themselves are not well visualized on this noncontrast examination. No midline shift. No acute intracranial hemorrhage or infarct. Ventricular size is normal. No intra or extra-axial fluid collection identified. Vascular: No hyperdense vessel or unexpected calcification. Skull: Normal. Negative for fracture or focal lesion. Sinuses/Orbits: No acute finding. Other: Mastoid air cells and middle ear cavities are clear. IMPRESSION: 1. No acute intracranial hemorrhage or infarct. 2. Stable vasogenic edema within the left occipital lobe and right cerebellar hemisphere related to known cerebral metastases in these locations. Stable associated mass effect with partial effacement of the right ventricle and minimal effacement of the left lateral ventricle occipital horn. Known cerebral metastases themselves are not well visualized on this noncontrast examination. Electronically Signed   By: Dorethia Molt M.D.   On: 07/22/2024 20:11   CT Super D Chest Wo Contrast Result Date: 07/21/2024 CLINICAL DATA:  Malignant neoplasm of right lower lobe with brain metastasis EXAM: CT CHEST WITHOUT CONTRAST TECHNIQUE: Multidetector CT imaging of the chest was performed using thin slice collimation for electromagnetic bronchoscopy planning purposes, without intravenous contrast. RADIATION DOSE REDUCTION: This exam was performed according to the departmental dose-optimization program which includes automated exposure control, adjustment of the mA and/or kV according to patient size and/or use of iterative reconstruction technique. COMPARISON:  PET-CT June 23, 2024 FINDINGS: Cardiovascular: The heart size is normal. Atherosclerotic calcifications of coronary arteries. To a lead pacer in place with the leads in right atrium and ventricle. Right-sided porta catheter tip terminates in distal SVC. No  pericardial fluid. Mediastinum/Nodes: Multiple subcentimeter and pericentimeter mediastinal lymph nodes. Lungs/pleura: Upper lobe predominant centrilobular emphysematous changes. Few bulla measuring up to 5 cm in left apex and right lower lobe. Right lung base interlobular septal thickening. No significant honeycombing. There is a right lower lobe posteromedial lobulated nodule measuring 1.5 x 1.2 cm, stable to prior. A fiducial marker is identified adjacent to this lesion. Left upper lobe subpleural spiculated nodule measuring 2.5 x 1.3 cm, stable to prior. Fiducial marker identified adjacent to this lesion. Right upper lobe 6 mm nodule, stable to prior (4/49). There is bronchial and bronchiolar wall thickening. Ground-glass attenuation and scarring along the right lower lobe site of known malignancy likely posttreatment changes. No new nodule.  No pleural effusion. Upper Abdomen: Punctate calcifications of the spleen suggestive of prior granulomatous disease. Musculoskeletal: Bilateral mild gynecomastia. IMPRESSION: Stable appearance of spiculated nodules along the subpleural left upper lobe and posteromedial right lower lobe with adjacent fiducial markers consistent with known multifocal synchronous bronchogenic malignancies. A right upper lobe 6 mm nodule is stable. No new nodules. Follow-up according to oncology protocols. 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 Scattered calcifications of the spleen indicating prior granulomatous disease (mycobacterial , sarcoidosis). Electronically Signed   By: Megan  Zare M.D.   On: 07/21/2024 19:10   MR Brain W Wo Contrast Result Date: 07/18/2024 EXAM: MRI BRAIN WITH AND WITHOUT CONTRAST 07/18/2024 10:24:00 AM TECHNIQUE: Multiplanar multisequence MRI of the head/brain was performed with and without the administration  of intravenous contrast. COMPARISON: MRI of the head dated 07/32/2025. CLINICAL HISTORY: Brain  metastases, assess treatment response; 3T SRS Protocol. Short interval scan to re-evaluate treatment effect. FINDINGS: BRAIN AND VENTRICLES: There are 2 metastatic lesions again demonstrated. A lesion present anteriorly within the right cerebellar hemisphere appears slightly more circumscribed and slightly smaller in overall size and has decreased in maximal dimensions in the interim from 3.3 x 2.5 x 3.0 cm to approximately 3.0 x 2.4 x 2.8 cm. The lesion within the left occipital lobe appears essentially unchanged measuring approximately 16 x 9 x 18 mm. The surrounding cerebral edema appears unchanged. There continues to be partial effacement of the fourth ventricle and mild displacement of the brain stem to the left. There are no additional lesions present. ORBITS: No acute abnormality. SINUSES: There is moderate mucosal disease within the floor of the left maxillary sinus and mild mucosal disease within the right maxillary sinus. BONES AND SOFT TISSUES: Normal bone marrow signal and enhancement. No acute soft tissue abnormality. IMPRESSION: 1. Slight decrease in size of the right cerebellar hemisphere metastatic lesion. 2. Stable left occipital lobe metastatic lesion. 3. No new lesions. 4. Unchanged surrounding cerebral edema. 5. Partial effacement of the fourth ventricle and mild displacement of the brain stem to the left. Electronically signed by: evalene coho 07/18/2024 03:12 PM EDT RP Workstation: [id number]   MR Brain W and Wo Contrast Result Date: 07/12/2024 CLINICAL DATA:  73 year old male with metastatic lung cancer. 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 Restaging. EXAM: MRI HEAD WITHOUT AND WITH CONTRAST TECHNIQUE: Multiplanar, multiecho pulse sequences of the brain and surrounding structures were obtained without and with intravenous contrast. CONTRAST:  6.5mL GADAVIST  GADOBUTROL  1 MMOL/ML IV SOLN COMPARISON:  Brain MRI 05/24/2024 and earlier.  FINDINGS: Brain: Confluent edema in the right cerebellar hemisphere, not improved since 05/24/2024, moderately progressed since 02/22/2024. Mass effect on the 4th ventricle also appears further progressed since June now on series 6, image 10. Underlying irregularly rim enhancing right cerebellar mass now measures up to 32 mm long axis on series 10, image 21, versus 22 mm in March. And this does appear mildly increased in size from last month also (about 26 mm at that time). Basilar cisterns remain patent. Left occipital lobe treatment site T2/FLAIR hyperintensity which is more stable since March. Residual irregular rim enhancing lesion there measuring 16 mm long axis is perhaps 1-2 mm larger since March and stable from last month. No new cerebral edema identified. No new enhancing lesion identified. No dural thickening or enhancement. No superimposed restricted diffusion to suggest acute infarction. No midline shift, ventriculomegaly, extra-axial collection or acute intracranial hemorrhage. Cervicomedullary junction and pituitary are within normal limits. Scattered nonspecific cerebral white matter T2 and FLAIR hyperintensity is stable, probably small vessel disease related including in the left superior cerebellum. Chronic microhemorrhage in the posterior left hemisphere series 7, image 63 is stable, along with mild hemosiderin at the site of both metastases. Vascular: Major intracranial vascular flow voids are preserved. Following contrast the major dural venous sinuses are enhancing and appear to be patent. Skull and upper cervical spine: Negative visible cervical spine and spinal cord. Visualized bone marrow signal is within normal limits. Sinuses/Orbits: Stable and negative. Other: Negative visible scalp and face. IMPRESSION: 1. Right cerebellar edema has significantly progressed since March, similar to last month although mass effect on the 4th ventricle has further increased. Underlying irregular rim  enhancing metastasis there has increased by 1  cm since March, a few mm larger since June. Basilar cisterns remain patent.  No ventriculomegaly. 2. Considerably more stable treated Left occipital metastasis since March, questionably 1-2 mm larger, with unchanged regional T2/FLAIR hyperintensity. 3. No new metastatic disease identified. Electronically Signed   By: VEAR Hurst M.D.   On: 07/12/2024 12:21   CT HEAD WO CONTRAST Result Date: 07/11/2024 CLINICAL DATA:  Headache, sudden, severe. Lung cancer metastatic to brain. EXAM: CT HEAD WITHOUT CONTRAST TECHNIQUE: Contiguous axial images were obtained from the base of the skull through the vertex without intravenous contrast. RADIATION DOSE REDUCTION: This exam was performed according to the departmental dose-optimization program which includes automated exposure control, adjustment of the mA and/or kV according to patient size and/or use of iterative reconstruction technique. COMPARISON:  MRI of the head dated May 24, 2024. FINDINGS: Brain: There is cerebral swelling in the left occipital lobe, corresponding with an area of metastatic disease noted on the previous study. There is also moderate cerebellar edema within the right cerebellar hemisphere, which appears to be more extensive than on the previous study. There is worsening effacement of the fourth ventricle. There is no obstructive hydrocephalus or tonsillar herniation. There is mild mass effect upon the pons and there is partial effacement of the prepontine cistern. Vascular: Moderate calcific atheromatous disease within the carotid siphons. Skull: Intact and unremarkable. Sinuses/Orbits: Mild mucosal disease within the floors of the maxillary sinuses bilaterally. Normal orbits. Other: None. IMPRESSION: 1. Interval worsening of swelling of the right cerebellar hemisphere secondary to metastatic disease. There is partial effacement of the fourth ventricle and the prepontine cistern. 2. Left occipital cerebral  swelling consistent with metastatic disease, which appears similar to the prior exam. Electronically Signed   By: Evalene Coho M.D.   On: 07/11/2024 12:43      IMPRESSION/PLAN: 1. 73 y.o. 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 His recent MRI brain scan from 07/18/2024 is stable as compared to CT head and MRI brain scans performed on 7/29 and 07/12/2024 respectively.  However, the MRI brain scan from 07/12/2024 did show some increased edema with mass effect on the fourth ventricle as well as marginal interval enlargement of the treated right cerebellar and left occipital lesions as compared to the previous scans, likely consistent with treatment effect.  Fortunately, there were no new metastases identified. Since he has been admitted on multiple occasions here recently with complaints of headache, diplopia and dizziness, we will get him in to see Dr. Buckley for further evaluation and management.  I will start him on low dose decadron  2 mg po BID to see if this improves the imbalance and headaches. He has a consult visit with Dr. Buckley 08/17/24 who will further manage steroid dosing as well as interval timing for his next MRI brain scan. We reviewed the results of his recent scans and recommendations today and he is comfortable and in agreement with the stated plan.  He knows that he is welcome to call at anytime in the interim with any questions or concerns related to his previous radiation treatments.  I personally spent 30 minutes in this encounter including chart review, reviewing radiological studies, telephone conversation with the patient, entering orders, coordinating care and completing documentation.   Sabra MICAEL Rusk, MMS, PA-C [facility] Radiation Oncology  Physician Assistant Direct Dial: [phone]  Fax: [phone]

## 2024-08-02 NOTE — Progress Notes (Signed)
 Juan Norris has telephone follow up visit to review recent brain MRI results. Identity verified.      They completed their radiation on: 01/27/24   Does the patient complain of any of the following: Headaches: Yes, reports dull headache on yesterday Diplopia:No Dizziness: No Hearing Changes: No Nausea: No Vomiting: No Balance or coordination issues: Yes, Patient states,  I walk around like I'm drunk. Memory issues: No Muscle weakness         Is the patient currently on a Decadron  regimen? : No steroids at this time.    Additional comments if applicable:   Patient aware of their telephone appointment w/ Ashlyn Bruning PA-C.    No vitals were taken during this visit.

## 2024-08-02 NOTE — Progress Notes (Signed)
 Radiation Oncology progress note from 08/02/2024 faxed to Alfonso Pap, [facility].

## 2024-08-09 ENCOUNTER — Ambulatory Visit (INDEPENDENT_AMBULATORY_CARE_PROVIDER_SITE_OTHER): Payer: Self-pay

## 2024-08-09 DIAGNOSIS — I428 Other cardiomyopathies: Secondary | ICD-10-CM

## 2024-08-10 ENCOUNTER — Ambulatory Visit: Payer: Self-pay | Admitting: Cardiology

## 2024-08-10 LAB — CUP PACEART REMOTE DEVICE CHECK
Battery Remaining Longevity: 10 mo
Battery Voltage: 2.85 V
Brady Statistic AP VP Percent: 21.31 %
Brady Statistic AP VS Percent: 0.01 %
Brady Statistic AS VP Percent: 78.65 %
Brady Statistic AS VS Percent: 0.04 %
Brady Statistic RA Percent Paced: 21.22 %
Brady Statistic RV Percent Paced: 99.69 %
Date Time Interrogation Session: 20250827012303
HighPow Impedance: 59 Ohm
Implantable Lead Connection Status: 753985
Implantable Lead Connection Status: 753985
Implantable Lead Connection Status: 753985
Implantable Lead Implant Date: 20191126
Implantable Lead Implant Date: 20191126
Implantable Lead Implant Date: 20191126
Implantable Lead Location: 753859
Implantable Lead Location: 753860
Implantable Lead Location: 753860
Implantable Lead Model: 3830
Implantable Lead Model: 5076
Implantable Pulse Generator Implant Date: 20191126
Lead Channel Impedance Value: 285 Ohm
Lead Channel Impedance Value: 285 Ohm
Lead Channel Impedance Value: 304 Ohm
Lead Channel Impedance Value: 361 Ohm
Lead Channel Impedance Value: 437 Ohm
Lead Channel Impedance Value: 494 Ohm
Lead Channel Pacing Threshold Amplitude: 0.375 V
Lead Channel Pacing Threshold Amplitude: 0.5 V
Lead Channel Pacing Threshold Amplitude: 0.625 V
Lead Channel Pacing Threshold Pulse Width: 0.4 ms
Lead Channel Pacing Threshold Pulse Width: 0.4 ms
Lead Channel Pacing Threshold Pulse Width: 0.4 ms
Lead Channel Sensing Intrinsic Amplitude: 1.625 mV
Lead Channel Sensing Intrinsic Amplitude: 1.625 mV
Lead Channel Sensing Intrinsic Amplitude: 9.75 mV
Lead Channel Sensing Intrinsic Amplitude: 9.75 mV
Lead Channel Setting Pacing Amplitude: 1.5 V
Lead Channel Setting Pacing Amplitude: 2.5 V
Lead Channel Setting Pacing Amplitude: 2.5 V
Lead Channel Setting Pacing Pulse Width: 0.4 ms
Lead Channel Setting Pacing Pulse Width: 0.4 ms
Lead Channel Setting Sensing Sensitivity: 0.3 mV
Zone Setting Status: 755011
Zone Setting Status: 755011

## 2024-08-14 NOTE — Addendum Note (Signed)
 Encounter addended by: Sherwood Rise, PA-C on: 08/14/2024 9:49 PM  Actions taken: Delete clinical note

## 2024-08-14 NOTE — Progress Notes (Unsigned)
  Electrophysiology Office Note:   Date:  08/15/2024  ID:  Juan Norris, DOB May 28, 1951, MRN [id number]  Primary Cardiologist: Lonni LITTIE Nanas, MD Primary Heart Failure: None Electrophysiologist: Tondra Reierson Gladis Norton, MD      History of Present Illness:   Juan Norris is a 73 y.o. male with h/o Chronic systolic heart failure, nonobstructive coronary artery disease, hypertension seen today for routine electrophysiology followup.   Since last being seen in our clinic the patient reports doing well.  He has no chest pain or shortness of breath.  He is able to do all of his daily activities without restriction.  He has no acute complaints at this time.  he denies chest pain, palpitations, dyspnea, PND, orthopnea, nausea, vomiting, dizziness, syncope, edema, weight gain, or early satiety.   Review of systems complete and found to be negative unless listed in HPI.      EP Information / Studies Reviewed:    EKG is ordered today. Personal review as below.  EKG Interpretation Date/Time:  Tuesday August 15 2024 08:27:33 EDT Ventricular Rate:  70 PR Interval:  138 QRS Duration:  168 QT Interval:  442 QTC Calculation: 477 R Axis:   265  Text Interpretation: Atrial-sensed ventricular-paced rhythm Biventricular pacemaker detected When compared with ECG of 12-Nov-2023 09:39, No significant change since last tracing Confirmed by Nocholas Damaso ([id number]) on 08/15/2024 8:45:30 AM   ICD Interrogation-  reviewed in detail today,  See PACEART report.  Device History: Medtronic BiV ICD implanted 2019 for chronic systolic heart failure History of appropriate therapy: No History of AAD therapy: No   Risk Assessment/Calculations:              Physical Exam:   VS:  BP (!) 110/58 (BP Location: Right Arm, Patient Position: Sitting, Cuff Size: Normal)   Pulse 60   Ht 5' 5 (1.651 m)   Wt 144 lb (65.3 kg)   SpO2 98%   BMI 23.96 kg/m    Wt Readings from Last 3 Encounters:  08/15/24 144 lb (65.3  kg)  07/24/24 151 lb (68.5 kg)  07/23/24 147 lb 4.8 oz (66.8 kg)     GEN: Well nourished, well developed in no acute distress NECK: No JVD; No carotid bruits CARDIAC: Regular rate and rhythm, no murmurs, rubs, gallops RESPIRATORY:  Clear to auscultation without rales, wheezing or rhonchi  ABDOMEN: Soft, non-tender, non-distended EXTREMITIES:  No edema; No deformity   ASSESSMENT AND PLAN:    Chronic systolic dysfunction s/p Medtronic CRT-D  euvolemic today Stable on an appropriate medical regimen Normal ICD function See Pace Art report No changes today  2.  Coronary artery disease: Nonobstructive.  Continue aspirin   3.  Hypertension: Well-controlled    Disposition:   Follow up with EP APP 9 months    Signed, Antero Derosia Gladis Norton, MD

## 2024-08-15 ENCOUNTER — Encounter: Payer: Self-pay | Admitting: Cardiology

## 2024-08-15 ENCOUNTER — Ambulatory Visit: Attending: Cardiology | Admitting: Cardiology

## 2024-08-15 VITALS — BP 110/58 | HR 60 | Ht 65.0 in | Wt 144.0 lb

## 2024-08-15 DIAGNOSIS — I10 Essential (primary) hypertension: Secondary | ICD-10-CM | POA: Diagnosis not present

## 2024-08-15 DIAGNOSIS — I428 Other cardiomyopathies: Secondary | ICD-10-CM | POA: Diagnosis not present

## 2024-08-15 DIAGNOSIS — I5042 Chronic combined systolic (congestive) and diastolic (congestive) heart failure: Secondary | ICD-10-CM

## 2024-08-15 LAB — CUP PACEART INCLINIC DEVICE CHECK
Date Time Interrogation Session: 20250902085338
Implantable Lead Connection Status: 753985
Implantable Lead Connection Status: 753985
Implantable Lead Connection Status: 753985
Implantable Lead Implant Date: 20191126
Implantable Lead Implant Date: 20191126
Implantable Lead Implant Date: 20191126
Implantable Lead Location: 753859
Implantable Lead Location: 753860
Implantable Lead Location: 753860
Implantable Lead Model: 3830
Implantable Lead Model: 5076
Implantable Pulse Generator Implant Date: 20191126

## 2024-08-15 NOTE — Patient Instructions (Signed)
 Medication Instructions:  Your physician recommends that you continue on your current medications as directed. Please refer to the Current Medication list given to you today.  *If you need a refill on your cardiac medications before your next appointment, please call your pharmacy*  Lab Work: None ordered   Testing/Procedures: None ordered  Follow-Up: At [facility], you and your health needs are our priority.  As part of our continuing mission to provide you with exceptional heart care, our providers are all part of one team.  This team includes your primary Cardiologist (physician) and Advanced Practice Providers or APPs (Physician Assistants and Nurse Practitioners) who all work together to provide you with the care you need, when you need it.  Your next appointment:   9 month(s)  Provider:   You will see one of the following Advanced Practice Providers on your designated Care Team:   Charlies Arthur, PA-C Michael Andy Tillery, PA-C Suzann Riddle, NP Daphne Barrack, NP      We recommend signing up for the patient portal called MyChart.  Sign up information is provided on this After Visit Summary.  MyChart is used to connect with patients for Virtual Visits (Telemedicine).  Patients are able to view lab/test results, encounter notes, upcoming appointments, etc.  Non-urgent messages can be sent to your provider as well.   To learn more about what you can do with MyChart, go to [email or web address].   Thank you for choosing [facility]!!   Maeola Domino, RN [phone]

## 2024-08-17 ENCOUNTER — Telehealth: Payer: Self-pay | Admitting: Internal Medicine

## 2024-08-17 ENCOUNTER — Telehealth: Payer: Self-pay | Admitting: *Deleted

## 2024-08-17 ENCOUNTER — Inpatient Hospital Stay: Admitting: Internal Medicine

## 2024-08-17 NOTE — Telephone Encounter (Signed)
 PC to patient regarding missed appointment this morning, he states he was unaware.  Informed patient our scheduling department will contact him to reschedule, he verbalizes understanding.  Scheduling message sent.

## 2024-08-17 NOTE — Telephone Encounter (Signed)
 Called to reschedule an appointment per staff message. Called and left a VM for the patient to call back to schedule an appointment.

## 2024-08-21 ENCOUNTER — Ambulatory Visit: Attending: Cardiovascular Disease

## 2024-08-21 DIAGNOSIS — Z95810 Presence of automatic (implantable) cardiac defibrillator: Secondary | ICD-10-CM | POA: Diagnosis not present

## 2024-08-21 DIAGNOSIS — I5042 Chronic combined systolic (congestive) and diastolic (congestive) heart failure: Secondary | ICD-10-CM | POA: Diagnosis not present

## 2024-08-24 ENCOUNTER — Inpatient Hospital Stay: Admitting: Internal Medicine

## 2024-08-24 ENCOUNTER — Inpatient Hospital Stay: Attending: Radiation Oncology | Admitting: Internal Medicine

## 2024-08-24 ENCOUNTER — Other Ambulatory Visit: Payer: Self-pay | Admitting: *Deleted

## 2024-08-24 VITALS — BP 117/42 | HR 69 | Temp 97.9°F | Resp 18 | Wt 144.9 lb

## 2024-08-24 DIAGNOSIS — Z7952 Long term (current) use of systemic steroids: Secondary | ICD-10-CM | POA: Insufficient documentation

## 2024-08-24 DIAGNOSIS — Z87442 Personal history of urinary calculi: Secondary | ICD-10-CM | POA: Insufficient documentation

## 2024-08-24 DIAGNOSIS — Z9221 Personal history of antineoplastic chemotherapy: Secondary | ICD-10-CM | POA: Insufficient documentation

## 2024-08-24 DIAGNOSIS — Z87891 Personal history of nicotine dependence: Secondary | ICD-10-CM | POA: Insufficient documentation

## 2024-08-24 DIAGNOSIS — C7931 Secondary malignant neoplasm of brain: Secondary | ICD-10-CM | POA: Insufficient documentation

## 2024-08-24 DIAGNOSIS — Z7982 Long term (current) use of aspirin: Secondary | ICD-10-CM | POA: Insufficient documentation

## 2024-08-24 DIAGNOSIS — C3412 Malignant neoplasm of upper lobe, left bronchus or lung: Secondary | ICD-10-CM | POA: Insufficient documentation

## 2024-08-24 DIAGNOSIS — I110 Hypertensive heart disease with heart failure: Secondary | ICD-10-CM | POA: Diagnosis not present

## 2024-08-24 DIAGNOSIS — G8929 Other chronic pain: Secondary | ICD-10-CM | POA: Insufficient documentation

## 2024-08-24 DIAGNOSIS — E7800 Pure hypercholesterolemia, unspecified: Secondary | ICD-10-CM | POA: Diagnosis not present

## 2024-08-24 DIAGNOSIS — I5022 Chronic systolic (congestive) heart failure: Secondary | ICD-10-CM | POA: Diagnosis not present

## 2024-08-24 DIAGNOSIS — Z923 Personal history of irradiation: Secondary | ICD-10-CM | POA: Diagnosis not present

## 2024-08-24 DIAGNOSIS — I252 Old myocardial infarction: Secondary | ICD-10-CM | POA: Insufficient documentation

## 2024-08-24 DIAGNOSIS — Z79899 Other long term (current) drug therapy: Secondary | ICD-10-CM | POA: Insufficient documentation

## 2024-08-24 DIAGNOSIS — Z7984 Long term (current) use of oral hypoglycemic drugs: Secondary | ICD-10-CM | POA: Diagnosis not present

## 2024-08-24 DIAGNOSIS — E119 Type 2 diabetes mellitus without complications: Secondary | ICD-10-CM | POA: Insufficient documentation

## 2024-08-24 DIAGNOSIS — Z806 Family history of leukemia: Secondary | ICD-10-CM | POA: Diagnosis not present

## 2024-08-24 MED ORDER — DEXAMETHASONE 4 MG PO TABS: 4.0000 mg | ORAL_TABLET | Freq: Every day | ORAL | 0 refills | Status: DC

## 2024-08-24 NOTE — Progress Notes (Signed)
 [facility] [address]  [place], [place] [address] [phone]   New Patient Evaluation  Date of Service: 08/24/24 Patient Name: Juan Norris Patient MRN: [id number] Patient DOB: 09/27/1951 Provider: Arthea MARLA Manns, MD  Identifying Statement:  Juan Norris is a 73 y.o. male with Metastatic cancer to brain [facility]) who presents for initial consultation and evaluation regarding cancer associated neurologic deficits.    Referring Provider: Clinic, [place] [address] 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 (HCC)   12/03/2023 Cancer Staging   Staging form: Lung, AJCC 8th Edition - Clinical: Stage IVB (cTX, cNX, cM1c) - Signed by Autumn Millman, MD on 12/03/2023   12/16/2023 - 12/16/2023 Chemotherapy   Patient is on Treatment Plan : LUNG NSCLC Carboplatin (6) + Paclitaxel (200) + Pembrolizumab (200) D1 q21d x 4 cycles / Pembrolizumab (200) Maintenance D1 q21d     Metastatic cancer to brain (HCC)  11/13/2023 Initial Diagnosis   Metastatic cancer to brain (HCC)   12/16/2023 - 12/16/2023 Chemotherapy   Patient is on Treatment Plan : LUNG NSCLC Carboplatin (6) + Paclitaxel (200) + Pembrolizumab (200) D1 q21d x 4 cycles / Pembrolizumab (200) Maintenance D1 q21d     Primary cancer of left upper lobe of lung (HCC)  01/07/2024 Initial Diagnosis   Primary cancer of left upper lobe of lung (HCC)   01/07/2024 Cancer Staging   Staging form: Lung, AJCC V9 - Clinical stage from 01/07/2024: Stage IA2 (cT1b, cN0, cM0) - Signed by Patrcia Cough, MD on 01/07/2024 Histopathologic type: Adenocarcinoma, NOS Stage prefix: Initial diagnosis     CNS Oncologic History 11/24/23: Completes 3 fraction SRS to 2 tagets, including large R cerebellar  hemisphere  History of Present Illness: [name] records from the referring physician were obtained and reviewed and the patient interviewed to confirm this HPI.  Juan Norris presents today to establish care given recent neurologic symptoms.  He describes recurrence of wobbly gait and double vision.  Timing is following running out of medication that had been prescribed by inpatient team.  While on the steroids, he was able to walk without much trouble and rarely had double vision.  [facility]    Medications: Current Outpatient Medications on File Prior to Visit  Medication Sig Dispense Refill   aspirin  81 MG EC tablet Take 1 tablet (81 mg total) by mouth daily. 30 tablet 6   atorvastatin  (LIPITOR ) 40 MG tablet Take 1 tablet (40 mg total) by mouth at bedtime.     carvedilol  (COREG ) 25 MG tablet Take 25 mg by mouth daily.     dexamethasone  (DECADRON ) 2 MG tablet Take 1 tablet (2 mg total) by mouth 2 (two) times daily with a meal. 30 tablet 0   Empagliflozin-metFORMIN  HCl 12.5-500 MG TABS Take 1 tablet by mouth daily.     ferrous sulfate 325 (65 FE) MG tablet Take 325 mg by mouth daily with breakfast.     furosemide  (LASIX ) 20 MG tablet Take 1 tablet (20 mg total) by mouth daily. As directed may take 1 additional tablet by mouth daily only as needed for weight gain of 2 lbs in 24 hours or 5 lbs in a week 120 tablet 3   Multiple Vitamin (MULTIVITAMIN WITH MINERALS)  TABS tablet Take 1 tablet by mouth daily.     nicotine  (NICODERM CQ  - DOSED IN MG/24 HOURS) 21 mg/24hr patch Place 21 mg onto the skin daily.     sacubitril -valsartan  (ENTRESTO ) 24-26 MG Take 1 tablet by mouth 2 (two) times daily.     sildenafil (VIAGRA) 100 MG tablet Take 100 mg by mouth as needed for erectile dysfunction.     No current facility-administered medications on file prior to visit.    Allergies: No Known Allergies Past Medical History:  Past Medical History:  Diagnosis Date    Acute renal insufficiency    AICD (automatic cardioverter/defibrillator) present    Anginal pain (HCC)    CHF (congestive heart failure) (HCC)    Chronic lower back pain    due to 3 ruptured discs in my back (11/08/2018)   Diabetes mellitus without complication (HCC)    High cholesterol    High cholesterol    History of kidney stones 1982   passed them   History of radiation therapy    Right lung- 11/03/21-11/14/21- Dr. Lynwood Nay   Hypertension    Lung cancer [facility])    Myocardial infarction (HCC) 03/2018   Systolic heart failure (HCC)    Tobacco use    Past Surgical History:  Past Surgical History:  Procedure Laterality Date   BIV ICD INSERTION CRT-D N/A 11/08/2018    Medtronic biventricular ICD implantation by Dr Kelsie with His Bundle pacing and a MDT 3830 lead placed into the LV port.    BRONCHIAL BIOPSY  10/13/2021   Procedure: BRONCHIAL BIOPSIES;  Surgeon: Shelah Lamar RAMAN, MD;  Location: [facility] ENDOSCOPY;  Service: Pulmonary;;   BRONCHIAL BIOPSY  12/27/2023   Procedure: BRONCHIAL BIOPSIES;  Surgeon: Shelah Lamar RAMAN, MD;  Location: [facility] ENDOSCOPY;  Service: Pulmonary;;   BRONCHIAL BRUSHINGS  10/13/2021   Procedure: BRONCHIAL BRUSHINGS;  Surgeon: Shelah Lamar RAMAN, MD;  Location: [facility] ENDOSCOPY;  Service: Pulmonary;;   BRONCHIAL BRUSHINGS  12/27/2023   Procedure: BRONCHIAL BRUSHINGS;  Surgeon: Shelah Lamar RAMAN, MD;  Location[facility] ENDOSCOPY;  Service: Pulmonary;;   BRONCHIAL NEEDLE ASPIRATION BIOPSY  10/13/2021   Procedure: BRONCHIAL NEEDLE ASPIRATION BIOPSIES;  Surgeon: Shelah Lamar RAMAN, MD;  Location: [facility] ENDOSCOPY;  Service: Pulmonary;;   BRONCHIAL NEEDLE ASPIRATION BIOPSY  12/27/2023   Procedure: BRONCHIAL NEEDLE ASPIRATION BIOPSIES;  Surgeon: Shelah Lamar RAMAN, MD;  Location: [facility] ENDOSCOPY;  Service: Pulmonary;;   BRONCHIAL WASHINGS  10/13/2021   Procedure: BRONCHIAL WASHINGS;  Surgeon: Shelah Lamar RAMAN, MD;  Location: [facility] ENDOSCOPY;  Service: Pulmonary;;   CARDIAC CATHETERIZATION      FIDUCIAL MARKER PLACEMENT  10/13/2021   Procedure: FIDUCIAL MARKER PLACEMENT;  Surgeon: Shelah Lamar RAMAN, MD;  Location: [facility] ENDOSCOPY;  Service: Pulmonary;;   FIDUCIAL MARKER PLACEMENT  12/27/2023   Procedure: FIDUCIAL MARKER PLACEMENT;  Surgeon: Shelah Lamar RAMAN, MD;  Location: [facility] ENDOSCOPY;  Service: Pulmonary;;   FINE NEEDLE ASPIRATION BIOPSY  07/24/2024   Procedure: FINE NEEDLE ASPIRATION BIOPSY;  Surgeon: Shelah Lamar RAMAN, MD;  Location: [facility] ENDOSCOPY;  Service: Pulmonary;;   INGUINAL HERNIA REPAIR Bilateral    IR IMAGING GUIDED PORT INSERTION  11/15/2023   KNEE SURGERY Left 1990s   growth on the back of my knee removed   LEFT HEART CATH AND CORONARY ANGIOGRAPHY N/A 03/30/2018   Procedure: LEFT HEART CATH AND CORONARY ANGIOGRAPHY;  Surgeon: Verlin Lonni BIRCH, MD;  Location: [facility] INVASIVE CV LAB;  Service: Cardiovascular;  Laterality: N/A;   VIDEO BRONCHOSCOPY WITH ENDOBRONCHIAL NAVIGATION N/A  10/13/2021   Procedure: ROBOTIC VIDEO BRONCHOSCOPY WITH ENDOBRONCHIAL NAVIGATION;  Surgeon: Shelah Lamar RAMAN, MD;  Location: [facility] ENDOSCOPY;  Service: Pulmonary;  Laterality: N/A;   VIDEO BRONCHOSCOPY WITH ENDOBRONCHIAL NAVIGATION Bilateral 07/24/2024   Procedure: VIDEO BRONCHOSCOPY WITH ENDOBRONCHIAL NAVIGATION;  Surgeon: Shelah Lamar RAMAN, MD;  Location: [facility] ENDOSCOPY;  Service: Pulmonary;  Laterality: Bilateral;   VIDEO BRONCHOSCOPY WITH ENDOBRONCHIAL ULTRASOUND  07/24/2024   Procedure: BRONCHOSCOPY, WITH EBUS;  Surgeon: Shelah Lamar RAMAN, MD;  Location: [facility] ENDOSCOPY;  Service: Pulmonary;;   VIDEO BRONCHOSCOPY WITH RADIAL ENDOBRONCHIAL ULTRASOUND  10/13/2021   Procedure: RADIAL ENDOBRONCHIAL ULTRASOUND;  Surgeon: Shelah Lamar RAMAN, MD;  Location: [facility] ENDOSCOPY;  Service: Pulmonary;;   Social History:  Social History   Socioeconomic History   Marital status: Divorced    Spouse name: Not on file   Number of children: 1   Years of education: Not on file   Highest education level: Not on file   Occupational History   Not on file  Tobacco Use   Smoking status: Former    Current packs/day: 1.50    Average packs/day: 1.5 packs/day for 47.0 years (70.5 ttl pk-yrs)    Types: Cigarettes   Smokeless tobacco: Former   Tobacco comments:    Pt smokes 1 cig weekly when something bothers him. AB, CMA 07-10-24        1 cigarette smoked daily 05/28/22 ARJ   Vaping Use   Vaping status: Never Used  Substance and Sexual Activity   Alcohol use: Not Currently    Comment: 11/08/2018 stopped 09/23/1998   Drug use: Not Currently    Comment: 11/08/2018 nothing in the 2000s   Sexual activity: Yes  Other Topics Concern   Not on file  Social History Narrative   Retired   Chief Executive Officer Drivers of Corporate investment banker Strain: Not on file  Food Insecurity: No Food Insecurity (07/22/2024)   Hunger Vital Sign    Worried About Running Out of Food in the Last Year: Never true    Ran Out of Food in the Last Year: Never true  Transportation Needs: Unmet Transportation Needs (07/22/2024)   PRAPARE - Administrator, Civil Service (Medical): Yes    Lack of Transportation (Non-Medical): Yes  Physical Activity: Not on file  Stress: Not on file  Social Connections: Socially Integrated (07/22/2024)   Social Connection and Isolation Panel    Frequency of Communication with Friends and Family: Three times a week    Frequency of Social Gatherings with Friends and Family: Three times a week    Attends Religious Services: More than 4 times per year    Active Member of Clubs or Organizations: No    Attends Banker Meetings: 1 to 4 times per year    Marital Status: Married  Catering manager Violence: Not At Risk (07/22/2024)   Humiliation, Afraid, Rape, and Kick questionnaire    Fear of Current or Ex-Partner: No    Emotionally Abused: No    Physically Abused: No    Sexually Abused: No   Family History:  Family History  Problem Relation Age of Onset   Leukemia Mother    Cancer  Father    Cancer Sister    Cancer Brother    CAD Brother     Review of Systems: Constitutional: Doesn't report fevers, chills or abnormal weight loss Eyes: Doesn't report blurriness of vision Ears, nose, mouth, throat, and face: Doesn't report sore throat Respiratory: Doesn't report cough, dyspnea or  wheezes Cardiovascular: Doesn't report palpitation, chest discomfort  Gastrointestinal:  Doesn't report nausea, constipation, diarrhea GU: Doesn't report incontinence Skin: Doesn't report skin rashes Neurological: Per HPI Musculoskeletal: Doesn't report joint pain Behavioral/Psych: Doesn't report anxiety  Physical Exam: Vitals:   08/24/24 1340  BP: (!) 117/42  Pulse: 69  Resp: 18  Temp: 97.9 F (36.6 C)  SpO2: 99%   KPS: 80. General: Alert, cooperative, pleasant, in no acute distress Head: Normal EENT: No conjunctival injection or scleral icterus.  Lungs: Resp effort normal Cardiac: Regular rate Abdomen: Non-distended abdomen Skin: No rashes cyanosis or petechiae. Extremities: No clubbing or edema  Neurologic Exam: Mental Status: Awake, alert, attentive to examiner. Oriented to self and environment. Language is fluent with intact comprehension.  Cranial Nerves: Visual acuity is grossly normal. Visual fields are full. Extra-ocular movements intact. No ptosis. Face is symmetric Motor: Tone and bulk are normal. Power is full in both arms and legs. Reflexes are symmetric, no pathologic reflexes present.  Sensory: Intact to light touch Gait: Normal.   Labs: I have reviewed the data as listed    Component Value Date/Time   NA 137 07/23/2024 0443   NA 145 (H) 02/03/2024 1335   K 4.0 07/23/2024 0443   CL 108 07/23/2024 0443   CO2 19 (L) 07/23/2024 0443   GLUCOSE 167 (H) 07/23/2024 0443   BUN 24 (H) 07/23/2024 0443   BUN 12 02/03/2024 1335   CREATININE 0.99 07/23/2024 0443   CALCIUM  9.0 07/23/2024 0443   PROT 6.6 07/22/2024 2007   PROT 7.1 02/12/2021 0802   ALBUMIN  4.1 07/22/2024 2007   ALBUMIN 4.1 02/12/2021 0802   AST 24 07/22/2024 2007   ALT 19 07/22/2024 2007   ALKPHOS 76 07/22/2024 2007   BILITOT 1.0 07/22/2024 2007   BILITOT 0.5 02/12/2021 0802   GFRNONAA >60 07/23/2024 0443   GFRAA 91 09/04/2019 1028   Lab Results  Component Value Date   WBC 9.2 07/22/2024   NEUTROABS 6.6 07/22/2024   HGB 14.1 07/22/2024   HCT 43.9 07/22/2024   MCV 90.3 07/22/2024   PLT 205 07/22/2024    Imaging:  EXAM: MRI BRAIN WITH AND WITHOUT CONTRAST 07/18/2024 10:24:00 AM   TECHNIQUE: Multiplanar multisequence MRI of the head/brain was performed with and without the administration of intravenous contrast.   COMPARISON: MRI of the head dated 07/32/2025.   CLINICAL HISTORY: Brain metastases, assess treatment response; 3T SRS Protocol. Short interval scan to re-evaluate treatment effect.   FINDINGS:   BRAIN AND VENTRICLES: There are 2 metastatic lesions again demonstrated. A lesion present anteriorly within the right cerebellar hemisphere appears slightly more circumscribed and slightly smaller in overall size and has decreased in maximal dimensions in the interim from 3.3 x 2.5 x 3.0 cm to approximately 3.0 x 2.4 x 2.8 cm. The lesion within the left occipital lobe appears essentially unchanged measuring approximately 16 x 9 x 18 mm. The surrounding cerebral edema appears unchanged. There continues to be partial effacement of the fourth ventricle and mild displacement of the brain stem to the left. There are no additional lesions present.   ORBITS: No acute abnormality.   SINUSES: There is moderate mucosal disease within the floor of the left maxillary sinus and mild mucosal disease within the right maxillary sinus.   BONES AND SOFT TISSUES: Normal bone marrow signal and enhancement. No acute soft tissue abnormality.   IMPRESSION: 1. Slight decrease in size of the right cerebellar hemisphere metastatic lesion. 2. Stable left occipital lobe  metastatic lesion. 3. No new lesions. 4. Unchanged surrounding cerebral edema. 5. Partial effacement of the fourth ventricle and mild displacement of the brain stem to the left.   Electronically signed by: evalene coho 07/18/2024  [facility] Clinician Interpretation: I have personally reviewed the radiological images as listed.  My interpretation, in the context of the patient's clinical presentation, is stable disease   Assessment/Plan Metastatic cancer to brain [facility])  Juan Norris presents with clinical syndrome localizing to the posterior fossa.  Symptoms are secondary to tumor and inflammatory burden following treatment for metastatic NSCLC in December.  Recommended resuming decadron  4mg  daily at this time.  We will call him in 2 weeks to assess response and potentially schedule a very slow taper.  Otherwise will continue imaging surveillance only.   We spent twenty additional minutes teaching regarding the natural history, biology, and historical experience in the treatment of neurologic complications of cancer.   We appreciate the opportunity to participate in the care of [employer].   We ask that Juan Norris return to clinic in 3 months following next brain MRI, or sooner as needed.  Will also call him for med check in 2 weeks.  All questions were answered. The patient knows to call the clinic with any problems, questions or concerns. No barriers to learning were detected.  The total time spent in the encounter was 45 minutes and more than 50% was on counseling and review of test results   Arthea MARLA Manns, MD Medical Director of Neuro-Oncology [facility] 08/24/24 1:31 PM

## 2024-08-25 ENCOUNTER — Other Ambulatory Visit: Payer: Self-pay | Admitting: Radiation Therapy

## 2024-08-25 NOTE — Progress Notes (Signed)
 EPIC Encounter for ICM Monitoring  Patient Name: Juan Norris is a 73 y.o. male Date: 08/25/2024 Primary Care Physican: [facility] Primary Cardiologist: Freeman Alberts, NP Electrophysiologist: Mealor Bi-V Pacing:   99.6%       02/22/2023 Weight: 151 lbs 06/11/2023 Weight: 150-151 lbs 08/20/2023 Weight: 150 lbs 02/01/2024 Weight: 152 lbs  03/07/2024 Weight: 151 lbs 05/18/2024 Weight: 150 lbs                                                          Spoke with patient and heart failure questions reviewed.  Transmission results reviewed.  Pt asymptomatic for fluid accumulation.  Reports feeling well at this time and voices no complaints.    Pt has brain metastases and has been treated for lung cancer.   Diet:  No changes.    Optivol Thoracic impedance continues suggesting normal fluid levels within the last month with exception of possible dryness starting 8/26.   Prescribed:  Furosemide  20 mg take 1 tablet by mouth daily.  May take 1 additional tablet by mouth daily only as needed for weight gain of 2 lbs in 24 hours or 5 lbs in a week.    Labs: 07/23/2024 Creatinine 0.99, BUN 24, Potassium 4.0, Sodium 137, GFR >60  07/22/2024 Creatinine 1.14, BUN 18, Potassium 3.8, Sodium 135, GFR >60  07/11/2024 Creatinine 1.10, BUN 13, Potassium 4.1, Sodium 139, GFR >60 02/03/2024 Creatinine 0.93, BUN 12, Potassium 4.9, Sodium 145, GFR 87 01/04/2024 Creatinine 1.04, BUN 10, Potassium 4.8, Sodium 137, GFR 76 12/27/2023 Creatinine 1.10, BUN 28, Potassium 5.5, Sodium 139  12/03/2023 Creatinine 1.15, BUN 28, Potassium 4.6, Sodium 135, GFR >60  11/15/2023 Creatinine 1.07, BUN 18, Potassium 4.2, Sodium 138, GFR >60  A complete set of results can be found in Results Review.   Recommendations:   Encouraged to drink up to 64 oz fluid daily to stay hydrated.    Follow-up plan: ICM clinic phone appointment on 10/02/2024.   91 day device clinic remote transmission 11/08/2024.      EP/Cardiology Office  Visits:  Recall 05/12/2025 with Charlies Arthur, PA.     Copy of ICM check sent to Dr. Nancey.    3 month ICM trend: 08/21/2024.    12-14 Month ICM trend:     [name], RN 08/25/2024 9:12 AM

## 2024-08-28 NOTE — Progress Notes (Signed)
Remote ICD Transmission.

## 2024-08-29 ENCOUNTER — Encounter

## 2024-08-29 ENCOUNTER — Telehealth: Payer: Self-pay | Admitting: Internal Medicine

## 2024-08-29 NOTE — Telephone Encounter (Signed)
 Scheduled appointment per 9/11 los. Talked with the patient and he is aware of the made appointment.

## 2024-08-31 ENCOUNTER — Inpatient Hospital Stay (HOSPITAL_BASED_OUTPATIENT_CLINIC_OR_DEPARTMENT_OTHER): Admitting: Internal Medicine

## 2024-08-31 DIAGNOSIS — C7931 Secondary malignant neoplasm of brain: Secondary | ICD-10-CM | POA: Diagnosis not present

## 2024-08-31 MED ORDER — DEXAMETHASONE 1 MG PO TABS: ORAL_TABLET | ORAL | 0 refills | Status: AC

## 2024-08-31 NOTE — Progress Notes (Signed)
 I connected with Juan Norris on 08/31/24 at  3:30 PM EDT by telephone visit and verified that I am speaking with the correct person using two identifiers.  I discussed the limitations, risks, security and privacy concerns of performing an evaluation and management service by telemedicine and the availability of in-person appointments. I also discussed with the patient that there may be a patient responsible charge related to this service. The patient expressed understanding and agreed to proceed.  Other persons participating in the visit and their role in the encounter:  n/a  Patient's location:  Home Provider's location:  Office Chief Complaint:  Metastatic cancer to brain [facility])  History of Present Ilness: Juan Norris reports ongoing improvement in his balance since resuming the decadron  at 4mg  daily once per day.  He has not experienced ill effects from the medication.  No new or progressive changes otherwise.  Observations: Language and cognition at baseline  Assessment and Plan: Metastatic cancer to brain [facility])  Clinically improved.  Recommended decreasing decadron  by 1mg  each week, starting with 3mg  daily tomorrow.  Dose may be modified if focal symptoms recur.  Follow Up Instructions: RTC after next MRI as scheduled  I discussed the assessment and treatment plan with the patient.  The patient was provided an opportunity to ask questions and all were answered.  The patient agreed with the plan and demonstrated understanding of the instructions.    The patient was advised to call back or seek an in-person evaluation if the symptoms worsen or if the condition fails to improve as anticipated.    Lenah Messenger K Leven Hoel, MD   I provided 20 minutes of non face-to-face telephone visit time during this encounter, and > 50% was spent counseling as documented under my assessment & plan.

## 2024-09-01 ENCOUNTER — Inpatient Hospital Stay: Admitting: Licensed Clinical Social Worker

## 2024-09-01 DIAGNOSIS — C7931 Secondary malignant neoplasm of brain: Secondary | ICD-10-CM

## 2024-09-01 NOTE — Progress Notes (Signed)
 [facility] CSW Progress Note  Clinical Child psychotherapist contacted patient by phone to follow-up on transportation concerns.    Interventions: Pt reports he presently does not have a car and has missed appointments due to lack of transportation.  Pt may have transportation benefits through his [employer] managed Medicare.  Pt does not have any appointments presently booked at the cancer center.  Pt provided w/ contact details for CSW to call when the next appointment is booked at the cancer center to have the transportation department research if there are [employer] transportation benefits or to sign pt up for transportation services through the cancer center.        Follow Up Plan:  Patient will contact CSW with any support or resource needs    Devere JONELLE Manna, LCSW Clinical Social Worker [facility]

## 2024-09-06 ENCOUNTER — Telehealth: Payer: Self-pay | Admitting: Internal Medicine

## 2024-09-06 NOTE — Telephone Encounter (Signed)
 Scheduled appointment per staff message. Called and left a VM with appointment details for the patient.

## 2024-10-02 ENCOUNTER — Encounter

## 2024-10-04 NOTE — Progress Notes (Signed)
 No ICM remote transmission received for 10/02/2024 and next ICM transmission scheduled for 10/16/2024.

## 2024-10-16 ENCOUNTER — Ambulatory Visit

## 2024-10-20 ENCOUNTER — Telehealth: Payer: Self-pay

## 2024-10-20 NOTE — Telephone Encounter (Signed)
 ICM Call to patient.  Advised automatic remote transmission was not received this week.  He was not sure why it was not working.  Provided [employer] support number and he will call for assistance regarding monitor.  ICM remote transmission rescheduled for 10/30/2024

## 2024-10-23 ENCOUNTER — Telehealth: Payer: Self-pay

## 2024-10-23 NOTE — Telephone Encounter (Signed)
 Call back to patient and advised the [employer] Tech Support at [phone] is working.  Patient said he has tried call several places such as [employer] and the call disconnects.  Advised to call his mobile phone provider and inform he is not able to make calls on his phone.  He will call today.

## 2024-10-23 NOTE — Telephone Encounter (Signed)
 Returned call to patient as requested by voice mail message.  He stated he has tried several times to reach [employer] number but unable to get through.  He reports the call disconnects after he chooses a number on the recording.  Advised will try the number and call back.

## 2024-10-23 NOTE — Telephone Encounter (Signed)
 Call to [employer] tech support number and no difficulty reaching the patient assistance support line.

## 2024-10-25 ENCOUNTER — Telehealth: Payer: Self-pay

## 2024-10-25 NOTE — Telephone Encounter (Signed)
 Spoke with patient and he reports he spoke with [employer] and will be receiving a new monitor in 7-10 days.  He will send a manual transmission when he gets the monitor.

## 2024-10-30 ENCOUNTER — Ambulatory Visit: Attending: Cardiovascular Disease

## 2024-10-30 DIAGNOSIS — I5042 Chronic combined systolic (congestive) and diastolic (congestive) heart failure: Secondary | ICD-10-CM

## 2024-10-30 DIAGNOSIS — Z95810 Presence of automatic (implantable) cardiac defibrillator: Secondary | ICD-10-CM

## 2024-10-30 NOTE — Progress Notes (Signed)
 EPIC Encounter for ICM Monitoring  Patient Name: Juan Norris is a 73 y.o. male Date: 10/30/2024 Primary Care Physican: [facility] Primary Cardiologist: Freeman Alberts, NP Electrophysiologist: [employer]-V Pacing:   99.5%       02/22/2023 Weight: 151 lbs 06/11/2023 Weight: 150-151 lbs 08/20/2023 Weight: 150 lbs 02/01/2024 Weight: 152 lbs  03/07/2024 Weight: 151 lbs 05/18/2024 Weight: 150 lbs 08/24/2024 Office Visit: 144 lbs  AT/AF  1 Time in AT/AF   <0.1 hr/day (<0.1%) Longest AT/AF   48 minutes                                                          Spoke with patient and heart failure questions reviewed.  Transmission results reviewed.  Pt asymptomatic for fluid accumulation.  He isn't sure what is causing fluid accumulation. Confirmed he is taking all his meds.    Pt has brain metastases and has been treated for lung cancer.   Diet:  No changes.    Optivol Thoracic impedance continues suggesting possible fluid accumulation starting 10/14/2024.   Prescribed:  Furosemide  20 mg take 1 tablet by mouth daily.  May take 1 additional tablet by mouth daily only as needed for weight gain of 2 lbs in 24 hours or 5 lbs in a week.    Labs: 07/23/2024 Creatinine 0.99, BUN 24, Potassium 4.0, Sodium 137, GFR >60  07/22/2024 Creatinine 1.14, BUN 18, Potassium 3.8, Sodium 135, GFR >60  07/11/2024 Creatinine 1.10, BUN 13, Potassium 4.1, Sodium 139, GFR >60 02/03/2024 Creatinine 0.93, BUN 12, Potassium 4.9, Sodium 145, GFR 87 01/04/2024 Creatinine 1.04, BUN 10, Potassium 4.8, Sodium 137, GFR 76 12/27/2023 Creatinine 1.10, BUN 28, Potassium 5.5, Sodium 139  12/03/2023 Creatinine 1.15, BUN 28, Potassium 4.6, Sodium 135, GFR >60  11/15/2023 Creatinine 1.07, BUN 18, Potassium 4.2, Sodium 138, GFR >60  A complete set of results can be found in Results Review.   Recommendations:  Advised to take extra Lasix  20 mg 1 tablet today and tomorrow.    Follow-up plan: ICM clinic phone appointment on  11/01/2024 to recheck fluid levels.   91 day device clinic remote transmission 11/08/2024.      EP/Cardiology Office Visits:  Recall 05/12/2025 with Charlies Arthur, PA.     Copy of ICM check sent to Dr. Nancey.      Remote monitoring is medically necessary for Heart Failure Management.    Daily Thoracic Impedance ICM trend: 07/31/2024 through 10/30/2024.    12-14 Month Thoracic Impedance ICM trend:     [name], RN 10/30/2024 3:32 PM

## 2024-11-01 ENCOUNTER — Ambulatory Visit

## 2024-11-02 NOTE — Progress Notes (Signed)
 No ICM remote transmission received for 11/01/2024 and next ICM transmission scheduled for 11/08/2024.

## 2024-11-08 ENCOUNTER — Ambulatory Visit

## 2024-11-08 NOTE — Progress Notes (Signed)
 No ICM remote transmission received for 11/08/2024 and next ICM transmission scheduled for 12/04/2024.

## 2024-11-16 NOTE — CV Procedure (Signed)
  Device system confirmed to be MRI conditional, with implant date > 6 weeks ago, and no evidence of abandoned or epicardial leads in review of most recent CXR  Device last cleared by EP Provider: Daphne Barrack 11/16/24  Clearance is good through for 1 year as long as parameters remain stable at time of check. If pt undergoes a cardiac device procedure during that time, they should be re-cleared.   Tachy-therapies to be programmed off if applicable with device back to pre-MRI settings after completion of exam.  [employer] - Programming recommendation received through [employer] App/Tablet  Rocky Catalan, RT  11/16/2024 3:52 PM

## 2024-11-17 ENCOUNTER — Ambulatory Visit: Attending: Cardiology

## 2024-11-17 DIAGNOSIS — I428 Other cardiomyopathies: Secondary | ICD-10-CM

## 2024-11-20 LAB — CUP PACEART REMOTE DEVICE CHECK
Battery Remaining Longevity: 7 mo
Battery Voltage: 2.83 V
Brady Statistic AP VP Percent: 7.38 %
Brady Statistic AP VS Percent: 0.01 %
Brady Statistic AS VP Percent: 92.58 %
Brady Statistic AS VS Percent: 0.03 %
Brady Statistic RA Percent Paced: 7.35 %
Brady Statistic RV Percent Paced: 99.63 %
Date Time Interrogation Session: 20251205044223
HighPow Impedance: 57 Ohm
Implantable Lead Connection Status: 753985
Implantable Lead Connection Status: 753985
Implantable Lead Connection Status: 753985
Implantable Lead Implant Date: 20191126
Implantable Lead Implant Date: 20191126
Implantable Lead Implant Date: 20191126
Implantable Lead Location: 753859
Implantable Lead Location: 753860
Implantable Lead Location: 753860
Implantable Lead Model: 3830
Implantable Lead Model: 5076
Implantable Pulse Generator Implant Date: 20191126
Lead Channel Impedance Value: 266 Ohm
Lead Channel Impedance Value: 266 Ohm
Lead Channel Impedance Value: 266 Ohm
Lead Channel Impedance Value: 304 Ohm
Lead Channel Impedance Value: 437 Ohm
Lead Channel Impedance Value: 475 Ohm
Lead Channel Pacing Threshold Amplitude: 0.375 V
Lead Channel Pacing Threshold Amplitude: 0.5 V
Lead Channel Pacing Threshold Amplitude: 0.625 V
Lead Channel Pacing Threshold Pulse Width: 0.4 ms
Lead Channel Pacing Threshold Pulse Width: 0.4 ms
Lead Channel Pacing Threshold Pulse Width: 0.4 ms
Lead Channel Sensing Intrinsic Amplitude: 1.625 mV
Lead Channel Sensing Intrinsic Amplitude: 1.625 mV
Lead Channel Sensing Intrinsic Amplitude: 11.75 mV
Lead Channel Sensing Intrinsic Amplitude: 11.75 mV
Lead Channel Setting Pacing Amplitude: 1.5 V
Lead Channel Setting Pacing Amplitude: 2.5 V
Lead Channel Setting Pacing Amplitude: 2.5 V
Lead Channel Setting Pacing Pulse Width: 0.4 ms
Lead Channel Setting Pacing Pulse Width: 0.4 ms
Lead Channel Setting Sensing Sensitivity: 0.3 mV
Zone Setting Status: 755011
Zone Setting Status: 755011

## 2024-11-21 NOTE — Progress Notes (Signed)
 Remote ICD Transmission

## 2024-11-22 ENCOUNTER — Ambulatory Visit: Payer: Self-pay | Admitting: Cardiology

## 2024-11-23 ENCOUNTER — Other Ambulatory Visit: Payer: Self-pay | Admitting: Internal Medicine

## 2024-11-23 ENCOUNTER — Ambulatory Visit (HOSPITAL_COMMUNITY)
Admission: RE | Admit: 2024-11-23 | Discharge: 2024-11-23 | Disposition: A | Discharge status: Home / Self Care | Source: Ambulatory Visit | Attending: Internal Medicine | Admitting: Internal Medicine

## 2024-11-23 ENCOUNTER — Ambulatory Visit (HOSPITAL_COMMUNITY)
Admission: RE | Admit: 2024-11-23 | Discharge: 2024-11-23 | Disposition: A | Source: Ambulatory Visit | Attending: Internal Medicine

## 2024-11-23 DIAGNOSIS — C7931 Secondary malignant neoplasm of brain: Secondary | ICD-10-CM | POA: Insufficient documentation

## 2024-11-23 MED ORDER — GADOBUTROL 1 MMOL/ML IV SOLN
6.5000 mL | Freq: Once | INTRAVENOUS | Status: AC | PRN
  Administered 2024-11-23: 6.5 mL via INTRAVENOUS

## 2024-11-23 NOTE — Progress Notes (Signed)
 Patient was monitored by this RN during MRI scan due to presence of a pacemaker. Cardiac rhythm was continuously monitored throughout the procedure. Prior to the start of the scan, the pacemaker was placed in MRI-safe mode by the MRI technician and/or pacemaker representative. Following the completion of the scan, the device was returned to its pre-MRI settings. Neurological status and orientation post-procedure were unchanged from baseline.   Pre-procedure Heart Rate (Prior to being placed in MRI safe mode): 65 Post-procedure Heart Rate (Once pacemaker is returned to baseline mode): 65

## 2024-11-27 ENCOUNTER — Inpatient Hospital Stay

## 2024-11-27 ENCOUNTER — Inpatient Hospital Stay: Attending: Radiation Oncology

## 2024-11-30 ENCOUNTER — Inpatient Hospital Stay: Admitting: Internal Medicine

## 2024-12-04 ENCOUNTER — Ambulatory Visit: Attending: Cardiovascular Disease

## 2024-12-04 DIAGNOSIS — Z95810 Presence of automatic (implantable) cardiac defibrillator: Secondary | ICD-10-CM | POA: Diagnosis not present

## 2024-12-04 DIAGNOSIS — I5042 Chronic combined systolic (congestive) and diastolic (congestive) heart failure: Secondary | ICD-10-CM

## 2024-12-04 NOTE — Progress Notes (Signed)
 EPIC Encounter for ICM Monitoring  Patient Name: Juan Norris is a 73 y.o. male Date: 12/04/2024 Primary Care Physican: [facility] Primary Cardiologist: Freeman Alberts, NP Electrophysiologist: [employer]-V Pacing:   99.7%       02/22/2023 Weight: 151 lbs 06/11/2023 Weight: 150-151 lbs 08/20/2023 Weight: 150 lbs 02/01/2024 Weight: 152 lbs  03/07/2024 Weight: 151 lbs 05/18/2024 Weight: 150 lbs 08/24/2024 Office Visit: 144 lbs  Clinical Status (17-Nov-2024 to 04-Dec-2024) Time in AT/AF   0.0 hr/day (0.0%)                                                          Spoke with patient and heart failure questions reviewed.  Transmission results reviewed.  Pt asymptomatic for fluid accumulation.  Takes extra Lasix  when needed.   Diet:  No changes.    Since 10/30/2024 ICM Remote Transmission: Optivol Thoracic impedance continues suggesting intermittent days with possible fluid accumulation.   Prescribed:  Furosemide  20 mg take 1 tablet by mouth daily.  May take 1 additional tablet by mouth daily only as needed for weight gain of 2 lbs in 24 hours or 5 lbs in a week.    Labs: 07/23/2024 Creatinine 0.99, BUN 24, Potassium 4.0, Sodium 137, GFR >60  07/22/2024 Creatinine 1.14, BUN 18, Potassium 3.8, Sodium 135, GFR >60  07/11/2024 Creatinine 1.10, BUN 13, Potassium 4.1, Sodium 139, GFR >60 02/03/2024 Creatinine 0.93, BUN 12, Potassium 4.9, Sodium 145, GFR 87 01/04/2024 Creatinine 1.04, BUN 10, Potassium 4.8, Sodium 137, GFR 76 12/27/2023 Creatinine 1.10, BUN 28, Potassium 5.5, Sodium 139  12/03/2023 Creatinine 1.15, BUN 28, Potassium 4.6, Sodium 135, GFR >60  11/15/2023 Creatinine 1.07, BUN 18, Potassium 4.2, Sodium 138, GFR >60  A complete set of results can be found in Results Review.   Recommendations:  No changes and encouraged to call if experiencing any fluid symptoms.   Follow-up plan: ICM clinic phone appointment on 01/08/2025.   91 day device clinic remote transmission 02/16/2025.       EP/Cardiology Office Visits:  Recall 05/12/2025 with Charlies Arthur, PA.     Copy of ICM check sent to Dr. Nancey.    Remote monitoring is medically necessary for Heart Failure Management.    Daily Thoracic Impedance ICM trend: 09/04/2024 through 12/04/2024.    12-14 Month Thoracic Impedance ICM trend:     [name], RN 12/04/2024 4:10 PM

## 2024-12-04 NOTE — Progress Notes (Signed)
 Rad Onc note from 8/20 and NeuroOnc notes from 9/11 and 9/18 faxed to Alfonso Pap, [place] [facility].

## 2024-12-11 ENCOUNTER — Inpatient Hospital Stay: Admitting: Internal Medicine

## 2024-12-11 ENCOUNTER — Other Ambulatory Visit: Payer: Self-pay | Admitting: Radiation Therapy

## 2024-12-11 ENCOUNTER — Telehealth: Payer: Self-pay

## 2024-12-11 ENCOUNTER — Other Ambulatory Visit: Payer: Self-pay

## 2024-12-11 DIAGNOSIS — C7931 Secondary malignant neoplasm of brain: Secondary | ICD-10-CM

## 2024-12-11 NOTE — Progress Notes (Signed)
 Called patient due to not showing up for his 1130 app with Dr. Buckley. No answer to call left a detailed message to contact us  to reschedule. Will no show patient to his appointments today.

## 2024-12-11 NOTE — Telephone Encounter (Signed)
 Attempted return call to patient.  Left voice mail message stating I verified [employer] has received his old monitor and no further action is needed.  Advised to call back if he has any questions.

## 2024-12-11 NOTE — Telephone Encounter (Signed)
 Received voice mail message from patient stating he received text from [employer] saying he old monitor has not been received.  He stated he mailed it 2 weeks ago.

## 2024-12-11 NOTE — Telephone Encounter (Signed)
 Call to [employer] Tech support and verified the old monitor has been received and patient should not get any further communication about the old monitor.

## 2025-01-04 ENCOUNTER — Inpatient Hospital Stay: Attending: Radiation Oncology | Admitting: Internal Medicine

## 2025-01-04 VITALS — BP 122/62 | HR 75 | Temp 97.5°F | Resp 20 | Wt 147.5 lb

## 2025-01-04 DIAGNOSIS — C7931 Secondary malignant neoplasm of brain: Secondary | ICD-10-CM

## 2025-01-04 NOTE — Progress Notes (Signed)
 "  [facility] [address]  [place], [place] [address] [phone]   Interval Evaluation  Date of Service: 01/04/25 Patient Name: Juan Norris Patient MRN: [id number] Patient DOB: 05/05/1951 Provider: Arthea MARLA Manns, MD  Identifying Statement:  Juan Norris is a 74 y.o. male with Metastatic cancer to brain [facility])    Primary Cancer:  Oncologic History: Oncology History  Malignant neoplasm of lower lobe of right lung (HCC)  11/12/2023 Initial Diagnosis   Malignant neoplasm of lower lobe of right lung (HCC)   12/03/2023 Cancer Staging   Staging form: Lung, AJCC 8th Edition - Clinical: Stage IVB (cTX, cNX, cM1c) - Signed by Autumn Millman, MD on 12/03/2023   12/16/2023 - 12/16/2023 Chemotherapy   Patient is on Treatment Plan : LUNG NSCLC Carboplatin (6) + Paclitaxel (200) + Pembrolizumab (200) D1 q21d x 4 cycles / Pembrolizumab (200) Maintenance D1 q21d     Metastatic cancer to brain (HCC)  11/13/2023 Initial Diagnosis   Metastatic cancer to brain (HCC)   12/16/2023 - 12/16/2023 Chemotherapy   Patient is on Treatment Plan : LUNG NSCLC Carboplatin (6) + Paclitaxel (200) + Pembrolizumab (200) D1 q21d x 4 cycles / Pembrolizumab (200) Maintenance D1 q21d     Primary cancer of left upper lobe of lung (HCC)  01/07/2024 Initial Diagnosis   Primary cancer of left upper lobe of lung (HCC)   01/07/2024 Cancer Staging   Staging form: Lung, AJCC V9 - Clinical stage from 01/07/2024: Stage IA2 (cT1b, cN0, cM0) - Signed by Patrcia Cough, MD on 01/07/2024 Histopathologic type: Adenocarcinoma, NOS Stage prefix: Initial diagnosis     CNS Oncologic History 11/24/23: Completes 3 fraction SRS to 2 tagets, including large R cerebellar hemisphere  Interval History: Juan Norris presents today for follow up after obtaining MRI study last month.  He denies new or progressive changes.  Walking independently without balance impairment or assistance.  Right hand  function is normal.  Remains off all steroids.  Continues to follow with oncology at the [facility], on observation for lung cancer.  H+P (08/31/24): Patient presents today to establish care given recent neurologic symptoms.  He describes recurrence of wobbly gait and double vision.  Timing is following running out of medication that had been prescribed by inpatient team.  While on the steroids, he was able to walk without much trouble and rarely had double vision.  [facility]    Medications: Current Outpatient Medications on File Prior to Visit  Medication Sig Dispense Refill   aspirin  81 MG EC tablet Take 1 tablet (81 mg total) by mouth daily. 30 tablet 6   atorvastatin  (LIPITOR ) 40 MG tablet Take 1 tablet (40 mg total) by mouth at bedtime.     carvedilol  (COREG ) 25 MG tablet Take 25 mg by mouth daily.     Empagliflozin-metFORMIN  HCl 12.5-500 MG TABS Take 1 tablet by mouth daily.     ferrous sulfate 325 (65 FE) MG tablet Take 325 mg by mouth daily with breakfast.     furosemide  (LASIX ) 20 MG tablet Take 1 tablet (20 mg total) by mouth daily. As directed may take 1 additional tablet by mouth daily only as needed for weight gain of 2 lbs in 24 hours or 5 lbs in a week 120 tablet 3   Multiple Vitamin (MULTIVITAMIN WITH MINERALS) TABS tablet Take 1 tablet by mouth daily.     nicotine  (NICODERM CQ  - DOSED IN  MG/24 HOURS) 21 mg/24hr patch Place 21 mg onto the skin daily.     sacubitril -valsartan  (ENTRESTO ) 24-26 MG Take 1 tablet by mouth 2 (two) times daily.     sildenafil (VIAGRA) 100 MG tablet Take 100 mg by mouth as needed for erectile dysfunction.     No current facility-administered medications on file prior to visit.    Allergies: No Known Allergies Past Medical History:  Past Medical History:  Diagnosis Date   Acute renal insufficiency    AICD (automatic cardioverter/defibrillator) present    Anginal pain    CHF (congestive heart failure) (HCC)    Chronic  lower back pain    due to 3 ruptured discs in my back (11/08/2018)   Diabetes mellitus without complication (HCC)    High cholesterol    High cholesterol    History of kidney stones 1982   passed them   History of radiation therapy    Right lung- 11/03/21-11/14/21- Dr. Lynwood Nay   Hypertension    Lung cancer [facility])    Myocardial infarction (HCC) 03/2018   Systolic heart failure (HCC)    Tobacco use    Past Surgical History:  Past Surgical History:  Procedure Laterality Date   BIV ICD INSERTION CRT-D N/A 11/08/2018    Medtronic biventricular ICD implantation by Dr Kelsie with His Bundle pacing and a MDT 3830 lead placed into the LV port.    BRONCHIAL BIOPSY  10/13/2021   Procedure: BRONCHIAL BIOPSIES;  Surgeon: Shelah Lamar RAMAN, MD;  Location: [facility] ENDOSCOPY;  Service: Pulmonary;;   BRONCHIAL BIOPSY  12/27/2023   Procedure: BRONCHIAL BIOPSIES;  Surgeon: Shelah Lamar RAMAN, MD;  Location: [facility] ENDOSCOPY;  Service: Pulmonary;;   BRONCHIAL BRUSHINGS  10/13/2021   Procedure: BRONCHIAL BRUSHINGS;  Surgeon: Shelah Lamar RAMAN, MD;  Location: [facility] ENDOSCOPY;  Service: Pulmonary;;   BRONCHIAL BRUSHINGS  12/27/2023   Procedure: BRONCHIAL BRUSHINGS;  Surgeon: Shelah Lamar RAMAN, MD;  Location: [facility] ENDOSCOPY;  Service: Pulmonary;;   BRONCHIAL NEEDLE ASPIRATION BIOPSY  10/13/2021   Procedure: BRONCHIAL NEEDLE ASPIRATION BIOPSIES;  Surgeon: Shelah Lamar RAMAN, MD;  Location: [facility] ENDOSCOPY;  Service: Pulmonary;;   BRONCHIAL NEEDLE ASPIRATION BIOPSY  12/27/2023   Procedure: BRONCHIAL NEEDLE ASPIRATION BIOPSIES;  Surgeon: Shelah Lamar RAMAN, MD;  Location: [facility] ENDOSCOPY;  Service: Pulmonary;;   BRONCHIAL WASHINGS  10/13/2021   Procedure: BRONCHIAL WASHINGS;  Surgeon: Shelah Lamar RAMAN, MD;  Location: [facility] ENDOSCOPY;  Service: Pulmonary;;   CARDIAC CATHETERIZATION     FIDUCIAL MARKER PLACEMENT  10/13/2021   Procedure: FIDUCIAL MARKER PLACEMENT;  Surgeon: Shelah Lamar RAMAN, MD;  Location: [facility] ENDOSCOPY;  Service: Pulmonary;;    FIDUCIAL MARKER PLACEMENT  12/27/2023   Procedure: FIDUCIAL MARKER PLACEMENT;  Surgeon: Shelah Lamar RAMAN, MD;  Location: [facility] ENDOSCOPY;  Service: Pulmonary;;   FINE NEEDLE ASPIRATION BIOPSY  07/24/2024   Procedure: FINE NEEDLE ASPIRATION BIOPSY;  Surgeon: Shelah Lamar RAMAN, MD;  Location: [facility] ENDOSCOPY;  Service: Pulmonary;;   INGUINAL HERNIA REPAIR Bilateral    IR IMAGING GUIDED PORT INSERTION  11/15/2023   KNEE SURGERY Left 1990s   growth on the back of my knee removed   LEFT HEART CATH AND CORONARY ANGIOGRAPHY N/A 03/30/2018   Procedure: LEFT HEART CATH AND CORONARY ANGIOGRAPHY;  Surgeon: Verlin Lonni BIRCH, MD;  Location: [facility] INVASIVE CV LAB;  Service: Cardiovascular;  Laterality: N/A;   VIDEO BRONCHOSCOPY WITH ENDOBRONCHIAL NAVIGATION N/A 10/13/2021   Procedure: ROBOTIC VIDEO BRONCHOSCOPY WITH ENDOBRONCHIAL NAVIGATION;  Surgeon: Shelah Lamar RAMAN, MD;  Location: [facility]  ENDOSCOPY;  Service: Pulmonary;  Laterality: N/A;   VIDEO BRONCHOSCOPY WITH ENDOBRONCHIAL NAVIGATION Bilateral 07/24/2024   Procedure: VIDEO BRONCHOSCOPY WITH ENDOBRONCHIAL NAVIGATION;  Surgeon: Shelah Lamar RAMAN, MD;  Location: [facility] ENDOSCOPY;  Service: Pulmonary;  Laterality: Bilateral;   VIDEO BRONCHOSCOPY WITH ENDOBRONCHIAL ULTRASOUND  07/24/2024   Procedure: BRONCHOSCOPY, WITH EBUS;  Surgeon: Shelah Lamar RAMAN, MD;  Location: [facility] ENDOSCOPY;  Service: Pulmonary;;   VIDEO BRONCHOSCOPY WITH RADIAL ENDOBRONCHIAL ULTRASOUND  10/13/2021   Procedure: RADIAL ENDOBRONCHIAL ULTRASOUND;  Surgeon: Shelah Lamar RAMAN, MD;  Location: [facility] ENDOSCOPY;  Service: Pulmonary;;   Social History:  Social History   Socioeconomic History   Marital status: Divorced    Spouse name: Not on file   Number of children: 1   Years of education: Not on file   Highest education level: Not on file  Occupational History   Not on file  Tobacco Use   Smoking status: Former    Current packs/day: 1.50    Average packs/day: 1.5 packs/day for 47.0 years (70.5 ttl  pk-yrs)    Types: Cigarettes   Smokeless tobacco: Former   Tobacco comments:    Pt smokes 1 cig weekly when something bothers him. AB, CMA 07-10-24        1 cigarette smoked daily 05/28/22 ARJ   Vaping Use   Vaping status: Never Used  Substance and Sexual Activity   Alcohol use: Not Currently    Comment: 11/08/2018 stopped 09/23/1998   Drug use: Not Currently    Comment: 11/08/2018 nothing in the 2000s   Sexual activity: Yes  Other Topics Concern   Not on file  Social History Narrative   Retired   Social Drivers of Health   Tobacco Use: Medium Risk (08/15/2024)   Patient History    Smoking Tobacco Use: Former    Smokeless Tobacco Use: Former    Passive Exposure: Not on Stage Manager: Not on file  Food Insecurity: No Food Insecurity (07/22/2024)   Epic    Worried About Programme Researcher, Broadcasting/film/video in the Last Year: Never true    [employer] of Food in the Last Year: Never true  Transportation Needs: Unmet Transportation Needs (08/24/2024)   Epic    Lack of Transportation (Medical): Yes    Lack of Transportation (Non-Medical): Yes  Physical Activity: Not on file  Stress: Not on file  Social Connections: Socially Integrated (07/22/2024)   Social Connection and Isolation Panel    Frequency of Communication with Friends and Family: Three times a week    Frequency of Social Gatherings with Friends and Family: Three times a week    Attends Religious Services: More than 4 times per year    Active Member of Clubs or Organizations: No    Attends Banker Meetings: 1 to 4 times per year    Marital Status: Married  Catering Manager Violence: Not At Risk (07/22/2024)   Epic    Fear of Current or Ex-Partner: No    Emotionally Abused: No    Physically Abused: No    Sexually Abused: No  Depression (PHQ2-9): Low Risk (08/24/2024)   Depression (PHQ2-9)    PHQ-2 Score: 0  Alcohol Screen: Not on file  Housing: Low Risk (07/22/2024)   Epic    Unable to Pay for Housing in  the Last Year: No    Number of Times Moved in the Last Year: 0    Homeless in the Last Year: No  Utilities: Not At Risk (07/22/2024)  Epic    Threatened with loss of utilities: No  Health Literacy: Not on file   Family History:  Family History  Problem Relation Age of Onset   Leukemia Mother    Cancer Father    Cancer Sister    Cancer Brother    CAD Brother     Review of Systems: Constitutional: Doesn't report fevers, chills or abnormal weight loss Eyes: Doesn't report blurriness of vision Ears, nose, mouth, throat, and face: Doesn't report sore throat Respiratory: Doesn't report cough, dyspnea or wheezes Cardiovascular: Doesn't report palpitation, chest discomfort  Gastrointestinal:  Doesn't report nausea, constipation, diarrhea GU: Doesn't report incontinence Skin: Doesn't report skin rashes Neurological: Per HPI Musculoskeletal: Doesn't report joint pain Behavioral/Psych: Doesn't report anxiety  Physical Exam: Vitals:   01/04/25 1100  BP: 122/62  Pulse: 75  Resp: 20  Temp: (!) 97.5 F (36.4 C)  SpO2: 97%   KPS: 80. General: Alert, cooperative, pleasant, in no acute distress Head: Normal EENT: No conjunctival injection or scleral icterus.  Lungs: Resp effort normal Cardiac: Regular rate Abdomen: Non-distended abdomen Skin: No rashes cyanosis or petechiae. Extremities: No clubbing or edema  Neurologic Exam: Mental Status: Awake, alert, attentive to examiner. Oriented to self and environment. Language is fluent with intact comprehension.  Cranial Nerves: Visual acuity is grossly normal. Visual fields are full. Extra-ocular movements intact. No ptosis. Face is symmetric Motor: Tone and bulk are normal. Power is full in both arms and legs. Reflexes are symmetric, no pathologic reflexes present.  Sensory: Intact to light touch Gait: Normal.   Labs: I have reviewed the data as listed    Component Value Date/Time   NA 137 07/23/2024 0443   NA 145 (H)  02/03/2024 1335   K 4.0 07/23/2024 0443   CL 108 07/23/2024 0443   CO2 19 (L) 07/23/2024 0443   GLUCOSE 167 (H) 07/23/2024 0443   BUN 24 (H) 07/23/2024 0443   BUN 12 02/03/2024 1335   CREATININE 0.99 07/23/2024 0443   CALCIUM  9.0 07/23/2024 0443   PROT 6.6 07/22/2024 2007   PROT 7.1 02/12/2021 0802   ALBUMIN 4.1 07/22/2024 2007   ALBUMIN 4.1 02/12/2021 0802   AST 24 07/22/2024 2007   ALT 19 07/22/2024 2007   ALKPHOS 76 07/22/2024 2007   BILITOT 1.0 07/22/2024 2007   BILITOT 0.5 02/12/2021 0802   GFRNONAA >60 07/23/2024 0443   GFRAA 91 09/04/2019 1028   Lab Results  Component Value Date   WBC 9.2 07/22/2024   NEUTROABS 6.6 07/22/2024   HGB 14.1 07/22/2024   HCT 43.9 07/22/2024   MCV 90.3 07/22/2024   PLT 205 07/22/2024    Imaging:  [facility] Clinician Interpretation: I have personally reviewed the CNS images as listed.  My interpretation, in the context of the patient's clinical presentation, is treatment effect vs true progression  EXAM: MRI BRAIN WITH AND WITHOUT CONTRAST 11/23/2024 03:07:54 PM   TECHNIQUE: Multiplanar multisequence MRI of the head/brain was performed with and without the administration of intravenous contrast.   COMPARISON: MRI head 07/18/2024 and earlier.   CLINICAL HISTORY: Brain/CNS neoplasm, assess treatment response.   FINDINGS:   BRAIN AND VENTRICLES: Redemonstration of 2 intracranial metastatic lesions. Dominant lesion in the right cerebellum measures 3.1 x 3.0 x 3.1 cm, previously measuring 3.0 x 2.4 x 2.8 cm. Edema within the right cerebellum is overall similar to prior. Similar partial effacement of the 4th ventricle. Increased susceptibility along the periphery of this lesion compared to prior. Lesion in the left  occipital lobe measures 2.0 x 1.7 x 1.9 cm, previously measuring 2.0 x 1.0 x 1.8 cm when remeasured in a similar manner. Change in size of this lesion is most apparent by comparing series 16 image 77 of the current study  with series 16 image 82 of the prior study. There is increased edema within the left occipital lobe noted when comparing series 15 image 7 of the current study with series 15 image 11 of the prior study. There is slightly increased gyral expansion in this region. Small remote infarct in the left cerebellum. Similar appearance of white matter hyperintensities favored to reflect chronic microvascular ischemic changes. Similar chronic microhemorrhage in the posterior left cerebral hemisphere. No acute infarct. No acute intracranial hemorrhage. No mass effect or midline shift. No hydrocephalus. The sella is unremarkable. Normal flow voids. No new intracranial lesions noted.   ORBITS: No acute abnormality.   SINUSES: Mucosal thickening in the bilateral maxillary sinuses.   BONES AND SOFT TISSUES: Normal bone marrow signal and enhancement. There is a focus of restricted diffusion along the left aspect of the nasopharynx measuring approximately 12 mm in size which could reflect a nasopharyngeal lesion versus retropharyngeal lymph node seen on series 6 image 1 and series 7 image 2. Recommend contrast enhanced CT of the neck for further evaluation. No acute soft tissue abnormality.   IMPRESSION: 1. Two intracranial metastatic lesions in the right cerebellum and left occipital lobe with interval growth of both lesions compared to prior, increased peripheral susceptibility of the right cerebellar lesion, and increased edema in the left occipital lobe. 2. No new intracranial lesions. 3. Focus of restricted diffusion along the left nasopharynx that may represent a nasopharyngeal lesion or retropharyngeal lymph node. Recommend contrast-enhanced CT of the neck for further evaluation.   Electronically signed by: Donnice Mania MD 11/23/2024 08:40 PM EST RP   Assessment/Plan Metastatic cancer to brain [facility])  Nadara SAUNDERS Stiehl is clinically stable today.  MRI brain demonstrates progressive changes  within two treated metastases, primarily within right cerebellar hemisphere.  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  Case was discussed in CNS tumor board several weeks ago, recommended continuing imaging surveillance with repeat MRI brain in February.  Will hold off on resuming steroids at this time because of lack of focality.  He is agreeable with this.  [facility] [place] [facility].  We appreciate the opportunity to participate in the care of [employer].   We ask that DEMONTREZ RINDFLEISCH return to clinic in 1 months following next brain MRI, or sooner as needed.    All questions were answered. The patient knows to call the clinic with any problems, questions or concerns. No barriers to learning were detected.  The total time spent in the encounter was 40 minutes and more than 50% was on counseling and review of test results   Arthea MARLA Manns, MD Medical Director of Neuro-Oncology [facility] at [place] 01/04/25 10:55 AM "

## 2025-01-08 ENCOUNTER — Ambulatory Visit: Attending: Cardiovascular Disease

## 2025-01-08 DIAGNOSIS — I5042 Chronic combined systolic (congestive) and diastolic (congestive) heart failure: Secondary | ICD-10-CM | POA: Diagnosis not present

## 2025-01-08 DIAGNOSIS — Z95810 Presence of automatic (implantable) cardiac defibrillator: Secondary | ICD-10-CM

## 2025-01-09 ENCOUNTER — Telehealth: Payer: Self-pay | Admitting: Internal Medicine

## 2025-01-09 NOTE — Telephone Encounter (Signed)
 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

## 2025-01-10 ENCOUNTER — Other Ambulatory Visit (HOSPITAL_COMMUNITY): Payer: Self-pay | Admitting: Internal Medicine

## 2025-01-10 DIAGNOSIS — D0222 Carcinoma in situ of left bronchus and lung: Secondary | ICD-10-CM

## 2025-01-10 NOTE — Progress Notes (Signed)
 EPIC Encounter for ICM Monitoring  Patient Name: Juan Norris is a 74 y.o. male Date: 01/10/2025 Primary Care Physican: Clinic, Juan Norris Primary Cardiologist: Juan Alberts, NP Electrophysiologist: Juan Norris:   99.7%       02/22/2023 Weight: 151 lbs 06/11/2023 Weight: 150-151 lbs 08/20/2023 Weight: 150 lbs 02/01/2024 Weight: 152 lbs  03/07/2024 Weight: 151 lbs 05/18/2024 Weight: 150 lbs 08/24/2024 Office Visit: 144 lbs   Clinical Status Since 04-Dec-2024 Time in AT/AF   0.0 hr/day (0.0%)  Battery Longevity: 4 months                                                          Spoke with patient and heart failure questions reviewed.  Transmission results reviewed.  Pt asymptomatic for fluid accumulation.  Takes extra Lasix  when needed.   Diet:  No changes.    Since 12/04/2024 ICM Remote Transmission: Optivol Thoracic impedance continues suggesting normal fluid levels with the exception of possible fluid accumulation from 12/10/2024-12/21/2024.   Prescribed:  Furosemide  20 mg take 1 tablet by mouth daily.  May take 1 additional tablet by mouth daily only as needed for weight gain of 2 lbs in 24 hours or 5 lbs in a week.    Labs: 07/23/2024 Creatinine 0.99, BUN 24, Potassium 4.0, Sodium 137, GFR >60  07/22/2024 Creatinine 1.14, BUN 18, Potassium 3.8, Sodium 135, GFR >60  07/11/2024 Creatinine 1.10, BUN 13, Potassium 4.1, Sodium 139, GFR >60 02/03/2024 Creatinine 0.93, BUN 12, Potassium 4.9, Sodium 145, GFR 87 01/04/2024 Creatinine 1.04, BUN 10, Potassium 4.8, Sodium 137, GFR 76 12/27/2023 Creatinine 1.10, BUN 28, Potassium 5.5, Sodium 139  12/03/2023 Creatinine 1.15, BUN 28, Potassium 4.6, Sodium 135, GFR >60  11/15/2023 Creatinine 1.07, BUN 18, Potassium 4.2, Sodium 138, GFR >60  A complete set of results can be found in Results Review.   Recommendations:  Advised to call physicians office for any changes in condition.     Follow-up plan: ICM clinic 31 day follow up currently  on hold but 91 day remote monitoring will continue.      91 day device clinic remote transmission 02/16/2025.      EP/Cardiology Office Visits:  Recall 05/12/2025 with Juan Arthur, PA.      Copy of ICM check sent to Dr. Nancey.     Remote monitoring is medically necessary for Heart Failure Management.    Daily Thoracic Impedance ICM trend: 10/09/2024 through 01/11/2025.    12-14 Month Thoracic Impedance ICM trend:     Juan GORMAN Garner, RN 01/10/2025 1:00 PM

## 2025-01-17 ENCOUNTER — Telehealth: Payer: Self-pay | Admitting: Radiation Oncology

## 2025-01-17 NOTE — Telephone Encounter (Signed)
"  Sent unable to contact letter  "

## 2025-01-29 ENCOUNTER — Inpatient Hospital Stay: Attending: Radiation Oncology

## 2025-01-31 ENCOUNTER — Ambulatory Visit: Admitting: Urology

## 2025-02-08 ENCOUNTER — Ambulatory Visit

## 2025-02-08 ENCOUNTER — Encounter

## 2025-02-16 ENCOUNTER — Ambulatory Visit

## 2025-02-27 ENCOUNTER — Other Ambulatory Visit (HOSPITAL_COMMUNITY)

## 2025-03-19 ENCOUNTER — Ambulatory Visit

## 2025-04-19 ENCOUNTER — Ambulatory Visit

## 2025-05-10 ENCOUNTER — Encounter

## 2025-05-18 ENCOUNTER — Ambulatory Visit

## 2025-08-09 ENCOUNTER — Encounter

## 2025-08-17 ENCOUNTER — Ambulatory Visit

## 2025-11-12 ENCOUNTER — Encounter
